# Patient Record
Sex: Male | Born: 1970
Health system: Southern US, Community
[De-identification: ages and names within clinical notes are randomized; demographics above are authoritative.]

## PROBLEM LIST (undated history)

## (undated) VITALS — BP 133/86 | HR 87 | Temp 98.0°F | Resp 18 | Ht 65.0 in | Wt 156.0 lb

## (undated) DIAGNOSIS — E785 Hyperlipidemia, unspecified: Secondary | ICD-10-CM

## (undated) DIAGNOSIS — F1010 Alcohol abuse, uncomplicated: Secondary | ICD-10-CM

## (undated) DIAGNOSIS — I255 Ischemic cardiomyopathy: Secondary | ICD-10-CM

## (undated) DIAGNOSIS — N183 Chronic kidney disease, stage 3 unspecified: Secondary | ICD-10-CM

## (undated) DIAGNOSIS — I2510 Atherosclerotic heart disease of native coronary artery without angina pectoris: Secondary | ICD-10-CM

## (undated) DIAGNOSIS — Z91199 Patient's noncompliance with other medical treatment and regimen due to unspecified reason: Secondary | ICD-10-CM

## (undated) DIAGNOSIS — Z9119 Patient's noncompliance with other medical treatment and regimen: Secondary | ICD-10-CM

## (undated) DIAGNOSIS — R4781 Slurred speech: Secondary | ICD-10-CM

## (undated) DIAGNOSIS — E119 Type 2 diabetes mellitus without complications: Secondary | ICD-10-CM

## (undated) DIAGNOSIS — I10 Essential (primary) hypertension: Secondary | ICD-10-CM

## (undated) HISTORY — DX: Atherosclerotic heart disease of native coronary artery without angina pectoris: I25.10

## (undated) HISTORY — DX: Chronic kidney disease, stage 3 (moderate): N18.3

## (undated) HISTORY — DX: Slurred speech: R47.81

## (undated) HISTORY — DX: Hyperlipidemia, unspecified: E78.5

## (undated) HISTORY — DX: Essential (primary) hypertension: I10

## (undated) HISTORY — DX: Type 2 diabetes mellitus without complications: E11.9

## (undated) HISTORY — DX: Patient's noncompliance with other medical treatment and regimen: Z91.19

## (undated) HISTORY — DX: Chronic kidney disease, stage 3 unspecified: N18.30

## (undated) HISTORY — DX: Patient's noncompliance with other medical treatment and regimen due to unspecified reason: Z91.199

## (undated) HISTORY — PX: OTHER SURGICAL HISTORY: SHX169

---

## 2004-05-08 ENCOUNTER — Ambulatory Visit: Payer: Self-pay | Admitting: Family Medicine

## 2004-08-17 ENCOUNTER — Ambulatory Visit: Payer: Self-pay | Admitting: Family Medicine

## 2004-08-24 ENCOUNTER — Ambulatory Visit: Payer: Self-pay | Admitting: Family Medicine

## 2005-06-04 ENCOUNTER — Ambulatory Visit: Payer: Self-pay | Admitting: Family Medicine

## 2005-12-26 ENCOUNTER — Ambulatory Visit: Payer: Self-pay | Admitting: Family Medicine

## 2009-01-31 ENCOUNTER — Ambulatory Visit: Payer: Self-pay | Admitting: Cardiovascular Disease

## 2009-01-31 ENCOUNTER — Ambulatory Visit: Payer: Self-pay | Admitting: Cardiology

## 2009-01-31 ENCOUNTER — Inpatient Hospital Stay (HOSPITAL_COMMUNITY): Admission: AD | Admit: 2009-01-31 | Discharge: 2009-02-03 | Payer: Self-pay | Admitting: Cardiovascular Disease

## 2009-02-01 ENCOUNTER — Encounter: Payer: Self-pay | Admitting: Cardiovascular Disease

## 2009-02-04 ENCOUNTER — Telehealth: Payer: Self-pay | Admitting: Cardiology

## 2009-02-21 DIAGNOSIS — E1159 Type 2 diabetes mellitus with other circulatory complications: Secondary | ICD-10-CM | POA: Insufficient documentation

## 2009-02-21 DIAGNOSIS — IMO0002 Reserved for concepts with insufficient information to code with codable children: Secondary | ICD-10-CM | POA: Insufficient documentation

## 2009-02-21 DIAGNOSIS — E1165 Type 2 diabetes mellitus with hyperglycemia: Secondary | ICD-10-CM

## 2009-02-21 DIAGNOSIS — I10 Essential (primary) hypertension: Secondary | ICD-10-CM | POA: Insufficient documentation

## 2009-02-22 ENCOUNTER — Ambulatory Visit: Payer: Self-pay | Admitting: Cardiology

## 2009-02-22 DIAGNOSIS — E785 Hyperlipidemia, unspecified: Secondary | ICD-10-CM | POA: Insufficient documentation

## 2009-02-22 DIAGNOSIS — I2510 Atherosclerotic heart disease of native coronary artery without angina pectoris: Secondary | ICD-10-CM | POA: Insufficient documentation

## 2009-02-22 DIAGNOSIS — I25119 Atherosclerotic heart disease of native coronary artery with unspecified angina pectoris: Secondary | ICD-10-CM | POA: Insufficient documentation

## 2009-05-25 ENCOUNTER — Ambulatory Visit: Payer: Self-pay | Admitting: Cardiology

## 2009-05-27 ENCOUNTER — Ambulatory Visit: Payer: Self-pay | Admitting: Cardiology

## 2009-05-27 ENCOUNTER — Encounter: Payer: Self-pay | Admitting: Cardiology

## 2009-05-31 ENCOUNTER — Telehealth: Payer: Self-pay | Admitting: Cardiology

## 2009-09-09 ENCOUNTER — Encounter (INDEPENDENT_AMBULATORY_CARE_PROVIDER_SITE_OTHER): Payer: Self-pay | Admitting: *Deleted

## 2009-09-16 ENCOUNTER — Emergency Department (HOSPITAL_COMMUNITY): Admission: EM | Admit: 2009-09-16 | Discharge: 2009-09-16 | Payer: Self-pay | Admitting: Emergency Medicine

## 2009-10-04 ENCOUNTER — Encounter: Admission: RE | Admit: 2009-10-04 | Discharge: 2009-10-10 | Payer: Self-pay | Admitting: Orthopaedic Surgery

## 2009-10-05 ENCOUNTER — Encounter: Payer: Self-pay | Admitting: Cardiology

## 2009-10-05 ENCOUNTER — Ambulatory Visit: Payer: Self-pay | Admitting: Cardiology

## 2009-10-07 ENCOUNTER — Ambulatory Visit: Payer: Self-pay | Admitting: Cardiology

## 2009-10-07 ENCOUNTER — Encounter: Payer: Self-pay | Admitting: Cardiology

## 2009-10-10 ENCOUNTER — Telehealth: Payer: Self-pay | Admitting: Cardiology

## 2009-10-13 ENCOUNTER — Telehealth: Payer: Self-pay | Admitting: Cardiology

## 2009-10-17 ENCOUNTER — Telehealth: Payer: Self-pay | Admitting: Cardiology

## 2009-10-19 ENCOUNTER — Encounter: Admission: RE | Admit: 2009-10-19 | Discharge: 2009-10-19 | Payer: Self-pay | Admitting: Orthopaedic Surgery

## 2009-10-20 ENCOUNTER — Ambulatory Visit (HOSPITAL_BASED_OUTPATIENT_CLINIC_OR_DEPARTMENT_OTHER): Admission: RE | Admit: 2009-10-20 | Discharge: 2009-10-20 | Payer: Self-pay | Admitting: Orthopaedic Surgery

## 2009-11-01 ENCOUNTER — Encounter: Admission: RE | Admit: 2009-11-01 | Discharge: 2009-12-22 | Payer: Self-pay | Admitting: Orthopaedic Surgery

## 2010-04-25 NOTE — Assessment & Plan Note (Signed)
Summary: [facility]   Visit Type:  Follow-up Primary Provider:  Prudy Feeler  CC:  CAD.  History of Present Illness: The patient presents for followup of his known coronary disease. Coincidentally he also injured his right knee in is going to require surgery for management of this. He has been having chest discomfort for the last 3 or 4 weeks. This is reminiscent of his previous angina. It typically happens at night. He has not been overly active but does some walking. He cannot bring on this discomfort. It occurs at rest. It will last for several minutes and go away when he takes nitroglycerin. He does not describe associated symptoms such as nausea vomiting or diaphoresis. He does not have palpitations, presyncope or syncope. He is not describing new shortness of breath.  Current Medications (verified): 1)  Simvastatin 40 Mg Tabs (Simvastatin) .... One Daily 2)  Aspirin 81 Mg  Tabs (Aspirin) .[name].. 1 P Odaily 3)  Glucovance 2.5-500 Mg Tabs (Glyburide-Metformin) .[name].. 1 By Mouth Two Times A Day 4)  Nitrostat 0.4 Mg Subl (Nitroglycerin) .... As Needed 5)  Metoprolol Tartrate 25 Mg Tabs (Metoprolol Tartrate) .... 1/2 By Mouth Two Times A Day 6)  Losartan Potassium 25 Mg Tabs (Losartan Potassium) .... One Daily  Allergies (verified): No Known Drug Allergies  Past History:  Past Medical History: Reviewed history from 02/22/2009 and no changes required. 1. Coronary artery disease status post successful percutaneous     coronary intervention and stenting of the left anterior descending     with placement of a 2.5 x 24-mm Micro-Driver bare-metal stent. 2. History of hypertension. 3. Diabetes mellitus. 4. Medication nonadherence.    Review of Systems       As stated in the HPI and negative for all other systems.   Vital Signs:  Patient profile:   40 year old male Height:      68 inches Weight:      166 pounds BMI:     25.33 Pulse rate:   63 / minute Resp:     18 per  minute BP sitting:   142 / 98  (right arm)  Vitals Entered By: Marrion Coy, CNA (October 05, 2009 10:59 AM)  Physical Exam  General:  Well developed, well nourished, in no acute distress. Head:  normocephalic and atraumatic Eyes:  PERRLA/EOM intact; conjunctiva and lids normal. Mouth:  Edentulous, gums and palate normal. Oral mucosa normal. Neck:  Neck supple, no JVD. No masses, thyromegaly or abnormal cervical nodes. Chest Wall:  no deformities or breast masses noted Lungs:  Clear bilaterally to auscultation and percussion. Abdomen:  Bowel sounds positive; abdomen soft and non-tender without masses, organomegaly, or hernias noted. No hepatosplenomegaly. Msk:  Back normal, normal gait. Muscle strength and tone normal. Extremities:  No clubbing or cyanosis. Neurologic:  Alert and oriented x 3. Skin:  Intact without lesions or rashes. Cervical Nodes:  no significant adenopathy Inguinal Nodes:  no significant adenopathy Psych:  Normal affect.   Detailed Cardiovascular Exam  Neck    Carotids: Carotids full and equal bilaterally without bruits.      Neck Veins: Normal, no JVD.    Heart    Inspection: no deformities or lifts noted.      Palpation: normal PMI with no thrills palpable.      Auscultation: regular rate and rhythm, S1, S2 without murmurs, rubs, gallops, or clicks.    Vascular    Abdominal Aorta: no palpable masses, pulsations, or audible bruits.  Femoral Pulses: normal femoral pulses bilaterally.      Pedal Pulses: normal pedal pulses bilaterally.      Radial Pulses: normal radial pulses bilaterally.      Peripheral Circulation: no clubbing, cyanosis, or edema noted with normal capillary refill.     EKG  Procedure date:  10/05/2009  Findings:      Sinus rhythm, rate 63, axis within normal limits, intervals within normal limits, old anteroseptal infarct with chronic anterior T-wave inversions  Impression & Recommendations:  Problem # 1:  CORONARY  ATHEROSCLEROSIS NATIVE CORONARY ARTERY (ICD-414.01) I am Iconcerned with his recurrent symptoms though there is some atypical nature to them as well. I cannot exclude high-grade coronary disease residual. He needs preoperative clearance. He will need to have a stress perfusion study to evaluate. Otherwise he will continue with risk reduction.  Orders: EKG w/ Interpretation (93000)  Problem # 2:  DYSLIPIDEMIA (ICD-272.4) I reviewed his lipid profile. His LDL was 35 with an HDL of 57 in March. This is an excellent regimen.  Problem # 3:  HYPERTENSION (ICD-401.9) His blood pressure is not controlled and he will continue the meds as listed. Orders: EKG w/ Interpretation (93000)  Patient Instructions: 1)  Your physician recommends that you schedule a follow-up appointment in: 12 months with Dr Antoine Poche 2)  Your physician recommends that you continue on your current medications as directed. Please refer to the Current Medication list given to you today. 3)  Your physician has requested that you have an exercise stress myoview.  For further information please visit [email or web address]  Please follow instruction sheet, as given. 4)  To be scheduled at [facility]  Appended Document: Orders Update Received request from [facility] for order for Cardiolite scheduled for 10/07/09   Clinical Lists Changes  Orders: Added new Referral order of Nuclear Med (Nuc Med) - Signed

## 2010-04-25 NOTE — Progress Notes (Signed)
Summary: test results  lm to call  Phone Note Call from Patient Call back at Home Phone [phone] Call back at [phone]   Caller: Spouse- angela  Reason for Call: Talk to Nurse, Lab or Test Results Details for Reason: echo Initial call taken by: Lorne Skeens,  May 31, 2009 9:06 AM  Follow-up for Phone Call        pt calling, request results, Migdalia Dk  May 31, 2009 11:04 AM  Home number has been disconnected and other number is not in service.  Will try again.  Sander Nephew, RN  Per pt wife calling back [phone] Lorne Skeens  June 01, 2009 9:40 AM  Called patient and left message on machine to call back for results  Sander Nephew, RN  pt returning call, Migdalia Dk  June 02, 2009 9:14 AM   Additional Follow-up for Phone Call Additional follow up Details #1::        Called patient's wife back and gave results of lab work and echo. Additional Follow-up by: Suzan Garibaldi RN

## 2010-04-25 NOTE — Progress Notes (Signed)
Summary: clearance for surgery-?[facility]  Phone Note From Other Clinic   Caller: denise office [phone] Request: Talk with Nurse Summary of Call: From office notes on 7/13 can pt be clear for surgery.  Initial call taken by: Lorne Skeens,  October 13, 2009 9:02 AM  Follow-up for Phone Call        stress test ok.  Will have to get the ok from Dr Antoine Poche as far as clearance.  Pt aware. Also spoke with Angelique Blonder at Dr. Regenia Skeeter  office. Having ACL repair w allograft and menisectomy.(not sch) fax to [phone]  Atn Brien Mates, RN, BSN  October 13, 2009 11:30 AM  Additional Follow-up for Phone Call Additional follow up Details #1::        The patient had a stress perfusion study demonstrating old anterior MI with some perinifact ischemia.  He has no new symptoms to suggest further obstructive CAD.  According to ACC/AHA guidelines he is at acceptable risk for the planned surgery.  No further testing is indicated. Additional Follow-up by: Rollene Rotunda, MD, [facility],  October 14, 2009 1:18 PM    Additional Follow-up for Phone Call Additional follow up Details #2::    I will forward clearance to Dr. Hoy Register office. Follow-up by: Sherri Rad, RN, BSN,  October 14, 2009 1:29 PM

## 2010-04-25 NOTE — Miscellaneous (Signed)
  Clinical Lists Changes  Observations: Added new observation of ECHOINTERP: Conclusions: 1. The left ventricular chamber size is normal. Mild concentric left ventricular hypertrophy is observed. There is mildly decreased left ventricular systolic function. The estimated ejection fraction is 45-50%. The apical septal, apical anterior, apical lateral and apical inferior wall segments are hypokinetic (score 2).  2. There is mild mitral regurgitation.   3. There is no evidence of aortic regurgitation. The mean gradient of the aortic valve is 3 mmHg.   4. The right ventricular global systolic function is normal.  5. There is a trace tricuspid regurgitation.   6. The right ventricular systolic pressure is calculated at 37 mmHg.   7. There is no pericardial effusion.    (05/27/2009 10:10)      Echocardiogram  Procedure date:  05/27/2009  Findings:      Conclusions: 1. The left ventricular chamber size is normal. Mild concentric left ventricular hypertrophy is observed. There is mildly decreased left ventricular systolic function. The estimated ejection fraction is 45-50%. The apical septal, apical anterior, apical lateral and apical inferior wall segments are hypokinetic (score 2).  2. There is mild mitral regurgitation.   3. There is no evidence of aortic regurgitation. The mean gradient of the aortic valve is 3 mmHg.   4. The right ventricular global systolic function is normal.  5. There is a trace tricuspid regurgitation.   6. The right ventricular systolic pressure is calculated at 37 mmHg.   7. There is no pericardial effusion.     Appended Document:  Anterior apical infarct but no report of aneurysm or apical clolt.  Plan as outlined.  Appended Document:  Patient's wife aware of results of echo.

## 2010-04-25 NOTE — Progress Notes (Signed)
Summary: test results  Phone Note Call from Patient Call back at Home Phone [phone]   Caller: Spouse Reason for Call: Talk to Nurse, Lab or Test Results Initial call taken by: Lorne Skeens,  October 10, 2009 11:14 AM  Follow-up for Phone Call        Pt will be notified today. Follow-up by: Rollene Rotunda, MD, [facility],  October 10, 2009 1:19 PM

## 2010-04-25 NOTE — Miscellaneous (Signed)
Summary: COZAAR/ECHO ORDER  Clinical Lists Changes  Medications: Removed medication of LISINOPRIL 20 MG TABS (LISINOPRIL) 1 by mouth daily Added new medication of LOSARTAN POTASSIUM 25 MG TABS (LOSARTAN POTASSIUM) ONE DAILY - Signed Rx of LOSARTAN POTASSIUM 25 MG TABS (LOSARTAN POTASSIUM) ONE DAILY;  #30 x 11;  Signed;  Entered by: Charolotte Capuchin, RN;  Authorized by: Rollene Rotunda, MD, [facility];  Method used: Electronically to [facility] [address], [place], [place], [place]  [address], Ph: [phone], Fax: [phone] Observations: Added new observation of PI CARDIO: Your physician recommends that you schedule a follow-up appointment in: 4 MONTHS IN THE [place] OFFICE Your physician has recommended you make the following change in your medication: STOP LISINOPRIL AND START COZAAR 25 MG A DAY Your physician has requested that you have an echocardiogram.  Echocardiography is a painless test that uses sound waves to create images of your heart. It provides your doctor with information about the size and shape of your heart and how well your heart's chambers and valves are working.  This procedure takes approximately one hour. There are no restrictions for this procedure.  YOUR TEST IS SCHEDULED FOR FRIDAY 05/27/2009     AT [facility].  ARRIVE AT 8:30AM FOR A 9 AM APPOINTMENT.  REPORT  TO ADMITTING TO CHECK IN.  YOU   ALSO NEED LAB WORK THIS DAY.  DO NOT EAT OR DRINK THIS MORNING. (05/25/2009 11:31)    Prescriptions: LOSARTAN POTASSIUM 25 MG TABS (LOSARTAN POTASSIUM) ONE DAILY  #30 x 11   Entered by:   Charolotte Capuchin, RN   Authorized by:   Rollene Rotunda, MD, [facility]   Signed by:   Charolotte Capuchin, RN on 05/25/2009   Method used:   Electronically to        [facility]  [address] (retail)       [address]       [place], [place]  [address]       Ph: [phone]       Fax: [phone]   RxID:   [id number]    Patient Instructions: 1)   Your physician recommends that you schedule a follow-up appointment in: 4 MONTHS IN THE [place] OFFICE 2)  Your physician has recommended you make the following change in your medication: STOP LISINOPRIL AND START COZAAR 25 MG A DAY 3)  Your physician has requested that you have an echocardiogram.  Echocardiography is a painless test that uses sound waves to create images of your heart. It provides your doctor with information about the size and shape of your heart and how well your heart's chambers and valves are working.  This procedure takes approximately one hour. There are no restrictions for this procedure.  YOUR TEST IS SCHEDULED FOR FRIDAY 05/27/2009     AT [facility].  ARRIVE AT 8:30AM FOR A 9 AM APPOINTMENT.  REPORT  TO ADMITTING TO CHECK IN.  YOU   ALSO NEED LAB WORK THIS DAY.  DO NOT EAT OR DRINK THIS MORNING.

## 2010-04-25 NOTE — Miscellaneous (Signed)
Summary: Orders Update  Clinical Lists Changes  Orders: Added new Referral order of Echocardiogram (Echo) - Signed Added new Service order of EKG w/ Interpretation (93000) - Signed

## 2010-04-25 NOTE — Progress Notes (Signed)
Summary: pt needs sugical clearence  Phone Note From Other Clinic Call back at [phone]   Caller: Olegario Messier from [facility] Request: Talk with Nurse, Talk with Provider Summary of Call: pt is having a ACL reconstruction on 7/28 and needs clearence Initial call taken by: Omer Jack,  October 17, 2009 3:32 PM  Follow-up for Phone Call        note faxed Deliah Goody, RN  October 17, 2009 4:04 PM

## 2010-04-25 NOTE — Assessment & Plan Note (Signed)
Summary: [facility]   Visit Type:  Follow-up Primary Provider:  Ardeen Garland, MD  CC:  CAD.  History of Present Illness: The patient presents for followup after an anterior myocardial infarction. Since I last saw him he has had one episode of chest discomfort that required nitroglycerin. This was about a month ago. Otherwise he's been able to do today including lifting and sweeping it out bringing on any chest pressure, neck or arm discomfort. He's had no new shortness of breath and denies any PND or orthopnea. He's had no palpitations, presyncope or syncope. He does report that his blood sugars have been elevated and somewhat difficult to control.  Of note he has had a dry nonproductive cough persistently.  Current Medications (verified): 1)  Simvastatin 40 Mg Tabs (Simvastatin) .... One Daily 2)  Aspirin 81 Mg  Tabs (Aspirin) .[name].. 1 P Odaily 3)  Metformin Hcl 500 Mg Tabs (Metformin Hcl) .[name].. 1 By Mouth Two Times A Day 4)  Nitrostat 0.4 Mg Subl (Nitroglycerin) .... As Needed 5)  Metoprolol Tartrate 25 Mg Tabs (Metoprolol Tartrate) .... 1/2 By Mouth Two Times A Day 6)  Lisinopril 20 Mg Tabs (Lisinopril) .[name].. 1 By Mouth Daily  Allergies (verified): No Known Drug Allergies  Past History:  Past Medical History: Reviewed history from 02/22/2009 and no changes required. 1. Coronary artery disease status post successful percutaneous     coronary intervention and stenting of the left anterior descending     with placement of a 2.5 x 24-mm Micro-Driver bare-metal stent. 2. History of hypertension. 3. Diabetes mellitus. 4. Medication nonadherence.    Review of Systems       As stated in the HPI and negative for all other systems.   Vital Signs:  Patient profile:   40 year old male Height:      68 inches Weight:      160 pounds BMI:     24.42 Pulse rate:   70 / minute Resp:     16 per minute BP sitting:   122 / 88  (right arm)  Vitals Entered By: Marrion Coy, CNA  (May 25, 2009 10:27 AM)  Physical Exam  General:  Well developed, well nourished, in no acute distress. Head:  normocephalic and atraumatic Eyes:  PERRLA/EOM intact; conjunctiva and lids normal. Mouth:  Edentulous, gums and palate normal. Oral mucosa normal. Neck:  Neck supple, no JVD. No masses, thyromegaly or abnormal cervical nodes. Chest Wall:  no deformities or breast masses noted Lungs:  Clear bilaterally to auscultation and percussion. Abdomen:  Bowel sounds positive; abdomen soft and non-tender without masses, organomegaly, or hernias noted. No hepatosplenomegaly. Msk:  Back normal, normal gait. Muscle strength and tone normal. Extremities:  No clubbing or cyanosis. Neurologic:  Alert and oriented x 3. Skin:  Intact without lesions or rashes. Psych:  Normal affect.   Detailed Cardiovascular Exam  Neck    Carotids: Carotids full and equal bilaterally without bruits.      Neck Veins: Normal, no JVD.    Heart    Inspection: no deformities or lifts noted.      Palpation: normal PMI with no thrills palpable.      Auscultation: regular rate and rhythm, S1, S2 without murmurs, rubs, gallops, or clicks.    Vascular    Abdominal Aorta: no palpable masses, pulsations, or audible bruits.      Femoral Pulses: normal femoral pulses bilaterally.      Pedal Pulses: normal pedal pulses bilaterally.  Radial Pulses: normal radial pulses bilaterally.      Peripheral Circulation: no clubbing, cyanosis, or edema noted with normal capillary refill.     EKG  Procedure date:  05/25/2009  Findings:      sinus rhythm, rate 70, left axis deviation, anteroseptal infarct with persistent ST elevation and T-wave inversions  Impression & Recommendations:  Problem # 1:  CORONARY ATHEROSCLEROSIS NATIVE CORONARY ARTERY (ICD-414.01) He has had no recent symptoms. However, he continues to have a markedly abnormal EKG. I'm concerned about his anterior wall and possible aneurysm formation. I  will check an echocardiogram and continue with aggressive risk reduction.  Of note his cough is likely to be related to ACE inhibitors. I will switch him to Cozaar 25 mg daily.  Problem # 2:  DM (ICD-250.00) I will check a hemoglobin A1c and refer him back to his primary physician.  Problem # 3:  HYPERTENSION (ICD-401.9) His blood pressure is controlled. I will make the switch as described above.  Problem # 4:  DYSLIPIDEMIA (ICD-272.4) I will send him for a fasting lipid profile. With his young age I will try to maintain an LDL

## 2010-04-25 NOTE — Letter (Signed)
Summary: Appointment - Reschedule  [facility], Main Office  [address]   [place], [place] [address]   Phone: [phone]  Fax: [phone]     September 09, 2009 MRN: [id number]   [facility] [address] [place], [place]  [address]   Dear Mr. MONESTIME,   Due to a change in our office schedule, your appointment on June 22,2011 at  11:15              must be changed.  It is very important that we reach you to reschedule this appointment. We look forward to participating in your health care needs. Please contact us at the number listed above at your earliest convenience to reschedule this appointment.     Sincerely, Lorne Skeens [facility] Scheduling Team

## 2010-05-03 ENCOUNTER — Encounter (HOSPITAL_COMMUNITY): Payer: Self-pay

## 2010-05-03 ENCOUNTER — Inpatient Hospital Stay (HOSPITAL_COMMUNITY)
Admission: EM | Admit: 2010-05-03 | Discharge: 2010-05-05 | DRG: 313 | Disposition: A | Discharge status: Home / Self Care | Payer: Medicaid Other | Attending: Internal Medicine | Admitting: Internal Medicine

## 2010-05-03 ENCOUNTER — Emergency Department (HOSPITAL_COMMUNITY): Payer: Medicaid Other

## 2010-05-03 DIAGNOSIS — I10 Essential (primary) hypertension: Secondary | ICD-10-CM | POA: Diagnosis present

## 2010-05-03 DIAGNOSIS — Z91199 Patient's noncompliance with other medical treatment and regimen due to unspecified reason: Secondary | ICD-10-CM

## 2010-05-03 DIAGNOSIS — I2510 Atherosclerotic heart disease of native coronary artery without angina pectoris: Secondary | ICD-10-CM | POA: Diagnosis present

## 2010-05-03 DIAGNOSIS — Z9861 Coronary angioplasty status: Secondary | ICD-10-CM

## 2010-05-03 DIAGNOSIS — Z9119 Patient's noncompliance with other medical treatment and regimen: Secondary | ICD-10-CM

## 2010-05-03 DIAGNOSIS — R0789 Other chest pain: Principal | ICD-10-CM | POA: Diagnosis present

## 2010-05-03 DIAGNOSIS — E7800 Pure hypercholesterolemia, unspecified: Secondary | ICD-10-CM | POA: Diagnosis present

## 2010-05-03 DIAGNOSIS — I252 Old myocardial infarction: Secondary | ICD-10-CM

## 2010-05-03 DIAGNOSIS — IMO0001 Reserved for inherently not codable concepts without codable children: Secondary | ICD-10-CM | POA: Diagnosis present

## 2010-05-04 ENCOUNTER — Encounter: Payer: Self-pay | Admitting: Cardiology

## 2010-05-04 DIAGNOSIS — I059 Rheumatic mitral valve disease, unspecified: Secondary | ICD-10-CM

## 2010-05-04 DIAGNOSIS — R079 Chest pain, unspecified: Secondary | ICD-10-CM

## 2010-05-04 LAB — DIFFERENTIAL
Basophils Relative: 0 % (ref 0–1)
Eosinophils Absolute: 0.1 10*3/uL (ref 0.0–0.7)
Eosinophils Relative: 2 % (ref 0–5)
Monocytes Absolute: 0.7 10*3/uL (ref 0.1–1.0)
Neutro Abs: 5.1 10*3/uL (ref 1.7–7.7)
Neutrophils Relative %: 58 % (ref 43–77)

## 2010-05-04 LAB — BASIC METABOLIC PANEL
BUN: 15 mg/dL (ref 6–23)
Chloride: 100 mEq/L (ref 96–112)
Creatinine, Ser: 1.42 mg/dL (ref 0.4–1.5)
GFR calc Af Amer: >60 mL/min (ref >60)
Glucose, Bld: 223 mg/dL — ABNORMAL HIGH (ref 70–99)
Potassium: 4 mEq/L (ref 3.5–5.1)

## 2010-05-04 LAB — CBC
HCT: 39.9 % (ref 39.0–52.0)
Hemoglobin: 14.4 g/dL (ref 13.0–17.0)
MCH: 27.7 pg (ref 26.0–34.0)
MCV: 76.9 fL — ABNORMAL LOW (ref 78.0–100.0)
RDW: 12.4 % (ref 11.5–15.5)
WBC: 8.9 10*3/uL (ref 4.0–10.5)

## 2010-05-04 LAB — GLUCOSE, CAPILLARY
Glucose-Capillary: 202 mg/dL — ABNORMAL HIGH (ref 70–99)
Glucose-Capillary: 177 mg/dL — ABNORMAL HIGH (ref 70–99)
Glucose-Capillary: 275 mg/dL — ABNORMAL HIGH (ref 70–99)

## 2010-05-04 LAB — HEMOGLOBIN A1C: Hgb A1c MFr Bld: 9.3 % — ABNORMAL HIGH (ref <5.7)

## 2010-05-04 LAB — LIPID PANEL
Cholesterol: 183 mg/dL (ref 0–200)
Triglycerides: 197 mg/dL — ABNORMAL HIGH (ref <150)
VLDL: 39 mg/dL (ref 0–40)

## 2010-05-04 LAB — POCT CARDIAC MARKERS
CKMB, poc: 5.3 ng/mL (ref 1.0–8.0)
Troponin i, poc: <0.05 ng/mL (ref 0.00–0.09)

## 2010-05-04 LAB — CARDIAC PANEL(CRET KIN+CKTOT+MB+TROPI)
CK, MB: 3.9 ng/mL (ref 0.3–4.0)
Total CK: 128 U/L (ref 7–232)

## 2010-05-04 LAB — D-DIMER, QUANTITATIVE: D-Dimer, Quant: 0.22 ug/mL-FEU (ref 0.00–0.48)

## 2010-05-05 ENCOUNTER — Inpatient Hospital Stay (HOSPITAL_COMMUNITY): Payer: Medicaid Other

## 2010-05-05 ENCOUNTER — Encounter (HOSPITAL_COMMUNITY): Payer: Self-pay

## 2010-05-05 LAB — GLUCOSE, CAPILLARY
Glucose-Capillary: 149 mg/dL — ABNORMAL HIGH (ref 70–99)
Glucose-Capillary: 172 mg/dL — ABNORMAL HIGH (ref 70–99)
Glucose-Capillary: 232 mg/dL — ABNORMAL HIGH (ref 70–99)

## 2010-05-05 MED ORDER — TECHNETIUM TC 99M TETROFOSMIN IV KIT
30.0000 | PACK | Freq: Once | INTRAVENOUS | Status: AC | PRN
  Administered 2010-05-05: 30 via INTRAVENOUS

## 2010-05-05 MED ORDER — TECHNETIUM TC 99M TETROFOSMIN IV KIT
10.0000 | PACK | Freq: Once | INTRAVENOUS | Status: AC | PRN
  Administered 2010-05-05: 10.5 via INTRAVENOUS

## 2010-05-07 NOTE — Discharge Summary (Signed)
  [name], [place]                ACCOUNT NO.:  [id number]  MEDICAL RECORD NO.:  [id number]           PATIENT TYPE:  I  LOCATION:  A335                          FACILITY:  [facility]  PHYSICIAN:  Wilson Singer, M.D.DATE OF BIRTH:  [birth year]  DATE OF ADMISSION:  05/03/2010 DATE OF DISCHARGE:  02/10/2012LH                              DISCHARGE SUMMARY   PRIMARY CARE PHYSICIAN:  Delaney Meigs, MD  FINAL DISCHARGE DIAGNOSIS:  Atypical chest pain negative for ischemia on stress testing.  CONDITION ON DISCHARGE:  Stable.  MEDICATIONS ON DISCHARGE: 1. Nitroglycerin sublingual 0.4 mg 1 tablet p.r.n. 2. Glipizide XL 2.5 mg daily. 3. Aspirin 81 mg daily. 4. Metformin 500 mg b.i.d. 5. Plavix 75 mg daily. 6. Metoprolol 25 mg half a tablet b.i.d. 7. Cozaar 50 mg daily.  HISTORY:  This 40 year old man who has a known history of myocardial infarction in November 2010, presented with chest pain.  The pain was central in nature and seemed somewhat atypical.  Please see initial history and physical examination done by Dr. Marguerite Olea.  HOSPITAL PROGRESS:  The patient was admitted and seen by Cardiology.  He also went echocardiogram which showed ejection fraction at the lower end of normal 45-50%.  Serial cardiac enzymes were negative.  He was seen by cardiologist, Dr. Diona Browner and he underwent stress testing.  Today, the stress test was done and it was negative.  Interestingly, his hemoglobin A1c was significantly elevated at 9.3%.  On physical examination today, he looks well.  He has no further chest pain, temperature 97.9, blood pressure 115/52, pulse 95, saturation 93% on room air.  Heart sounds are present and normal without murmurs.  Lung fields are clear.  DISPOSITION:  The patient is stable to be discharged home with no new medications added.  I think he needs followup for his diabetes in view of his very abnormally raised hemoglobin A1c of 9.3%.  The control of his diabetes  will obviously reduce his risk for further cardiac events. He will also need to follow up with Dr. Dietrich Pates, cardiology in the next 1-2 weeks.     Wilson Singer, M.D.     NCG/MEDQ  D:  05/05/2010  T:  05/06/2010  Job:  [id number]  cc:   Gerrit Friends. Dietrich Pates, MD, [facility] [address] [place], [place] [address]  Electronically Signed by Lilly Cove M.D. on 05/07/2010 06:50:52 AM

## 2010-05-07 NOTE — H&P (Signed)
[name], [name] NO.:  [id number]  MEDICAL RECORD NO.:  [id number]           PATIENT TYPE:  E  LOCATION:  [facility]                          FACILITY:  [facility]  PHYSICIAN:  Houston Siren, MD           DATE OF BIRTH:  [birth year]  DATE OF ADMISSION:  05/03/2010 DATE OF DISCHARGE:  LH                             HISTORY & PHYSICAL   PRIMARY CARE PHYSICIAN:  Delaney Meigs, M.D.  CARDIOLOGIST:  Rollene Rotunda, MD, [facility].  ADVANCE DIRECTIVES:  Full code.  REASON FOR ADMISSION:  Chest pain.  HISTORY OF PRESENT ILLNESS:  This is a 40 year old male with history of ST elevation MI about 1 year ago, status post bare metal stent to the mid-LAD, hypertension, diabetes, medical noncompliance, presents to [facility] Emergency Room with 1-1/2 hours of substernal chest pain without shortness of breath.  He had some mild nausea but no diaphoresis and pain is nonpleuritic.  He has no abdominal pain and no lightheadedness.  No black stool or bloody stool.  While he was in the emergency room, he was pain free.  He had stopped all his medications including aspirin and has just been very noncompliant.  His family stated they have been pleading him to take his medications but he ignored them all.  Workup in emergency room included a chest x-ray which only showed bronchitic changes.  EKG shows nonspecific ST-T changes.  It should be noted that he has residual ST elevations on the anterior leads but much less compared to the one during his STEMI in November 2010.  His cardiac enzymes were negative and he has a normal CBC.  He stated he is generally very active without any chest pain.  PAST MEDICAL HISTORY:  As above.  ALLERGIES:  No known drug allergies.  CURRENT MEDICATIONS:  He is unable to tell but from the old record he was on Crestor 40 mg per day, Lopressor, metformin, and aspirin.  REVIEW OF SYSTEMS:  Otherwise unremarkable.  PHYSICAL EXAMINATION:  VITAL SIGNS:  Blood  pressure 145/100, pulse of 80, respiratory rate of 20, temperature 97.8. GENERAL:  He is alert and oriented and is in no apparent distress.  He has speech impediment.  He has facial symmetry. HEENT:  Sclera is nonicteric.  I did not hear any carotid bruit. NECK:  Supple. CARDIAC:  S1-S2 regular with frequent premature beats. LUNGS:  Clear with no rales, wheezes or any evidence of consolidation. ABDOMEN:  Soft, nondistended, nontender.  Bowel sounds present.  No ascites.  No pulsatile mass. EXTREMITIES:  No edema.  Good pulses bilaterally. SKIN:  Warm and dry. NEUROLOGIC:  Nonfocal. PSYCHIATRIC:  Unremarkable as well.  OBJECTIVE FINDINGS:  EKG shows nonspecific ST throughout the precordial leads in sinus rhythm, rate about 80.  Serum sodium 134, potassium 4.0, creatinine 1.4, glucose 223.  Troponin less than 0.05.  Myoglobin 88. White count of 8.9, hemoglobin of 14.4, platelet count 282,000.  Chest x- ray showed only bronchitic changes.  IMPRESSION:  This is a 39 year old male with known coronary artery disease, status post ST elevation myocardial infarction  and bare metal stent to the left anterior descending artery 1 year ago, presented with atypical chest pain.  He has been very noncompliant and had stopped all his medications including the aspirin.  Currently, he is pain-free.  His first cardiac enzyme sets was negative.  We will admit him to telemetry and continue to rule out.  We will restart his aspirin.  I would like to put him on Lopressor, ACE inhibitor, and statin.  We will give him Crestor 40 mg per day along with lisinopril at 20 mg per day and Lopressor 25 mg b.i.d.  We will put 1 inch of nitro paste on his chest as well.  We will do serial EKGs and troponins.  He will likely need a stress test once he ruled out.  I did spend quite some time explaining to him about the importance of being compliant to his medication, especially if he does not wish to have his life  truncated.  He is a full code.     Houston Siren, MD     PL/MEDQ  D:  05/04/2010  T:  05/04/2010  Job:  [id number]  Electronically Signed by Houston Siren  on 05/07/2010 06:24:51 AM

## 2010-05-07 NOTE — Consult Note (Signed)
NAMEGERARD, [name] NO.:  [id number]  MEDICAL RECORD NO.:  [id number]           PATIENT TYPE:  I  LOCATION:  A335                          FACILITY:  [facility]  PHYSICIAN:  Jonelle Sidle, MD DATE OF BIRTH:  Nov 18, 1970  DATE OF CONSULTATION: DATE OF DISCHARGE:                                CONSULTATION   PRIMARY CARDIOLOGIST:  Rollene Rotunda, MD, [facility].  REQUESTING PHYSICIAN:  [facility]  PRIMARY CARE PHYSICIAN:  Delaney Meigs, M.D.  REASON FOR CONSULTATION:  Chest pain.  HISTORY OF PRESENT ILLNESS:  [name] is a 40 year old male with a history of coronary artery disease status post anterior wall myocardial infarction back in November 2010 resulting in bare-metal stent placement to the mid left anterior descending.  Additional problems include type 2 diabetes mellitus, hypertension, and hyperlipidemia.  Record review finds that Mr. [name] has not had regular followup with Dr. Antoine Poche, last seen in 2010.  He reports being in his usual state of health, working in a Scientist, forensic on a daily basis, without any clear-cut exertional chest pain or unusual shortness of breath.  He now presents to the hospital with recent onset of chest pain described as a dull discomfort, also as a tightness, that began at rest, and lasted for approximately 2 hours.  There was no obvious precipitant or relieving factor.  He has not been on any regular medications for "a long time" stating that he did not specifically feel any better while taking them.  Also prior documented history of noncompliance based on chart review.  He did not have any nitroglycerin at home to take.  By the time he arrived at the emergency department, he was symptom free. He is now admitted for further monitoring and observation.  His electrocardiogram is abnormal, consistent with a previous anterior wall infarct with residual ST-segment elevations in leads V1  through V6, less prominent compared to prior tracing from 2010.  Cardiac markers are noted to be reassuring initially, with single set showing a troponin-I less than 0.05 and a total CK of 168 with MB relative index of 3.0. Additional sets are pending.  He has had no recurrent symptoms under observation.  We are asked to assist with his evaluation.  ALLERGIES:  No known drug allergies.  MEDICATIONS:  As of his last office visit with Dr. Antoine Poche in November 2010 included, 1. Crestor 20 mg p.o. daily. 2. Effient 10 mg p.o. daily. 3. Aspirin 81 mg p.o. daily. 4. Metformin 500 mg p.o. daily. 5. Sublingual nitroglycerin 0.4 mg p.r.n. 6. Lopressor 25 mg one-half tablet p.o. b.i.d.  Present medications include, 1. Aspirin 325 mg p.o. daily. 2. Lovenox 40 mg subcu daily. 3. NovoLog sliding scale. 4. Zestril 20 mg p.o. daily. 5. Lopressor 25 mg p.o. b.i.d. 6. Crestor 40 mg p.o. daily. 7. He was also on nitroglycerin paste, 1 inch q.6 hours, however this     has been discontinued.  PAST MEDICAL HISTORY:  As outlined above.  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  SOCIAL HISTORY:  The patient is married, has three sons.  Works in a Scientist, forensic on a daily basis.  No reported tobacco or alcohol use.  FAMILY HISTORY:  Reviewed and noncontributory for obvious premature cardiovascular disease in first-degree relatives based on his recollection.  Some history of diabetes mellitus reported.  REVIEW OF SYSTEMS:  Described above.  On a typical basis, [name] denies any exertional chest pain or breathlessness beyond NYHA class II. He works "every day" and denies any major functional limitation.  He does have some right knee pain at times, no frank claudication.  Denies any palpitations or syncope.  His son indicates that the patient has had some cough, possibly  "bronchitis" although no other active symptoms.  He reports a stable appetite.  No lower extremity edema, orthopnea, or PND. Otherwise reviewed and negative except as outlined.  PHYSICAL EXAMINATION:  VITAL SIGNS:  Temperature is 98.2 degrees, heart rate 57 to 67 in sinus rhythm by telemetry, respirations 20, blood pressure is 115/75, ox saturation is 95% on 2 liters nasal cannula. GENERAL:  This is a disheveled appearing male, otherwise normally nourished appearing in no acute distress without active chest pain. HEENT:  Conjunctivae and lids are normal.  Oropharynx with poor dentition. NECK:  Supple.  No elevated JVP or carotid bruits.  No thyromegaly is noted. LUNGS:  Exhibit diminished breath sounds, but no wheezing.  Nonlabored at rest. CARDIAC:  A regular rate and rhythm.  No pericardial rub or significant systolic murmur.  No S3. ABDOMEN:  Soft, nontender.  Bowel sounds present. EXTREMITIES:  Exhibit no significant pitting edema.  Distal pulses are 1- 2+. SKIN:  Warm and dry. MUSCULOSKELETAL:  No kyphosis is noted. NEUROPSYCHIATRIC:  The patient is alert and oriented x3.  Seems to have some type of speech impediment.  Otherwise, affect is grossly appropriate.  LABORATORY DATA:  WBC is 8.9, hemoglobin 14.4, hematocrit 39.9, platelets 282.  D-dimer 0.22.  Sodium 134, potassium 4.0, chloride 100, bicarb 23, glucose 223, BUN 15, creatinine 1.4, CK 168, CK-MB 5.1, troponin-I 0.01, myoglobin 91.8.  Chest x-ray from May 04, 2010 reports bronchitic changes with bibasilar atelectasis, otherwise no acute findings.  Cardiac catheterization from November 2010 revealed occlusion in the mid left anterior descending resulting in placement of a bare-metal stent. Otherwise there was mild of 20% stenosis within the circumflex and approximately 20% stenosis in the proximal RCA.  The left ventricular ejection fraction was 50%.  Echocardiogram from November 2010 demonstrated a left  ventricular ejection fraction of 50% to 55% with akinesis of the apex.  IMPRESSION: 1. Chest pain syndrome, presently resolved, concerning for possible     unstable angina in light of the patient's cardiac history,     noncompliance with medications, and cardiac risk factors that     remain ongoing.  ECG is abnormal consistent with a previous     anterior wall infarct, however, his cardiac markers at this point     are overall reassuring, and he has had no recurrent symptoms under     observation.  He has had no regular cardiology followup since 2010     based on record review, nor followup ischemic testing. 2. History of hypertension. 3. History of hyperlipidemia. 4. History of type 2 diabetes mellitus. 5. Medication noncompliance, duration not entirely clear, but     presumably long-term based on the patient's description.  RECOMMENDATIONS:  Situation was discussed with the patient and his family, all present today.  Would  continue observation today with full set of cycled cardiac markers and followup ECG for the morning.  A 2-D echocardiogram will also be arranged to reassess left ventricular systolic function.  If he remains clinically stable, with normal troponin-I levels, and his LVEF has not changed significantly since 2010, then we will anticipate a follow-up exercise Myoview on reinstituted medications for tomorrow morning.  Otherwise, if higher risk features are noted in clinical followup and with objective testing arranged, a cardiac catheterization can be discussed.  Our service will follow with you.     Jonelle Sidle, MD     SGM/MEDQ  D:  05/04/2010  T:  05/04/2010  Job:  [id number]  cc:   [facility]  Rollene Rotunda, MD, [facility] [address]  [address] [place] [address]  Delaney Meigs, M.D. Fax: [phone]  Electronically Signed by Nona Dell MD on 05/04/2010 05:38:04 PM

## 2010-05-17 NOTE — Consult Note (Signed)
Summary: Consultation Report  Consultation Report   Imported By: Earl Many 05/09/2010 16:38:10  _____________________________________________________________________  External Attachment:    Type:   Image     Comment:   External Document

## 2010-05-19 ENCOUNTER — Encounter (INDEPENDENT_AMBULATORY_CARE_PROVIDER_SITE_OTHER): Payer: Self-pay | Admitting: *Deleted

## 2010-05-19 ENCOUNTER — Encounter: Payer: Medicaid Other | Admitting: Adult Health

## 2010-05-23 NOTE — Letter (Signed)
Summary: Appointment - Missed  [facility] [place]  [address] [place] [address]   Phone: [phone]  Fax: [phone]     May 19, 2010 MRN: [id number]   [facility] [place] [address] [place], [place]  [address]   Dear Mr. MCCARLEY,  Our records indicate you missed your appointment on     05/19/10 Joni Reining NP                It is very important that we reach you to reschedule this appointment. We look forward to participating in your health care needs. Please contact us at the number listed above at your earliest convenience to reschedule this appointment.     Sincerely,    Glass blower/designer

## 2010-06-10 LAB — BASIC METABOLIC PANEL
CO2: 27 mEq/L (ref 19–32)
Chloride: 107 mEq/L (ref 96–112)
GFR calc non Af Amer: >60 mL/min (ref >60)
Glucose, Bld: 198 mg/dL — ABNORMAL HIGH (ref 70–99)
Potassium: 4 mEq/L (ref 3.5–5.1)
Sodium: 141 mEq/L (ref 135–145)

## 2010-06-10 LAB — GLUCOSE, CAPILLARY
Glucose-Capillary: 112 mg/dL — ABNORMAL HIGH (ref 70–99)
Glucose-Capillary: 122 mg/dL — ABNORMAL HIGH (ref 70–99)

## 2010-06-28 LAB — GLUCOSE, CAPILLARY
Glucose-Capillary: 130 mg/dL — ABNORMAL HIGH (ref 70–99)
Glucose-Capillary: 153 mg/dL — ABNORMAL HIGH (ref 70–99)
Glucose-Capillary: 164 mg/dL — ABNORMAL HIGH (ref 70–99)
Glucose-Capillary: 196 mg/dL — ABNORMAL HIGH (ref 70–99)
Glucose-Capillary: 198 mg/dL — ABNORMAL HIGH (ref 70–99)
Glucose-Capillary: 231 mg/dL — ABNORMAL HIGH (ref 70–99)
Glucose-Capillary: 275 mg/dL — ABNORMAL HIGH (ref 70–99)

## 2010-06-28 LAB — COMPREHENSIVE METABOLIC PANEL
ALT: 36 U/L (ref 0–53)
AST: 70 U/L — ABNORMAL HIGH (ref 0–37)
Albumin: 3.5 g/dL (ref 3.5–5.2)
Alkaline Phosphatase: 53 U/L (ref 39–117)
Potassium: 3.2 mEq/L — ABNORMAL LOW (ref 3.5–5.1)
Sodium: 130 mEq/L — ABNORMAL LOW (ref 135–145)
Total Protein: 5.8 g/dL — ABNORMAL LOW (ref 6.0–8.3)

## 2010-06-28 LAB — CBC
HCT: 37.3 % — ABNORMAL LOW (ref 39.0–52.0)
Hemoglobin: 13.1 g/dL (ref 13.0–17.0)
MCHC: 35.3 g/dL (ref 30.0–36.0)
MCV: 82.4 fL (ref 78.0–100.0)
Platelets: 196 10*3/uL (ref 150–400)
Platelets: 211 10*3/uL (ref 150–400)
RDW: 12.5 % (ref 11.5–15.5)

## 2010-06-28 LAB — BASIC METABOLIC PANEL
BUN: 6 mg/dL (ref 6–23)
BUN: 8 mg/dL (ref 6–23)
CO2: 26 mEq/L (ref 19–32)
Calcium: 8.9 mg/dL (ref 8.4–10.5)
Chloride: 103 mEq/L (ref 96–112)
Creatinine, Ser: 1.41 mg/dL (ref 0.4–1.5)
GFR calc Af Amer: >60 mL/min (ref >60)
Glucose, Bld: 223 mg/dL — ABNORMAL HIGH (ref 70–99)
Potassium: 3.6 mEq/L (ref 3.5–5.1)

## 2010-06-28 LAB — POCT I-STAT, CHEM 8
Chloride: 100 mEq/L (ref 96–112)
Glucose, Bld: 257 mg/dL — ABNORMAL HIGH (ref 70–99)
HCT: 38 % — ABNORMAL LOW (ref 39.0–52.0)
Potassium: 3.4 mEq/L — ABNORMAL LOW (ref 3.5–5.1)

## 2010-06-28 LAB — CULTURE, BLOOD (ROUTINE X 2)

## 2010-06-28 LAB — POCT I-STAT 3, ART BLOOD GAS (G3+)
Acid-base deficit: 5 mmol/L — ABNORMAL HIGH (ref 0.0–2.0)
pCO2 arterial: 45.3 mmHg — ABNORMAL HIGH (ref 35.0–45.0)
pO2, Arterial: 273 mmHg — ABNORMAL HIGH (ref 80.0–100.0)

## 2010-06-28 LAB — LIPID PANEL
HDL: 33 mg/dL — ABNORMAL LOW (ref >39)
Triglycerides: 116 mg/dL (ref <150)
VLDL: 23 mg/dL (ref 0–40)

## 2010-06-28 LAB — CARDIAC PANEL(CRET KIN+CKTOT+MB+TROPI)
CK, MB: 40.6 ng/mL — ABNORMAL HIGH (ref 0.3–4.0)
Relative Index: 5.3 — ABNORMAL HIGH (ref 0.0–2.5)
Relative Index: 7.2 — ABNORMAL HIGH (ref 0.0–2.5)
Total CK: 1735 U/L — ABNORMAL HIGH (ref 7–232)
Troponin I: 2.96 ng/mL (ref 0.00–0.06)
Troponin I: >100 ng/mL (ref 0.00–0.06)

## 2010-06-28 LAB — DIFFERENTIAL
Basophils Relative: 0 % (ref 0–1)
Eosinophils Absolute: 0 10*3/uL (ref 0.0–0.7)
Monocytes Absolute: 0.6 10*3/uL (ref 0.1–1.0)
Monocytes Relative: 5 % (ref 3–12)

## 2010-06-28 LAB — HEMOGLOBIN A1C: Hgb A1c MFr Bld: 9 % — ABNORMAL HIGH (ref 4.6–6.1)

## 2010-06-28 LAB — PROTIME-INR: Prothrombin Time: 21.8 seconds — ABNORMAL HIGH (ref 11.6–15.2)

## 2010-08-11 NOTE — Assessment & Plan Note (Signed)
[facility]                                 ON-CALL NOTE   KHARSON, RASMUSSON                         MRN:          [id number]  DATE:02/05/2009                            DOB:          1970-12-20    PRIMARY CARDIOLOGIST:  Rollene Rotunda, MD, [facility]   PROBLEM:  The patient's sister contacted me regarding getting samples of  prasugrel (which I had to determine from the discharge summary note.)  She was instructed to come to our [place] office today (Saturday) to  pick up samples.  She actually called from outside the office entrance.  I suggested to her that I would not able to provide the samples, at this  point in time, but to contact our office first thing in the morning on  Monday.  We will hopefully try to resolve this issue for her.  Her  brother is to  otherwise continue on all of his current medications,  including full dose aspirin.  Also, I advised the sister to contact me  later today, if she has any further questions or concerns.     Gene Serpe, PA-C  Electronically Signed    GS/MedQ  DD: 02/05/2009  DT: 02/06/2009  Job #: [id number]

## 2010-10-03 ENCOUNTER — Encounter: Payer: Self-pay | Admitting: Cardiology

## 2010-10-04 ENCOUNTER — Ambulatory Visit: Payer: Medicaid Other | Admitting: Cardiology

## 2011-01-05 ENCOUNTER — Encounter (HOSPITAL_COMMUNITY): Payer: Medicaid Other

## 2011-08-20 ENCOUNTER — Emergency Department (HOSPITAL_COMMUNITY)
Admission: EM | Admit: 2011-08-20 | Discharge: 2011-08-21 | Disposition: A | Discharge status: Other Acute Inpatient Hospital | Payer: 59 | Source: Home / Self Care | Attending: Emergency Medicine | Admitting: Emergency Medicine

## 2011-08-20 ENCOUNTER — Encounter (HOSPITAL_COMMUNITY): Payer: Self-pay | Admitting: Emergency Medicine

## 2011-08-20 DIAGNOSIS — IMO0002 Reserved for concepts with insufficient information to code with codable children: Secondary | ICD-10-CM | POA: Insufficient documentation

## 2011-08-20 DIAGNOSIS — I10 Essential (primary) hypertension: Secondary | ICD-10-CM | POA: Insufficient documentation

## 2011-08-20 DIAGNOSIS — R45851 Suicidal ideations: Secondary | ICD-10-CM

## 2011-08-20 DIAGNOSIS — W2209XA Striking against other stationary object, initial encounter: Secondary | ICD-10-CM | POA: Insufficient documentation

## 2011-08-20 DIAGNOSIS — I2510 Atherosclerotic heart disease of native coronary artery without angina pectoris: Secondary | ICD-10-CM | POA: Insufficient documentation

## 2011-08-20 DIAGNOSIS — E119 Type 2 diabetes mellitus without complications: Secondary | ICD-10-CM | POA: Insufficient documentation

## 2011-08-20 LAB — DIFFERENTIAL
Basophils Relative: 0 % (ref 0–1)
Lymphocytes Relative: 22 % (ref 12–46)
Lymphs Abs: 2.2 10*3/uL (ref 0.7–4.0)
Monocytes Relative: 6 % (ref 3–12)
Neutro Abs: 7.2 10*3/uL (ref 1.7–7.7)
Neutrophils Relative %: 71 % (ref 43–77)

## 2011-08-20 LAB — RAPID URINE DRUG SCREEN, HOSP PERFORMED
Amphetamines: NOT DETECTED
Barbiturates: NOT DETECTED
Benzodiazepines: NOT DETECTED
Tetrahydrocannabinol: NOT DETECTED

## 2011-08-20 LAB — CBC
Hemoglobin: 16.1 g/dL (ref 13.0–17.0)
MCHC: 36.1 g/dL — ABNORMAL HIGH (ref 30.0–36.0)
RBC: 5.71 MIL/uL (ref 4.22–5.81)
WBC: 10.2 10*3/uL (ref 4.0–10.5)

## 2011-08-20 LAB — BASIC METABOLIC PANEL
BUN: 11 mg/dL (ref 6–23)
Chloride: 100 mEq/L (ref 96–112)
GFR calc Af Amer: 90 mL/min — ABNORMAL LOW (ref >90)
Potassium: 3.5 mEq/L (ref 3.5–5.1)

## 2011-08-20 NOTE — ED Provider Notes (Signed)
History     CSN: [id number]  Arrival date & time 08/20/11  [name]   First MD Initiated Contact with Patient 08/20/11 1947      Chief Complaint  Patient presents with  . Suicidal    (Consider location/radiation/quality/duration/timing/severity/associated sxs/prior treatment) Patient is a 41 y.o. male presenting with mental health disorder. The history is provided by the patient.  Mental Health Problem The current episode started this week. This is a new problem.  The onset of the illness is precipitated by emotional stress. The degree of incapacity that he is experiencing as a consequence of his illness is moderate. Sequelae of the illness include harmed interpersonal relations. Additional symptoms of the illness include agitation. Associated symptoms comments: Anger issues. He admits to suicidal ideas. He does not have a plan to commit suicide. He contemplates harming himself. He has already injured self (punched a wall). He contemplates injuring another person. He has not already  injured another person.    Past Medical History  Diagnosis Date  . Diabetes mellitus   . Hypertension     Hx of  . Coronary artery disease   . History of nonadherence to medical treatment     Past Surgical History  Procedure Date  . Ptca     Successful percutaneous coronary intervention and stenting of the left anterior descending with placement of a 2.5 x 24 mm Micro-Driver bare-metal stent    Family History  Problem Relation Age of Onset  . Coronary artery disease Neg Hx     History  Substance Use Topics  . Smoking status: Not on file  . Smokeless tobacco: Not on file  . Alcohol Use: Yes      Review of Systems  Psychiatric/Behavioral: Positive for agitation.  All other systems reviewed and are negative.    Allergies  Review of patient's allergies indicates no known allergies.  Home Medications  No current outpatient prescriptions on file.  BP 155/108  Pulse 88  Temp(Src) 98.4  F (36.9 C) (Oral)  Resp 19  SpO2 100%  Physical Exam  Nursing note and vitals reviewed. Constitutional: He is oriented to person, place, and time. He appears well-developed and well-nourished. No distress.  HENT:  Head: Normocephalic and atraumatic.  Neck: Normal range of motion. Neck supple.  Cardiovascular: Normal rate and regular rhythm.   No murmur heard. Pulmonary/Chest: Effort normal and breath sounds normal. No respiratory distress. He has no wheezes.  Abdominal: Soft. Bowel sounds are normal. He exhibits no distension. There is no tenderness.  Musculoskeletal: Normal range of motion. He exhibits no edema.       There are abrasions to the right hand.  Full range of motion.  Neurological: He is alert and oriented to person, place, and time.  Skin: Skin is warm and dry. He is not diaphoretic.    ED Course  Procedures (including critical care time)   Labs Reviewed  URINE RAPID DRUG SCREEN (HOSP PERFORMED)  CBC  DIFFERENTIAL  BASIC METABOLIC PANEL  ETHANOL   No results found.   No diagnosis found.    MDM  The patient will be evaluated by the act team.  Care signed out to Dr. Hyacinth Meeker at shift change.          Geoffery Lyons, MD 08/21/11 [phone]
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
## 2011-08-20 NOTE — ED Notes (Signed)
Patient states that he has an "anger problem" and that he is suicidal; states that if he had a gun, "it would all be over with".  Patient has some small laceration on right hand from punching wall; no active bleeding noted.  Patient states that he does not know why he got mad and punched the wall, states that he just becomes mad with "every little thing".  Denies homocidal ideation.

## 2011-08-20 NOTE — ED Notes (Signed)
Paged security to go to room to wand patient; charge RN notified that patient is suicidal and needs sitter.

## 2011-08-21 ENCOUNTER — Encounter (HOSPITAL_COMMUNITY): Payer: Self-pay

## 2011-08-21 ENCOUNTER — Inpatient Hospital Stay (HOSPITAL_COMMUNITY)
Admission: AD | Admit: 2011-08-21 | Discharge: 2011-08-24 | DRG: 881 | Disposition: A | Discharge status: Home / Self Care | Payer: 59 | Source: Ambulatory Visit | Attending: Psychiatry | Admitting: Psychiatry

## 2011-08-21 DIAGNOSIS — F1021 Alcohol dependence, in remission: Secondary | ICD-10-CM | POA: Diagnosis present

## 2011-08-21 DIAGNOSIS — Z9119 Patient's noncompliance with other medical treatment and regimen: Secondary | ICD-10-CM

## 2011-08-21 DIAGNOSIS — Z7982 Long term (current) use of aspirin: Secondary | ICD-10-CM

## 2011-08-21 DIAGNOSIS — F329 Major depressive disorder, single episode, unspecified: Secondary | ICD-10-CM | POA: Diagnosis present

## 2011-08-21 DIAGNOSIS — R471 Dysarthria and anarthria: Secondary | ICD-10-CM | POA: Diagnosis present

## 2011-08-21 DIAGNOSIS — Z91199 Patient's noncompliance with other medical treatment and regimen due to unspecified reason: Secondary | ICD-10-CM

## 2011-08-21 DIAGNOSIS — Z79899 Other long term (current) drug therapy: Secondary | ICD-10-CM

## 2011-08-21 DIAGNOSIS — I10 Essential (primary) hypertension: Secondary | ICD-10-CM | POA: Diagnosis present

## 2011-08-21 DIAGNOSIS — E119 Type 2 diabetes mellitus without complications: Secondary | ICD-10-CM | POA: Diagnosis present

## 2011-08-21 DIAGNOSIS — I2510 Atherosclerotic heart disease of native coronary artery without angina pectoris: Secondary | ICD-10-CM | POA: Diagnosis present

## 2011-08-21 DIAGNOSIS — F3289 Other specified depressive episodes: Principal | ICD-10-CM | POA: Diagnosis present

## 2011-08-21 DIAGNOSIS — F639 Impulse disorder, unspecified: Secondary | ICD-10-CM | POA: Diagnosis present

## 2011-08-21 LAB — GLUCOSE, CAPILLARY

## 2011-08-21 MED ORDER — TRAZODONE HCL 50 MG PO TABS
150.0000 mg | ORAL_TABLET | Freq: Every evening | ORAL | Status: DC | PRN
  Administered 2011-08-22: 150 mg via ORAL
  Filled 2011-08-21: qty 1

## 2011-08-21 MED ORDER — MAGNESIUM HYDROXIDE 400 MG/5ML PO SUSP: 30.0000 mL | Freq: Every day | ORAL | Status: DC | PRN

## 2011-08-21 MED ORDER — ACETAMINOPHEN 325 MG PO TABS: 650.0000 mg | ORAL_TABLET | Freq: Four times a day (QID) | ORAL | Status: DC | PRN

## 2011-08-21 MED ORDER — ALUM & MAG HYDROXIDE-SIMETH 200-200-20 MG/5ML PO SUSP: 30.0000 mL | ORAL | Status: DC | PRN
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
## 2011-08-21 NOTE — ED Notes (Signed)
Patient transported to behavorial health by security

## 2011-08-21 NOTE — ED Notes (Signed)
Call from family asking if wife [name] can see pt. Pt states it is okay for her to come and see him.

## 2011-08-21 NOTE — ED Notes (Signed)
Security reporting a delay for patient to be transported to behavorial health due to limited staff members.  Security will transport as soon as possible. Patient updated and is aware of delay.

## 2011-08-21 NOTE — ED Notes (Signed)
Marcus from [facility] team here to see pt and awaiting response from [facility]

## 2011-08-21 NOTE — ED Notes (Signed)
Patient to go to behavorial health, security paged and patient signed transfer form.

## 2011-08-21 NOTE — BH Assessment (Signed)
Assessment Note   Update:  Received call stating pt was accepted to [facility] by NP Lynann Bologna to Dr. Catha Brow to bed 307-1.  Completed support paperwork, updated assessment disposition, completed assessment notification and faxed to [facility] to log.  Updated EDP Bednar and ED staff.  ED staff to arrange transport via security, as pt is voluntary.  On Site Evaluation by:   Reviewed with Physician:  Diona Foley 08/21/2011 4:06 PM

## 2011-08-21 NOTE — ED Notes (Signed)
Patient asleep, sitter at bedside, no distress noted. 

## 2011-08-21 NOTE — Tx Team (Signed)
Initial Interdisciplinary Treatment Plan  PATIENT STRENGTHS: (choose at least two) Active sense of humor Capable of independent living Religious Affiliation Supportive family/friends  PATIENT STRESSORS: Health problems Medication change or noncompliance   PROBLEM LIST: Problem List/Patient Goals Date to be addressed Date deferred Reason deferred Estimated date of resolution  anxiety      anger                                                 DISCHARGE CRITERIA:  Ability to meet basic life and health needs Improved stabilization in mood, thinking, and/or behavior Need for constant or close observation no longer present Verbal commitment to aftercare and medication compliance  PRELIMINARY DISCHARGE PLAN: Attend aftercare/continuing care group Outpatient therapy  PATIENT/FAMIILY INVOLVEMENT: This treatment plan has been presented to and reviewed with the patient, [name], and/or family member, .  The patient and family have been given the opportunity to ask questions and make suggestions.  Noah Charon 08/21/2011, 8:27 PM

## 2011-08-21 NOTE — ED Notes (Signed)
Pt assisted by sitter to use phone to make local phone call.

## 2011-08-22 DIAGNOSIS — F1011 Alcohol abuse, in remission: Secondary | ICD-10-CM

## 2011-08-22 DIAGNOSIS — F329 Major depressive disorder, single episode, unspecified: Secondary | ICD-10-CM

## 2011-08-22 LAB — GLUCOSE, CAPILLARY
Glucose-Capillary: 158 mg/dL — ABNORMAL HIGH (ref 70–99)
Glucose-Capillary: 185 mg/dL — ABNORMAL HIGH (ref 70–99)

## 2011-08-22 MED ORDER — LOSARTAN POTASSIUM 50 MG PO TABS
50.0000 mg | ORAL_TABLET | Freq: Every day | ORAL | Status: DC
  Administered 2011-08-22 – 2011-08-24 (×3): 50 mg via ORAL
  Filled 2011-08-22 (×4): qty 1
  Filled 2011-08-22: qty 3
  Filled 2011-08-22: qty 1

## 2011-08-22 MED ORDER — NITROGLYCERIN 0.4 MG SL SUBL: 0.4000 mg | SUBLINGUAL_TABLET | SUBLINGUAL | Status: DC | PRN

## 2011-08-22 MED ORDER — METFORMIN HCL 500 MG PO TABS
500.0000 mg | ORAL_TABLET | Freq: Two times a day (BID) | ORAL | Status: DC
  Administered 2011-08-22 – 2011-08-24 (×4): 500 mg via ORAL
  Filled 2011-08-22 (×5): qty 1
  Filled 2011-08-22: qty 6
  Filled 2011-08-22: qty 1
  Filled 2011-08-22: qty 6
  Filled 2011-08-22: qty 1

## 2011-08-22 MED ORDER — RISPERIDONE 0.25 MG PO TABS
0.2500 mg | ORAL_TABLET | ORAL | Status: DC
  Administered 2011-08-22 – 2011-08-24 (×4): 0.25 mg via ORAL
  Filled 2011-08-22 (×5): qty 1
  Filled 2011-08-22 (×2): qty 28
  Filled 2011-08-22: qty 1

## 2011-08-22 MED ORDER — CITALOPRAM HYDROBROMIDE 20 MG PO TABS
20.0000 mg | ORAL_TABLET | Freq: Every day | ORAL | Status: DC
  Administered 2011-08-22 – 2011-08-24 (×3): 20 mg via ORAL
  Filled 2011-08-22 (×4): qty 1
  Filled 2011-08-22: qty 14
  Filled 2011-08-22 (×2): qty 1

## 2011-08-22 MED ORDER — ASPIRIN 81 MG PO CHEW
81.0000 mg | CHEWABLE_TABLET | Freq: Every day | ORAL | Status: DC
  Administered 2011-08-22 – 2011-08-24 (×3): 81 mg via ORAL
  Filled 2011-08-22 (×2): qty 1
  Filled 2011-08-22: qty 3
  Filled 2011-08-22 (×4): qty 1

## 2011-08-22 MED ORDER — INSULIN ASPART 100 UNIT/ML ~~LOC~~ SOLN
0.0000 [IU] | Freq: Three times a day (TID) | SUBCUTANEOUS | Status: DC
  Administered 2011-08-22: 2 [IU] via SUBCUTANEOUS
  Administered 2011-08-23 (×3): 1 [IU] via SUBCUTANEOUS
  Administered 2011-08-23: 2 [IU] via SUBCUTANEOUS
  Administered 2011-08-24 (×2): 1 [IU] via SUBCUTANEOUS
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
## 2011-08-22 NOTE — Progress Notes (Signed)
Pt laying in bed resting with eyes closed. Respirations even and unlabored. No distress noted.  

## 2011-08-22 NOTE — Progress Notes (Signed)
[facility] Group Notes:  (Counselor/Nursing/MHT/Case Management/Adjunct)  08/22/2011 4:55 PM  Type of Therapy:  Group Therapy at 11  Participation Level:  Did Not Attend   Clide Dales 08/22/2011, 4:55 PM  [facility] Group Notes:  (Counselor/Nursing/MHT/Case Management/Adjunct)  08/22/2011 4:55 PM  Type of Therapy:  Group Therapy at 1:15  Participation Level:  Did Not Attend   Clide Dales 08/22/2011, 4:55 PM

## 2011-08-22 NOTE — Progress Notes (Signed)
Writer spent time with patient today in effort to complete PSA; due to slow speech process another segment of time will be needed to complete. Clide Dales 08/22/2011 4:57 PM

## 2011-08-22 NOTE — Progress Notes (Signed)
Pt attended discharge planning group and attempted to participate.  Pt has a speech impediment and had a difficult time getting his thoughts out.  SW met with pt individually.  Pt states that he doesn't do well in groups talking.  Pt presents with anxious mood and affect.  Pt states that he came to the hospital due to anger issues.  Pt states that he broke a flower pot due to his anger and got into it with his son.  Pt states that he lives in [place] with his wife and 3 sons, ages 59, 83 and 19.  Pt states that his oldest son sent him here.  Pt denied substance use.  Pt states that he doesn't have a psychiatrist or therapist outpatient.  SW will assess for appropriate referrals.  No further needs voiced by pt at this time.  Safety planning and suicide prevention discussed.     Reyes Ivan, LCSWA 08/22/2011  10:32 AM

## 2011-08-22 NOTE — Progress Notes (Signed)
Patient ID: [name], male   DOB: 1970-07-29, 41 y.o.   MRN: [id number] He has been up for meal and went to part of the 1st group. He denies having and problems. Says that he is fine.

## 2011-08-22 NOTE — Progress Notes (Signed)
Pt. Pleasant and cooperative.  Reports  "feeling better".  Encouraged pt. To attend groups.  Pt. Reports that he feels  "unconfortable in groups" because of his speech but he would try to attend.  Denies pain.  Encouragement and support given.  Pt. Receptive.
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
## 2011-08-23 DIAGNOSIS — F1020 Alcohol dependence, uncomplicated: Secondary | ICD-10-CM

## 2011-08-23 LAB — GLUCOSE, CAPILLARY: Glucose-Capillary: 138 mg/dL — ABNORMAL HIGH (ref 70–99)
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
## 2011-08-23 NOTE — H&P (Signed)
Signed for Dr. Ferol Luz.

## 2011-08-23 NOTE — Progress Notes (Addendum)
Pt denies SI/HI/AVH. Pt limited, but cooperative. Pt states that he slept well and his appetite is good. Pt states that his energy level is low and his ability to pay attention is good. Pt rates his hopelessness and depression as 0. Support and encouragement offered. Pt receptive.

## 2011-08-23 NOTE — Tx Team (Signed)
Interdisciplinary Treatment Plan Update (Adult)  Date:  08/23/2011  Time Reviewed:  9:56 AM   Progress in Treatment: Attending groups: Yes Participating in groups:  Yes Taking medication as prescribed: Yes Tolerating medication:  Yes Family/Significant othe contact made:  Counselor assessing for appropriate contact Patient understands diagnosis:  Yes Discussing patient identified problems/goals with staff:  Yes Medical problems stabilized or resolved:  Yes Denies suicidal/homicidal ideation: Yes Issues/concerns per patient self-inventory:  None identified Other: N/A  New problem(s) identified: None Identified  Reason for Continuation of Hospitalization: Anxiety Depression Suicidal Ideation Medication stabilization  Interventions implemented related to continuation of hospitalization: mood stabilization, medication monitoring and adjustment, group therapy and psycho education, safety checks q 15 mins  Additional comments: N/A  Estimated length of stay: 3-5 days  Discharge Plan: Pt will follow up with Arna Medici for medication management and therapy.    New goal(s): N/A  Review of initial/current patient goals per problem list:    1.  Goal(s): Address substance use  Met:  No  Target date: by discharge  As evidenced by: completing detox protocol and refer to appropriate treatment  2.  Goal (s): Reduce depressive and anxiety symptoms  Met:  No  Target date: by discharge  As evidenced by: Reducing depression from a 10 to a 3 as reported by pt.    3.  Goal(s): Eliminate suicidal ideation  Met:  No  Target date: by discharge  As evidenced by: pt denying SI   Attendees: Patient:  [name] 08/23/2011 9:58 AM   Family:     Physician:  Lupe Carney, DO 08/23/2011 9:56 AM   Nursing: Elizabeth Palau, RN 08/23/2011 9:56 AM   Case Manager:  Reyes Ivan, LCSWA 08/23/2011  9:56 AM   Counselor:  Ronda Fairly, LCSWA 08/23/2011  9:56 AM   Other:   Richelle Ito, LCSW 08/23/2011 9:56 AM   Other:  Izola Price, RN 08/23/2011 9:58 AM   Other:     Other:      Scribe for Treatment Team:   Reyes Ivan 08/23/2011 9:56 AM
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
## 2011-08-23 NOTE — Progress Notes (Signed)
Pt attended discharge planning group and actively participated.  Pt presents with calm mood and affect.  Pt denies having depression, anxiety and SI.  Pt is requesting to d/c home.  Pt is open to referrals - SW will refer pt to [facility] for medication management and therapy.  No further needs voiced by pt at this time.    Reyes Ivan, LCSWA 08/23/2011  9:08 AM

## 2011-08-23 NOTE — Progress Notes (Signed)
[facility] Group Notes:  (Counselor/Nursing/MHT/Case Management/Adjunct)  08/23/2011 1:09 PM  Type of Therapy:  Group Therapy at 11AM  Participation Level:  Did Not Attend   [name] 08/23/2011, 1:09 PM  [facility] Group Notes:  (Counselor/Nursing/MHT/Case Management/Adjunct)  08/23/2011 1:10 PM  Type of Therapy:  Group Therapy  Participation Level:  Minimal  Participation Quality:  Attentive  Affect:  Flat  Cognitive:  Oriented  Insight:  None shared  Engagement in Group:  Limited  Engagement in Therapy:  None  Modes of Intervention:  Orientation and Support  Summary of Progress/Problems: [name] attended portion of group therapy session, called out to meet with Dr. and did not return    [name] 08/23/2011, 1:10 PM

## 2011-08-24 MED ORDER — RISPERIDONE 0.25 MG PO TABS: ORAL_TABLET | ORAL | Status: DC

## 2011-08-24 MED ORDER — ASPIRIN 81 MG PO TABS
81.0000 mg | ORAL_TABLET | Freq: Every day | ORAL | Status: DC
Start: 2011-08-24 — End: 2016-07-04

## 2011-08-24 MED ORDER — METFORMIN HCL 500 MG PO TABS: 500.0000 mg | ORAL_TABLET | Freq: Two times a day (BID) | ORAL | Status: DC

## 2011-08-24 MED ORDER — NITROGLYCERIN 0.4 MG SL SUBL: 0.4000 mg | SUBLINGUAL_TABLET | SUBLINGUAL | Status: DC | PRN

## 2011-08-24 MED ORDER — LOSARTAN POTASSIUM 50 MG PO TABS: 50.0000 mg | ORAL_TABLET | Freq: Every day | ORAL | Status: DC

## 2011-08-24 MED ORDER — CITALOPRAM HYDROBROMIDE 20 MG PO TABS: 20.0000 mg | ORAL_TABLET | Freq: Every day | ORAL | Status: DC

## 2011-08-24 NOTE — Progress Notes (Signed)
Pt. States he slept well last night and has no thoughts of SI or HI and contracts for safety. Pt. Does have good eue contact.Apeears to have limited interaction wit the other pts. But is presently in group. States he is ready to go home.

## 2011-08-24 NOTE — Tx Team (Signed)
Interdisciplinary Treatment Plan Update (Adult)  Date:  08/24/2011  Time Reviewed:  10:14 AM   Progress in Treatment: Attending groups: Yes Participating in groups:  Yes Taking medication as prescribed: Yes Tolerating medication:  Yes Family/Significant othe contact made:  Counselor attempting to contact wife today Patient understands diagnosis:  Yes Discussing patient identified problems/goals with staff:  Yes Medical problems stabilized or resolved:  Yes Denies suicidal/homicidal ideation: Yes Issues/concerns per patient self-inventory:  None identified Other: N/A  New problem(s) identified: None Identified  Reason for Continuation of Hospitalization: Stable to d/c  Interventions implemented related to continuation of hospitalization: Stable to d/c  Additional comments: N/A  Estimated length of stay: D/C today  Discharge Plan: Pt will follow up at [facility] for medication management and therapy  New goal(s): N/A  Review of initial/current patient goals per problem list:    1.  Goal(s): Address substance use  Met:  Yes  Target date: by discharge  As evidenced by: completed detox protocol and referred to appropriate treatment  2.  Goal (s): Reduce depressive and anxiety symptoms  Met:  Yes  Target date: by discharge  As evidenced by: Reducing depression from a 10 to a 3 as reported by pt.  Pt ranks depression and anxiety at a 1 today.   3.  Goal(s): Eliminate SI  Met:  Yes  Target date: by discharge  As evidenced by: Pt denies SI.    Attendees: Patient:  [name] 08/24/2011 10:18 AM   Family:     Physician:   08/24/2011 10:14 AM   Nursing: Waynetta Sandy, RN 08/24/2011 10:14 AM   Case Manager:  Reyes Ivan, LCSWA 08/24/2011  10:14 AM   Counselor:  Ronda Fairly, LCSWA 08/24/2011  10:14 AM   Other:  Richelle Ito, LCSW 08/24/2011 10:14 AM   Other:  Lynann Bologna, NP 08/24/2011 10:17 AM   Other:     Other:      Scribe for Treatment Team:     Reyes Ivan 08/24/2011 10:14 AM

## 2011-08-24 NOTE — Progress Notes (Signed)
[facility] Adult Inpatient Family/Significant Other Suicide Prevention Education  Suicide Prevention Education:  Education Completed; Wife Hilliard Borges at updated phone number [phone] has been identified by the patient as the family member/significant other with whom the patient will be residing, and identified as the person(s) who will aid the patient in the event of a mental health crisis (suicidal ideations/suicide attempt).  With written consent from the patient, the family member/significant other has been provided the following suicide prevention education, prior to the and/or following the discharge of the patient.  The suicide prevention education provided includes the following:  Suicide risk factors  Suicide prevention and interventions  National Suicide Hotline telephone number  [facility] assessment telephone number  [facility] Emergency Assistance 911  [facility] and/or Residential Mobile Crisis Unit telephone number  Request made of family/significant other to:  Remove weapons (e.g., guns, rifles, knives), all items previously/currently identified as safety concern.    Remove drugs/medications (over-the-counter, prescriptions, illicit drugs), all items previously/currently identified as a safety concern.  Ms Dunn reports there are no firearms nor narcotics in the home; expressed her belief patient is ready to discharge and expressed gratitude for the help and infiormation.   The family member/significant other verbalizes understanding of the suicide prevention education information provided.  The family member/significant other agrees to remove the items of safety concern listed above.  Clide Dales 08/24/2011, 10:55 AM

## 2011-08-24 NOTE — Progress Notes (Signed)
Patient ID: [name], male   DOB: 08-Nov-1970, 41 y.o.   MRN: [id number] Pt withdrawn during shift with little interaction with staff/peers. Pt endorses depression but denies SI/HI at this time. Pt compliant with medications; no s/s of distress noted.

## 2011-08-24 NOTE — Progress Notes (Signed)
[facility] Group Notes:  (Counselor/Nursing/MHT/Case Management/Adjunct)  08/24/2011 12:04 PM  Type of Therapy:  Group Therapy at 11  Participation Level:  Did Not Attend  Clide Dales 08/24/2011, 12:04 PM  [facility] Group Notes:  (Counselor/Nursing/MHT/Case Management/Adjunct)  08/24/2011 3:36 PM  Type of Therapy:  Group Therapy at 1:15  Participation Level:  Did Not Attend    Clide Dales 08/24/2011, 3:36 PM

## 2011-08-24 NOTE — Progress Notes (Signed)
Pt. For discharge today.

## 2011-08-24 NOTE — Progress Notes (Signed)
Pt. For discharge and will go home to his wife and kids,. Denies any SI or HI and contracts for safety, goood eye contact
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
## 2011-08-24 NOTE — Progress Notes (Signed)
[facility] Case Management Discharge Plan:  Will you be returning to the same living situation after discharge: Yes,  return to own home At discharge, do you have transportation home?:Yes,  wife to transport home Do you have the ability to pay for your medications:Yes,  provided samples and access to meds   Release of information consent forms completed and in the chart;  Patient's signature needed at discharge.  Patient to Follow up at:  Follow-up Information    Follow up with Arna Medici on 08/27/2011. (Appointment scheduled at 7:45 am, referral # [id number])    Contact information:   [address] [place], [place] [address] [phone]         Patient denies SI/HI:   Yes,  pt denies SI/HI today    Safety Planning and Suicide Prevention discussed:  Yes,  discussed with pt  Barrier to discharge identified:No.  Summary and Recommendations: Pt did not attend d/c planning group on this date.  SW met with pt individually at this time.  Pt presents with calm mood and affect.  Pt reports feeling stable to d/c today.  Pt denies depression, anxiety and SI.   No recommendations from SW.  No further needs voiced by pt.  Pt stable to discharge.     Carmina Miller 08/24/2011, 10:19 AM

## 2011-08-24 NOTE — Progress Notes (Addendum)
[facility] Adult Inpatient Family/Significant Other Suicide Prevention Education  Suicide Prevention Education:  Contact Attempts: Wife, Carrson Lightcap at [phone] has been identified by the patient as the family member/significant other with whom the patient will be residing, and identified as the person(s) who will aid the patient in the event of a mental health crisis.  With written consent from the patient, two attempts were made to provide suicide prevention education, prior to and/or following the patient's discharge.  We were unsuccessful in providing suicide prevention education.  A suicide education pamphlet was given to the patient to share with family/significant other.  Writer provided suicide prevention education directly to patient; conversation included risk factors, warning signs and resources to contact for help. Mobile crisis services explained and contact card placed in chart for pt to receive at discharge.   Date and time of first attempt: 08/23/2011  At 1:00PM and 3:15 PM Date and time of second attempt: 08/24/2011 9:30 AM   [name] 08/24/2011, 9:35 AM

## 2011-08-27 NOTE — Progress Notes (Signed)
Patient Discharge Instructions:   No consent for [facility].  [name], 08/27/2011, 4:57 PM

## 2012-01-02 ENCOUNTER — Ambulatory Visit (INDEPENDENT_AMBULATORY_CARE_PROVIDER_SITE_OTHER): Payer: Medicaid Other | Admitting: Cardiology

## 2012-01-02 DIAGNOSIS — R0989 Other specified symptoms and signs involving the circulatory and respiratory systems: Secondary | ICD-10-CM

## 2012-10-23 ENCOUNTER — Inpatient Hospital Stay (HOSPITAL_COMMUNITY): Admission: RE | Admit: 2012-10-23 | Payer: Self-pay | Source: Ambulatory Visit

## 2014-01-08 ENCOUNTER — Encounter: Payer: Self-pay | Admitting: Cardiology

## 2014-01-08 NOTE — Progress Notes (Signed)
No show  This encounter was created in error - please disregard.

## 2014-05-19 ENCOUNTER — Encounter: Payer: Self-pay | Admitting: Cardiology

## 2014-05-19 NOTE — Progress Notes (Signed)
No show  This encounter was created in error - please disregard.

## 2016-07-04 ENCOUNTER — Encounter (HOSPITAL_COMMUNITY): Payer: Self-pay | Admitting: Emergency Medicine

## 2016-07-04 ENCOUNTER — Emergency Department (HOSPITAL_COMMUNITY): Payer: Medicaid Other

## 2016-07-04 ENCOUNTER — Inpatient Hospital Stay (HOSPITAL_COMMUNITY)
Admission: EM | Admit: 2016-07-04 | Discharge: 2016-07-12 | DRG: 246 | Disposition: A | Discharge status: Home / Self Care | Payer: Medicaid Other | Attending: Internal Medicine | Admitting: Internal Medicine

## 2016-07-04 DIAGNOSIS — T82855A Stenosis of coronary artery stent, initial encounter: Principal | ICD-10-CM | POA: Diagnosis present

## 2016-07-04 DIAGNOSIS — Z955 Presence of coronary angioplasty implant and graft: Secondary | ICD-10-CM

## 2016-07-04 DIAGNOSIS — Z7984 Long term (current) use of oral hypoglycemic drugs: Secondary | ICD-10-CM

## 2016-07-04 DIAGNOSIS — Z7982 Long term (current) use of aspirin: Secondary | ICD-10-CM

## 2016-07-04 DIAGNOSIS — I200 Unstable angina: Secondary | ICD-10-CM

## 2016-07-04 DIAGNOSIS — I25110 Atherosclerotic heart disease of native coronary artery with unstable angina pectoris: Secondary | ICD-10-CM | POA: Diagnosis present

## 2016-07-04 DIAGNOSIS — I252 Old myocardial infarction: Secondary | ICD-10-CM

## 2016-07-04 DIAGNOSIS — E1159 Type 2 diabetes mellitus with other circulatory complications: Secondary | ICD-10-CM | POA: Diagnosis present

## 2016-07-04 DIAGNOSIS — E785 Hyperlipidemia, unspecified: Secondary | ICD-10-CM | POA: Diagnosis present

## 2016-07-04 DIAGNOSIS — Y831 Surgical operation with implant of artificial internal device as the cause of abnormal reaction of the patient, or of later complication, without mention of misadventure at the time of the procedure: Secondary | ICD-10-CM | POA: Diagnosis present

## 2016-07-04 DIAGNOSIS — N183 Chronic kidney disease, stage 3 unspecified: Secondary | ICD-10-CM

## 2016-07-04 DIAGNOSIS — J690 Pneumonitis due to inhalation of food and vomit: Secondary | ICD-10-CM | POA: Diagnosis not present

## 2016-07-04 DIAGNOSIS — R509 Fever, unspecified: Secondary | ICD-10-CM

## 2016-07-04 DIAGNOSIS — IMO0002 Reserved for concepts with insufficient information to code with codable children: Secondary | ICD-10-CM | POA: Diagnosis present

## 2016-07-04 DIAGNOSIS — E1165 Type 2 diabetes mellitus with hyperglycemia: Secondary | ICD-10-CM | POA: Diagnosis present

## 2016-07-04 DIAGNOSIS — R079 Chest pain, unspecified: Secondary | ICD-10-CM | POA: Diagnosis present

## 2016-07-04 DIAGNOSIS — E1122 Type 2 diabetes mellitus with diabetic chronic kidney disease: Secondary | ICD-10-CM | POA: Diagnosis present

## 2016-07-04 DIAGNOSIS — R4781 Slurred speech: Secondary | ICD-10-CM | POA: Diagnosis present

## 2016-07-04 DIAGNOSIS — N179 Acute kidney failure, unspecified: Secondary | ICD-10-CM

## 2016-07-04 DIAGNOSIS — I129 Hypertensive chronic kidney disease with stage 1 through stage 4 chronic kidney disease, or unspecified chronic kidney disease: Secondary | ICD-10-CM | POA: Diagnosis present

## 2016-07-04 DIAGNOSIS — I25119 Atherosclerotic heart disease of native coronary artery with unspecified angina pectoris: Secondary | ICD-10-CM | POA: Diagnosis present

## 2016-07-04 DIAGNOSIS — J189 Pneumonia, unspecified organism: Secondary | ICD-10-CM

## 2016-07-04 DIAGNOSIS — I10 Essential (primary) hypertension: Secondary | ICD-10-CM | POA: Diagnosis present

## 2016-07-04 DIAGNOSIS — I255 Ischemic cardiomyopathy: Secondary | ICD-10-CM | POA: Diagnosis present

## 2016-07-04 DIAGNOSIS — I2510 Atherosclerotic heart disease of native coronary artery without angina pectoris: Secondary | ICD-10-CM | POA: Diagnosis present

## 2016-07-04 DIAGNOSIS — Z9114 Patient's other noncompliance with medication regimen: Secondary | ICD-10-CM

## 2016-07-04 HISTORY — DX: Ischemic cardiomyopathy: I25.5

## 2016-07-04 HISTORY — DX: Alcohol abuse, uncomplicated: F10.10

## 2016-07-04 LAB — I-STAT TROPONIN, ED: TROPONIN I, POC: 0 ng/mL (ref 0.00–0.08)

## 2016-07-04 LAB — BASIC METABOLIC PANEL
ANION GAP: 7 (ref 5–15)
BUN: 8 mg/dL (ref 6–20)
CO2: 25 mmol/L (ref 22–32)
Calcium: 8.7 mg/dL — ABNORMAL LOW (ref 8.9–10.3)
Chloride: 104 mmol/L (ref 101–111)
Creatinine, Ser: 1.32 mg/dL — ABNORMAL HIGH (ref 0.61–1.24)
GFR calc Af Amer: >60 mL/min (ref >60)
Glucose, Bld: 174 mg/dL — ABNORMAL HIGH (ref 65–99)
POTASSIUM: 4.1 mmol/L (ref 3.5–5.1)
SODIUM: 136 mmol/L (ref 135–145)

## 2016-07-04 LAB — CBC
HCT: 42.3 % (ref 39.0–52.0)
HEMOGLOBIN: 14.3 g/dL (ref 13.0–17.0)
MCH: 27.1 pg (ref 26.0–34.0)
MCHC: 33.8 g/dL (ref 30.0–36.0)
MCV: 80.1 fL (ref 78.0–100.0)
PLATELETS: 242 10*3/uL (ref 150–400)
RBC: 5.28 MIL/uL (ref 4.22–5.81)
RDW: 13.1 % (ref 11.5–15.5)
WBC: 8.5 10*3/uL (ref 4.0–10.5)

## 2016-07-04 MED ORDER — SODIUM CHLORIDE 0.9 % IV BOLUS (SEPSIS)
1000.0000 mL | Freq: Once | INTRAVENOUS | Status: AC
  Administered 2016-07-04: 1000 mL via INTRAVENOUS

## 2016-07-04 NOTE — ED Triage Notes (Signed)
Per [facility]. Pt coming from home. Pt c/o CP. Radiates across chest bilaterally. Pt has has a previous hx of MI with stent placement around 2010. Pt took 2 NTG and ASA at home with no pain relief. Pt was painless when EMS arrived.  [id number]

## 2016-07-04 NOTE — ED Provider Notes (Signed)
[facility]-EMERGENCY DEPT Provider Note   CSN: [id number] Arrival date & time: 07/04/16  2211    History   Chief Complaint Chief Complaint  Patient presents with  . Chest Pain    HPI [name] is a 46 y.o. male.  [name] is a 46 year old male with a history of coronary artery disease s/p anterior wall myocardial infarction back in November 2010 resulting in bare-metal stent placement to the mid left anterior descending. PMH also significant for diabetes mellitus, hypertension, and hyperlipidemia. He presents to the emergency department today for complaints of chest pain. He cannot indicate when his chest pain began, but states that it was constant after onset. He states that his pain was a pressure-like sensation radiating across his chest bilaterally. He took SL NTG x 2 and ASA at home without relief, though he is now chest pain free. No associated SOB, diaphoresis, nausea, vomiting, abdominal pain. He reports compliance with his daily medications. Patient states that he has not seen a cardiologist due to inability to "find time to schedule an appointment".  LVEF 45-50% in February 2012. Patient previously followed by Dr. Antoine Washington.   The history is provided by the patient. No language interpreter was used.  Chest Pain      Past Medical History:  Diagnosis Date  . Anterior myocardial infarction [facility])    November 2010  . CAD (coronary artery disease), native coronary artery    Thrombectomy with BMS to mid LAD November 2010  . Essential hypertension   . History of nonadherence to medical treatment   . Type 2 diabetes mellitus [facility])     Patient Active Problem List   Diagnosis Date Noted  . Major depression 08/22/2011  . DYSLIPIDEMIA 02/22/2009  . CORONARY ATHEROSCLEROSIS NATIVE CORONARY ARTERY 02/22/2009  . Type 2 diabetes mellitus, uncontrolled (HCC) 02/21/2009  . Essential hypertension 02/21/2009    Past Surgical History:  Procedure Laterality Date  . Knee  arthroscopic surgery Right        Home Medications    Prior to Admission medications   Medication Sig Start Date End Date Taking? Authorizing Provider  atorvastatin (LIPITOR) 40 MG tablet Take 40 mg by mouth daily.   Yes Historical Provider, MD  citalopram (CELEXA) 20 MG tablet Take 1 tablet (20 mg total) by mouth daily. For depression and irritability. 08/24/11  Yes Kevin Spare, FNP  glipiZIDE (GLUCOTROL) 10 MG tablet Take 10 mg by mouth daily before breakfast.   Yes Historical Provider, MD  hydrOXYzine (ATARAX/VISTARIL) 25 MG tablet Take 25 mg by mouth 3 (three) times daily.   Yes Historical Provider, MD  metFORMIN (GLUCOPHAGE) 500 MG tablet Take 1 tablet (500 mg total) by mouth 2 (two) times daily with a meal. For diabetes 08/24/11  Yes Kevin Spare, FNP  nitroGLYCERIN (NITROSTAT) 0.4 MG SL tablet Place 1 tablet (0.4 mg total) under the tongue every 5 (five) minutes as needed for chest pain. For chest pain. History of MI. 08/24/11  Yes Kevin Spare, FNP  pioglitazone (ACTOS) 30 MG tablet Take 30 mg by mouth daily.   Yes Historical Provider, MD  sitaGLIPtin (JANUVIA) 50 MG tablet Take 50 mg by mouth daily.   Yes Historical Provider, MD    Family History Family History  Problem Relation Age of Onset  . Diabetes Mellitus II      Social History Social History  Substance Use Topics  . Smoking status: Never Smoker  . Smokeless tobacco: Not on file  . Alcohol use 0.6  oz/week    1 Cans of beer per week     Allergies   Patient has no known allergies.   Review of Systems Review of Systems  Cardiovascular: Positive for chest pain.  Ten systems reviewed and are negative for acute change, except as noted in the HPI.    Physical Exam Updated Vital Signs BP 140/79 (BP Location: Right Arm)   Pulse 81   Resp (!) 22   SpO2 99%   Physical Exam  Constitutional: He is oriented to person, place, and time. He appears well-developed and well-nourished. No distress.    Nontoxic and in NAD  HENT:  Head: Normocephalic and atraumatic.  Eyes: Conjunctivae and EOM are normal. No scleral icterus.  Neck: Normal range of motion.  Cardiovascular: Normal rate, regular rhythm and intact distal pulses.   Pulmonary/Chest: Effort normal. No respiratory distress. He has no wheezes. He has no rales.  Lungs CTAB  Abdominal: Soft. He exhibits no distension.  Musculoskeletal: Normal range of motion.  Neurological: He is alert and oriented to person, place, and time. He exhibits normal muscle tone. Coordination normal.  GCS 15. Patient moving all extremities.  Skin: Skin is warm and dry. No rash noted. He is not diaphoretic. No erythema. No pallor.  Psychiatric: He has a normal mood and affect. His behavior is normal.  Stuttered speech  Nursing note and vitals reviewed.    ED Treatments / Results  Labs (all labs ordered are listed, but only abnormal results are displayed) Labs Reviewed  BASIC METABOLIC PANEL - Abnormal; Notable for the following:       Result Value   Glucose, Bld 174 (*)    Creatinine, Ser 1.32 (*)    Calcium 8.7 (*)    All other components within normal limits  CBC  RAPID URINE DRUG SCREEN, HOSP PERFORMED  I-STAT TROPOININ, ED    EKG  EKG Interpretation  Date/Time:  Wednesday July 04 2016 22:33:42 EDT Ventricular Rate:  82 PR Interval:    QRS Duration: 87 QT Interval:  400 QTC Calculation: 468 R Axis:   -35 Text Interpretation:  Sinus rhythm Inferior infarct, old Anterolateral infarct, age indeterminate Normal sinus rhythm Confirmed by Kandis Mannan ([id number]) on 07/04/2016 10:36:53 PM       Radiology Dg Chest 2 View  Result Date: 07/04/2016 CLINICAL DATA:  Chest pain with dyspnea radiating across a chest EXAM: CHEST  2 VIEW COMPARISON:  11/07/2014 FINDINGS: The heart size and mediastinal contours are within normal limits. Both lungs are clear. The visualized skeletal structures are unremarkable. IMPRESSION: No active  cardiopulmonary disease. Electronically Signed   By: Tollie Eth M.D.   On: 07/04/2016 23:05    Procedures Procedures (including critical care time)  Medications Ordered in ED Medications  sodium chloride 0.9 % bolus 1,000 mL (1,000 mLs Intravenous New Bag/Given 07/04/16 2355)     Initial Impression / Assessment and Plan / ED Course  I have reviewed the triage vital signs and the nursing notes.  Pertinent labs & imaging results that were available during my care of the patient were reviewed by me and considered in my medical decision making (see chart for details).     46 year old male presents to the emergency department for chest pain. He is chest pain-free currently. He has history of coronary artery disease, hypertension, dyslipidemia, diabetes. History significant for prior MI requiring stenting. Patient is poorly compliant with outpatient cardiology follow-up. He states that he last saw a cardiologist 3-4 years ago. He does  report compliance with his daily medications.  Initial workup is reassuring today; however, given history and risk factors with patient being unreliable for follow-up, will admit for chest pain rule out. Case discussed with Dr. Adela Glimpse who will admit.   Final Clinical Impressions(s) / ED Diagnoses   Final diagnoses:  Chest pain, unspecified type    New Prescriptions New Prescriptions   No medications on file     Antony Madura, PA-C 07/05/16 0013    Courteney Lyn Mackuen, MD 07/05/16 1651

## 2016-07-05 ENCOUNTER — Encounter (HOSPITAL_COMMUNITY): Payer: Self-pay | Admitting: Internal Medicine

## 2016-07-05 ENCOUNTER — Encounter (HOSPITAL_COMMUNITY)
Admission: EM | Disposition: A | Discharge status: Home / Self Care | Payer: Self-pay | Source: Home / Self Care | Attending: Internal Medicine

## 2016-07-05 ENCOUNTER — Other Ambulatory Visit: Payer: Self-pay | Admitting: Physician Assistant

## 2016-07-05 DIAGNOSIS — J690 Pneumonitis due to inhalation of food and vomit: Secondary | ICD-10-CM | POA: Diagnosis not present

## 2016-07-05 DIAGNOSIS — I10 Essential (primary) hypertension: Secondary | ICD-10-CM

## 2016-07-05 DIAGNOSIS — I2510 Atherosclerotic heart disease of native coronary artery without angina pectoris: Secondary | ICD-10-CM

## 2016-07-05 DIAGNOSIS — E1122 Type 2 diabetes mellitus with diabetic chronic kidney disease: Secondary | ICD-10-CM | POA: Diagnosis present

## 2016-07-05 DIAGNOSIS — I252 Old myocardial infarction: Secondary | ICD-10-CM | POA: Diagnosis not present

## 2016-07-05 DIAGNOSIS — Z9114 Patient's other noncompliance with medication regimen: Secondary | ICD-10-CM | POA: Diagnosis not present

## 2016-07-05 DIAGNOSIS — I129 Hypertensive chronic kidney disease with stage 1 through stage 4 chronic kidney disease, or unspecified chronic kidney disease: Secondary | ICD-10-CM | POA: Diagnosis present

## 2016-07-05 DIAGNOSIS — E1165 Type 2 diabetes mellitus with hyperglycemia: Secondary | ICD-10-CM | POA: Diagnosis present

## 2016-07-05 DIAGNOSIS — I25110 Atherosclerotic heart disease of native coronary artery with unstable angina pectoris: Secondary | ICD-10-CM

## 2016-07-05 DIAGNOSIS — E1100 Type 2 diabetes mellitus with hyperosmolarity without nonketotic hyperglycemic-hyperosmolar coma (NKHHC): Secondary | ICD-10-CM

## 2016-07-05 DIAGNOSIS — Z7984 Long term (current) use of oral hypoglycemic drugs: Secondary | ICD-10-CM | POA: Diagnosis not present

## 2016-07-05 DIAGNOSIS — I255 Ischemic cardiomyopathy: Secondary | ICD-10-CM | POA: Diagnosis present

## 2016-07-05 DIAGNOSIS — Z955 Presence of coronary angioplasty implant and graft: Secondary | ICD-10-CM | POA: Diagnosis not present

## 2016-07-05 DIAGNOSIS — N170 Acute kidney failure with tubular necrosis: Secondary | ICD-10-CM | POA: Diagnosis not present

## 2016-07-05 DIAGNOSIS — N183 Chronic kidney disease, stage 3 (moderate): Secondary | ICD-10-CM | POA: Diagnosis present

## 2016-07-05 DIAGNOSIS — R509 Fever, unspecified: Secondary | ICD-10-CM | POA: Diagnosis not present

## 2016-07-05 DIAGNOSIS — Y831 Surgical operation with implant of artificial internal device as the cause of abnormal reaction of the patient, or of later complication, without mention of misadventure at the time of the procedure: Secondary | ICD-10-CM | POA: Diagnosis present

## 2016-07-05 DIAGNOSIS — R079 Chest pain, unspecified: Secondary | ICD-10-CM | POA: Diagnosis present

## 2016-07-05 DIAGNOSIS — Z7982 Long term (current) use of aspirin: Secondary | ICD-10-CM | POA: Diagnosis not present

## 2016-07-05 DIAGNOSIS — R4781 Slurred speech: Secondary | ICD-10-CM | POA: Diagnosis present

## 2016-07-05 DIAGNOSIS — E1121 Type 2 diabetes mellitus with diabetic nephropathy: Secondary | ICD-10-CM | POA: Diagnosis not present

## 2016-07-05 DIAGNOSIS — I200 Unstable angina: Secondary | ICD-10-CM | POA: Diagnosis not present

## 2016-07-05 DIAGNOSIS — I2582 Chronic total occlusion of coronary artery: Secondary | ICD-10-CM | POA: Diagnosis not present

## 2016-07-05 DIAGNOSIS — E785 Hyperlipidemia, unspecified: Secondary | ICD-10-CM | POA: Diagnosis present

## 2016-07-05 DIAGNOSIS — T82855A Stenosis of coronary artery stent, initial encounter: Secondary | ICD-10-CM | POA: Diagnosis present

## 2016-07-05 DIAGNOSIS — N178 Other acute kidney failure: Secondary | ICD-10-CM | POA: Diagnosis not present

## 2016-07-05 DIAGNOSIS — N179 Acute kidney failure, unspecified: Secondary | ICD-10-CM | POA: Diagnosis present

## 2016-07-05 HISTORY — PX: LEFT HEART CATH AND CORONARY ANGIOGRAPHY: CATH118249

## 2016-07-05 LAB — HEMOGLOBIN A1C
Hgb A1c MFr Bld: 8.9 % — ABNORMAL HIGH (ref 4.8–5.6)
MEAN PLASMA GLUCOSE: 209 mg/dL

## 2016-07-05 LAB — TROPONIN I
Troponin I: <0.03 ng/mL (ref <0.03)
Troponin I: <0.03 ng/mL (ref <0.03)
Troponin I: <0.03 ng/mL (ref <0.03)

## 2016-07-05 LAB — RAPID URINE DRUG SCREEN, HOSP PERFORMED
Amphetamines: NOT DETECTED
BENZODIAZEPINES: NOT DETECTED
Barbiturates: NOT DETECTED
Cocaine: NOT DETECTED
Opiates: NOT DETECTED
Tetrahydrocannabinol: NOT DETECTED

## 2016-07-05 LAB — GLUCOSE, CAPILLARY
GLUCOSE-CAPILLARY: 132 mg/dL — AB (ref 65–99)
GLUCOSE-CAPILLARY: 268 mg/dL — AB (ref 65–99)
Glucose-Capillary: 111 mg/dL — ABNORMAL HIGH (ref 65–99)
Glucose-Capillary: 252 mg/dL — ABNORMAL HIGH (ref 65–99)

## 2016-07-05 LAB — PROTIME-INR
INR: 1.14
PROTHROMBIN TIME: 14.7 s (ref 11.4–15.2)

## 2016-07-05 LAB — HIV ANTIBODY (ROUTINE TESTING W REFLEX): HIV Screen 4th Generation wRfx: NONREACTIVE

## 2016-07-05 SURGERY — LEFT HEART CATH AND CORONARY ANGIOGRAPHY
Anesthesia: LOCAL

## 2016-07-05 MED ORDER — ATORVASTATIN CALCIUM 80 MG PO TABS
80.0000 mg | ORAL_TABLET | Freq: Every day | ORAL | Status: DC
  Administered 2016-07-05 – 2016-07-12 (×8): 80 mg via ORAL
  Filled 2016-07-05 (×8): qty 1

## 2016-07-05 MED ORDER — IOPAMIDOL (ISOVUE-370) INJECTION 76%
INTRAVENOUS | Status: DC | PRN
  Administered 2016-07-05: 130 mL via INTRA_ARTERIAL

## 2016-07-05 MED ORDER — SODIUM CHLORIDE 0.9% FLUSH: 3.0000 mL | INTRAVENOUS | Status: DC | PRN

## 2016-07-05 MED ORDER — VERAPAMIL HCL 2.5 MG/ML IV SOLN
INTRAVENOUS | Status: DC | PRN
  Administered 2016-07-05: 10 mL via INTRA_ARTERIAL

## 2016-07-05 MED ORDER — SODIUM CHLORIDE 0.9 % WEIGHT BASED INFUSION: 1.0000 mL/kg/h | INTRAVENOUS | Status: DC

## 2016-07-05 MED ORDER — HEPARIN SODIUM (PORCINE) 1000 UNIT/ML IJ SOLN
INTRAMUSCULAR | Status: AC
  Filled 2016-07-05: qty 1

## 2016-07-05 MED ORDER — NITROGLYCERIN 1 MG/10 ML FOR IR/CATH LAB
INTRA_ARTERIAL | Status: DC | PRN
  Administered 2016-07-05: 200 ug via INTRACORONARY

## 2016-07-05 MED ORDER — SODIUM CHLORIDE 0.9 % IV SOLN: 250.0000 mL | INTRAVENOUS | Status: DC | PRN

## 2016-07-05 MED ORDER — HEPARIN (PORCINE) IN NACL 2-0.9 UNIT/ML-% IJ SOLN
INTRAMUSCULAR | Status: DC | PRN
  Administered 2016-07-05: 1000 mL

## 2016-07-05 MED ORDER — SODIUM CHLORIDE 0.9% FLUSH
3.0000 mL | Freq: Two times a day (BID) | INTRAVENOUS | Status: DC
  Administered 2016-07-09 – 2016-07-11 (×3): 3 mL via INTRAVENOUS

## 2016-07-05 MED ORDER — IOPAMIDOL (ISOVUE-370) INJECTION 76%
INTRAVENOUS | Status: AC
  Filled 2016-07-05: qty 100

## 2016-07-05 MED ORDER — ONDANSETRON HCL 4 MG/2ML IJ SOLN
4.0000 mg | Freq: Four times a day (QID) | INTRAMUSCULAR | Status: DC | PRN
  Administered 2016-07-06: 4 mg via INTRAVENOUS

## 2016-07-05 MED ORDER — ASPIRIN EC 81 MG PO TBEC
81.0000 mg | DELAYED_RELEASE_TABLET | Freq: Every day | ORAL | Status: DC
  Administered 2016-07-07 – 2016-07-08 (×2): 81 mg via ORAL
  Filled 2016-07-05 (×3): qty 1

## 2016-07-05 MED ORDER — LIDOCAINE HCL (PF) 1 % IJ SOLN
INTRAMUSCULAR | Status: AC
  Filled 2016-07-05: qty 30

## 2016-07-05 MED ORDER — ASPIRIN 81 MG PO CHEW
81.0000 mg | CHEWABLE_TABLET | ORAL | Status: AC
  Administered 2016-07-06: 81 mg via ORAL
  Filled 2016-07-05: qty 1

## 2016-07-05 MED ORDER — ASPIRIN EC 81 MG PO TBEC: 81.0000 mg | DELAYED_RELEASE_TABLET | Freq: Every day | ORAL | Status: DC

## 2016-07-05 MED ORDER — NITROGLYCERIN IN D5W 200-5 MCG/ML-% IV SOLN
INTRAVENOUS | Status: AC
Start: 2016-07-05 — End: 2016-07-05
  Filled 2016-07-05: qty 250

## 2016-07-05 MED ORDER — ACETAMINOPHEN 325 MG PO TABS: 650.0000 mg | ORAL_TABLET | ORAL | Status: DC | PRN

## 2016-07-05 MED ORDER — ENOXAPARIN SODIUM 40 MG/0.4ML ~~LOC~~ SOLN: 40.0000 mg | SUBCUTANEOUS | Status: DC

## 2016-07-05 MED ORDER — INSULIN ASPART 100 UNIT/ML ~~LOC~~ SOLN
0.0000 [IU] | SUBCUTANEOUS | Status: DC
  Administered 2016-07-05: 5 [IU] via SUBCUTANEOUS
  Administered 2016-07-06 (×2): 3 [IU] via SUBCUTANEOUS
  Administered 2016-07-06: 2 [IU] via SUBCUTANEOUS
  Administered 2016-07-06: 5 [IU] via SUBCUTANEOUS
  Administered 2016-07-06: 2 [IU] via SUBCUTANEOUS
  Administered 2016-07-07: 3 [IU] via SUBCUTANEOUS
  Administered 2016-07-07: 2 [IU] via SUBCUTANEOUS
  Administered 2016-07-07: 1 [IU] via SUBCUTANEOUS
  Administered 2016-07-07: 2 [IU] via SUBCUTANEOUS
  Administered 2016-07-08: 5 [IU] via SUBCUTANEOUS
  Administered 2016-07-08: 1 [IU] via SUBCUTANEOUS
  Administered 2016-07-08: 3 [IU] via SUBCUTANEOUS
  Administered 2016-07-08: 1 [IU] via SUBCUTANEOUS
  Administered 2016-07-08: 2 [IU] via SUBCUTANEOUS
  Administered 2016-07-08: 1 [IU] via SUBCUTANEOUS
  Administered 2016-07-09: 3 [IU] via SUBCUTANEOUS
  Administered 2016-07-09: 1 [IU] via SUBCUTANEOUS

## 2016-07-05 MED ORDER — SODIUM CHLORIDE 0.9 % WEIGHT BASED INFUSION: 3.0000 mL/kg/h | INTRAVENOUS | Status: DC

## 2016-07-05 MED ORDER — ASPIRIN 81 MG PO CHEW: 81.0000 mg | CHEWABLE_TABLET | ORAL | Status: DC

## 2016-07-05 MED ORDER — NITROGLYCERIN IN D5W 200-5 MCG/ML-% IV SOLN
0.0000 ug/min | INTRAVENOUS | Status: DC
  Administered 2016-07-06 – 2016-07-07 (×3): 80 ug/min via INTRAVENOUS
  Administered 2016-07-07: 50 ug/min via INTRAVENOUS
  Administered 2016-07-08: 10 ug/min via INTRAVENOUS
  Administered 2016-07-09: 14:00:00 16.667 ug/min via INTRAVENOUS
  Filled 2016-07-05 (×6): qty 250

## 2016-07-05 MED ORDER — ASPIRIN 81 MG PO CHEW
81.0000 mg | CHEWABLE_TABLET | ORAL | Status: AC
  Administered 2016-07-05: 81 mg via ORAL

## 2016-07-05 MED ORDER — SODIUM CHLORIDE 0.9 % IV SOLN
INTRAVENOUS | Status: DC
  Administered 2016-07-05: 12:00:00 via INTRAVENOUS

## 2016-07-05 MED ORDER — MORPHINE SULFATE (PF) 2 MG/ML IV SOLN
2.0000 mg | Freq: Once | INTRAVENOUS | Status: AC
  Administered 2016-07-05: 2 mg via INTRAVENOUS

## 2016-07-05 MED ORDER — ONDANSETRON HCL 4 MG/2ML IJ SOLN
4.0000 mg | Freq: Four times a day (QID) | INTRAMUSCULAR | Status: DC | PRN
  Filled 2016-07-05: qty 2

## 2016-07-05 MED ORDER — HEPARIN SODIUM (PORCINE) 1000 UNIT/ML IJ SOLN
INTRAMUSCULAR | Status: DC | PRN
  Administered 2016-07-05: 4000 [IU] via INTRAVENOUS
  Administered 2016-07-05: 6000 [IU] via INTRAVENOUS

## 2016-07-05 MED ORDER — NITROGLYCERIN 0.4 MG SL SUBL
0.4000 mg | SUBLINGUAL_TABLET | SUBLINGUAL | Status: DC | PRN
  Administered 2016-07-05 (×2): 0.4 mg via SUBLINGUAL

## 2016-07-05 MED ORDER — ATORVASTATIN CALCIUM 40 MG PO TABS: 40.0000 mg | ORAL_TABLET | Freq: Every day | ORAL | Status: DC

## 2016-07-05 MED ORDER — NITROGLYCERIN 1 MG/10 ML FOR IR/CATH LAB
INTRA_ARTERIAL | Status: AC
  Filled 2016-07-05: qty 10

## 2016-07-05 MED ORDER — HEPARIN (PORCINE) IN NACL 2-0.9 UNIT/ML-% IJ SOLN
INTRAMUSCULAR | Status: AC
  Filled 2016-07-05: qty 1000

## 2016-07-05 MED ORDER — FENTANYL CITRATE (PF) 100 MCG/2ML IJ SOLN
INTRAMUSCULAR | Status: DC | PRN
  Administered 2016-07-05 (×2): 50 ug via INTRAVENOUS

## 2016-07-05 MED ORDER — MORPHINE SULFATE (PF) 2 MG/ML IV SOLN
2.0000 mg | INTRAVENOUS | Status: DC | PRN
  Administered 2016-07-05: 2 mg via INTRAVENOUS
  Filled 2016-07-05 (×2): qty 1

## 2016-07-05 MED ORDER — SODIUM CHLORIDE 0.9 % WEIGHT BASED INFUSION
1.0000 mL/kg/h | INTRAVENOUS | Status: DC
  Administered 2016-07-05: 1 mL/kg/h via INTRAVENOUS

## 2016-07-05 MED ORDER — NITROGLYCERIN 0.4 MG SL SUBL
SUBLINGUAL_TABLET | SUBLINGUAL | Status: AC
  Filled 2016-07-05: qty 1

## 2016-07-05 MED ORDER — VERAPAMIL HCL 2.5 MG/ML IV SOLN
INTRAVENOUS | Status: AC
  Filled 2016-07-05: qty 2

## 2016-07-05 MED ORDER — MIDAZOLAM HCL 2 MG/2ML IJ SOLN
INTRAMUSCULAR | Status: AC
  Filled 2016-07-05: qty 2

## 2016-07-05 MED ORDER — GI COCKTAIL ~~LOC~~
30.0000 mL | Freq: Once | ORAL | Status: AC
  Administered 2016-07-05: 30 mL via ORAL
  Filled 2016-07-05: qty 30

## 2016-07-05 MED ORDER — SODIUM CHLORIDE 0.9% FLUSH: 3.0000 mL | Freq: Two times a day (BID) | INTRAVENOUS | Status: DC

## 2016-07-05 MED ORDER — ASPIRIN 81 MG PO CHEW
81.0000 mg | CHEWABLE_TABLET | Freq: Every day | ORAL | Status: DC
  Filled 2016-07-05: qty 1

## 2016-07-05 MED ORDER — FENTANYL CITRATE (PF) 100 MCG/2ML IJ SOLN
INTRAMUSCULAR | Status: AC
  Filled 2016-07-05: qty 2

## 2016-07-05 MED ORDER — LIDOCAINE HCL (PF) 1 % IJ SOLN
INTRAMUSCULAR | Status: DC | PRN
  Administered 2016-07-05: 2 mL

## 2016-07-05 MED ORDER — CITALOPRAM HYDROBROMIDE 20 MG PO TABS
20.0000 mg | ORAL_TABLET | Freq: Every day | ORAL | Status: DC
  Administered 2016-07-05 – 2016-07-12 (×8): 20 mg via ORAL
  Filled 2016-07-05 (×8): qty 1

## 2016-07-05 MED ORDER — HEPARIN (PORCINE) IN NACL 100-0.45 UNIT/ML-% IJ SOLN
1900.0000 [IU]/h | INTRAMUSCULAR | Status: DC
  Administered 2016-07-05: 1000 [IU]/h via INTRAVENOUS
  Administered 2016-07-06: 1550 [IU]/h via INTRAVENOUS
  Administered 2016-07-08: 1800 [IU]/h via INTRAVENOUS
  Administered 2016-07-08: 1750 [IU]/h via INTRAVENOUS
  Administered 2016-07-08: 1800 [IU]/h via INTRAVENOUS
  Filled 2016-07-05 (×5): qty 250

## 2016-07-05 MED ORDER — MIDAZOLAM HCL 2 MG/2ML IJ SOLN
INTRAMUSCULAR | Status: DC | PRN
  Administered 2016-07-05 (×2): 1 mg via INTRAVENOUS

## 2016-07-05 MED ORDER — NITROGLYCERIN IN D5W 200-5 MCG/ML-% IV SOLN
INTRAVENOUS | Status: DC | PRN
  Administered 2016-07-05: 10 ug/min via INTRAVENOUS

## 2016-07-05 SURGICAL SUPPLY — 16 items
CATH EXPO 5F FL3.5 (CATHETERS) ×1 IMPLANT
CATH INFINITI JR4 5F (CATHETERS) ×1 IMPLANT
CATH LAUNCHER 5F EBU3.5 (CATHETERS) ×1 IMPLANT
CATH VISTA GUIDE 6FR XBLAD3.5 (CATHETERS) ×1 IMPLANT
COVER PRB 48X5XTLSCP FOLD TPE (BAG) IMPLANT
COVER PROBE 5X48 (BAG) ×2
DEVICE RAD COMP TR BAND LRG (VASCULAR PRODUCTS) ×1 IMPLANT
GLIDESHEATH SLEND A-KIT 6F 22G (SHEATH) ×1 IMPLANT
GUIDEWIRE INQWIRE 1.5J.035X260 (WIRE) IMPLANT
INQWIRE 1.5J .035X260CM (WIRE) ×2
KIT ENCORE 26 ADVANTAGE (KITS) ×1 IMPLANT
KIT HEART LEFT (KITS) ×2 IMPLANT
PACK CARDIAC CATHETERIZATION (CUSTOM PROCEDURE TRAY) ×2 IMPLANT
TRANSDUCER W/STOPCOCK (MISCELLANEOUS) ×2 IMPLANT
TUBING CIL FLEX 10 FLL-RA (TUBING) ×2 IMPLANT
WIRE ASAHI PROWATER 180CM (WIRE) ×1 IMPLANT

## 2016-07-05 NOTE — Progress Notes (Signed)
Pt c/o Chest Soreness (5/10).  BP=148/83. Pt was given 2 SL NTG and Chest "Soreness" down to 0/10.  Crystal in Cath lab notified.  Pt for Cath today, no scheduled time. Will continue to monitor.

## 2016-07-05 NOTE — Interval H&P Note (Signed)
Cath Lab Visit (complete for each Cath Lab visit)  Clinical Evaluation Leading to the Procedure:   ACS: Yes.    Non-ACS:    Anginal Classification: CCS III  Anti-ischemic medical therapy: Maximal Therapy (2 or more classes of medications)  Non-Invasive Test Results: No non-invasive testing performed  Prior CABG: No previous CABG      History and Physical Interval Note:  07/05/2016 3:18 PM  [name]  has presented today for surgery, with the diagnosis of chest pain  The various methods of treatment have been discussed with the patient and family. After consideration of risks, benefits and other options for treatment, the patient has consented to  Procedure(s): Left Heart Cath and Coronary Angiography (N/A) as a surgical intervention .  The patient's history has been reviewed, patient examined, no change in status, stable for surgery.  I have reviewed the patient's chart and labs.  Questions were answered to the patient's satisfaction.     Lyn Records III

## 2016-07-05 NOTE — H&P (View-Only) (Signed)
Cardiology Consultation:   Patient ID: [name]; [id number]; June 24, 1970   Admit date: 07/04/2016 Date of Consult: 07/05/2016  Reason for Consultation: Chest pain Referring Provider:  Dr. Adela Glimpse  Primary Care Provider: Samuel Jester, DO Primary Cardiologist: Hochrein remotely, has not seen cardiology since 2012 Primary Electrophysiologist:  N/A  Patient Profile:   [name] is a 46 y.o. male with CAD (anterior MI s/p thrombectomy/BMS to mLAD 01/2009, ICM, DM, HTN, HLD, probable CKD II (Cr baseline 1.2-1.4), medication noncompliance, remote alcohol abuse whom we are asked to see for chest pain.  History of Present Illness:   Yesterday evening while relaxing on the couch, he developed substernal chest heaviness with radiation to his arms, described as pressure. This was similar to prior MI but less severe. He took 2 NTG and aspirin with no relief initially but by the time EMS arrived, his chest pain resolved. He denies any SOB, swelling, nausea or syncope. He has not seen cardiology since a hospital admission in 2012 in which he had a nuc showing moderate-large scar with minimal peri-infarct ischemia, felt to be low risk. Prior cath showed only mild nonobstructive disease otherwise but EF is known to be 40% range from prior nuc. Labs show neg trop x 3, normal CBC, CBG 174, Cr 1.32, UDS neg. CXR NAD. BP 128-146 systolic. Currently pain free.   Past Medical History:  Diagnosis Date  . Anterior myocardial infarction [facility])    November 2010  . CAD (coronary artery disease), native coronary artery    Thrombectomy with BMS to mid LAD November 2010  . Essential hypertension   . History of nonadherence to medical treatment   . Type 2 diabetes mellitus (HCC)     Past Surgical History:  Procedure Laterality Date  . Knee arthroscopic surgery Right      Inpatient Medications: Scheduled Meds: . atorvastatin  40 mg Oral Daily  . citalopram  20 mg Oral Daily  . enoxaparin (LOVENOX)  injection  40 mg Subcutaneous Q24H  . insulin aspart  0-9 Units Subcutaneous Q4H   Continuous Infusions:  PRN Meds: acetaminophen, morphine injection, ondansetron (ZOFRAN) IV  Allergies:   No Known Allergies  Social History:   Social History   Social History  . Marital status: Married    Spouse name: N/A  . Number of children: N/A  . Years of education: N/A   Social History Main Topics  . Smoking status: Never Smoker  . Smokeless tobacco: Never Used  . Alcohol use No  . Drug use: No  . Sexual activity: Yes   Other Topics Concern  . None   Social History Narrative   Married with one child   Lives in [place], [place]    Family History:   Family History  Problem Relation Age of Onset  . Diabetes Mellitus II       ROS:  Please see the history of present illness.  All other ROS reviewed and negative.     Physical Exam/Data:   Vitals:   07/05/16 0030 07/05/16 0100 07/05/16 0216 07/05/16 0500  BP: (!) 141/92 (!) 146/94 (!) 136/97   Pulse: 71 79 82 65  Resp: (!) 22 (!) 23 (!) 26 20  Temp:   99 F (37.2 C)   SpO2: 99% 100% 94% 98%  Weight:   167 lb 3.2 oz (75.8 kg)   Height:    (1.753 m)     Intake/Output Summary (Last 24 hours) at 07/05/16 0807 Last data filed at 07/05/16 0131  Gross per 24 hour  Intake             1000 ml  Output                0 ml  Net             1000 ml   Filed Weights   07/05/16 0216  Weight: 167 lb 3.2 oz (75.8 kg)    General:  Well nourished, well developed M, in no acute distress HEENT: normal Lymph: no adenopathy Neck: no JVD Endocrine:  No thryomegaly Vascular: No carotid bruits, pedal pulses in tact Cardiac:  normal S1, S2; RRR; no murmurs Lungs:  clear to auscultation bilaterally, no wheezing, rhonchi or rales  Abd: soft, nontender, no hepatomegaly  Ext: no edema Musculoskeletal:  No deformities, BUE and BLE strength normal and equal Skin: warm and dry  Neuro:  CNs 2-12 intact, no focal abnormalities noted Psych:   Normal affect    EKG:  The EKG was personally reviewed and demonstrates EKG NSR with prior inferior and anterolateral infarct with biphasic appearing TWI V2-V6 - similar to 2011 (but this AM's EKG appears more pronounced than yesterday AM).   Relevant CV Studies: See prior cath (scanned in)  Laboratory Data:  Chemistry  Recent Labs Lab 07/04/16 2220  NA 136  K 4.1  CL 104  CO2 25  GLUCOSE 174*  BUN 8  CREATININE 1.32*  CALCIUM 8.7*  GFRNONAA >60  GFRAA >60  ANIONGAP 7    Hematology  Recent Labs Lab 07/04/16 2220  WBC 8.5  RBC 5.28  HGB 14.3  HCT 42.3  MCV 80.1  MCH 27.1  MCHC 33.8  RDW 13.1  PLT 242   Cardiac Enzymes  Recent Labs Lab 07/05/16 0313 07/05/16 0608  TROPONINI <0.03 <0.03     Recent Labs Lab 07/04/16 2249  TROPIPOC 0.00    Radiology/Studies:  Dg Chest 2 View  Result Date: 07/04/2016 CLINICAL DATA:  Chest pain with dyspnea radiating across a chest EXAM: CHEST  2 VIEW COMPARISON:  11/07/2014 FINDINGS: The heart size and mediastinal contours are within normal limits. Both lungs are clear. The visualized skeletal structures are unremarkable. IMPRESSION: No active cardiopulmonary disease. Electronically Signed   By: Tollie Eth M.D.   On: 07/04/2016 23:05    Assessment and Plan:   The patient was seen/examined by Dr. Eden Emms.  1. Chest pain - concerning for angina in the setting of his prior CAD. Per Dr. Eden Emms, recommend cath for definitive eval - Risks and benefits of cardiac catheterization have been discussed with the patient. These include bleeding, infection, kidney damage, stroke, heart attack, death. The patient understands these risks and is willing to proceed. Per discussion with cath lab they should be able to get to him his afternoon. Will allow clear liquid breakfast, NPO afterwards, and start 100cc/hr around 11am. It does appear he got some IVF in the ED as well. If his cath is delayed will need to stop IV fluids given his LV  dysfunction. Also await echocardiogram. Will add daily aspirin  daily (received ASA PTA yesterday). Hold off BB given his HR 60s on telemetry. With neg enzymes and no further chest pain will hold off heparin per pharmacy.  2. Known CAD - see above.   3. Hyperlipidemia - titrate statin to high dose given DM and CAD. Add LFTs and lipids to upcoming labs.  4. Essential HTN - consider initiation of ACEI/ARB if creatinine is stable after cath given  DM and probable CKD.  5. ICM - no clinical signs of CHF. Have requested cath lab to avoid LV gram. Await echo.   6. Probable CKD stage II - follow.  Code status: the patient was listed as DNR. I reviewed this with him this morning and he reverts this decision to full code by his own volition. I'm not sure he understood yesterday what it meant to be a DNR, but today he affirms he would want everything done to resuscitate him in the event of an emergency.   Signed, Laurann Montana, PA-C  07/05/2016 8:07 AM   Patient examined chart reviewed. Discussed care with multiple family members. Known ischemic DCM with poor f/u. Admitted with chest pain r/o ECG with chronic changes of anterolateral wall infarct with loss of R waves and biphasic T waves. Exam with no murmur clear lungs increased PMI no edema good right radial pulse. Plan for heart cath today to define anatomy given previous MI with BMS LAD and low EF.  Try to get echo to evaluate EF Agree at his age he should not be a DNR  Charlton Haws

## 2016-07-05 NOTE — Progress Notes (Signed)
Patient seen and examined  46 y.o. male with CAD (anterior MI s/p thrombectomy/BMS to mLAD 01/2009, ICM, DM, HTN, HLD, probable CKD II (Cr baseline 1.2-1.4), medication noncompliance, remote alcohol abuse whom we are asked to see for chest pain  Assessment and plan  Chest pain -but cardiac enzymes negative - given risk factors continue telemetry, appreciate cardiology recommendations. Cardiac cath recommended after IV hydration completed. 2-D echo to rule out wall motion abnormalities. Aspirin 81 mg daily. Hold off on beta blocker as heart rate is in the 60s.. Further treatment based on the currently pending results. UDS negative   . CORONARY ATHEROSCLEROSIS NATIVE CORONARY ARTERY - Continue aspirin and statin  . Essential hypertension -  blood pressure stable monitor  . Type 2 diabetes mellitus, uncontrolled (HCC) - hold PO meds order sliding scale. Check hemoglobin A1c  CK D stage II-baseline creatinine around 1.4, creatinine at baseline, follow closely after cardiac catheterization

## 2016-07-05 NOTE — H&P (Addendum)
Emmette Katt ZOX:[id number] DOB: 24-Dec-1970 DOA: 07/04/2016     PCP: Samuel Jester, DO   Outpatient Specialists: [facility] 4 years ago Patient coming from:    home Lives   With family    Chief Complaint: Chest pain  HPI: [name] is a 46 y.o. male with medical history significant of CAD, HTN, HLD     Presented with  Central chest pain. started while relaxing on the couch. Felt like something was sitting on his chest felt heavy. Similar but less severe and did not radiate to his arms.    Took 2 Nitro and aspirin but no pain relief, radiating across the chest. Pain has resolved prior to EMS arrival. He is unsure what made it better.   Pain felt pressure like. Denies SOB, no leg swelling no nausea no diarrhea   Regarding pertinent Chronic problems: CAD sp stents in 2010 have not followed up with cardiology for the past 4 years.  Has hx of DM reports good blood glucose control.   IN ER:  No data recorded.    RR 12 99% Hr 81 BP 140 79 Trop 0.00 NA 136 K 4.1 Cr 1.32 slightly up from baseline 1.16 WBC 8.5 Hg 14.3   CXR non acute Following Medications were ordered in ER: Medications  sodium chloride 0.9 % bolus 1,000 mL (1,000 mLs Intravenous New Bag/Given 07/04/16 2355)     Hospitalist was called for admission for chest pain evaluation  Review of Systems:    Pertinent positives include: chest pain,   Constitutional:  No weight loss, night sweats, Fevers, chills, fatigue, weight loss  HEENT:  No headaches, Difficulty swallowing,Tooth/dental problems,Sore throat,  No sneezing, itching, ear ache, nasal congestion, post nasal drip,  Cardio-vascular:  No Orthopnea, PND, anasarca, dizziness, palpitations.no Bilateral lower extremity swelling  GI:  No heartburn, indigestion, abdominal pain, nausea, vomiting, diarrhea, change in bowel habits, loss of appetite, melena, blood in stool, hematemesis Resp:  no shortness of breath at rest. No dyspnea on exertion, No excess  mucus, no productive cough, No non-productive cough, No coughing up of blood.No change in color of mucus.No wheezing. Skin:  no rash or lesions. No jaundice GU:  no dysuria, change in color of urine, no urgency or frequency. No straining to urinate.  No flank pain.  Musculoskeletal:  No joint pain or no joint swelling. No decreased range of motion. No back pain.  Psych:  No change in mood or affect. No depression or anxiety. No memory loss.  Neuro: no localizing neurological complaints, no tingling, no weakness, no double vision, no gait abnormality, no slurred speech, no confusion  As per HPI otherwise 10 point review of systems negative.   Past Medical History: Past Medical History:  Diagnosis Date  . Anterior myocardial infarction [facility])    November 2010  . CAD (coronary artery disease), native coronary artery    Thrombectomy with BMS to mid LAD November 2010  . Essential hypertension   . History of nonadherence to medical treatment   . Type 2 diabetes mellitus (HCC)    Past Surgical History:  Procedure Laterality Date  . Knee arthroscopic surgery Right      Social History:  Ambulatory  independently     reports that he has never smoked. He does not have any smokeless tobacco history on file. He reports that he drinks about 0.6 oz of alcohol per week . He reports that he does not use drugs.  Allergies:  No Known Allergies  Family History:   Family History  Problem Relation Age of Onset  . Diabetes Mellitus II      Medications: Prior to Admission medications   Medication Sig Start Date End Date Taking? Authorizing Provider  atorvastatin (LIPITOR) 40 MG tablet Take 40 mg by mouth daily.   Yes Historical Provider, MD  citalopram (CELEXA) 20 MG tablet Take 1 tablet (20 mg total) by mouth daily. For depression and irritability. 08/24/11  Yes Viviann Spare, FNP  glipiZIDE (GLUCOTROL) 10 MG tablet Take 10 mg by mouth daily before breakfast.   Yes Historical  Provider, MD  hydrOXYzine (ATARAX/VISTARIL) 25 MG tablet Take 25 mg by mouth 3 (three) times daily.   Yes Historical Provider, MD  metFORMIN (GLUCOPHAGE) 500 MG tablet Take 1 tablet (500 mg total) by mouth 2 (two) times daily with a meal. For diabetes 08/24/11  Yes Viviann Spare, FNP  nitroGLYCERIN (NITROSTAT) 0.4 MG SL tablet Place 1 tablet (0.4 mg total) under the tongue every 5 (five) minutes as needed for chest pain. For chest pain. History of MI. 08/24/11  Yes Viviann Spare, FNP  pioglitazone (ACTOS) 30 MG tablet Take 30 mg by mouth daily.   Yes Historical Provider, MD  sitaGLIPtin (JANUVIA) 50 MG tablet Take 50 mg by mouth daily.   Yes Historical Provider, MD    Physical Exam: Patient Vitals for the past 24 hrs:  BP Pulse Resp SpO2  07/04/16 2243 140/79 81 (!) 22 99 %  07/04/16 2211 - - - 98 %    1. General:  in No Acute distress 2. Psychological: Alert and  Oriented 3. Head/ENT:   Moist   Mucous Membranes                          Head Non traumatic, neck supple                            Poor Dentition 4. SKIN: decreased Skin turgor,  Skin clean Dry and intact no rash 5. Heart: Regular rate and rhythm no  Murmur, Rub or gallop 6. Lungs:   no wheezes or crackles   7. Abdomen: Soft,  non-tender, Non distended 8. Lower extremities: no clubbing, cyanosis, or edema 9. Neurologically Grossly intact, moving all 4 extremities equally   10. MSK: Normal range of motion   body mass index is unknown because there is no height or weight on file.  Labs on Admission:   Labs on Admission: I have personally reviewed following labs and imaging studies  CBC:  Recent Labs Lab 07/04/16 2220  WBC 8.5  HGB 14.3  HCT 42.3  MCV 80.1  PLT 242   Basic Metabolic Panel:  Recent Labs Lab 07/04/16 2220  NA 136  K 4.1  CL 104  CO2 25  GLUCOSE 174*  BUN 8  CREATININE 1.32*  CALCIUM 8.7*   GFR: CrCl cannot be calculated (Unknown ideal weight.). Liver Function Tests: No results  for input(s): AST, ALT, ALKPHOS, BILITOT, PROT, ALBUMIN in the last 168 hours. No results for input(s): LIPASE, AMYLASE in the last 168 hours. No results for input(s): AMMONIA in the last 168 hours. Coagulation Profile: No results for input(s): INR, PROTIME in the last 168 hours. Cardiac Enzymes: No results for input(s): CKTOTAL, CKMB, CKMBINDEX, TROPONINI in the last 168 hours. BNP (last 3 results) No results for input(s): PROBNP in the last 8760 hours. HbA1C: No results  for input(s): HGBA1C in the last 72 hours. CBG: No results for input(s): GLUCAP in the last 168 hours. Lipid Profile: No results for input(s): CHOL, HDL, LDLCALC, TRIG, CHOLHDL, LDLDIRECT in the last 72 hours. Thyroid Function Tests: No results for input(s): TSH, T4TOTAL, FREET4, T3FREE, THYROIDAB in the last 72 hours. Anemia Panel: No results for input(s): VITAMINB12, FOLATE, FERRITIN, TIBC, IRON, RETICCTPCT in the last 72 hours. Urine analysis: No results found for: COLORURINE, APPEARANCEUR, LABSPEC, PHURINE, GLUCOSEU, HGBUR, BILIRUBINUR, KETONESUR, PROTEINUR, UROBILINOGEN, NITRITE, LEUKOCYTESUR Sepsis Labs: (procalcitonin:4,lacticidven:4) )No results found for this or any previous visit (from the past 240 hour(s)).       UA not ordered  Lab Results  Component Value Date   HGBA1C (H) 05/04/2010    9.3 (NOTE)                                                                       According to the ADA Clinical Practice Recommendations for 2011, when HbA1c is used as a screening test:   >=6.5%   Diagnostic of Diabetes Mellitus           (if abnormal result  is confirmed)  5.7-6.4%   Increased risk of developing Diabetes Mellitus  References:Diagnosis and Classification of Diabetes Mellitus,Diabetes Care,2011,34(Suppl 1):S62-S69 and Standards of Medical Care in         Diabetes - 2011,Diabetes Care,2011,34  (Suppl 1):S11-S61.    CrCl cannot be calculated (Unknown ideal weight.).  BNP (last 3  results) No results for input(s): PROBNP in the last 8760 hours.   ECG REPORT  Independently reviewed Rate: 82  Rhythm:  ST&T Change: ST segments changes improved from prior QTC 468  There were no vitals filed for this visit.   Cultures:    Component Value Date/Time   SDES BLOOD RIGHT ARM 02/01/2009 2235   SPECREQUEST BOTTLES DRAWN AEROBIC AND ANAEROBIC 10CC EACH 02/01/2009 2235   CULT NO GROWTH 5 DAYS 02/01/2009 2235   REPTSTATUS 02/08/2009 FINAL 02/01/2009 2235     Radiological Exams on Admission: Dg Chest 2 View  Result Date: 07/04/2016 CLINICAL DATA:  Chest pain with dyspnea radiating across a chest EXAM: CHEST  2 VIEW COMPARISON:  11/07/2014 FINDINGS: The heart size and mediastinal contours are within normal limits. Both lungs are clear. The visualized skeletal structures are unremarkable. IMPRESSION: No active cardiopulmonary disease. Electronically Signed   By: Tollie Eth M.D.   On: 07/04/2016 23:05    Chart has been reviewed    Assessment/Plan  46 y.o. male with medical history significant of CAD, HTN, HLD admitted for evaluation of chest pain      Present on Admission:  . Chest pain - - given risk factors will admit, monitor on telemetry, cycle cardiac enzymes, obtain serial ECG. Further risk stratify with lipid panel, hgA1C, obtain TSH. Make sure patient is on Aspirin. Further treatment based on the currently pending results.  . CORONARY ATHEROSCLEROSIS NATIVE CORONARY ARTERY - Continue aspirin and statin . Essential hypertension - make sure patient is on betablocker if able to tolerate hold off for tonight incase will need a stress test . Type 2 diabetes mellitus, uncontrolled (HCC) - hold PO meds order sliding scale   Other plan as per orders.  DVT prophylaxis:  Lovenox     Code Status:    DNR/DNI  s per patient  family is aware  Family Communication:   Family  at  Bedside  plan of care was discussed with    Son, Daughter  Disposition Plan:    To  home once workup is complete and patient is stable                                                  Consults called: email cardiology  Admission status: obs   Level of care     tele           I have spent a total of on this admission  Zephaniah Enyeart 07/05/2016, 1:12 AM    [facility]  Pager [phone]   after 2 AM please page floor coverage PA If 7AM-7PM, please contact the day team taking care of the patient  [email or web address]  Password [email or web address]

## 2016-07-05 NOTE — Progress Notes (Signed)
Spoke with patient's son earlier this afternoon stating there is a visitor in patient's room whom patient is wanting to leave and the visitor is refusing.  I went to patient's room where two females were visiting and asked patient if he was ok and if he wanted any of the visitors to leave.  Patient stated he was fine and wanted both visitors to stay and there was not any issues.  Explained to son, that patient was ok with sister staying at his bedside.  Later upon leaving patient's wife and son continued to state, patient wanted sister to leave and she was unwilling.  Again staff went down to room and spoke with patient alone, while sister was outside of room, and again patient stated sister was ok to stay at bedside and to stay the night. Moments later received a telephone call from wife very upset that sister was not made to leave.  Explained to wife, patient is stating he is fine with sister staying at bedside.  Wife continued to be upset and concerned for her husband.  Nursing staff asked sister to please leave.  Sister stating she does not have a way home.  Requested that sister continue to call for family to come pick her up and to wait in the waiting area for her ride home.  Wife and son updated.  Patient agreeable to no visitors at this time. Colman Cater

## 2016-07-05 NOTE — Progress Notes (Signed)
Patient developed chest pain 9/10 around 2048. Nitroglycerin gtt was titrated up to 80 mcg/min. Unable to titrate further due to soft blood pressures. EKG done at the bedside. Morphine was given at 2100 and pain came down to 8/10. Patient got up to go to the bathroom and on the way back became dizzy and RN assisted back in bed. He c/o feeling very sweaty. Cardiology on call Dr. Alberteen Spindle notified and she came to the bedside about 2130. Troponin cycle was ordered and heparin gtt restarted. She consulted with Dr. Herbie Baltimore. Right now the plan is to hold off on going emergently to the cath lab tonight. Will continue to try to get patient chest pain free and notify MD as needed.

## 2016-07-05 NOTE — Progress Notes (Addendum)
ANTICOAGULATION CONSULT NOTE - Initial Consult  Pharmacy Consult for heparin Indication: chest pain/ACS  No Known Allergies  Patient Measurements: Height:  (175.3 cm) Weight: 167 lb 3.2 oz (75.8 kg) IBW/kg (Calculated) : 70.7 Heparin Dosing Weight: 75kg  Vital Signs: Temp: 98.3 F (36.8 C) (04/12 0816) Temp Source: Oral (04/12 0816) BP: 121/81 (04/12 1613) Pulse Rate: 0 (04/12 1618)  Labs:  Recent Labs  07/04/16 2220 07/05/16 0313 07/05/16 0608 07/05/16 0926  HGB 14.3  --   --   --   HCT 42.3  --   --   --   PLT 242  --   --   --   LABPROT  --   --   --  14.7  INR  --   --   --  1.14  CREATININE 1.32*  --   --   --   TROPONINI  --  <0.03 <0.03 <0.03    Estimated Creatinine Clearance: 71.4 mL/min (A) (by C-G formula based on SCr of 1.32 mg/dL (H)).   Assessment: 4 YOM s/p cath- unable to get PCI to LAD d/t difficulties with guide catheter. To go back to cath lab tomorrow for R femoral PCI vs revascularization via CTO team. To start heparin 8 hours post sheath removal. Sheath documented as out at 1615 this afternoon. hgb and plts normal before PCI. No excessive bleeding noted.  Goal of Therapy:  Heparin level 0.3-0.7 units/ml Monitor platelets by anticoagulation protocol: Yes   Plan:  Start heparin at 0030 at rate of 900 units/hr First heparin level and CBC at 0630 Daily heparin level and CBC starting 4/14  Issabelle Mcraney D. Cheyane Ayon, PharmD, BCPS Clinical Pharmacist Pager: [phone] 07/05/2016 4:44 PM

## 2016-07-05 NOTE — ED Notes (Signed)
Report attempted x 1

## 2016-07-05 NOTE — Consult Note (Signed)
Cardiology Consultation:   Patient ID: [name]; [id number]; 07/20/1970   Admit date: 07/04/2016 Date of Consult: 07/05/2016  Reason for Consultation: Chest pain Referring Provider:  Dr. Doutova  Primary Care Provider: CYNTHIA BUTLER, DO Primary Cardiologist: Hochrein remotely, has not seen cardiology since 2012 Primary Electrophysiologist:  N/A  Patient Profile:   [name] is a 46 y.o. male with CAD (anterior MI s/p thrombectomy/BMS to mLAD 01/2009, ICM, DM, HTN, HLD, probable CKD II (Cr baseline 1.2-1.4), medication noncompliance, remote alcohol abuse whom we are asked to see for chest pain.  History of Present Illness:   Yesterday evening while relaxing on the couch, he developed substernal chest heaviness with radiation to his arms, described as pressure. This was similar to prior MI but less severe. He took 2 NTG and aspirin with no relief initially but by the time EMS arrived, his chest pain resolved. He denies any SOB, swelling, nausea or syncope. He has not seen cardiology since a hospital admission in 2012 in which he had a nuc showing moderate-large scar with minimal peri-infarct ischemia, felt to be low risk. Prior cath showed only mild nonobstructive disease otherwise but EF is known to be 40% range from prior nuc. Labs show neg trop x 3, normal CBC, CBG 174, Cr 1.32, UDS neg. CXR NAD. BP 128-146 systolic. Currently pain free.   Past Medical History:  Diagnosis Date  . Anterior myocardial infarction (HCC)    November 2010  . CAD (coronary artery disease), native coronary artery    Thrombectomy with BMS to mid LAD November 2010  . Essential hypertension   . History of nonadherence to medical treatment   . Type 2 diabetes mellitus (HCC)     Past Surgical History:  Procedure Laterality Date  . Knee arthroscopic surgery Right      Inpatient Medications: Scheduled Meds: . atorvastatin  40 mg Oral Daily  . citalopram  20 mg Oral Daily  . enoxaparin (LOVENOX)  injection  40 mg Subcutaneous Q24H  . insulin aspart  0-9 Units Subcutaneous Q4H   Continuous Infusions:  PRN Meds: acetaminophen, morphine injection, ondansetron (ZOFRAN) IV  Allergies:   No Known Allergies  Social History:   Social History   Social History  . Marital status: Married    Spouse name: N/A  . Number of children: N/A  . Years of education: N/A   Social History Main Topics  . Smoking status: Never Smoker  . Smokeless tobacco: Never Used  . Alcohol use No  . Drug use: No  . Sexual activity: Yes   Other Topics Concern  . None   Social History Narrative   Married with one child   Lives in [place], [place]    Family History:   Family History  Problem Relation Age of Onset  . Diabetes Mellitus II       ROS:  Please see the history of present illness.  All other ROS reviewed and negative.     Physical Exam/Data:   Vitals:   07/05/16 0030 07/05/16 0100 07/05/16 0216 07/05/16 0500  BP: (!) 141/92 (!) 146/94 (!) 136/97   Pulse: 71 79 82 65  Resp: (!) 22 (!) 23 (!) 26 20  Temp:   99 F (37.2 C)   SpO2: 99% 100% 94% 98%  Weight:   167 lb 3.2 oz (75.8 kg)   Height:   5' 9" (1.753 m)     Intake/Output Summary (Last 24 hours) at 07/05/16 0807 Last data filed at 07/05/16 0131    Gross per 24 hour  Intake             1000 ml  Output                0 ml  Net             1000 ml   Filed Weights   07/05/16 0216  Weight: 167 lb 3.2 oz (75.8 kg)    General:  Well nourished, well developed M, in no acute distress HEENT: normal Lymph: no adenopathy Neck: no JVD Endocrine:  No thryomegaly Vascular: No carotid bruits, pedal pulses in tact Cardiac:  normal S1, S2; RRR; no murmurs Lungs:  clear to auscultation bilaterally, no wheezing, rhonchi or rales  Abd: soft, nontender, no hepatomegaly  Ext: no edema Musculoskeletal:  No deformities, BUE and BLE strength normal and equal Skin: warm and dry  Neuro:  CNs 2-12 intact, no focal abnormalities noted Psych:   Normal affect    EKG:  The EKG was personally reviewed and demonstrates EKG NSR with prior inferior and anterolateral infarct with biphasic appearing TWI V2-V6 - similar to 2011 (but this AM's EKG appears more pronounced than yesterday AM).   Relevant CV Studies: See prior cath (scanned in)  Laboratory Data:  Chemistry  Recent Labs Lab 07/04/16 2220  NA 136  K 4.1  CL 104  CO2 25  GLUCOSE 174*  BUN 8  CREATININE 1.32*  CALCIUM 8.7*  GFRNONAA >60  GFRAA >60  ANIONGAP 7    Hematology  Recent Labs Lab 07/04/16 2220  WBC 8.5  RBC 5.28  HGB 14.3  HCT 42.3  MCV 80.1  MCH 27.1  MCHC 33.8  RDW 13.1  PLT 242   Cardiac Enzymes  Recent Labs Lab 07/05/16 0313 07/05/16 0608  TROPONINI <0.03 <0.03     Recent Labs Lab 07/04/16 2249  TROPIPOC 0.00    Radiology/Studies:  Dg Chest 2 View  Result Date: 07/04/2016 CLINICAL DATA:  Chest pain with dyspnea radiating across a chest EXAM: CHEST  2 VIEW COMPARISON:  11/07/2014 FINDINGS: The heart size and mediastinal contours are within normal limits. Both lungs are clear. The visualized skeletal structures are unremarkable. IMPRESSION: No active cardiopulmonary disease. Electronically Signed   By: David  Kwon M.D.   On: 07/04/2016 23:05    Assessment and Plan:   The patient was seen/examined by Dr. Rawan Riendeau.  1. Chest pain - concerning for angina in the setting of his prior CAD. Per Dr. Jermika Olden, recommend cath for definitive eval - Risks and benefits of cardiac catheterization have been discussed with the patient. These include bleeding, infection, kidney damage, stroke, heart attack, death. The patient understands these risks and is willing to proceed. Per discussion with cath lab they should be able to get to him his afternoon. Will allow clear liquid breakfast, NPO afterwards, and start 100cc/hr around 11am. It does appear he got some IVF in the ED as well. If his cath is delayed will need to stop IV fluids given his LV  dysfunction. Also await echocardiogram. Will add daily aspirin 81mg daily (received ASA PTA yesterday). Hold off BB given his HR 60s on telemetry. With neg enzymes and no further chest pain will hold off heparin per pharmacy.  2. Known CAD - see above.   3. Hyperlipidemia - titrate statin to high dose given DM and CAD. Add LFTs and lipids to upcoming labs.  4. Essential HTN - consider initiation of ACEI/ARB if creatinine is stable after cath given   DM and probable CKD.  5. ICM - no clinical signs of CHF. Have requested cath lab to avoid LV gram. Await echo.   6. Probable CKD stage II - follow.  Code status: the patient was listed as DNR. I reviewed this with him this morning and he reverts this decision to full code by his own volition. I'm not sure he understood yesterday what it meant to be a DNR, but today he affirms he would want everything done to resuscitate him in the event of an emergency.   Signed, Dayna N Dunn, PA-C  07/05/2016 8:07 AM   Patient examined chart reviewed. Discussed care with multiple family members. Known ischemic DCM with poor f/u. Admitted with chest pain r/o ECG with chronic changes of anterolateral wall infarct with loss of R waves and biphasic T waves. Exam with no murmur clear lungs increased PMI no edema good right radial pulse. Plan for heart cath today to define anatomy given previous MI with BMS LAD and low EF.  Try to get echo to evaluate EF Agree at his age he should not be a DNR  Ena Demary   

## 2016-07-06 ENCOUNTER — Encounter (HOSPITAL_COMMUNITY): Payer: Self-pay | Admitting: Interventional Cardiology

## 2016-07-06 ENCOUNTER — Inpatient Hospital Stay (HOSPITAL_COMMUNITY): Payer: Medicaid Other

## 2016-07-06 DIAGNOSIS — E1165 Type 2 diabetes mellitus with hyperglycemia: Secondary | ICD-10-CM

## 2016-07-06 DIAGNOSIS — E1121 Type 2 diabetes mellitus with diabetic nephropathy: Secondary | ICD-10-CM

## 2016-07-06 LAB — COMPREHENSIVE METABOLIC PANEL
ALK PHOS: 46 U/L (ref 38–126)
ALT: 20 U/L (ref 17–63)
ANION GAP: 10 (ref 5–15)
AST: 30 U/L (ref 15–41)
Albumin: 3.3 g/dL — ABNORMAL LOW (ref 3.5–5.0)
BILIRUBIN TOTAL: 0.8 mg/dL (ref 0.3–1.2)
BUN: 17 mg/dL (ref 6–20)
CALCIUM: 8.4 mg/dL — AB (ref 8.9–10.3)
CO2: 24 mmol/L (ref 22–32)
Chloride: 103 mmol/L (ref 101–111)
Creatinine, Ser: 2.16 mg/dL — ABNORMAL HIGH (ref 0.61–1.24)
GFR calc non Af Amer: 35 mL/min — ABNORMAL LOW (ref >60)
GFR, EST AFRICAN AMERICAN: 41 mL/min — AB (ref >60)
Glucose, Bld: 222 mg/dL — ABNORMAL HIGH (ref 65–99)
Potassium: 4.6 mmol/L (ref 3.5–5.1)
Sodium: 137 mmol/L (ref 135–145)
TOTAL PROTEIN: 5.7 g/dL — AB (ref 6.5–8.1)

## 2016-07-06 LAB — CBC
HCT: 36.3 % — ABNORMAL LOW (ref 39.0–52.0)
HEMOGLOBIN: 12.2 g/dL — AB (ref 13.0–17.0)
MCH: 27.2 pg (ref 26.0–34.0)
MCHC: 33.6 g/dL (ref 30.0–36.0)
MCV: 81 fL (ref 78.0–100.0)
PLATELETS: 221 10*3/uL (ref 150–400)
RBC: 4.48 MIL/uL (ref 4.22–5.81)
RDW: 13.7 % (ref 11.5–15.5)
WBC: 13.8 10*3/uL — ABNORMAL HIGH (ref 4.0–10.5)

## 2016-07-06 LAB — LIPID PANEL
Cholesterol: 115 mg/dL (ref 0–200)
HDL: 54 mg/dL (ref >40)
LDL CALC: 42 mg/dL (ref 0–99)
Total CHOL/HDL Ratio: 2.1 RATIO
Triglycerides: 94 mg/dL (ref <150)
VLDL: 19 mg/dL (ref 0–40)

## 2016-07-06 LAB — BASIC METABOLIC PANEL
Anion gap: 11 (ref 5–15)
Anion gap: 16 — ABNORMAL HIGH (ref 5–15)
BUN: 19 mg/dL (ref 6–20)
BUN: 20 mg/dL (ref 6–20)
CALCIUM: 8.3 mg/dL — AB (ref 8.9–10.3)
CALCIUM: 8.7 mg/dL — AB (ref 8.9–10.3)
CO2: 21 mmol/L — AB (ref 22–32)
CO2: 23 mmol/L (ref 22–32)
CREATININE: 2.09 mg/dL — AB (ref 0.61–1.24)
CREATININE: 2.03 mg/dL — AB (ref 0.61–1.24)
Chloride: 105 mmol/L (ref 101–111)
Chloride: 100 mmol/L — ABNORMAL LOW (ref 101–111)
GFR calc non Af Amer: 37 mL/min — ABNORMAL LOW (ref >60)
GFR calc non Af Amer: 38 mL/min — ABNORMAL LOW (ref >60)
GFR, EST AFRICAN AMERICAN: 43 mL/min — AB (ref >60)
GFR, EST AFRICAN AMERICAN: 44 mL/min — AB (ref >60)
GLUCOSE: 209 mg/dL — AB (ref 65–99)
GLUCOSE: 173 mg/dL — AB (ref 65–99)
Potassium: 4.5 mmol/L (ref 3.5–5.1)
Potassium: 4.2 mmol/L (ref 3.5–5.1)
Sodium: 137 mmol/L (ref 135–145)
Sodium: 139 mmol/L (ref 135–145)

## 2016-07-06 LAB — GLUCOSE, CAPILLARY
GLUCOSE-CAPILLARY: 208 mg/dL — AB (ref 65–99)
GLUCOSE-CAPILLARY: 253 mg/dL — AB (ref 65–99)
Glucose-Capillary: 164 mg/dL — ABNORMAL HIGH (ref 65–99)
Glucose-Capillary: 180 mg/dL — ABNORMAL HIGH (ref 65–99)
Glucose-Capillary: 199 mg/dL — ABNORMAL HIGH (ref 65–99)
Glucose-Capillary: 222 mg/dL — ABNORMAL HIGH (ref 65–99)

## 2016-07-06 LAB — HEPARIN LEVEL (UNFRACTIONATED)
Heparin Unfractionated: <0.1 IU/mL — ABNORMAL LOW (ref 0.30–0.70)
Heparin Unfractionated: <0.1 IU/mL — ABNORMAL LOW (ref 0.30–0.70)

## 2016-07-06 LAB — LACTIC ACID, PLASMA: Lactic Acid, Venous: 2.2 mmol/L (ref 0.5–1.9)

## 2016-07-06 MED ORDER — CLOPIDOGREL BISULFATE 75 MG PO TABS
600.0000 mg | ORAL_TABLET | Freq: Once | ORAL | Status: AC
  Administered 2016-07-06: 600 mg via ORAL
  Filled 2016-07-06: qty 8

## 2016-07-06 MED ORDER — HEPARIN (PORCINE) IN NACL 100-0.45 UNIT/ML-% IJ SOLN: 900.0000 [IU]/h | INTRAMUSCULAR | Status: DC

## 2016-07-06 MED ORDER — ASPIRIN 81 MG PO CHEW
81.0000 mg | CHEWABLE_TABLET | ORAL | Status: AC
Start: 2016-07-07 — End: 2016-07-08

## 2016-07-06 MED ORDER — SODIUM BICARBONATE 8.4 % IV SOLN
INTRAVENOUS | Status: AC
  Administered 2016-07-06: 10:00:00 via INTRAVENOUS
  Filled 2016-07-06: qty 1000

## 2016-07-06 MED ORDER — SODIUM CHLORIDE 0.9 % IV SOLN: 250.0000 mL | INTRAVENOUS | Status: DC | PRN

## 2016-07-06 MED ORDER — SODIUM CHLORIDE 0.9 % IV SOLN: INTRAVENOUS | Status: DC

## 2016-07-06 MED ORDER — SODIUM BICARBONATE BOLUS VIA INFUSION
INTRAVENOUS | Status: AC
  Administered 2016-07-06: 225 meq via INTRAVENOUS
  Filled 2016-07-06: qty 1

## 2016-07-06 MED ORDER — SODIUM CHLORIDE 0.9% FLUSH: 3.0000 mL | INTRAVENOUS | Status: DC | PRN

## 2016-07-06 MED ORDER — DEXTROSE 5 % IV SOLN
1.0000 g | INTRAVENOUS | Status: DC
  Administered 2016-07-06: 1 g via INTRAVENOUS
  Filled 2016-07-06 (×2): qty 10

## 2016-07-06 MED ORDER — ACETAMINOPHEN 325 MG PO TABS
650.0000 mg | ORAL_TABLET | Freq: Four times a day (QID) | ORAL | Status: DC | PRN
  Administered 2016-07-06: 650 mg via ORAL
  Filled 2016-07-06: qty 2

## 2016-07-06 MED ORDER — SODIUM CHLORIDE 0.9 % IV SOLN
INTRAVENOUS | Status: AC
  Administered 2016-07-06: 21:00:00 via INTRAVENOUS

## 2016-07-06 MED ORDER — SODIUM CHLORIDE 0.9% FLUSH
3.0000 mL | Freq: Two times a day (BID) | INTRAVENOUS | Status: DC
  Administered 2016-07-06 – 2016-07-08 (×3): 3 mL via INTRAVENOUS

## 2016-07-06 NOTE — Progress Notes (Signed)
ANTICOAGULATION CONSULT NOTE Pharmacy Consult for heparin Indication: chest pain/ACS  No Known Allergies  Patient Measurements: Height:  (175.3 cm) Weight: 172 lb 6.4 oz (78.2 kg) IBW/kg (Calculated) : 70.7 Heparin Dosing Weight: 75kg  Vital Signs: Temp: 98.7 F (37.1 C) (04/13 0412) Temp Source: Oral (04/13 0412) BP: 104/56 (04/13 0350) Pulse Rate: 77 (04/13 0412)  Labs:  Recent Labs  07/04/16 2220  07/05/16 0608 07/05/16 0926 07/05/16 2213 07/06/16 0534  HGB 14.3  --   --   --   --  12.2*  HCT 42.3  --   --   --   --  36.3*  PLT 242  --   --   --   --  221  LABPROT  --   --   --  14.7  --   --   INR  --   --   --  1.14  --   --   HEPARINUNFRC  --   --   --   --   --  <0.10*  CREATININE 1.32*  --   --   --   --  2.16*  TROPONINI  --   < > <0.03 <0.03 <0.03  --   < > = values in this interval not displayed.  Estimated Creatinine Clearance: 43.6 mL/min (A) (by C-G formula based on SCr of 2.16 mg/dL (H)).   Assessment: 46 y.o. male with CAD awaiting PCI for heparin  Goal of Therapy:  Heparin level 0.3-0.7 units/ml Monitor platelets by anticoagulation protocol: Yes   Plan:  Increase Heparin 1250 units/hr F/U after cath  Geannie Risen, PharmD, BCPS 07/06/2016 6:33 AM

## 2016-07-06 NOTE — Progress Notes (Signed)
Ceftriaxone for CAP per pharmacy ordered. Ceftriaxone does not need renal adjustment.  WBC elevated at 13.8, tmax/24h 102.7.   Plan: -ceftriaxone 1g IV q24h -pharmacy to sign off as no adjustment needed.  Caoilainn Sacks D. Bardia Wangerin, PharmD, BCPS Clinical Pharmacist Pager: [phone] 07/06/2016 9:02 PM

## 2016-07-06 NOTE — Progress Notes (Signed)
  Notified by cath lab that his procedure has been cancelled and postponed until Monday due to his AKI. Creatinine was 1.32 on admission, peaking to 2.16 this AM following cath yesterday. Started on IVF and rechecked this PM, still remains elevated at 2.03.  Updated the patient and his family at the bedside about the procedure being rescheduled and the reasoning behind this. Will continue IV Heparin and IV NTG. Procedure is scheduled on 07/09/2016 at 0900 with Dr. Allyson Sabal.  Signed, Ellsworth Lennox, PA-C 07/06/2016, 3:02 PM

## 2016-07-06 NOTE — Progress Notes (Signed)
Clarified with Randall An PA to continue Sodium Bicarb for now. Call for difficulty breathing or signs of pulmonary edema. Cont to monitor

## 2016-07-06 NOTE — Progress Notes (Signed)
Progress Note  Patient Name: [name] Date of Encounter: 07/06/2016  Primary Cardiologist: Hochrein  Subjective   No chest pain or dyspnea   Inpatient Medications    Scheduled Meds: . [START ON 07/07/2016] aspirin EC  81 mg Oral Daily  . atorvastatin  80 mg Oral Daily  . citalopram  20 mg Oral Daily  . insulin aspart  0-9 Units Subcutaneous Q4H  . sodium chloride flush  3 mL Intravenous Q12H  . sodium chloride flush  3 mL Intravenous Q12H   Continuous Infusions: . sodium chloride 1 mL/kg/hr (07/05/16 2104)  . sodium chloride    . heparin 1,250 Units/hr (07/06/16 0656)  . nitroGLYCERIN 80 mcg/min (07/05/16 2159)   PRN Meds: sodium chloride, sodium chloride, acetaminophen, morphine injection, nitroGLYCERIN, ondansetron (ZOFRAN) IV, ondansetron (ZOFRAN) IV, sodium chloride flush, sodium chloride flush   Vital Signs    Vitals:   07/06/16 0251 07/06/16 0350 07/06/16 0412 07/06/16 0650  BP: (!) 87/51 (!) 104/56  105/71  Pulse: 70  77 85  Resp:    (!) 27  Temp:   98.7 F (37.1 C)   TempSrc:   Oral   SpO2: 90%  92% 93%  Weight:   172 lb 6.4 oz (78.2 kg)   Height:        Intake/Output Summary (Last 24 hours) at 07/06/16 0742 Last data filed at 07/06/16 0300  Gross per 24 hour  Intake          1268.56 ml  Output                0 ml  Net          1268.56 ml   Filed Weights   07/05/16 0216 07/06/16 0412  Weight: 167 lb 3.2 oz (75.8 kg) 172 lb 6.4 oz (78.2 kg)    Telemetry    NSR 07/06/2016  - Personally Reviewed  ECG    SR anterolateral T changes anterior MI  - Personally Reviewed  Physical Exam  Right radial A no hematoma  GEN: No acute distress.   Neck: No JVD Cardiac: RRR, no murmurs, rubs, or gallops.  Respiratory: Clear to auscultation bilaterally. GI: Soft, nontender, non-distended  MS: No edema; No deformity. Neuro:  Nonfocal  Psych: Normal affect   Labs    Chemistry Recent Labs Lab 07/04/16 2220 07/06/16 0534  NA 136 137  K 4.1 4.6   CL 104 103  CO2 25 24  GLUCOSE 174* 222*  BUN 8 17  CREATININE 1.32* 2.16*  CALCIUM 8.7* 8.4*  PROT  --  5.7*  ALBUMIN  --  3.3*  AST  --  30  ALT  --  20  ALKPHOS  --  46  BILITOT  --  0.8  GFRNONAA >60 35*  GFRAA >60 41*  ANIONGAP 7 10     Hematology Recent Labs Lab 07/04/16 2220 07/06/16 0534  WBC 8.5 13.8*  RBC 5.28 4.48  HGB 14.3 12.2*  HCT 42.3 36.3*  MCV 80.1 81.0  MCH 27.1 27.2  MCHC 33.8 33.6  RDW 13.1 13.7  PLT 242 221    Cardiac Enzymes Recent Labs Lab 07/05/16 0313 07/05/16 0608 07/05/16 0926 07/05/16 2213  TROPONINI <0.03 <0.03 <0.03 <0.03    Recent Labs Lab 07/04/16 2249  TROPIPOC 0.00     BNPNo results for input(s): BNP, PROBNP in the last 168 hours.   DDimer No results for input(s): DDIMER in the last 168 hours.   Radiology  Dg Chest 2 View  Result Date: 07/04/2016 CLINICAL DATA:  Chest pain with dyspnea radiating across a chest EXAM: CHEST  2 VIEW COMPARISON:  11/07/2014 FINDINGS: The heart size and mediastinal contours are within normal limits. Both lungs are clear. The visualized skeletal structures are unremarkable. IMPRESSION: No active cardiopulmonary disease. Electronically Signed   By: Tollie Eth M.D.   On: 07/04/2016 23:05    Cardiac Studies   Cath reviewed 99% in stent stenosis mid LAD   Patient Profile     46 y.o. male admitted with chest pain Distant BMS LAD EF 40% r/o chronic anterolateral T wave changes for PCI LAD today Dr Katrinka Blazing Cr elevated will hydrate   Assessment & Plan    CAD:  For stent mid LAD today Cr up will do hydration and bicarb no angina enzymes Negative I/O's positive a liter metformin and oral agents held.   Chol:  On statin   DM:  Discussed low carb diet.  Target hemoglobin A1c is 6.5 or less.  Continue current medications.   Signed, Charlton Haws, MD  07/06/2016, 7:42 AM

## 2016-07-06 NOTE — Progress Notes (Signed)
RN spoke with Cardiology regarding multiple orders for IVF. Order received to D/C all fluids and will address pre-cath fluids in the morning.  IV Nitro and Heparin to be continued.

## 2016-07-06 NOTE — Progress Notes (Signed)
ANTICOAGULATION CONSULT NOTE - Follow-Up  Pharmacy Consult for Heparin Indication: chest pain/ACS  No Known Allergies  Patient Measurements: Height:  (175.3 cm) Weight: 172 lb 6.4 oz (78.2 kg) IBW/kg (Calculated) : 70.7 Heparin Dosing Weight: 75kg  Vital Signs: Temp: 99.8 F (37.7 C) (04/13 1447) Temp Source: Oral (04/13 1447) BP: 113/74 (04/13 1353)  Labs:  Recent Labs  07/04/16 2220  07/05/16 9629 07/05/16 0926 07/05/16 2213 07/06/16 0534 07/06/16 0806 07/06/16 1259 07/06/16 1633  HGB 14.3  --   --   --   --  12.2*  --   --   --   HCT 42.3  --   --   --   --  36.3*  --   --   --   PLT 242  --   --   --   --  221  --   --   --   LABPROT  --   --   --  14.7  --   --   --   --   --   INR  --   --   --  1.14  --   --   --   --   --   HEPARINUNFRC  --   --   --   --   --  <0.10*  --   --  <0.10*  CREATININE 1.32*  --   --   --   --  2.16* 2.09* 2.03*  --   TROPONINI  --   < > <0.03 <0.03 <0.03  --   --   --   --   < > = values in this interval not displayed.  Estimated Creatinine Clearance: 46.4 mL/min (A) (by C-G formula based on SCr of 2.03 mg/dL (H)).   Assessment: 86 YOM with hx CAD who presented on 4/12 with CP. Pharmacy was consulted to dose heparin for anticoagulation. Plan was for cath 4/13 however this was postponed due to the patient's AKI.  Heparin level this evening remains SUBtherapeutic despite a rate increase earlier today (HL<0.1, goal of 0.3-0.7). Hgb 12.2, plts wnl - no overt s/sx of bleeding noted. RN reports heparin drip has been   Goal of Therapy:  Heparin level 0.3-0.7 units/ml Monitor platelets by anticoagulation protocol: Yes   Plan:  1. Increase Heparin to 1550 units/hr (15.5 ml/hr) 2. Will continue to monitor for any signs/symptoms of bleeding and will follow up with heparin level in 6 hours   Thank you for allowing pharmacy to be a part of this patient's care.  Georgina Pillion, PharmD, BCPS Clinical Pharmacist Pager:  [phone] 07/06/2016 6:56 PM

## 2016-07-06 NOTE — Progress Notes (Signed)
[facility] PROGRESS NOTE  [name] ZOX:[id number] DOB: 02-19-1971 DOA: 07/04/2016   PCP: Samuel Jester, DO     Assessment/Plan: Principal Problem:   CORONARY ATHEROSCLEROSIS NATIVE CORONARY ARTERY Active Problems:   Type 2 diabetes mellitus, uncontrolled (HCC)   Essential hypertension   Chest pain   46 y.o.malewith CAD (anterior MI s/p thrombectomy/BMS to mLAD 01/2009, ICM, DM, HTN, HLD, probable CKD II (Cr baseline 1.2-1.4), medication noncompliance, remote alcohol abuse admitted for chest pain  Assessment and plan Chest pain -but cardiac enzymes negative - given risk factors continue telemetry, appreciate cardiology recommendations. Cardiac cath showed Diffuse in-stent restenosis, 99%, of the stent placed in the mid LAD Distal anterior wall and apical akinesis. LVEF 40%.   . 2-D echo to rule out wall motion abnormalities pending. Continue Aspirin 81 mg /heparin drip. Patient scheduled to have LAD stent today. Hold off on beta blocker as heart rate is in the 60s.. Further treatment based on cardiology recommendations    . CORONARY ATHEROSCLEROSIS NATIVE CORONARY ARTERY - Continue aspirin and statin  . Essential hypertension -  blood pressure stable monitor  . Type 2 diabetes mellitus, uncontrolled (HCC) - hold PO meds order sliding scale. Hemoglobin A1c 8.9  Acute on chronic CK D stage II-baseline creatinine around 1.4, creatinine greater than 2 post cath, continue IV fluids     DVT prophylaxsis heparin drip  Code Status:  Full code    Family Communication: Discussed in detail with the patient, all imaging results, lab results explained to the patient   Disposition Plan:  Anticipate discharge tomorrow      Consultants:  Cardiology  Procedures:  Cardiac cath  Antibiotics: Anti-infectives    None         HPI/Subjective: Patient currently chest pain-free  Objective: Vitals:   07/06/16 0251 07/06/16 0350 07/06/16 0412 07/06/16  0650  BP: (!) 87/51 (!) 104/56  105/71  Pulse: 70  77 85  Resp:    (!) 27  Temp:   98.7 F (37.1 C)   TempSrc:   Oral   SpO2: 90%  92% 93%  Weight:   78.2 kg (172 lb 6.4 oz)   Height:        Intake/Output Summary (Last 24 hours) at 07/06/16 1228 Last data filed at 07/06/16 0300  Gross per 24 hour  Intake          1268.56 ml  Output                0 ml  Net          1268.56 ml    Exam:  Examination:  General exam: Appears calm and comfortable  Respiratory system: Clear to auscultation. Respiratory effort normal. Cardiovascular system: S1 & S2 heard, RRR. No JVD, murmurs, rubs, gallops or clicks. No pedal edema. Gastrointestinal system: Abdomen is nondistended, soft and nontender. No organomegaly or masses felt. Normal bowel sounds heard. Central nervous system: Alert and oriented. No focal neurological deficits. Extremities: Symmetric 5 x 5 power. Skin: No rashes, lesions or ulcers Psychiatry: Judgement and insight appear normal. Mood & affect appropriate.     Data Reviewed: I have personally reviewed following labs and imaging studies  Micro Results No results found for this or any previous visit (from the past 240 hour(s)).  Radiology Reports Dg Chest 2 View  Result Date: 07/04/2016 CLINICAL DATA:  Chest pain with dyspnea radiating across a chest EXAM: CHEST  2 VIEW COMPARISON:  11/07/2014 FINDINGS: The  heart size and mediastinal contours are within normal limits. Both lungs are clear. The visualized skeletal structures are unremarkable. IMPRESSION: No active cardiopulmonary disease. Electronically Signed   By: Tollie Eth M.D.   On: 07/04/2016 23:05     CBC  Recent Labs Lab 07/04/16 2220 07/06/16 0534  WBC 8.5 13.8*  HGB 14.3 12.2*  HCT 42.3 36.3*  PLT 242 221  MCV 80.1 81.0  MCH 27.1 27.2  MCHC 33.8 33.6  RDW 13.1 13.7    Chemistries   Recent Labs Lab 07/04/16 2220 07/06/16 0534 07/06/16 0806  NA 136 137 137  K 4.1 4.6 4.5  CL 104 103 105   CO2 25 24 21*  GLUCOSE 174* 222* 209*  BUN CREATININE 1.32* 2.16* 2.09*  CALCIUM 8.7* 8.4* 8.3*  AST  --  30  --   ALT  --  20  --   ALKPHOS  --  46  --   BILITOT  --  0.8  --    ------------------------------------------------------------------------------------------------------------------ estimated creatinine clearance is 45.1 mL/min (A) (by C-G formula based on SCr of 2.09 mg/dL (H)). ------------------------------------------------------------------------------------------------------------------  Recent Labs  07/05/16 0313  HGBA1C 8.9*   ------------------------------------------------------------------------------------------------------------------  Recent Labs  07/06/16 0534  CHOL 115  HDL 54  LDLCALC 42  TRIG 94  CHOLHDL 2.1   ------------------------------------------------------------------------------------------------------------------ No results for input(s): TSH, T4TOTAL, T3FREE, THYROIDAB in the last 72 hours.  Invalid input(s): FREET3 ------------------------------------------------------------------------------------------------------------------ No results for input(s): VITAMINB12, FOLATE, FERRITIN, TIBC, IRON, RETICCTPCT in the last 72 hours.  Coagulation profile  Recent Labs Lab 07/05/16 0926  INR 1.14    No results for input(s): DDIMER in the last 72 hours.  Cardiac Enzymes  Recent Labs Lab 07/05/16 0608 07/05/16 0926 07/05/16 2213  TROPONINI <0.03 <0.03 <0.03   ------------------------------------------------------------------------------------------------------------------ Invalid input(s): POCBNP   CBG:  Recent Labs Lab 07/05/16 2025 07/06/16 0017 07/06/16 0412 07/06/16 0721 07/06/16 1118  GLUCAP 268* 180* 208* 164* 199*       Studies: Dg Chest 2 View  Result Date: 07/04/2016 CLINICAL DATA:  Chest pain with dyspnea radiating across a chest EXAM: CHEST  2 VIEW COMPARISON:  11/07/2014 FINDINGS: The heart  size and mediastinal contours are within normal limits. Both lungs are clear. The visualized skeletal structures are unremarkable. IMPRESSION: No active cardiopulmonary disease. Electronically Signed   By: Tollie Eth M.D.   On: 07/04/2016 23:05      Lab Results  Component Value Date   HGBA1C 8.9 (H) 07/05/2016   HGBA1C (H) 05/04/2010    9.3 (NOTE)                                                                       According to the ADA Clinical Practice Recommendations for 2011, when HbA1c is used as a screening test:   >=6.5%   Diagnostic of Diabetes Mellitus           (if abnormal result  is confirmed)  5.7-6.4%   Increased risk of developing Diabetes Mellitus  References:Diagnosis and Classification of Diabetes Mellitus,Diabetes Care,2011,34(Suppl 1):S62-S69 and Standards of Medical Care in         Diabetes - 2011,Diabetes Care,2011,34  (Suppl 1):S11-S61.   HGBA1C (H) 02/01/2009  9.0 (NOTE) The ADA recommends the following therapeutic goal for glycemic control related to Hgb A1c measurement: Goal of therapy: <6.5 Hgb A1c  Reference: American Diabetes Association: Clinical Practice Recommendations 2010, Diabetes Care, 2010, 33: (Suppl  1).   Lab Results  Component Value Date   LDLCALC 42 07/06/2016   CREATININE 2.09 (H) 07/06/2016       Scheduled Meds: . [START ON 07/07/2016] aspirin  81 mg Oral Pre-Cath  . [START ON 07/07/2016] aspirin EC  81 mg Oral Daily  . atorvastatin  80 mg Oral Daily  . citalopram  20 mg Oral Daily  . insulin aspart  0-9 Units Subcutaneous Q4H  . sodium chloride flush  3 mL Intravenous Q12H  . sodium chloride flush  3 mL Intravenous Q12H  . sodium chloride flush  3 mL Intravenous Q12H   Continuous Infusions: . sodium chloride 100 mL/hr at 07/06/16 0940  . sodium chloride 1 mL/kg/hr (07/05/16 2104)  . sodium chloride    . heparin 1,250 Units/hr (07/06/16 0656)  . nitroGLYCERIN 80 mcg/min (07/05/16 2159)     LOS: 1 day    Time spent: >30 MINS     Richarda Overlie  [facility] Pager [phone]. If 7PM-7AM, please contact night-coverage at [email or web address], password [facility] 07/06/2016, 12:28 PM  LOS: 1 day

## 2016-07-06 NOTE — Progress Notes (Signed)
Upon assessment, patient noted to be tachypneic, tachycardiac, febrile and noted that patient's output for the day was 150cc. Provider paged, orders received to restart fluids, CXR, Blood Cultures, ABX and Tylenol.   Due to septic criteria, Lactic Acid drawn. Resulted 2.2, RN spoke with provider. No additional orders received. Will continue fluids at 75 cc/hr. Repeat Lactic Acid to be redrawn per orders.

## 2016-07-06 NOTE — Progress Notes (Addendum)
Inpatient Diabetes Program Recommendations  AACE/ADA: New Consensus Statement on Inpatient Glycemic Control (2015)  Target Ranges:  Prepandial:   less than 140 mg/dL      Peak postprandial:   less than 180 mg/dL (1-2 hours)      Critically ill patients:  140 - 180 mg/dL   Results for JHETT, FRETWELL (MRN [id number]) as of 07/06/2016 11:21  Ref. Range 07/05/2016 07:42 07/05/2016 11:45 07/05/2016 19:05 07/05/2016 20:25 07/06/2016 00:17 07/06/2016 04:12 07/06/2016 07:21 07/06/2016 11:18  Glucose-Capillary Latest Ref Range: 65 - 99 mg/dL 409 (H) 811 (H) 914 (H) 268 (H) 180 (H) 208 (H) 164 (H) 199 (H)   Review of Glycemic Control  Diabetes history: DM2 Outpatient Diabetes medications: Metformin 1000 mg BID, Glipizide 10 QAM, Actos 30 mg daily, Januvia 50 mg daily Current orders for Inpatient glycemic control: Novolog 0-9 units TID with meals, Novolog 0-5 units QHS  Inpatient Diabetes Program Recommendations: Insulin - Basal: If glucose is consistently greater than 180 mg/dl, may want to consider ordering low dose basal insulin (such as Lantus 8 units Q24H; based on 78 kg x 0.1 units). HgbA1C: A1C 8.9% on 07/05/16 indicating an average glucose of 209 mg/dl over the past 2-3 months. Recommend patient follow up with PCP and may want to referral to Endocrinologist for assistance with DM control.  Addendum 07/06/16@13 :55-Spoke with patient, spouse, and his mother (with patient permission) about diabetes and home regimen for diabetes control. Patient reports that he is followed by PCP for diabetes management and currently he takes Metformin 1000 mg BID, Actos 30 mg daily, and Januvia 50 mg daily  as an outpatient for diabetes control. Inquired about Glipizide and patient states that he stopped taking Glipizide about 2 weeks ago because "my sugar was going too low and my Mama told me to stop taking it." Patient's mother present and states that she told him to stop taking Glipizide until he talked to his doctor.   Patient has not followed up with PCP since stopping Glipizide.  Patient states that he checks his glucose 2-3 times per day and that it is usually high (up to 500 mg/dl) in the mornings and in the 200-300's mg/dl during the day. Patient's wife states that she has asked patient's PCP about starting insulin to improve DM control but PCP has been hesitant to do so.   Inquired about prior A1C and patient reports that he does not recall his last A1C value. Discussed A1C results (8.9% on 07/05/16) and explained that his current A1C indicates an average glucose of 209 mg/dl over the past 2-3 months. Discussed glucose and A1C goals. Discussed importance of checking CBGs and maintaining good CBG control to prevent long-term and short-term complications. Explained how hyperglycemia leads to damage within blood vessels which lead to the common complications seen with uncontrolled diabetes. Stressed to the patient the importance of improving glycemic control to prevent further complications from uncontrolled diabetes. Discussed impact of nutrition, exercise, stress, sickness, and medications on diabetes control. Encouraged patient to check his glucose 3-4 times per day (before meals and at bedtime) and to keep a log book of glucose readings and DM medications taken which he will need to take to doctor appointments. Explained how the doctor he follows up with can use the log book to continue to make adjustments with DM medications if needed. Recommended patient follow up with PCP soon and to inquire about possibility of insulin and referral to Endocrinologist. Patient verbalized understanding of information discussed and he states that he  has no further questions at this time related to diabetes.  Thanks, Orlando Penner, RN, MSN, CDE Diabetes Coordinator Inpatient Diabetes Program [phone] (Team Pager from 8am to 5pm)

## 2016-07-07 ENCOUNTER — Other Ambulatory Visit (HOSPITAL_COMMUNITY): Payer: Self-pay

## 2016-07-07 ENCOUNTER — Inpatient Hospital Stay (HOSPITAL_COMMUNITY): Payer: Medicaid Other

## 2016-07-07 DIAGNOSIS — I2510 Atherosclerotic heart disease of native coronary artery without angina pectoris: Secondary | ICD-10-CM

## 2016-07-07 DIAGNOSIS — N183 Chronic kidney disease, stage 3 unspecified: Secondary | ICD-10-CM

## 2016-07-07 DIAGNOSIS — N179 Acute kidney failure, unspecified: Secondary | ICD-10-CM

## 2016-07-07 LAB — COMPREHENSIVE METABOLIC PANEL
ALBUMIN: 2.8 g/dL — AB (ref 3.5–5.0)
ALT: 15 U/L — ABNORMAL LOW (ref 17–63)
ANION GAP: 9 (ref 5–15)
AST: 18 U/L (ref 15–41)
Alkaline Phosphatase: 40 U/L (ref 38–126)
BUN: 16 mg/dL (ref 6–20)
CALCIUM: 8.1 mg/dL — AB (ref 8.9–10.3)
CHLORIDE: 100 mmol/L — AB (ref 101–111)
CO2: 26 mmol/L (ref 22–32)
Creatinine, Ser: 1.82 mg/dL — ABNORMAL HIGH (ref 0.61–1.24)
GFR calc non Af Amer: 44 mL/min — ABNORMAL LOW (ref >60)
GFR, EST AFRICAN AMERICAN: 50 mL/min — AB (ref >60)
Glucose, Bld: 123 mg/dL — ABNORMAL HIGH (ref 65–99)
POTASSIUM: 3.6 mmol/L (ref 3.5–5.1)
SODIUM: 135 mmol/L (ref 135–145)
Total Bilirubin: 0.8 mg/dL (ref 0.3–1.2)
Total Protein: 5.1 g/dL — ABNORMAL LOW (ref 6.5–8.1)

## 2016-07-07 LAB — GLUCOSE, CAPILLARY
GLUCOSE-CAPILLARY: 128 mg/dL — AB (ref 65–99)
GLUCOSE-CAPILLARY: 112 mg/dL — AB (ref 65–99)
GLUCOSE-CAPILLARY: 187 mg/dL — AB (ref 65–99)
Glucose-Capillary: 114 mg/dL — ABNORMAL HIGH (ref 65–99)
Glucose-Capillary: 165 mg/dL — ABNORMAL HIGH (ref 65–99)
Glucose-Capillary: 216 mg/dL — ABNORMAL HIGH (ref 65–99)

## 2016-07-07 LAB — ECHOCARDIOGRAM COMPLETE
CHL CUP STROKE VOLUME: 50 mL
EERAT: 10.91
EWDT: 229 ms
FS: 35 % (ref 28–44)
HEIGHTINCHES: 69 in
IV/PV OW: 1.01
LA diam index: 2.16 cm/m2
LA vol index: 24.3 mL/m2
LASIZE: 42 mm
LAVOL: 47.2 mL
LAVOLA4C: 46.2 mL
LEFT ATRIUM END SYS DIAM: 42 mm
LV PW d: 8.54 mm — AB (ref 0.6–1.1)
LV SIMPSON'S DISK: 44
LV TDI E'MEDIAL: 7.21
LV dias vol index: 58 mL/m2
LV dias vol: 113 mL (ref 62–150)
LVEEAVG: 10.91
LVEEMED: 10.91
LVELAT: 9.62 cm/s
LVOT SV: 68 mL
LVOT VTI: 19.7 cm
LVOT area: 3.46 cm2
LVOT diameter: 21 mm
LVOT peak vel: 103 cm/s
LVSYSVOL: 64 mL — AB (ref 21–61)
LVSYSVOLIN: 33 mL/m2
Lateral S' vel: 14.1 cm/s
MV Dec: 229
MV pk A vel: 66.2 m/s
MVPG: 4 mmHg
MVPKEVEL: 105 m/s
RV TAPSE: 22.2 mm
TDI e' lateral: 9.62
Weight: 2760 oz

## 2016-07-07 LAB — CBC
HCT: 32.4 % — ABNORMAL LOW (ref 39.0–52.0)
Hemoglobin: 10.9 g/dL — ABNORMAL LOW (ref 13.0–17.0)
MCH: 27 pg (ref 26.0–34.0)
MCHC: 33.6 g/dL (ref 30.0–36.0)
MCV: 80.2 fL (ref 78.0–100.0)
PLATELETS: 175 10*3/uL (ref 150–400)
RBC: 4.04 MIL/uL — ABNORMAL LOW (ref 4.22–5.81)
RDW: 13.3 % (ref 11.5–15.5)
WBC: 12 10*3/uL — ABNORMAL HIGH (ref 4.0–10.5)

## 2016-07-07 LAB — HEPARIN LEVEL (UNFRACTIONATED)
Heparin Unfractionated: 0.31 IU/mL (ref 0.30–0.70)
Heparin Unfractionated: 0.26 IU/mL — ABNORMAL LOW (ref 0.30–0.70)
Heparin Unfractionated: 0.38 IU/mL (ref 0.30–0.70)

## 2016-07-07 LAB — LACTIC ACID, PLASMA
Lactic Acid, Venous: 1.6 mmol/L (ref 0.5–1.9)
Lactic Acid, Venous: 2.5 mmol/L (ref 0.5–1.9)

## 2016-07-07 MED ORDER — SODIUM CHLORIDE 0.9 % IV BOLUS (SEPSIS)
500.0000 mL | Freq: Once | INTRAVENOUS | Status: AC
  Administered 2016-07-07: 500 mL via INTRAVENOUS

## 2016-07-07 MED ORDER — PIPERACILLIN-TAZOBACTAM 3.375 G IVPB
3.3750 g | Freq: Three times a day (TID) | INTRAVENOUS | Status: DC
  Filled 2016-07-07: qty 50

## 2016-07-07 MED ORDER — HEPARIN BOLUS VIA INFUSION
1000.0000 [IU] | Freq: Once | INTRAVENOUS | Status: AC
  Administered 2016-07-07: 1000 [IU] via INTRAVENOUS
  Filled 2016-07-07: qty 1000

## 2016-07-07 MED ORDER — PIPERACILLIN-TAZOBACTAM 3.375 G IVPB
3.3750 g | Freq: Three times a day (TID) | INTRAVENOUS | Status: DC
  Administered 2016-07-07 – 2016-07-11 (×13): 3.375 g via INTRAVENOUS
  Filled 2016-07-07 (×16): qty 50

## 2016-07-07 NOTE — Plan of Care (Signed)
Problem: Fluid Volume: Goal: Ability to maintain a balanced intake and output will improve Outcome: Progressing IVF bolus given today. Monitor strict Intake and Output.

## 2016-07-07 NOTE — Progress Notes (Signed)
ANTICOAGULATION CONSULT NOTE - Follow Up Consult  Pharmacy Consult for Heparin  Indication: chest pain/ACS  No Known Allergies  Patient Measurements: Height:  (175.3 cm) Weight: 172 lb 6.4 oz (78.2 kg) IBW/kg (Calculated) : 70.7  Vital Signs: Temp: 100.8 F (38.2 C) (04/13 2300) Temp Source: Oral (04/13 2300) BP: 113/74 (04/13 2300) Pulse Rate: 100 (04/13 2030)  Labs:  Recent Labs  07/04/16 2220  07/05/16 4696 07/05/16 0926 07/05/16 2213 07/06/16 0534 07/06/16 0806 07/06/16 1259 07/06/16 1633 07/07/16 0245  HGB 14.3  --   --   --   --  12.2*  --   --   --  10.9*  HCT 42.3  --   --   --   --  36.3*  --   --   --  32.4*  PLT 242  --   --   --   --  221  --   --   --  175  LABPROT  --   --   --  14.7  --   --   --   --   --   --   INR  --   --   --  1.14  --   --   --   --   --   --   HEPARINUNFRC  --   --   --   --   --  <0.10*  --   --  <0.10* 0.31  CREATININE 1.32*  --   --   --   --  2.16* 2.09* 2.03*  --   --   TROPONINI  --   < > <0.03 <0.03 <0.03  --   --   --   --   --   < > = values in this interval not displayed.  Estimated Creatinine Clearance: 46.4 mL/min (A) (by C-G formula based on SCr of 2.03 mg/dL (H)).   Assessment: 46 y/o M on heparin awaiting cath, heparin level just above therapeutic range this AM  Goal of Therapy:  Heparin level 0.3-0.7 units/ml Monitor platelets by anticoagulation protocol: Yes   Plan:  -Inc heparin slightly to 1600 units/hr -1200 HL -F/U cath plans   Abran Duke 07/07/2016,3:41 AM

## 2016-07-07 NOTE — Progress Notes (Signed)
Progress Note  Patient Name: [name] Date of Encounter: 07/07/2016  Primary Cardiologist: Hochrein  Subjective   Asymptomatic. Creat improved to 1.9.  Inpatient Medications    Scheduled Meds: . aspirin  81 mg Oral Pre-Cath  . aspirin EC  81 mg Oral Daily  . atorvastatin  80 mg Oral Daily  . citalopram  20 mg Oral Daily  . insulin aspart  0-9 Units Subcutaneous Q4H  . piperacillin-tazobactam (ZOSYN)  IV  3.375 g Intravenous Q8H  . sodium chloride flush  3 mL Intravenous Q12H  . sodium chloride flush  3 mL Intravenous Q12H  . sodium chloride flush  3 mL Intravenous Q12H   Continuous Infusions: . heparin 1,600 Units/hr (07/07/16 0414)  . nitroGLYCERIN 80 mcg/min (07/07/16 0419)   PRN Meds: sodium chloride, sodium chloride, sodium chloride, acetaminophen, morphine injection, nitroGLYCERIN, ondansetron (ZOFRAN) IV, ondansetron (ZOFRAN) IV, sodium chloride flush, sodium chloride flush, sodium chloride flush   Vital Signs    Vitals:   07/06/16 2300 07/07/16 0030 07/07/16 0415 07/07/16 0800  BP: 113/74  112/75 100/60  Pulse:      Resp:      Temp: (!) 100.8 F (38.2 C) 98.8 F (37.1 C) 98.5 F (36.9 C) 99.1 F (37.3 C)  TempSrc: Oral Oral Oral Oral  SpO2: 90%  94% 95%  Weight:   78.2 kg (172 lb 8 oz)   Height:        Intake/Output Summary (Last 24 hours) at 07/07/16 1122 Last data filed at 07/07/16 1100  Gross per 24 hour  Intake          2962.53 ml  Output             1500 ml  Net          1462.53 ml   Filed Weights   07/05/16 0216 07/06/16 0412 07/07/16 0415  Weight: 75.8 kg (167 lb 3.2 oz) 78.2 kg (172 lb 6.4 oz) 78.2 kg (172 lb 8 oz)    Telemetry    NSR - Personally Reviewed  ECG    NSR, Q V2-V5 and residual ST elevation in same leads - Personally Reviewed  Physical Exam  Comfortable GEN: No acute distress.   Neck: No JVD Cardiac: RRR, no murmurs, rubs, or gallops.  Respiratory: Clear to auscultation bilaterally. GI: Soft, nontender,  non-distended  MS: No edema; No deformity. Neuro:  Nonfocal  Psych: Normal affect   Labs    Chemistry Recent Labs Lab 07/06/16 0534 07/06/16 0806 07/06/16 1259 07/07/16 0245  NA 137 137 139 135  K 4.6 4.5 4.2 3.6  CL 103 105 100* 100*  CO2 24 21* 23 26  GLUCOSE 222* 209* 173* 123*  BUN CREATININE 2.16* 2.09* 2.03* 1.82*  CALCIUM 8.4* 8.3* 8.7* 8.1*  PROT 5.7*  --   --  5.1*  ALBUMIN 3.3*  --   --  2.8*  AST 30  --   --  18  ALT 20  --   --  15*  ALKPHOS 46  --   --  40  BILITOT 0.8  --   --  0.8  GFRNONAA 35* 37* 38* 44*  GFRAA 41* 43* 44* 50*  ANIONGAP 10 11 16* 9     Hematology Recent Labs Lab 07/04/16 2220 07/06/16 0534 07/07/16 0245  WBC 8.5 13.8* 12.0*  RBC 5.28 4.48 4.04*  HGB 14.3 12.2* 10.9*  HCT 42.3 36.3* 32.4*  MCV 80.1 81.0 80.2  MCH  27.1 27.2 27.0  MCHC 33.8 33.6 33.6  RDW 13.1 13.7 13.3  PLT 242 221 175    Cardiac Enzymes Recent Labs Lab 07/05/16 0313 07/05/16 0608 07/05/16 0926 07/05/16 2213  TROPONINI <0.03 <0.03 <0.03 <0.03    Recent Labs Lab 07/04/16 2249  TROPIPOC 0.00     Radiology    Dg Chest Port 1 View  Result Date: 07/06/2016 CLINICAL DATA:  Fever EXAM: PORTABLE CHEST 1 VIEW COMPARISON:  07/04/2016 chest radiograph. FINDINGS: Low lung volumes. Stable cardiomediastinal silhouette with top-normal heart size. No pneumothorax. No pleural effusion. Patchy bibasilar lung opacities appear new. IMPRESSION: Low lung volumes with new patchy bibasilar lung opacities, which could represent atelectasis, aspiration and/ or pneumonia. Follow-up PA and lateral chest radiographs are warranted. Electronically Signed   By: Delbert Phenix M.D.   On: 07/06/2016 21:58    Cardiac Studies  CATH 07/05/16  Mid RCA lesion, 60 %stenosed.  Mid LAD to Dist LAD lesion, 99 %stenosed.  The left ventricular ejection fraction is 35-45% by visual estimate.    Diffuse in-stent restenosis, 99%, of the stent placed in the mid LAD beyond the  second diagonal and first septal perforator. The has features that suggest total occlusion.   50-60% mid RCA stenosis.  Small, widely patent circumflex system.  Distal anterior wall and apical akinesis. LVEF 40%  RECOMMENDATIONS:   Unable to safely attempt PCI on the LAD due to difficulty with guide catheter engagement and stability.  Consider PCI from the femoral approach in a.m. versus revascularization via the CTO team. Will review images. Tentatively scheduled for right femoral PCI tomorrow. I have not yet given dual antiplatelet therapy.   Patient Profile     46 y.o. male with high grade LAD in-stent restenosis and moderate reduction in LVEF for PCI via femoral approach on Monday, after IV hydration this weekend for acute renal insufficiency  Assessment & Plan    1. Unstable angina: severe in stent restenosis LAD for PCI after improvement in renal function. Needs femoral approach. On statin. DAPT not yet started due to concern for possible need for surgery/failure of PCI. 2. Acute on chronic renal insuff (CKD2): creat 1.3 on admission, peaked 2.16 after cath, now 1.82. 3. DM and HLP: A1c 8.9%, diet/meds discussed. LDL 42 on statin. 4. LV dysfunction: no overt CHF. Watch for dyspnea with fluids over the weekend. Start ARB in long run, timing depends on renal function recovery  Signed, Thurmon Fair, MD  07/07/2016, 11:22 AM

## 2016-07-07 NOTE — Progress Notes (Signed)
  Echocardiogram 2D Echocardiogram has been performed.  Delcie Roch 07/07/2016, 4:16 PM

## 2016-07-07 NOTE — Progress Notes (Signed)
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 Patient underwent cardiac cath on 4/12. Found to have  in-stent restenosis, creatinine increase post cath. Subsequently patient spiked a fever on 4/14. Multiple ongoing issues, not stable for discharge  Assessment and plan Chest pain -but cardiac enzymes negative - given risk factors continue telemetry, appreciate cardiology recommendations. Cardiac cath showed Diffuse in-stent restenosis, 99%, of the stent placed in the mid LAD Distal anterior wall and apical akinesis. LVEF 40%.   . 2-D echo to rule out wall motion abnormalities pending. Continue Aspirin 81 mg /heparin drip. Patient scheduled to have LAD stent , which was postponed until Monday secondary to acute kidney injury. Hold off on beta blocker as heart rate is in the 60s.. Further treatment based on cardiology recommendations  Fever likely secondary to pneumonia-post cath Started on  Zosyn, follow blood cultures Check UA, chest x-ray suspicious for possible aspiration Vancomycin not initiated secondary to acute kidney injury   . CORONARY ATHEROSCLEROSIS NATIVE CORONARY ARTERY - Continue aspirin and statin  . Essential hypertension -  blood pressure stable monitor  . Type 2 diabetes mellitus, uncontrolled (HCC) - hold PO meds order sliding scale. Hemoglobin A1c 8.9  Acute on chronic CK D stage II-baseline creatinine around 1.4, creatinine greater than 2 post cath, now 1.82, continue to follow renal function,  continue IV fluids     DVT prophylaxsis heparin drip  Code Status:  Full code    Family Communication: Discussed in detail with the patient, all imaging results, lab results explained to the patient   Disposition Plan:  Anticipate discharge next week, multiple reasons to delay repeat cardiac cath      Consultants:  Cardiology  Procedures:  Cardiac cath  Antibiotics: Anti-infectives    Start     Dose/Rate Route Frequency Ordered Stop   07/06/16 2200  cefTRIAXone (ROCEPHIN) 1 g in dextrose 5 % 50 mL IVPB     1 g 100 mL/hr over 30 Minutes Intravenous Every 24 hours 07/06/16 2103           HPI/Subjective: Patient has been febrile last night, has been coughing  Objective: Vitals:   07/06/16 2300 07/07/16 0030 07/07/16 0415 07/07/16 0800  BP: 113/74  112/75 100/60  Pulse:      Resp:      Temp: (!) 100.8 F (38.2 C) 98.8 F (37.1 C) 98.5 F (36.9 C) 99.1 F (37.3 C)  TempSrc: Oral Oral Oral Oral  SpO2: 90%  94% 95%  Weight:   78.2 kg (172 lb 8 oz)   Height:        Intake/Output Summary (Last 24 hours) at 07/07/16 0909 Last data filed at 07/07/16 0600  Gross per 24 hour  Intake          2962.53 ml  Output              950 ml  Net          2012.53 ml    Exam:  Examination:  General exam: Appears calm and comfortable  Respiratory system: Clear to auscultation. Respiratory effort normal. Cardiovascular system: S1 & S2 heard, RRR. No JVD, murmurs, rubs, gallops or clicks. No pedal edema. Gastrointestinal system: Abdomen is nondistended, soft and nontender. No organomegaly or masses felt. Normal bowel sounds heard. Central nervous system: Alert and oriented. No focal neurological deficits. Extremities: Symmetric 5 x 5 power. Skin: No rashes, lesions or ulcers Psychiatry: Judgement and insight appear normal. Mood & affect appropriate.     Data Reviewed: I have personally reviewed following labs and imaging studies  Micro Results No results found for  this or any previous visit (from the past 240 hour(s)).  Radiology Reports Dg Chest 2 View  Result Date: 07/04/2016 CLINICAL DATA:  Chest pain with dyspnea radiating across a chest EXAM: CHEST  2 VIEW COMPARISON:  11/07/2014 FINDINGS: The heart size and mediastinal contours are within normal limits. Both lungs are clear. The visualized skeletal structures are unremarkable. IMPRESSION: No active cardiopulmonary disease. Electronically Signed   By: Tollie Eth M.D.   On: 07/04/2016 23:05   Dg Chest Port 1 View  Result Date: 07/06/2016 CLINICAL DATA:  Fever EXAM: PORTABLE CHEST 1 VIEW COMPARISON:  07/04/2016 chest radiograph. FINDINGS: Low lung volumes. Stable cardiomediastinal silhouette with top-normal heart size. No pneumothorax. No pleural effusion. Patchy bibasilar lung opacities appear new. IMPRESSION: Low lung volumes with new patchy bibasilar lung opacities, which could represent atelectasis, aspiration and/ or pneumonia. Follow-up PA and lateral chest radiographs are warranted. Electronically Signed   By: Delbert Phenix M.D.   On: 07/06/2016 21:58     CBC  Recent Labs Lab 07/04/16 2220 07/06/16 0534 07/07/16 0245  WBC 8.5 13.8* 12.0*  HGB 14.3 12.2* 10.9*  HCT 42.3 36.3* 32.4*  PLT 242 221 175  MCV 80.1 81.0 80.2  MCH 27.1 27.2 27.0  MCHC 33.8 33.6 33.6  RDW 13.1 13.7 13.3    Chemistries   Recent Labs Lab 07/04/16 2220 07/06/16 0534 07/06/16 0806 07/06/16 1259 07/07/16 0245  NA 136 137 137 139 135  K 4.1 4.6 4.5 4.2 3.6  CL 104 103 105 100* 100*  CO2 25 24 21* 23 26  GLUCOSE 174* 222* 209* 173* 123*  BUN CREATININE 1.32* 2.16* 2.09* 2.03* 1.82*  CALCIUM 8.7* 8.4* 8.3* 8.7* 8.1*  AST  --  30  --   --  18  ALT  --  20  --   --  15*  ALKPHOS  --  46  --   --  40  BILITOT  --  0.8  --   --  0.8   ------------------------------------------------------------------------------------------------------------------ estimated creatinine clearance is 51.8  mL/min (A) (by C-G formula based on SCr of 1.82 mg/dL (H)). ------------------------------------------------------------------------------------------------------------------  Recent Labs  07/05/16 0313  HGBA1C 8.9*   ------------------------------------------------------------------------------------------------------------------  Recent Labs  07/06/16 0534  CHOL 115  HDL 54  LDLCALC 42  TRIG 94  CHOLHDL 2.1   ------------------------------------------------------------------------------------------------------------------ No results for input(s): TSH, T4TOTAL, T3FREE, THYROIDAB in the last 72 hours.  Invalid input(s): FREET3 ------------------------------------------------------------------------------------------------------------------ No results for input(s): VITAMINB12, FOLATE, FERRITIN, TIBC, IRON, RETICCTPCT in the last 72 hours.  Coagulation profile  Recent Labs Lab 07/05/16 0926  INR 1.14    No results for input(s): DDIMER in the last 72 hours.  Cardiac Enzymes  Recent Labs Lab 07/05/16 0608 07/05/16 0926 07/05/16 2213  TROPONINI <0.03 <0.03 <0.03   ------------------------------------------------------------------------------------------------------------------ Invalid input(s): POCBNP   CBG:  Recent Labs Lab 07/06/16 1118 07/06/16 1632 07/06/16 2024 07/07/16  0009 07/07/16 0412  GLUCAP 199* 253* 222* 165* 128*       Studies: Dg Chest Port 1 View  Result Date: 07/06/2016 CLINICAL DATA:  Fever EXAM: PORTABLE CHEST 1 VIEW COMPARISON:  07/04/2016 chest radiograph. FINDINGS: Low lung volumes. Stable cardiomediastinal silhouette with top-normal heart size. No pneumothorax. No pleural effusion. Patchy bibasilar lung opacities appear new. IMPRESSION: Low lung volumes with new patchy bibasilar lung opacities, which could represent atelectasis, aspiration and/ or pneumonia. Follow-up PA and lateral chest radiographs are warranted. Electronically  Signed   By: Delbert Phenix M.D.   On: 07/06/2016 21:58      Lab Results  Component Value Date   HGBA1C 8.9 (H) 07/05/2016   HGBA1C (H) 05/04/2010    9.3 (NOTE)                                                                       According to the ADA Clinical Practice Recommendations for 2011, when HbA1c is used as a screening test:   >=6.5%   Diagnostic of Diabetes Mellitus           (if abnormal result  is confirmed)  5.7-6.4%   Increased risk of developing Diabetes Mellitus  References:Diagnosis and Classification of Diabetes Mellitus,Diabetes Care,2011,34(Suppl 1):S62-S69 and Standards of Medical Care in         Diabetes - 2011,Diabetes Care,2011,34  (Suppl 1):S11-S61.   HGBA1C (H) 02/01/2009    9.0 (NOTE) The ADA recommends the following therapeutic goal for glycemic control related to Hgb A1c measurement: Goal of therapy: <6.5 Hgb A1c  Reference: American Diabetes Association: Clinical Practice Recommendations 2010, Diabetes Care, 2010, 33: (Suppl  1).   Lab Results  Component Value Date   LDLCALC 42 07/06/2016   CREATININE 1.82 (H) 07/07/2016       Scheduled Meds: . aspirin  81 mg Oral Pre-Cath  . aspirin EC  81 mg Oral Daily  . atorvastatin  80 mg Oral Daily  . cefTRIAXone (ROCEPHIN)  IV  1 g Intravenous Q24H  . citalopram  20 mg Oral Daily  . insulin aspart  0-9 Units Subcutaneous Q4H  . sodium chloride flush  3 mL Intravenous Q12H  . sodium chloride flush  3 mL Intravenous Q12H  . sodium chloride flush  3 mL Intravenous Q12H   Continuous Infusions: . heparin 1,600 Units/hr (07/07/16 0414)  . nitroGLYCERIN 80 mcg/min (07/07/16 0419)     LOS: 2 days    Time spent: >30 MINS    Richarda Overlie  [facility] Pager [phone]. If 7PM-7AM, please contact night-coverage at [email or web address], password [facility] 07/07/2016, 9:09 AM  LOS: 2 days

## 2016-07-07 NOTE — Progress Notes (Addendum)
Corrected note - Critical Result - Lactic Acid = 2.5. Dr. Susie Cassette notified.

## 2016-07-07 NOTE — Progress Notes (Signed)
ANTICOAGULATION CONSULT NOTE - Follow Up Consult  Pharmacy Consult for heparin Indication: chest pain/ACS  No Known Allergies  Patient Measurements: Height:  (175.3 cm) Weight: 172 lb 8 oz (78.2 kg) IBW/kg (Calculated) : 70.7 Heparin Dosing Weight: 78.2 kg  Vital Signs: Temp: 99 F (37.2 C) (04/14 1623) Temp Source: Oral (04/14 1623) BP: 117/72 (04/14 1833) Pulse Rate: 74 (04/14 1622)  Labs:  Recent Labs  07/04/16 2220  07/05/16 0608 07/05/16 0926 07/05/16 2213 07/06/16 0534 07/06/16 0806 07/06/16 1259  07/07/16 0245 07/07/16 1125 07/07/16 1926  HGB 14.3  --   --   --   --  12.2*  --   --   --  10.9*  --   --   HCT 42.3  --   --   --   --  36.3*  --   --   --  32.4*  --   --   PLT 242  --   --   --   --  221  --   --   --  175  --   --   LABPROT  --   --   --  14.7  --   --   --   --   --   --   --   --   INR  --   --   --  1.14  --   --   --   --   --   --   --   --   HEPARINUNFRC  --   --   --   --   --  <0.10*  --   --   < > 0.31 0.26* 0.38  CREATININE 1.32*  --   --   --   --  2.16* 2.09* 2.03*  --  1.82*  --   --   TROPONINI  --   < > <0.03 <0.03 <0.03  --   --   --   --   --   --   --   < > = values in this interval not displayed.  Estimated Creatinine Clearance: 51.8 mL/min (A) (by C-G formula based on SCr of 1.82 mg/dL (H)).  Assessment: 46 yo M admitted 07/04/2016 awaiting PCI on Monday 4/16, pending AKI resolution.  HL 0.38 (therapeutic), Hgb 10.9, Plt 175, no signs/symptoms of bleeding noted  Goal of Therapy:  Heparin level 0.3-0.7 units/ml Monitor platelets by anticoagulation protocol: Yes   Plan:  - Continue heparin 1750 units/hr - Monitor daily heparin level, CBC and signs/symptoms of bleeding - Follow up plans for cath  Casilda Carls, PharmD, BCPS PGY-2 Infectious Diseases Pharmacy Resident Pager: [phone] 07/07/2016,8:36 PM

## 2016-07-07 NOTE — Plan of Care (Signed)
Problem: Pain Managment: Goal: General experience of comfort will improve Outcome: Progressing Patient has remained free from pain this shift.   Problem: Physical Regulation: Goal: Will remain free from infection Outcome: Progressing Sepsis screening initiated this shift. Refer to progress notes. Patient currently afebrile. Received 1st dose of ABX.   Problem: Fluid Volume: Goal: Ability to maintain a balanced intake and output will improve Outcome: Progressing Recent creatinine improved. Patient having minimal output, 0.9 @ 75 started last night. Strict I's and O's.

## 2016-07-07 NOTE — Progress Notes (Signed)
ANTICOAGULATION CONSULT NOTE - Follow Up Consult  Pharmacy Consult for Heparin  Indication: chest pain/ACS  Assessment: 46 y/o M on heparin awaiting PCI on Monday, pending AKI resolution. Heparin level is 0.26 (subtherapeutic) despite rate increases. Hemoglobin and platelets are stable. No overt bleeding noted, no issues with line per RN.   Goal of Therapy:  Heparin level 0.3-0.7 units/ml Monitor platelets by anticoagulation protocol: Yes   Plan:  -Heparin bolus via infusion 1000 units -Increase heparin drip rate to 1750 units/hr -2000 HL -F/U cath plans    No Known Allergies  Patient Measurements: Height:  (175.3 cm) Weight: 172 lb 8 oz (78.2 kg) IBW/kg (Calculated) : 70.7  Vital Signs: Temp: 99.1 F (37.3 C) (04/14 0800) Temp Source: Oral (04/14 0800) BP: 100/60 (04/14 0800)  Labs:  Recent Labs  07/04/16 2220  07/05/16 0608 07/05/16 0926 07/05/16 2213  07/06/16 0534 07/06/16 0806 07/06/16 1259 07/06/16 1633 07/07/16 0245 07/07/16 1125  HGB 14.3  --   --   --   --   --  12.2*  --   --   --  10.9*  --   HCT 42.3  --   --   --   --   --  36.3*  --   --   --  32.4*  --   PLT 242  --   --   --   --   --  221  --   --   --  175  --   LABPROT  --   --   --  14.7  --   --   --   --   --   --   --   --   INR  --   --   --  1.14  --   --   --   --   --   --   --   --   HEPARINUNFRC  --   --   --   --   --   < > <0.10*  --   --  <0.10* 0.31 0.26*  CREATININE 1.32*  --   --   --   --   --  2.16* 2.09* 2.03*  --  1.82*  --   TROPONINI  --   < > <0.03 <0.03 <0.03  --   --   --   --   --   --   --   < > = values in this interval not displayed.  Estimated Creatinine Clearance: 51.8 mL/min (A) (by C-G formula based on SCr of 1.82 mg/dL (H)).  Allie Bossier, PharmD PGY1 Pharmacy Resident [phone] (Pager) 07/07/2016 12:34 PM

## 2016-07-07 NOTE — Progress Notes (Signed)
Incentive Spirometer given to pt. Instructed to use every hour while awake. Verbalized understanding.

## 2016-07-07 NOTE — Progress Notes (Signed)
Pharmacy Antibiotic Note  [name] is a 46 y.o. male admitted on 07/04/2016 with chest pain, ACS r/o.  Pharmacy has been consulted for Vancomycin and Zosyn dosing for possible pneumonia. Today is antibiotic day #2 - Tmax 102.7, wbc 12. Renal function has worsened likely due to cath procedure on 4/12 - Scr has improved to 1.82 (baseline 1.32 on admit, CrCl ~50 ml/min). Given AKI clinical picture and need to return to cath on Monday, discussed with Dr. Susie Cassette and will continue with just Zosyn at this time.    Plan: Zosyn 3.375 grams IV every 8 hours  Monitor renal function and clinical improvement Follow-up blood cultures and length of therapy   Height:  (175.3 cm) Weight: 172 lb 8 oz (78.2 kg) IBW/kg (Calculated) : 70.7  Temp (24hrs), Avg:100 F (37.8 C), Min:98.5 F (36.9 C), Max:102.7 F (39.3 C)   Recent Labs Lab 07/04/16 2220 07/06/16 0534 07/06/16 0806 07/06/16 1259 07/06/16 2306 07/07/16 0245  WBC 8.5 13.8*  --   --   --  12.0*  CREATININE 1.32* 2.16* 2.09* 2.03*  --  1.82*  LATICACIDVEN  --   --   --   --  2.2* 1.6    Estimated Creatinine Clearance: 51.8 mL/min (A) (by C-G formula based on SCr of 1.82 mg/dL (H)).    No Known Allergies  Antimicrobials this admission: CTX 4/13 >> 4/14 Vancomycin 4/14 >> Zosyn 4/14 >>  Dose adjustments this admission: None  Microbiology results: 4/13 BCx:   Thank you for allowing pharmacy to be a part of this patient's care.  Allie Bossier, PharmD PGY1 Pharmacy Resident [phone] (Pager) 07/07/2016 9:51 AM

## 2016-07-08 DIAGNOSIS — I200 Unstable angina: Secondary | ICD-10-CM

## 2016-07-08 DIAGNOSIS — N178 Other acute kidney failure: Secondary | ICD-10-CM

## 2016-07-08 LAB — GLUCOSE, CAPILLARY
GLUCOSE-CAPILLARY: 144 mg/dL — AB (ref 65–99)
GLUCOSE-CAPILLARY: 134 mg/dL — AB (ref 65–99)
GLUCOSE-CAPILLARY: 124 mg/dL — AB (ref 65–99)
Glucose-Capillary: 183 mg/dL — ABNORMAL HIGH (ref 65–99)
Glucose-Capillary: 206 mg/dL — ABNORMAL HIGH (ref 65–99)
Glucose-Capillary: 261 mg/dL — ABNORMAL HIGH (ref 65–99)

## 2016-07-08 LAB — COMPREHENSIVE METABOLIC PANEL
ALK PHOS: 41 U/L (ref 38–126)
ALT: 16 U/L — AB (ref 17–63)
ANION GAP: 8 (ref 5–15)
AST: 24 U/L (ref 15–41)
Albumin: 2.8 g/dL — ABNORMAL LOW (ref 3.5–5.0)
BILIRUBIN TOTAL: 0.8 mg/dL (ref 0.3–1.2)
BUN: 15 mg/dL (ref 6–20)
CALCIUM: 8.3 mg/dL — AB (ref 8.9–10.3)
CO2: 23 mmol/L (ref 22–32)
CREATININE: 1.77 mg/dL — AB (ref 0.61–1.24)
Chloride: 105 mmol/L (ref 101–111)
GFR, EST AFRICAN AMERICAN: 52 mL/min — AB (ref >60)
GFR, EST NON AFRICAN AMERICAN: 45 mL/min — AB (ref >60)
Glucose, Bld: 132 mg/dL — ABNORMAL HIGH (ref 65–99)
Potassium: 4 mmol/L (ref 3.5–5.1)
Sodium: 136 mmol/L (ref 135–145)
TOTAL PROTEIN: 5.6 g/dL — AB (ref 6.5–8.1)

## 2016-07-08 LAB — CBC
HCT: 32.5 % — ABNORMAL LOW (ref 39.0–52.0)
Hemoglobin: 11.3 g/dL — ABNORMAL LOW (ref 13.0–17.0)
MCH: 27.9 pg (ref 26.0–34.0)
MCHC: 34.8 g/dL (ref 30.0–36.0)
MCV: 80.2 fL (ref 78.0–100.0)
Platelets: 166 10*3/uL (ref 150–400)
RBC: 4.05 MIL/uL — AB (ref 4.22–5.81)
RDW: 13.5 % (ref 11.5–15.5)
WBC: 9.1 10*3/uL (ref 4.0–10.5)

## 2016-07-08 LAB — HEPARIN LEVEL (UNFRACTIONATED): Heparin Unfractionated: 0.31 [IU]/mL (ref 0.30–0.70)

## 2016-07-08 MED ORDER — SODIUM CHLORIDE 0.9 % WEIGHT BASED INFUSION: 1.0000 mL/kg/h | INTRAVENOUS | Status: DC

## 2016-07-08 MED ORDER — ASPIRIN 81 MG PO CHEW
81.0000 mg | CHEWABLE_TABLET | ORAL | Status: AC
  Administered 2016-07-09: 81 mg via ORAL
  Filled 2016-07-08: qty 1

## 2016-07-08 MED ORDER — SODIUM CHLORIDE 0.9% FLUSH: 3.0000 mL | Freq: Two times a day (BID) | INTRAVENOUS | Status: DC

## 2016-07-08 MED ORDER — SODIUM CHLORIDE 0.9 % WEIGHT BASED INFUSION
3.0000 mL/kg/h | INTRAVENOUS | Status: DC
Start: 2016-07-09 — End: 2016-07-09

## 2016-07-08 MED ORDER — SODIUM CHLORIDE 0.9% FLUSH: 3.0000 mL | INTRAVENOUS | Status: DC | PRN

## 2016-07-08 MED ORDER — SODIUM CHLORIDE 0.9 % IV SOLN
250.0000 mL | INTRAVENOUS | Status: DC | PRN
  Administered 2016-07-08: 250 mL via INTRAVENOUS

## 2016-07-08 NOTE — Progress Notes (Signed)
ANTICOAGULATION CONSULT NOTE - Follow Up Consult  Pharmacy Consult for Heparin  Indication: chest pain/ACS  Assessment: 46 y/o M on heparin awaiting PCI on Monday, pending AKI resolution. Heparin level is 0.31 (therapeutic) although at lower end of goal. Hemoglobin and platelets are stable. No overt bleeding noted, no issues with line per RN.   Goal of Therapy:  Heparin level 0.3-0.7 units/ml Monitor platelets by anticoagulation protocol: Yes   Plan:  -Increase heparin drip rate to 1800 units/hr -Monitor daily heparin level and CBC -F/U cath plans    No Known Allergies  Patient Measurements: Height:  (175.3 cm) Weight: 171 lb 1.6 oz (77.6 kg) IBW/kg (Calculated) : 70.7  Vital Signs: Temp: 98.4 F (36.9 C) (04/15 0417) Temp Source: Oral (04/15 0417) BP: 114/71 (04/15 0417) Pulse Rate: 72 (04/15 0417)  Labs:  Recent Labs  07/05/16 0926 07/05/16 2213  07/06/16 0534  07/06/16 1259  07/07/16 0245 07/07/16 1125 07/07/16 1926 07/08/16 0557  HGB  --   --   < > 12.2*  --   --   --  10.9*  --   --  11.3*  HCT  --   --   --  36.3*  --   --   --  32.4*  --   --  32.5*  PLT  --   --   --  221  --   --   --  175  --   --  166  LABPROT 14.7  --   --   --   --   --   --   --   --   --   --   INR 1.14  --   --   --   --   --   --   --   --   --   --   HEPARINUNFRC  --   --   --  <0.10*  --   --   < > 0.31 0.26* 0.38 0.31  CREATININE  --   --   --  2.16*  < > 2.03*  --  1.82*  --   --  1.77*  TROPONINI <0.03 <0.03  --   --   --   --   --   --   --   --   --   < > = values in this interval not displayed.  Estimated Creatinine Clearance: 53.3 mL/min (A) (by C-G formula based on SCr of 1.77 mg/dL (H)).  Allie Bossier, PharmD PGY1 Pharmacy Resident [phone] (Pager) 07/08/2016 9:00 AM

## 2016-07-08 NOTE — Plan of Care (Signed)
Problem: Education: Goal: Understanding of CV disease, CV risk reduction, and recovery process will improve Outcome: Progressing Educational Handout

## 2016-07-08 NOTE — Progress Notes (Signed)
Progress Note  Patient Name: [name] Date of Encounter: 07/08/2016  Primary Cardiologist: Dr. Antoine Poche  Subjective   No chest pain.  No SOB.   Inpatient Medications    Scheduled Meds: . aspirin EC  81 mg Oral Daily  . atorvastatin  80 mg Oral Daily  . citalopram  20 mg Oral Daily  . insulin aspart  0-9 Units Subcutaneous Q4H  . piperacillin-tazobactam (ZOSYN)  IV  3.375 g Intravenous Q8H  . sodium chloride flush  3 mL Intravenous Q12H  . sodium chloride flush  3 mL Intravenous Q12H  . sodium chloride flush  3 mL Intravenous Q12H   Continuous Infusions: . heparin 1,800 Units/hr (07/08/16 0901)  . nitroGLYCERIN 10 mcg/min (07/08/16 0904)   PRN Meds: sodium chloride, sodium chloride, sodium chloride, acetaminophen, morphine injection, nitroGLYCERIN, ondansetron (ZOFRAN) IV, ondansetron (ZOFRAN) IV, sodium chloride flush, sodium chloride flush, sodium chloride flush   Vital Signs    Vitals:   07/07/16 1623 07/07/16 1833 07/07/16 2049 07/08/16 0417  BP:  117/72 115/75 114/71  Pulse:   65 72  Resp:   18 18  Temp: 99 F (37.2 C)  99.5 F (37.5 C) 98.4 F (36.9 C)  TempSrc: Oral  Oral Oral  SpO2: 91%  92% 100%  Weight:    171 lb 1.6 oz (77.6 kg)  Height:        Intake/Output Summary (Last 24 hours) at 07/08/16 1155 Last data filed at 07/08/16 0906  Gross per 24 hour  Intake          1603.81 ml  Output             1501 ml  Net           102.81 ml   Filed Weights   07/06/16 0412 07/07/16 0415 07/08/16 0417  Weight: 172 lb 6.4 oz (78.2 kg) 172 lb 8 oz (78.2 kg) 171 lb 1.6 oz (77.6 kg)    Telemetry    SB - Personally Reviewed  ECG    NA - Personally Reviewed  Physical Exam   GEN: No acute distress.   Neck: No  JVD Cardiac: RRR, no murmurs, rubs, or gallops.  Respiratory: Clear  to auscultation bilaterally. GI: Soft, nontender, non-distended  MS: No  edema; No deformity.  Right wrist access site OK.   Neuro:  Nonfocal  Psych: Normal affect    Labs    Chemistry Recent Labs Lab 07/06/16 0534  07/06/16 1259 07/07/16 0245 07/08/16 0557  NA 137  < > 139 135 136  K 4.6  < > 4.2 3.6 4.0  CL 103  < > 100* 100* 105  CO2 24  < > GLUCOSE 222*  < > 173* 123* 132*  BUN 17  < > CREATININE 2.16*  < > 2.03* 1.82* 1.77*  CALCIUM 8.4*  < > 8.7* 8.1* 8.3*  PROT 5.7*  --   --  5.1* 5.6*  ALBUMIN 3.3*  --   --  2.8* 2.8*  AST 30  --   --  18 24  ALT 20  --   --  15* 16*  ALKPHOS 46  --   --  40 41  BILITOT 0.8  --   --  0.8 0.8  GFRNONAA 35*  < > 38* 44* 45*  GFRAA 41*  < > 44* 50* 52*  ANIONGAP 10  < > 16* 9 8  < > = values in this  interval not displayed.   Hematology Recent Labs Lab 07/06/16 0534 07/07/16 0245 07/08/16 0557  WBC 13.8* 12.0* 9.1  RBC 4.48 4.04* 4.05*  HGB 12.2* 10.9* 11.3*  HCT 36.3* 32.4* 32.5*  MCV 81.0 80.2 80.2  MCH 27.2 27.0 27.9  MCHC 33.6 33.6 34.8  RDW 13.7 13.3 13.5  PLT 221 175 166    Cardiac Enzymes Recent Labs Lab 07/05/16 0313 07/05/16 0608 07/05/16 0926 07/05/16 2213  TROPONINI <0.03 <0.03 <0.03 <0.03    Recent Labs Lab 07/04/16 2249  TROPIPOC 0.00     BNPNo results for input(s): BNP, PROBNP in the last 168 hours.   DDimer No results for input(s): DDIMER in the last 168 hours.   Radiology    Dg Chest Port 1 View  Result Date: 07/06/2016 CLINICAL DATA:  Fever EXAM: PORTABLE CHEST 1 VIEW COMPARISON:  07/04/2016 chest radiograph. FINDINGS: Low lung volumes. Stable cardiomediastinal silhouette with top-normal heart size. No pneumothorax. No pleural effusion. Patchy bibasilar lung opacities appear new. IMPRESSION: Low lung volumes with new patchy bibasilar lung opacities, which could represent atelectasis, aspiration and/ or pneumonia. Follow-up PA and lateral chest radiographs are warranted. Electronically Signed   By: Delbert Phenix M.D.   On: 07/06/2016 21:58    Cardiac Studies   ECHO: - Left ventricle: The cavity size was normal. Wall thickness was    normal. Systolic function was mildly to moderately reduced. The   estimated ejection fraction was in the range of 40% to 45%.   Akinesis of the apicalanteroseptal and apical myocardium.   Features are consistent with a pseudonormal left ventricular   filling pattern, with concomitant abnormal relaxation and   increased filling pressure (grade 2 diastolic dysfunction). - Aortic valve: Moderately calcified annulus. - Mitral valve: There was mild regurgitation.   CATH:   Mid RCA lesion, 60 %stenosed.  Mid LAD to Dist LAD lesion, 99 %stenosed.  The left ventricular ejection fraction is 35-45% by visual estimate.    Diffuse in-stent restenosis, 99%, of the stent placed in the mid LAD beyond the second diagonal and first septal perforator. The has features that suggest total occlusion.   50-60% mid RCA stenosis.  Small, widely patent circumflex system.  Distal anterior wall and apical akinesis. LVEF 40%  RECOMMENDATIONS:   Unable to safely attempt PCI on the LAD due to difficulty with guide catheter engagement and stability.  Consider PCI from the femoral approach in a.m. versus revascularization via the CTO team.   Patient Profile     46 y.o. male with high grade LAD in-stent restenosis and moderate reduction in LVEF for PCI via femoral approach on Monday, after IV hydration this weekend for acute renal insufficiency  Assessment & Plan    CAD:  PCI on schedule for Monday.    Femoral approach.  Continue IV NTG tonight.  Orders written.    ACUTE ON CHRONIC CKD STAGE II:    Creat is now 1.77.    Hydration tonight will be limited because of his renal insufficiency.    ISCHEMIC CARDIOMYOPATHY:  EF as above.  Gentle hydration.  Holding off on ARB/ACE inhibitor pending they dye load.    Signed, Rollene Rotunda, MD  07/08/2016, 11:55 AM

## 2016-07-08 NOTE — Progress Notes (Signed)
[facility] PROGRESS NOTE  Remigio Mcmillon ZOX:[id number] DOB: [birth year] DOA: 07/04/2016   PCP: Samuel Jester, DO     Assessment/Plan: Principal Problem:   CORONARY ATHEROSCLEROSIS NATIVE CORONARY ARTERY Active Problems:   Type 2 diabetes mellitus, uncontrolled (HCC)   Essential hypertension   Chest pain   Acute renal failure superimposed on stage 3 chronic kidney disease (HCC)   45 y.o.malewith CAD (anterior MI s/p thrombectomy/BMS to mLAD 01/2009, ICM, DM, HTN, HLD, probable CKD II (Cr baseline 1.2-1.4), medication noncompliance, remote alcohol abuse admitted for chest pain  Assessment and plan #1Ischemic Cardiomyopathy:  cardiac enzymes negative   Cardiology following 2-D Echo -07/08/14- EF 40% to 45%.   Akinesis of the apicalanteroseptal and apical myocardium.   Features are consistent with a pseudonormal left ventricular   filling pattern, with concomitant abnormal relaxation and   increased filling pressure (grade 2 diastolic dysfunction Cardiac cath 07/05/16 - Diffuse in-stent restenosis, 99%, of the stent placed in the mid LAD Distal anterior wall and apical akinesis. LVEF 40%.  Continue Aspirin 81 mg /heparin drip.  For PCI tomorrow per cardiology   #2 CAD: Continue aspirin and statin    #3 Type 2 diabetes mellitus: SSI Oral med on hold  #4Acute on chronic CK D stage II: baseline creatinine around 1.4 creatinine greater than 2 post cath  continue IV fluids     DVT prophylaxsis heparin drip  Code Status:  Full code    Family Communication: Discussed in detail with the patient, all imaging results, lab results explained to the patient   Disposition Plan:  Anticipate discharge tomorrow      Consultants:  Cardiology  Procedures:  Cardiac cath  Antibiotics: Anti-infectives    Start     Dose/Rate Route Frequency Ordered Stop   07/07/16 1400  piperacillin-tazobactam (ZOSYN) IVPB 3.375 g  Status:  Discontinued     3.375 g 12.5  mL/hr over 240 Minutes Intravenous Every 8 hours 07/07/16 0950 07/07/16 0951   07/07/16 1000  piperacillin-tazobactam (ZOSYN) IVPB 3.375 g     3.375 g 12.5 mL/hr over 240 Minutes Intravenous Every 8 hours 07/07/16 0951     07/06/16 2200  cefTRIAXone (ROCEPHIN) 1 g in dextrose 5 % 50 mL IVPB  Status:  Discontinued     1 g 100 mL/hr over 30 Minutes Intravenous Every 24 hours 07/06/16 2103 07/07/16 0910         HPI/Subjective: No chest pains, no sob  Objective: Vitals:   07/07/16 1623 07/07/16 1833 07/07/16 2049 07/08/16 0417  BP:  117/72 115/75 114/71  Pulse:   65 72  Resp:   18 18  Temp: 99 F (37.2 C)  99.5 F (37.5 C) 98.4 F (36.9 C)  TempSrc: Oral  Oral Oral  SpO2: 91%  92% 100%  Weight:    77.6 kg (171 lb 1.6 oz)  Height:        Intake/Output Summary (Last 24 hours) at 07/08/16 1143 Last data filed at 07/08/16 0906  Gross per 24 hour  Intake          1603.81 ml  Output             1501 ml  Net           102.81 ml    Exam:  Examination:  General exam: NAD  Respiratory system: Clear to auscultation. Respiratory effort normal. Cardiovascular system: S1 & S2 heard, RRR. No JVD, murmurs, rubs, gallops or clicks. No pedal edema.  Gastrointestinal system: Abdomen is nondistended, soft and nontender. No organomegaly or masses felt. Normal bowel sounds heard. Central nervous system: Alert and oriented. No focal neurological deficits. Extremities: Symmetric 5 x 5 power. Skin: No rashes, lesions or ulcers Psychiatry: Judgement and insight appear normal. Mood & affect appropriate.     Data Reviewed: I have personally reviewed following labs and imaging studies  Micro Results Recent Results (from the past 240 hour(s))  Culture, blood (routine x 2)     Status: None (Preliminary result)   Collection Time: 07/06/16  9:52 PM  Result Value Ref Range Status   Specimen Description BLOOD BLOOD LEFT HAND  Final   Special Requests   Final    BOTTLES DRAWN AEROBIC AND  ANAEROBIC Blood Culture results may not be optimal due to an excessive volume of blood received in culture bottles   Culture NO GROWTH < 12 HOURS  Final   Report Status PENDING  Incomplete  Culture, blood (routine x 2)     Status: None (Preliminary result)   Collection Time: 07/06/16 10:00 PM  Result Value Ref Range Status   Specimen Description BLOOD BLOOD RIGHT HAND  Final   Special Requests   Final    BOTTLES DRAWN AEROBIC AND ANAEROBIC Blood Culture results may not be optimal due to an excessive volume of blood received in culture bottles   Culture NO GROWTH < 12 HOURS  Final   Report Status PENDING  Incomplete    Radiology Reports Dg Chest 2 View  Result Date: 07/04/2016 CLINICAL DATA:  Chest pain with dyspnea radiating across a chest EXAM: CHEST  2 VIEW COMPARISON:  11/07/2014 FINDINGS: The heart size and mediastinal contours are within normal limits. Both lungs are clear. The visualized skeletal structures are unremarkable. IMPRESSION: No active cardiopulmonary disease. Electronically Signed   By: Tollie Eth M.D.   On: 07/04/2016 23:05   Dg Chest Port 1 View  Result Date: 07/06/2016 CLINICAL DATA:  Fever EXAM: PORTABLE CHEST 1 VIEW COMPARISON:  07/04/2016 chest radiograph. FINDINGS: Low lung volumes. Stable cardiomediastinal silhouette with top-normal heart size. No pneumothorax. No pleural effusion. Patchy bibasilar lung opacities appear new. IMPRESSION: Low lung volumes with new patchy bibasilar lung opacities, which could represent atelectasis, aspiration and/ or pneumonia. Follow-up PA and lateral chest radiographs are warranted. Electronically Signed   By: Delbert Phenix M.D.   On: 07/06/2016 21:58     CBC  Recent Labs Lab 07/04/16 2220 07/06/16 0534 07/07/16 0245 07/08/16 0557  WBC 8.5 13.8* 12.0* 9.1  HGB 14.3 12.2* 10.9* 11.3*  HCT 42.3 36.3* 32.4* 32.5*  PLT 242 221 175 166  MCV 80.1 81.0 80.2 80.2  MCH 27.1 27.2 27.0 27.9  MCHC 33.8 33.6 33.6 34.8  RDW 13.1 13.7  13.3 13.5    Chemistries   Recent Labs Lab 07/06/16 0534 07/06/16 0806 07/06/16 1259 07/07/16 0245 07/08/16 0557  NA 137 137 139 135 136  K 4.6 4.5 4.2 3.6 4.0  CL 103 105 100* 100* 105  CO2 24 21* GLUCOSE 222* 209* 173* 123* 132*  BUN CREATININE 2.16* 2.09* 2.03* 1.82* 1.77*  CALCIUM 8.4* 8.3* 8.7* 8.1* 8.3*  AST 30  --   --  18 24  ALT 20  --   --  15* 16*  ALKPHOS 46  --   --  40 41  BILITOT 0.8  --   --  0.8 0.8   ------------------------------------------------------------------------------------------------------------------ estimated creatinine clearance  is 53.3 mL/min (A) (by C-G formula based on SCr of 1.77 mg/dL (H)). ------------------------------------------------------------------------------------------------------------------ No results for input(s): HGBA1C in the last 72 hours. ------------------------------------------------------------------------------------------------------------------  Recent Labs  07/06/16 0534  CHOL 115  HDL 54  LDLCALC 42  TRIG 94  CHOLHDL 2.1   ------------------------------------------------------------------------------------------------------------------ No results for input(s): TSH, T4TOTAL, T3FREE, THYROIDAB in the last 72 hours.  Invalid input(s): FREET3 ------------------------------------------------------------------------------------------------------------------ No results for input(s): VITAMINB12, FOLATE, FERRITIN, TIBC, IRON, RETICCTPCT in the last 72 hours.  Coagulation profile  Recent Labs Lab 07/05/16 0926  INR 1.14    No results for input(s): DDIMER in the last 72 hours.  Cardiac Enzymes  Recent Labs Lab 07/05/16 0608 07/05/16 0926 07/05/16 2213  TROPONINI <0.03 <0.03 <0.03   ------------------------------------------------------------------------------------------------------------------ Invalid input(s): POCBNP   CBG:  Recent Labs Lab 07/07/16 2041  07/08/16 0104 07/08/16 0351 07/08/16 0720 07/08/16 1130  GLUCAP 216* 144* 134* 124* 183*       Studies: Dg Chest Port 1 View  Result Date: 07/06/2016 CLINICAL DATA:  Fever EXAM: PORTABLE CHEST 1 VIEW COMPARISON:  07/04/2016 chest radiograph. FINDINGS: Low lung volumes. Stable cardiomediastinal silhouette with top-normal heart size. No pneumothorax. No pleural effusion. Patchy bibasilar lung opacities appear new. IMPRESSION: Low lung volumes with new patchy bibasilar lung opacities, which could represent atelectasis, aspiration and/ or pneumonia. Follow-up PA and lateral chest radiographs are warranted. Electronically Signed   By: Delbert Phenix M.D.   On: 07/06/2016 21:58      Lab Results  Component Value Date   HGBA1C 8.9 (H) 07/05/2016   HGBA1C (H) 05/04/2010    9.3 (NOTE)                                                                       According to the ADA Clinical Practice Recommendations for 2011, when HbA1c is used as a screening test:   >=6.5%   Diagnostic of Diabetes Mellitus           (if abnormal result  is confirmed)  5.7-6.4%   Increased risk of developing Diabetes Mellitus  References:Diagnosis and Classification of Diabetes Mellitus,Diabetes Care,2011,34(Suppl 1):S62-S69 and Standards of Medical Care in         Diabetes - 2011,Diabetes Care,2011,34  (Suppl 1):S11-S61.   HGBA1C (H) 02/01/2009    9.0 (NOTE) The ADA recommends the following therapeutic goal for glycemic control related to Hgb A1c measurement: Goal of therapy: <6.5 Hgb A1c  Reference: American Diabetes Association: Clinical Practice Recommendations 2010, Diabetes Care, 2010, 33: (Suppl  1).   Lab Results  Component Value Date   LDLCALC 42 07/06/2016   CREATININE 1.77 (H) 07/08/2016       Scheduled Meds: . aspirin EC  81 mg Oral Daily  . atorvastatin  80 mg Oral Daily  . citalopram  20 mg Oral Daily  . insulin aspart  0-9 Units Subcutaneous Q4H  . piperacillin-tazobactam (ZOSYN)  IV  3.375 g  Intravenous Q8H  . sodium chloride flush  3 mL Intravenous Q12H  . sodium chloride flush  3 mL Intravenous Q12H  . sodium chloride flush  3 mL Intravenous Q12H   Continuous Infusions: . heparin 1,800 Units/hr (07/08/16 0901)  . nitroGLYCERIN 10 mcg/min (07/08/16 0904)     LOS: 3  days    Time spent: 25 MINS    OSEI-BONSU,Bodin Gorka  [facility] Pager [phone]. If 7PM-7AM, please contact night-coverage at [email or web address], password [facility] 07/08/2016, 11:43 AM  LOS: 3 days

## 2016-07-09 ENCOUNTER — Inpatient Hospital Stay (HOSPITAL_COMMUNITY)
Admission: EM | Disposition: A | Discharge status: Home / Self Care | Payer: Self-pay | Source: Home / Self Care | Attending: Internal Medicine

## 2016-07-09 ENCOUNTER — Encounter (HOSPITAL_COMMUNITY): Payer: Self-pay | Admitting: Cardiovascular Disease

## 2016-07-09 DIAGNOSIS — N170 Acute kidney failure with tubular necrosis: Secondary | ICD-10-CM

## 2016-07-09 DIAGNOSIS — N183 Chronic kidney disease, stage 3 (moderate): Secondary | ICD-10-CM

## 2016-07-09 HISTORY — PX: CORONARY STENT INTERVENTION: CATH118234

## 2016-07-09 LAB — BASIC METABOLIC PANEL
Anion gap: 9 (ref 5–15)
BUN: 11 mg/dL (ref 6–20)
CO2: 24 mmol/L (ref 22–32)
Calcium: 8.2 mg/dL — ABNORMAL LOW (ref 8.9–10.3)
Chloride: 104 mmol/L (ref 101–111)
Creatinine, Ser: 1.71 mg/dL — ABNORMAL HIGH (ref 0.61–1.24)
GFR calc Af Amer: 54 mL/min — ABNORMAL LOW (ref >60)
GFR, EST NON AFRICAN AMERICAN: 47 mL/min — AB (ref >60)
Glucose, Bld: 136 mg/dL — ABNORMAL HIGH (ref 65–99)
POTASSIUM: 4 mmol/L (ref 3.5–5.1)
SODIUM: 137 mmol/L (ref 135–145)

## 2016-07-09 LAB — GLUCOSE, CAPILLARY
GLUCOSE-CAPILLARY: 118 mg/dL — AB (ref 65–99)
GLUCOSE-CAPILLARY: 128 mg/dL — AB (ref 65–99)
GLUCOSE-CAPILLARY: 166 mg/dL — AB (ref 65–99)
GLUCOSE-CAPILLARY: 218 mg/dL — AB (ref 65–99)
GLUCOSE-CAPILLARY: 237 mg/dL — AB (ref 65–99)
Glucose-Capillary: 130 mg/dL — ABNORMAL HIGH (ref 65–99)

## 2016-07-09 LAB — POCT ACTIVATED CLOTTING TIME: Activated Clotting Time: 477 seconds

## 2016-07-09 LAB — HEPARIN LEVEL (UNFRACTIONATED): Heparin Unfractionated: 0.28 IU/mL — ABNORMAL LOW (ref 0.30–0.70)

## 2016-07-09 SURGERY — CORONARY STENT INTERVENTION
Anesthesia: LOCAL

## 2016-07-09 MED ORDER — CLOPIDOGREL BISULFATE 75 MG PO TABS
75.0000 mg | ORAL_TABLET | Freq: Every day | ORAL | Status: DC
  Administered 2016-07-10 – 2016-07-12 (×3): 75 mg via ORAL
  Filled 2016-07-09 (×3): qty 1

## 2016-07-09 MED ORDER — MIDAZOLAM HCL 2 MG/2ML IJ SOLN
INTRAMUSCULAR | Status: AC
  Filled 2016-07-09: qty 2

## 2016-07-09 MED ORDER — MIDAZOLAM HCL 2 MG/2ML IJ SOLN
INTRAMUSCULAR | Status: DC | PRN
  Administered 2016-07-09 (×2): 0.5 mg via INTRAVENOUS

## 2016-07-09 MED ORDER — SODIUM CHLORIDE 0.9% FLUSH
3.0000 mL | Freq: Two times a day (BID) | INTRAVENOUS | Status: DC
  Administered 2016-07-11: 02:00:00 3 mL via INTRAVENOUS

## 2016-07-09 MED ORDER — IOPAMIDOL (ISOVUE-370) INJECTION 76%
INTRAVENOUS | Status: AC
  Filled 2016-07-09: qty 125

## 2016-07-09 MED ORDER — NITROGLYCERIN 1 MG/10 ML FOR IR/CATH LAB
INTRA_ARTERIAL | Status: AC
  Filled 2016-07-09: qty 10

## 2016-07-09 MED ORDER — LIDOCAINE HCL (PF) 1 % IJ SOLN
INTRAMUSCULAR | Status: AC
  Filled 2016-07-09: qty 30

## 2016-07-09 MED ORDER — FENTANYL CITRATE (PF) 100 MCG/2ML IJ SOLN
INTRAMUSCULAR | Status: AC
  Filled 2016-07-09: qty 2

## 2016-07-09 MED ORDER — MORPHINE SULFATE (PF) 2 MG/ML IV SOLN
2.0000 mg | INTRAVENOUS | Status: DC | PRN
  Administered 2016-07-09 – 2016-07-11 (×2): 2 mg via INTRAVENOUS
  Filled 2016-07-09 (×2): qty 1

## 2016-07-09 MED ORDER — BIVALIRUDIN 250 MG IV SOLR
INTRAVENOUS | Status: AC
  Filled 2016-07-09: qty 250

## 2016-07-09 MED ORDER — ASPIRIN 81 MG PO CHEW
81.0000 mg | CHEWABLE_TABLET | Freq: Every day | ORAL | Status: DC
  Administered 2016-07-10: 09:00:00 81 mg via ORAL
  Filled 2016-07-09: qty 1

## 2016-07-09 MED ORDER — LABETALOL HCL 5 MG/ML IV SOLN: 10.0000 mg | INTRAVENOUS | Status: AC | PRN

## 2016-07-09 MED ORDER — ACETAMINOPHEN 325 MG PO TABS: 650.0000 mg | ORAL_TABLET | ORAL | Status: DC | PRN

## 2016-07-09 MED ORDER — HYDRALAZINE HCL 20 MG/ML IJ SOLN
10.0000 mg | INTRAMUSCULAR | Status: DC | PRN
  Administered 2016-07-11: 17:00:00 10 mg via INTRAVENOUS
  Filled 2016-07-09: qty 1

## 2016-07-09 MED ORDER — ONDANSETRON HCL 4 MG/2ML IJ SOLN: 4.0000 mg | Freq: Four times a day (QID) | INTRAMUSCULAR | Status: DC | PRN

## 2016-07-09 MED ORDER — BIVALIRUDIN BOLUS VIA INFUSION - CUPID
INTRAVENOUS | Status: DC | PRN
  Administered 2016-07-09: 22.68 mg via INTRAVENOUS

## 2016-07-09 MED ORDER — SODIUM CHLORIDE 0.9 % IV SOLN
INTRAVENOUS | Status: DC | PRN
  Administered 2016-07-09: 1.75 mg/kg/h via INTRAVENOUS

## 2016-07-09 MED ORDER — HEPARIN (PORCINE) IN NACL 2-0.9 UNIT/ML-% IJ SOLN
INTRAMUSCULAR | Status: AC
  Filled 2016-07-09: qty 1000

## 2016-07-09 MED ORDER — LIDOCAINE HCL (PF) 1 % IJ SOLN
INTRAMUSCULAR | Status: DC | PRN
Start: 2016-07-09 — End: 2016-07-09
  Administered 2016-07-09: 20 mL via INTRADERMAL

## 2016-07-09 MED ORDER — IOPAMIDOL (ISOVUE-370) INJECTION 76%
INTRAVENOUS | Status: DC | PRN
  Administered 2016-07-09: 150 mL via INTRA_ARTERIAL

## 2016-07-09 MED ORDER — INSULIN ASPART 100 UNIT/ML ~~LOC~~ SOLN
0.0000 [IU] | Freq: Three times a day (TID) | SUBCUTANEOUS | Status: DC
  Administered 2016-07-10: 12:00:00 3 [IU] via SUBCUTANEOUS
  Administered 2016-07-10: 2 [IU] via SUBCUTANEOUS
  Administered 2016-07-10: 18:00:00 3 [IU] via SUBCUTANEOUS
  Administered 2016-07-11: 07:00:00 2 [IU] via SUBCUTANEOUS

## 2016-07-09 MED ORDER — IOPAMIDOL (ISOVUE-370) INJECTION 76%
INTRAVENOUS | Status: AC
  Filled 2016-07-09: qty 100

## 2016-07-09 MED ORDER — INSULIN ASPART 100 UNIT/ML ~~LOC~~ SOLN
0.0000 [IU] | Freq: Every day | SUBCUTANEOUS | Status: DC
  Administered 2016-07-09: 2 [IU] via SUBCUTANEOUS
  Administered 2016-07-11: 22:00:00 3 [IU] via SUBCUTANEOUS

## 2016-07-09 MED ORDER — SODIUM CHLORIDE 0.9 % IV SOLN: 250.0000 mL | INTRAVENOUS | Status: DC | PRN

## 2016-07-09 MED ORDER — HYDRALAZINE HCL 20 MG/ML IJ SOLN: 5.0000 mg | INTRAMUSCULAR | Status: AC | PRN

## 2016-07-09 MED ORDER — HEPARIN (PORCINE) IN NACL 2-0.9 UNIT/ML-% IJ SOLN
INTRAMUSCULAR | Status: DC | PRN
  Administered 2016-07-09: 1000 mL via INTRA_ARTERIAL

## 2016-07-09 MED ORDER — SODIUM CHLORIDE 0.9% FLUSH: 3.0000 mL | INTRAVENOUS | Status: DC | PRN

## 2016-07-09 MED ORDER — CLOPIDOGREL BISULFATE 75 MG PO TABS
300.0000 mg | ORAL_TABLET | Freq: Once | ORAL | Status: AC
  Administered 2016-07-09: 300 mg via ORAL
  Filled 2016-07-09: qty 4

## 2016-07-09 MED ORDER — FENTANYL CITRATE (PF) 100 MCG/2ML IJ SOLN
INTRAMUSCULAR | Status: DC | PRN
  Administered 2016-07-09 (×2): 25 ug via INTRAVENOUS

## 2016-07-09 MED ORDER — SODIUM CHLORIDE 0.9 % IV SOLN
INTRAVENOUS | Status: AC
  Administered 2016-07-09: 13:00:00 via INTRAVENOUS

## 2016-07-09 SURGICAL SUPPLY — 12 items
BALLN EMERGE MR PUSH 1.5X12 (BALLOONS) ×2
BALLOON EMERGE MR PUSH 1.5X12 (BALLOONS) IMPLANT
CATH VISTA GUIDE 6FR XB3 (CATHETERS) ×1 IMPLANT
KIT ENCORE 26 ADVANTAGE (KITS) ×3 IMPLANT
KIT HEART LEFT (KITS) ×2 IMPLANT
PACK CARDIAC CATHETERIZATION (CUSTOM PROCEDURE TRAY) ×2 IMPLANT
SHEATH PINNACLE 6F 10CM (SHEATH) ×1 IMPLANT
TRANSDUCER W/STOPCOCK (MISCELLANEOUS) ×2 IMPLANT
TUBING CIL FLEX 10 FLL-RA (TUBING) ×2 IMPLANT
WIRE ASAHI FIELDER XT 190CM (WIRE) ×1 IMPLANT
WIRE EMERALD 3MM-J .035X150CM (WIRE) ×1 IMPLANT
WIRE PT2 MS 185 (WIRE) ×1 IMPLANT

## 2016-07-09 NOTE — Interval H&P Note (Signed)
Cath Lab Visit (complete for each Cath Lab visit)  Clinical Evaluation Leading to the Procedure:   ACS: Yes.    Non-ACS:    Anginal Classification: CCS III  Anti-ischemic medical therapy: Minimal Therapy (1 class of medications)  Non-Invasive Test Results: No non-invasive testing performed  Prior CABG: No previous CABG      History and Physical Interval Note:  07/09/2016 11:12 AM  [name]  has presented today for surgery, with the diagnosis of CAD  The various methods of treatment have been discussed with the patient and family. After consideration of risks, benefits and other options for treatment, the patient has consented to  Procedure(s): Coronary Stent Intervention (N/A) as a surgical intervention .  The patient's history has been reviewed, patient examined, no change in status, stable for surgery.  I have reviewed the patient's chart and labs.  Questions were answered to the patient's satisfaction.     Nanetta Batty

## 2016-07-09 NOTE — Progress Notes (Signed)
ANTIBIOTIC CONSULT NOTE - INITIAL  Pharmacy Consult for Zosyn Indication: pneumonia  No Known Allergies  Patient Measurements: Height:  (175.3 cm) Weight: 166 lb 11.2 oz (75.6 kg) IBW/kg (Calculated) : 70.7 Adjusted Body Weight:    Vital Signs: Temp: 97.8 F (36.6 C) (04/16 0459) Temp Source: Oral (04/16 0459) BP: 108/66 (04/16 0459) Intake/Output from previous day: 04/15 0701 - 04/16 0700 In: 1470.7 [P.O.:600; I.V.:720.7; IV Piggyback:150] Out: 1500 [Urine:1500] Intake/Output from this shift: Total I/O In: 293.4 [I.V.:293.4] Out: -   Labs:  Recent Labs  07/07/16 0245 07/08/16 0557 07/09/16 0432  WBC 12.0* 9.1  --   HGB 10.9* 11.3*  --   PLT 175 166  --   CREATININE 1.82* 1.77* 1.71*   Estimated Creatinine Clearance: 55.1 mL/min (A) (by C-G formula based on SCr of 1.71 mg/dL (H)). No results for input(s): VANCOTROUGH, VANCOPEAK, VANCORANDOM, GENTTROUGH, GENTPEAK, GENTRANDOM, TOBRATROUGH, TOBRAPEAK, TOBRARND, AMIKACINPEAK, AMIKACINTROU, AMIKACIN in the last 72 hours.   Microbiology:   Medical History: Past Medical History:  Diagnosis Date  . Alcohol abuse   . Anterior myocardial infarction [facility])    November 2010  . CAD (coronary artery disease), native coronary artery    Thrombectomy with BMS to mid LAD November 2010  . CKD (chronic kidney disease), stage II    a. baseline Cr appears 1.2-1.4 per labs.  . Essential hypertension   . History of nonadherence to medical treatment   . Ischemic cardiomyopathy    a. EF 40% by nuc in 2012.  . Type 2 diabetes mellitus (HCC)     Assessment: ID: Abx D#4 for HCAP vs aspiration - Tmax 99, wbc down 9.1. BC pending. Scr 1.71 (CrCl 55). BC pending.  CTX 4/13 >> 4/14 Zosyn 4/14 >>  4/13 Blood Cx: ngtd  Goal of Therapy:  Eradication of infection  Plan:  Zosyn 3.375g IV q8hr. Dose ok for renal function. Pharmacy will sign off. Please reconsult for further dosing assitance.  [name] S. Merilynn Finland, PharmD,  BCPS Clinical Staff Pharmacist Pager [phone]  Misty Stanley Stillinger 07/09/2016,1:54 PM

## 2016-07-09 NOTE — Progress Notes (Signed)
ANTICOAGULATION CONSULT NOTE - Follow Up Consult  Pharmacy Consult for Heparin Indication: chest pain/ACS  No Known Allergies  Patient Measurements: Height:  (175.3 cm) Weight: 166 lb 11.2 oz (75.6 kg) IBW/kg (Calculated) : 70.7 Heparin Dosing Weight:    Vital Signs: Temp: 97.8 F (36.6 C) (04/16 0459) Temp Source: Oral (04/16 0459) BP: 108/66 (04/16 0459) Pulse Rate: 61 (04/15 2006)  Labs:  Recent Labs  07/07/16 0245  07/07/16 1926 07/08/16 0557 07/09/16 0331 07/09/16 0432  HGB 10.9*  --   --  11.3*  --   --   HCT 32.4*  --   --  32.5*  --   --   PLT 175  --   --  166  --   --   HEPARINUNFRC 0.31  < > 0.38 0.31 0.28*  --   CREATININE 1.82*  --   --  1.77*  --  1.71*  < > = values in this interval not displayed.  Estimated Creatinine Clearance: 55.1 mL/min (A) (by C-G formula based on SCr of 1.71 mg/dL (H)).   Assessment:  Anticoag: HepRx for chest pain, had cath 4/12 - was to go for PCI 4/13, but with AKI postponed until 4/16.  - HL 0.31>0.28 this AM. No CBC done.  Goal of Therapy:  Heparin level 0.3-0.7 units/ml Monitor platelets by anticoagulation protocol: Yes   Plan:  Heparin drip increase to 1900 units/hr Daily heparin level and CBC Cath on Monday for stent placement   Willow Reczek S. Merilynn Finland, PharmD, BCPS Clinical Staff Pharmacist Pager [phone]  Misty Stanley Stillinger 07/09/2016,7:51 AM

## 2016-07-09 NOTE — Progress Notes (Signed)
Site area: right groin  Site Prior to Removal:  Level 0  Pressure Applied For 20 MINUTES    Minutes Beginning at 1425  Manual:   Yes.    Patient Status During Pull:  stable  Post Pull Groin Site:  Level 0  Post Pull Instructions Given:  Yes.    Post Pull Pulses Present:  Yes.    Dressing Applied:  Yes.    Comments:

## 2016-07-09 NOTE — H&P (View-Only) (Signed)
Progress Note  Patient Name: [name] Date of Encounter: 07/09/2016  Primary Cardiologist: Dr Antoine Poche  Subjective   Pt denies CP or dyspnea  Inpatient Medications    Scheduled Meds: . aspirin EC  81 mg Oral Daily  . atorvastatin  80 mg Oral Daily  . citalopram  20 mg Oral Daily  . insulin aspart  0-9 Units Subcutaneous Q4H  . piperacillin-tazobactam (ZOSYN)  IV  3.375 g Intravenous Q8H  . sodium chloride flush  3 mL Intravenous Q12H  . sodium chloride flush  3 mL Intravenous Q12H  . sodium chloride flush  3 mL Intravenous Q12H  . sodium chloride flush  3 mL Intravenous Q12H   Continuous Infusions: . sodium chloride    . heparin 1,800 Units/hr (07/08/16 1932)  . nitroGLYCERIN 16.667 mcg/min (07/09/16 0655)   PRN Meds: sodium chloride, sodium chloride, sodium chloride, sodium chloride, acetaminophen, morphine injection, nitroGLYCERIN, ondansetron (ZOFRAN) IV, ondansetron (ZOFRAN) IV, sodium chloride flush, sodium chloride flush, sodium chloride flush, sodium chloride flush   Vital Signs    Vitals:   07/08/16 1328 07/08/16 2006 07/09/16 0459 07/09/16 0500  BP: 123/79 126/75 108/66   Pulse: 77 61    Resp: 18  16   Temp: 99 F (37.2 C) 98.7 F (37.1 C) 97.8 F (36.6 C)   TempSrc: Oral Oral Oral   SpO2: 96% 92% 93%   Weight:    166 lb 11.2 oz (75.6 kg)  Height:        Intake/Output Summary (Last 24 hours) at 07/09/16 0759 Last data filed at 07/09/16 0500  Gross per 24 hour  Intake          1237.94 ml  Output              900 ml  Net           337.94 ml   Filed Weights   07/07/16 0415 07/08/16 0417 07/09/16 0500  Weight: 172 lb 8 oz (78.2 kg) 171 lb 1.6 oz (77.6 kg) 166 lb 11.2 oz (75.6 kg)    Telemetry    Sinus to sinus bradycardia- Personally Reviewed  Physical Exam   GEN: No acute distress.   Neck: No JVD Cardiac: RRR, no murmurs, rubs, or gallops.  Respiratory: Clear to auscultation bilaterally. GI: Soft, nontender, non-distended  MS: No  edema; No deformity. Neuro:  Nonfocal  Psych: Normal affect   Labs    Chemistry Recent Labs Lab 07/06/16 0534  07/07/16 0245 07/08/16 0557 07/09/16 0432  NA 137  < > 135 136 137  K 4.6  < > 3.6 4.0 4.0  CL 103  < > 100* 105 104  CO2 24  < > GLUCOSE 222*  < > 123* 132* 136*  BUN 17  < > CREATININE 2.16*  < > 1.82* 1.77* 1.71*  CALCIUM 8.4*  < > 8.1* 8.3* 8.2*  PROT 5.7*  --  5.1* 5.6*  --   ALBUMIN 3.3*  --  2.8* 2.8*  --   AST 30  --  18 24  --   ALT 20  --  15* 16*  --   ALKPHOS 46  --  40 41  --   BILITOT 0.8  --  0.8 0.8  --   GFRNONAA 35*  < > 44* 45* 47*  GFRAA 41*  < > 50* 52* 54*  ANIONGAP 10  < > < > = values  in this interval not displayed.   Hematology Recent Labs Lab 07/06/16 0534 07/07/16 0245 07/08/16 0557  WBC 13.8* 12.0* 9.1  RBC 4.48 4.04* 4.05*  HGB 12.2* 10.9* 11.3*  HCT 36.3* 32.4* 32.5*  MCV 81.0 80.2 80.2  MCH 27.2 27.0 27.9  MCHC 33.6 33.6 34.8  RDW 13.7 13.3 13.5  PLT 221 175 166    Cardiac Enzymes Recent Labs Lab 07/05/16 0313 07/05/16 0608 07/05/16 0926 07/05/16 2213  TROPONINI <0.03 <0.03 <0.03 <0.03    Recent Labs Lab 07/04/16 2249  TROPIPOC 0.00     Patient Profile     46 y.o. male with diabetes mellitus and history of coronary disease admitted with chest pain. Cardiac catheterization reveals diffuse in-stent restenosis from 99% in the LAD, 60% right coronary artery and ejection fraction 40%. Also with renal insufficiency.  Assessment & Plan    1 Coronary artery disease-planned PCI of LAD today. We will need to follow renal function closely following procedure given baseline renal insufficiency and risk of contrast nephropathy. He has been hydrated prior to the procedure. Continue aspirin, statin, heparin and nitroglycerin. Add coreg 3.125 mg BID.  2 chronic stage III kidney disease-follow renal function closely after procedure.  3 hyperlipidemia-continue statin.  4 ischemic  cardiomyopathy-would consider ARB after procedure when renal function stable.  Signed, Olga Millers, MD  07/09/2016, 7:59 AM

## 2016-07-09 NOTE — Progress Notes (Signed)
[facility] PROGRESS NOTE  [name] ZOX:[id number] DOB: February 29, 1971 DOA: 07/04/2016   PCP: Samuel Jester, DO     Assessment/Plan: Principal Problem:   CORONARY ATHEROSCLEROSIS NATIVE CORONARY ARTERY Active Problems:   Type 2 diabetes mellitus, uncontrolled (HCC)   Essential hypertension   Chest pain   Acute renal failure superimposed on stage 3 chronic kidney disease (HCC)   46 y.o.malewith CAD (anterior MI s/p thrombectomy/BMS to mLAD 01/2009, ICM, DM, HTN, HLD, probable CKD II (Cr baseline 1.2-1.4), medication noncompliance, remote alcohol abuse admitted for chest pain. Patient underwent cardiac cath on 4/12. Found to have  in-stent restenosis, creatinine increase post cath. Subsequently patient spiked a fever on 4/14. Multiple ongoing issues, not stable for discharge  Assessment and plan Chest pain - cardiac enzymes negative - given risk factors  cardiology was consulted, appreciate cardiology recommendations. Cardiac cath showed Diffuse in-stent restenosis, 99%, of the stent placed in the mid LAD Distal anterior wall and apical akinesis. LVEF 40%.   . 2-D echo -EF 40-45% Continue Aspirin 81 mg /heparin/nitroglycerin drip. Patient scheduled to have LAD stent 4/16 ,   postponed until Monday secondary to acute kidney injury. Hold off on beta blocker as heart rate is in the 60s..  Further treatment based on cardiology recommendations  Fever likely secondary to pneumonia-post cath Started on  Zosyn, follow blood cultures Check UA, chest x-ray suspicious for possible aspiration Vancomycin not initiated secondary to acute kidney injury Fever curve improving    CORONARY ATHEROSCLEROSIS NATIVE CORONARY ARTERY - Continue aspirin and statin   Essential hypertension -  blood pressure stable monitor   Type 2 diabetes mellitus, uncontrolled (HCC) - hold PO meds order sliding scale. Hemoglobin A1c 8.9  Acute on chronic CK D stage II-baseline creatinine around 1.4,  creatinine greater than 2 post cath, now 1.82>1.7, continue to follow renal function after PCI     DVT prophylaxsis heparin drip  Code Status:  Full code    Family Communication: Discussed in detail with the patient, all imaging results, lab results explained to the patient   Disposition Plan:  Anticipate discharge next week, multiple reasons to delay repeat cardiac cath      Consultants:  Cardiology  Procedures:  Cardiac cath  Antibiotics: Anti-infectives    Start     Dose/Rate Route Frequency Ordered Stop   07/07/16 1400  piperacillin-tazobactam (ZOSYN) IVPB 3.375 g  Status:  Discontinued     3.375 g 12.5 mL/hr over 240 Minutes Intravenous Every 8 hours 07/07/16 0950 07/07/16 0951   07/07/16 1000  piperacillin-tazobactam (ZOSYN) IVPB 3.375 g     3.375 g 12.5 mL/hr over 240 Minutes Intravenous Every 8 hours 07/07/16 0951     07/06/16 2200  cefTRIAXone (ROCEPHIN) 1 g in dextrose 5 % 50 mL IVPB  Status:  Discontinued     1 g 100 mL/hr over 30 Minutes Intravenous Every 24 hours 07/06/16 2103 07/07/16 0910         HPI/Subjective: Denies any cough, chest pain, shortness of breath  Objective: Vitals:   07/08/16 1328 07/08/16 2006 07/09/16 0459 07/09/16 0500  BP: 123/79 126/75 108/66   Pulse: 77 61    Resp: 18  16   Temp: 99 F (37.2 C) 98.7 F (37.1 C) 97.8 F (36.6 C)   TempSrc: Oral Oral Oral   SpO2: 96% 92% 93%   Weight:    75.6 kg (166 lb 11.2 oz)  Height:        Intake/Output  Summary (Last 24 hours) at 07/09/16 0947 Last data filed at 07/09/16 0827  Gross per 24 hour  Intake          1644.13 ml  Output              900 ml  Net           744.13 ml    Exam:  Examination:  General exam: Appears calm and comfortable  Respiratory system: Clear to auscultation. Respiratory effort normal. Cardiovascular system: S1 & S2 heard, RRR. No JVD, murmurs, rubs, gallops or clicks. No pedal edema. Gastrointestinal system: Abdomen is nondistended, soft and  nontender. No organomegaly or masses felt. Normal bowel sounds heard. Central nervous system: Alert and oriented. No focal neurological deficits. Extremities: Symmetric 5 x 5 power. Skin: No rashes, lesions or ulcers Psychiatry: Judgement and insight appear normal. Mood & affect appropriate.     Data Reviewed: I have personally reviewed following labs and imaging studies  Micro Results Recent Results (from the past 240 hour(s))  Culture, blood (routine x 2)     Status: None (Preliminary result)   Collection Time: 07/06/16  9:52 PM  Result Value Ref Range Status   Specimen Description BLOOD BLOOD LEFT HAND  Final   Special Requests   Final    BOTTLES DRAWN AEROBIC AND ANAEROBIC Blood Culture results may not be optimal due to an excessive volume of blood received in culture bottles   Culture NO GROWTH 2 DAYS  Final   Report Status PENDING  Incomplete  Culture, blood (routine x 2)     Status: None (Preliminary result)   Collection Time: 07/06/16 10:00 PM  Result Value Ref Range Status   Specimen Description BLOOD BLOOD RIGHT HAND  Final   Special Requests   Final    BOTTLES DRAWN AEROBIC AND ANAEROBIC Blood Culture results may not be optimal due to an excessive volume of blood received in culture bottles   Culture NO GROWTH 2 DAYS  Final   Report Status PENDING  Incomplete    Radiology Reports Dg Chest 2 View  Result Date: 07/04/2016 CLINICAL DATA:  Chest pain with dyspnea radiating across a chest EXAM: CHEST  2 VIEW COMPARISON:  11/07/2014 FINDINGS: The heart size and mediastinal contours are within normal limits. Both lungs are clear. The visualized skeletal structures are unremarkable. IMPRESSION: No active cardiopulmonary disease. Electronically Signed   By: Tollie Eth M.D.   On: 07/04/2016 23:05   Dg Chest Port 1 View  Result Date: 07/06/2016 CLINICAL DATA:  Fever EXAM: PORTABLE CHEST 1 VIEW COMPARISON:  07/04/2016 chest radiograph. FINDINGS: Low lung volumes. Stable  cardiomediastinal silhouette with top-normal heart size. No pneumothorax. No pleural effusion. Patchy bibasilar lung opacities appear new. IMPRESSION: Low lung volumes with new patchy bibasilar lung opacities, which could represent atelectasis, aspiration and/ or pneumonia. Follow-up PA and lateral chest radiographs are warranted. Electronically Signed   By: Delbert Phenix M.D.   On: 07/06/2016 21:58     CBC  Recent Labs Lab 07/04/16 2220 07/06/16 0534 07/07/16 0245 07/08/16 0557  WBC 8.5 13.8* 12.0* 9.1  HGB 14.3 12.2* 10.9* 11.3*  HCT 42.3 36.3* 32.4* 32.5*  PLT 242 221 175 166  MCV 80.1 81.0 80.2 80.2  MCH 27.1 27.2 27.0 27.9  MCHC 33.8 33.6 33.6 34.8  RDW 13.1 13.7 13.3 13.5    Chemistries   Recent Labs Lab 07/06/16 0534 07/06/16 0806 07/06/16 1259 07/07/16 0245 07/08/16 0557 07/09/16 0432  NA 137 137 139  135 136 137  K 4.6 4.5 4.2 3.6 4.0 4.0  CL 103 105 100* 100* 105 104  CO2 24 21* GLUCOSE 222* 209* 173* 123* 132* 136*  BUN CREATININE 2.16* 2.09* 2.03* 1.82* 1.77* 1.71*  CALCIUM 8.4* 8.3* 8.7* 8.1* 8.3* 8.2*  AST 30  --   --  18 24  --   ALT 20  --   --  15* 16*  --   ALKPHOS 46  --   --  40 41  --   BILITOT 0.8  --   --  0.8 0.8  --    ------------------------------------------------------------------------------------------------------------------ estimated creatinine clearance is 55.1 mL/min (A) (by C-G formula based on SCr of 1.71 mg/dL (H)). ------------------------------------------------------------------------------------------------------------------ No results for input(s): HGBA1C in the last 72 hours. ------------------------------------------------------------------------------------------------------------------ No results for input(s): CHOL, HDL, LDLCALC, TRIG, CHOLHDL, LDLDIRECT in the last 72  hours. ------------------------------------------------------------------------------------------------------------------ No results for input(s): TSH, T4TOTAL, T3FREE, THYROIDAB in the last 72 hours.  Invalid input(s): FREET3 ------------------------------------------------------------------------------------------------------------------ No results for input(s): VITAMINB12, FOLATE, FERRITIN, TIBC, IRON, RETICCTPCT in the last 72 hours.  Coagulation profile  Recent Labs Lab 07/05/16 0926  INR 1.14    No results for input(s): DDIMER in the last 72 hours.  Cardiac Enzymes  Recent Labs Lab 07/05/16 0608 07/05/16 0926 07/05/16 2213  TROPONINI <0.03 <0.03 <0.03   ------------------------------------------------------------------------------------------------------------------ Invalid input(s): POCBNP   CBG:  Recent Labs Lab 07/08/16 1551 07/08/16 2005 07/09/16 0021 07/09/16 0457 07/09/16 0743  GLUCAP 206* 261* 166* 128* 130*       Studies: No results found.    Lab Results  Component Value Date   HGBA1C 8.9 (H) 07/05/2016   HGBA1C (H) 05/04/2010    9.3 (NOTE)                                                                       According to the ADA Clinical Practice Recommendations for 2011, when HbA1c is used as a screening test:   >=6.5%   Diagnostic of Diabetes Mellitus           (if abnormal result  is confirmed)  5.7-6.4%   Increased risk of developing Diabetes Mellitus  References:Diagnosis and Classification of Diabetes Mellitus,Diabetes Care,2011,34(Suppl 1):S62-S69 and Standards of Medical Care in         Diabetes - 2011,Diabetes Care,2011,34  (Suppl 1):S11-S61.   HGBA1C (H) 02/01/2009    9.0 (NOTE) The ADA recommends the following therapeutic goal for glycemic control related to Hgb A1c measurement: Goal of therapy: <6.5 Hgb A1c  Reference: American Diabetes Association: Clinical Practice Recommendations 2010, Diabetes Care, 2010, 33: (Suppl  1).    Lab Results  Component Value Date   LDLCALC 42 07/06/2016   CREATININE 1.71 (H) 07/09/2016       Scheduled Meds: . aspirin EC  81 mg Oral Daily  . atorvastatin  80 mg Oral Daily  . citalopram  20 mg Oral Daily  . insulin aspart  0-9 Units Subcutaneous Q4H  . piperacillin-tazobactam (ZOSYN)  IV  3.375 g Intravenous Q8H  . sodium chloride flush  3 mL Intravenous Q12H  . sodium chloride flush  3 mL Intravenous Q12H  . sodium chloride  flush  3 mL Intravenous Q12H  . sodium chloride flush  3 mL Intravenous Q12H   Continuous Infusions: . sodium chloride 1 mL/kg/hr (07/09/16 0700)  . heparin 1,900 Units/hr (07/09/16 0827)  . nitroGLYCERIN 16.667 mcg/min (07/09/16 0655)     LOS: 4 days    Time spent: >30 MINS    Richarda Overlie  [facility] Pager [phone]. If 7PM-7AM, please contact night-coverage at [email or web address], password [email or web address] 07/09/2016, 9:47 AM  LOS: 4 days

## 2016-07-09 NOTE — Progress Notes (Signed)
 Progress Note  Patient Name: [name] Date of Encounter: 07/09/2016  Primary Cardiologist: Dr Hochrein  Subjective   Pt denies CP or dyspnea  Inpatient Medications    Scheduled Meds: . aspirin EC  81 mg Oral Daily  . atorvastatin  80 mg Oral Daily  . citalopram  20 mg Oral Daily  . insulin aspart  0-9 Units Subcutaneous Q4H  . piperacillin-tazobactam (ZOSYN)  IV  3.375 g Intravenous Q8H  . sodium chloride flush  3 mL Intravenous Q12H  . sodium chloride flush  3 mL Intravenous Q12H  . sodium chloride flush  3 mL Intravenous Q12H  . sodium chloride flush  3 mL Intravenous Q12H   Continuous Infusions: . sodium chloride    . heparin 1,800 Units/hr (07/08/16 1932)  . nitroGLYCERIN 16.667 mcg/min (07/09/16 0655)   PRN Meds: sodium chloride, sodium chloride, sodium chloride, sodium chloride, acetaminophen, morphine injection, nitroGLYCERIN, ondansetron (ZOFRAN) IV, ondansetron (ZOFRAN) IV, sodium chloride flush, sodium chloride flush, sodium chloride flush, sodium chloride flush   Vital Signs    Vitals:   07/08/16 1328 07/08/16 2006 07/09/16 0459 07/09/16 0500  BP: 123/79 126/75 108/66   Pulse: 77 61    Resp: 18  16   Temp: 99 F (37.2 C) 98.7 F (37.1 C) 97.8 F (36.6 C)   TempSrc: Oral Oral Oral   SpO2: 96% 92% 93%   Weight:    166 lb 11.2 oz (75.6 kg)  Height:        Intake/Output Summary (Last 24 hours) at 07/09/16 0759 Last data filed at 07/09/16 0500  Gross per 24 hour  Intake          1237.94 ml  Output              900 ml  Net           337.94 ml   Filed Weights   07/07/16 0415 07/08/16 0417 07/09/16 0500  Weight: 172 lb 8 oz (78.2 kg) 171 lb 1.6 oz (77.6 kg) 166 lb 11.2 oz (75.6 kg)    Telemetry    Sinus to sinus bradycardia- Personally Reviewed  Physical Exam   GEN: No acute distress.   Neck: No JVD Cardiac: RRR, no murmurs, rubs, or gallops.  Respiratory: Clear to auscultation bilaterally. GI: Soft, nontender, non-distended  MS: No  edema; No deformity. Neuro:  Nonfocal  Psych: Normal affect   Labs    Chemistry Recent Labs Lab 07/06/16 0534  07/07/16 0245 07/08/16 0557 07/09/16 0432  NA 137  < > 135 136 137  K 4.6  < > 3.6 4.0 4.0  CL 103  < > 100* 105 104  CO2 24  < > 26 23 24  GLUCOSE 222*  < > 123* 132* 136*  BUN 17  < > 16 15 11  CREATININE 2.16*  < > 1.82* 1.77* 1.71*  CALCIUM 8.4*  < > 8.1* 8.3* 8.2*  PROT 5.7*  --  5.1* 5.6*  --   ALBUMIN 3.3*  --  2.8* 2.8*  --   AST 30  --  18 24  --   ALT 20  --  15* 16*  --   ALKPHOS 46  --  40 41  --   BILITOT 0.8  --  0.8 0.8  --   GFRNONAA 35*  < > 44* 45* 47*  GFRAA 41*  < > 50* 52* 54*  ANIONGAP 10  < > 9 8 9  < > = values   in this interval not displayed.   Hematology Recent Labs Lab 07/06/16 0534 07/07/16 0245 07/08/16 0557  WBC 13.8* 12.0* 9.1  RBC 4.48 4.04* 4.05*  HGB 12.2* 10.9* 11.3*  HCT 36.3* 32.4* 32.5*  MCV 81.0 80.2 80.2  MCH 27.2 27.0 27.9  MCHC 33.6 33.6 34.8  RDW 13.7 13.3 13.5  PLT 221 175 166    Cardiac Enzymes Recent Labs Lab 07/05/16 0313 07/05/16 0608 07/05/16 0926 07/05/16 2213  TROPONINI <0.03 <0.03 <0.03 <0.03    Recent Labs Lab 07/04/16 2249  TROPIPOC 0.00     Patient Profile     46 y.o. male with diabetes mellitus and history of coronary disease admitted with chest pain. Cardiac catheterization reveals diffuse in-stent restenosis from 99% in the LAD, 60% right coronary artery and ejection fraction 40%. Also with renal insufficiency.  Assessment & Plan    1 Coronary artery disease-planned PCI of LAD today. We will need to follow renal function closely following procedure given baseline renal insufficiency and risk of contrast nephropathy. He has been hydrated prior to the procedure. Continue aspirin, statin, heparin and nitroglycerin. Add coreg 3.125 mg BID.  2 chronic stage III kidney disease-follow renal function closely after procedure.  3 hyperlipidemia-continue statin.  4 ischemic  cardiomyopathy-would consider ARB after procedure when renal function stable.  Signed, Olga Millers, MD  07/09/2016, 7:59 AM

## 2016-07-10 ENCOUNTER — Inpatient Hospital Stay (HOSPITAL_COMMUNITY): Payer: Medicaid Other

## 2016-07-10 LAB — GLUCOSE, CAPILLARY
GLUCOSE-CAPILLARY: 191 mg/dL — AB (ref 65–99)
Glucose-Capillary: 129 mg/dL — ABNORMAL HIGH (ref 65–99)
Glucose-Capillary: 200 mg/dL — ABNORMAL HIGH (ref 65–99)
Glucose-Capillary: 182 mg/dL — ABNORMAL HIGH (ref 65–99)

## 2016-07-10 LAB — CBC
HEMATOCRIT: 35.8 % — AB (ref 39.0–52.0)
HEMOGLOBIN: 11.9 g/dL — AB (ref 13.0–17.0)
MCH: 26.9 pg (ref 26.0–34.0)
MCHC: 33.2 g/dL (ref 30.0–36.0)
MCV: 81 fL (ref 78.0–100.0)
PLATELETS: 234 10*3/uL (ref 150–400)
RBC: 4.42 MIL/uL (ref 4.22–5.81)
RDW: 13.6 % (ref 11.5–15.5)
WBC: 6.8 10*3/uL (ref 4.0–10.5)

## 2016-07-10 LAB — COMPREHENSIVE METABOLIC PANEL
ALT: 19 U/L (ref 17–63)
ANION GAP: 8 (ref 5–15)
AST: 22 U/L (ref 15–41)
Albumin: 3 g/dL — ABNORMAL LOW (ref 3.5–5.0)
Alkaline Phosphatase: 39 U/L (ref 38–126)
BUN: 8 mg/dL (ref 6–20)
CHLORIDE: 105 mmol/L (ref 101–111)
CO2: 23 mmol/L (ref 22–32)
CREATININE: 1.69 mg/dL — AB (ref 0.61–1.24)
Calcium: 8.6 mg/dL — ABNORMAL LOW (ref 8.9–10.3)
GFR, EST AFRICAN AMERICAN: 55 mL/min — AB (ref >60)
GFR, EST NON AFRICAN AMERICAN: 48 mL/min — AB (ref >60)
Glucose, Bld: 124 mg/dL — ABNORMAL HIGH (ref 65–99)
POTASSIUM: 4.2 mmol/L (ref 3.5–5.1)
Sodium: 136 mmol/L (ref 135–145)
Total Bilirubin: 0.5 mg/dL (ref 0.3–1.2)
Total Protein: 5.9 g/dL — ABNORMAL LOW (ref 6.5–8.1)

## 2016-07-10 MED ORDER — CARVEDILOL 3.125 MG PO TABS
3.1250 mg | ORAL_TABLET | Freq: Two times a day (BID) | ORAL | Status: DC
  Administered 2016-07-10 – 2016-07-12 (×3): 3.125 mg via ORAL
  Filled 2016-07-10 (×3): qty 1

## 2016-07-10 MED ORDER — SODIUM CHLORIDE 0.9 % WEIGHT BASED INFUSION
1.0000 mL/kg/h | INTRAVENOUS | Status: DC
  Administered 2016-07-10 (×2): 1 mL/kg/h via INTRAVENOUS

## 2016-07-10 MED FILL — Nitroglycerin IV Soln 100 MCG/ML in D5W: INTRA_ARTERIAL | Qty: 10 | Status: AC

## 2016-07-10 NOTE — Progress Notes (Signed)
CARDIAC REHAB PHASE I   PRE:  Rate/Rhythm: 59 SB  BP:  Supine: 121/67  Sitting:   Standing:    SaO2: 92%RA  MODE:  Ambulation: 450 ft   POST:  Rate/Rhythm: 79 SR  BP:  Supine:   Sitting: 136/85  Standing:    SaO2: 97%RA 0810-0905 Pt walked 450 ft on RA with steady gait. No CP. Tolerated well. To recliner after walk. Pt not sure plan at this time. Reviewed NTG use, heart healthy eating choices watching salt and carbs. Pt has difficulty at times getting the words out that he wants to say. Gave diabetic and heart healthy diets for pt to read. Did not give ex ed as unsure of plan at this time. Cardiologist can address if pt for discharge. Briefly explained CRP 2 to pt. He did not go previously with stent. If another attempt at opening blockage not in plan, cardiologist would need to use diagnosis of stable angina for CRP 2 referral. Pt stated he likes to walk for ex. Will follow up tomorrow if not d/ced.   Luetta Nutting, RN BSN  07/10/2016 9:03 AM

## 2016-07-10 NOTE — Progress Notes (Addendum)
Progress Note  Patient Name: [name] Date of Encounter: 07/10/2016  Primary Cardiologist: Dr Antoine Poche  Subjective   Pt denies CP; no dyspnea; no pain at cath site  Inpatient Medications    Scheduled Meds: . aspirin  81 mg Oral Daily  . atorvastatin  80 mg Oral Daily  . citalopram  20 mg Oral Daily  . clopidogrel  75 mg Oral Q breakfast  . insulin aspart  0-15 Units Subcutaneous TID WC  . insulin aspart  0-5 Units Subcutaneous QHS  . sodium chloride flush  3 mL Intravenous Q12H  . sodium chloride flush  3 mL Intravenous Q12H   Continuous Infusions: . sodium chloride    . sodium chloride    . nitroGLYCERIN 16.667 mcg/min (07/10/16 0600)  . piperacillin-tazobactam (ZOSYN)  IV     PRN Meds: sodium chloride, sodium chloride, acetaminophen, hydrALAZINE, morphine injection, nitroGLYCERIN, ondansetron (ZOFRAN) IV, sodium chloride flush, sodium chloride flush   Vital Signs    Vitals:   07/09/16 1952 07/09/16 2000 07/10/16 0402 07/10/16 0822  BP: 128/86 119/80 129/66 121/67  Pulse: 68 66 (!) 56 (!) 54  Resp: 20 (!) Temp:  97.3 F (36.3 C) 98.1 F (36.7 C) 98.1 F (36.7 C)  TempSrc:  Oral Oral Oral  SpO2: 94% 92% 95% 92%  Weight:   169 lb 12.1 oz (77 kg)   Height:        Intake/Output Summary (Last 24 hours) at 07/10/16 0932 Last data filed at 07/10/16 4098  Gross per 24 hour  Intake          2222.75 ml  Output             2600 ml  Net          -377.25 ml   Filed Weights   07/08/16 0417 07/09/16 0500 07/10/16 0402  Weight: 171 lb 1.6 oz (77.6 kg) 166 lb 11.2 oz (75.6 kg) 169 lb 12.1 oz (77 kg)    Telemetry    Sinus bradycardia- Personally Reviewed  Physical Exam   GEN: No acute distress.   Neck: No JVD Cardiac: RRR Respiratory: Clear to auscultation bilaterally. GI: Soft, nontender, non-distended, + BS MS: No edema; Cath site with no hematoma and no bruit Neuro:  Nonfocal  Psych: Normal affect   Labs    Chemistry  Recent Labs Lab  07/07/16 0245 07/08/16 0557 07/09/16 0432 07/10/16 0355  NA 135 136 137 136  K 3.6 4.0 4.0 4.2  CL 100* 105 104 105  CO2 GLUCOSE 123* 132* 136* 124*  BUN CREATININE 1.82* 1.77* 1.71* 1.69*  CALCIUM 8.1* 8.3* 8.2* 8.6*  PROT 5.1* 5.6*  --  5.9*  ALBUMIN 2.8* 2.8*  --  3.0*  AST 18 24  --  22  ALT 15* 16*  --  19  ALKPHOS 40 41  --  39  BILITOT 0.8 0.8  --  0.5  GFRNONAA 44* 45* 47* 48*  GFRAA 50* 52* 54* 55*  ANIONGAP Hematology  Recent Labs Lab 07/07/16 0245 07/08/16 0557 07/10/16 0355  WBC 12.0* 9.1 6.8  RBC 4.04* 4.05* 4.42  HGB 10.9* 11.3* 11.9*  HCT 32.4* 32.5* 35.8*  MCV 80.2 80.2 81.0  MCH 27.0 27.9 26.9  MCHC 33.6 34.8 33.2  RDW 13.3 13.5 13.6  PLT 175 166 234    Cardiac Enzymes  Recent Labs Lab  07/05/16 0313 07/05/16 0608 07/05/16 0926 07/05/16 2213  TROPONINI <0.03 <0.03 <0.03 <0.03     Recent Labs Lab 07/04/16 2249  TROPIPOC 0.00     Patient Profile     46 y.o. male with diabetes mellitus and history of coronary disease admitted with chest pain. Cardiac catheterization reveals diffuse in-stent restenosis from 99% in the LAD, 60% right coronary artery and ejection fraction 40%. Also with renal insufficiency.  Assessment & Plan    1 Coronary artery disease-second attempt at PCI of LAD yesterday unsuccessful. I have discussed the patient with Dr. Allyson Sabal. Will review with Dr. [place] as well for possible CTO intervention in AM if renal function stable (recheck BMET in AM0. ? CABG if unsuccessful. We will need to follow renal function closely following given baseline renal insufficiency and risk of contrast nephropathy. Continue aspirin, statin, heparin and nitroglycerin. Add coreg 3.125 mg BID.  2 chronic stage III kidney disease-follow renal function closely.  3 hyperlipidemia-continue statin.  4 ischemic cardiomyopathy-would consider ARB after procedure when renal function stable.  Signed, Olga Millers, MD  07/10/2016, 9:32 AM

## 2016-07-10 NOTE — Progress Notes (Addendum)
[facility] PROGRESS NOTE  Ryler Laskowski ZOX:[id number] DOB: 02-22-1971 DOA: 07/04/2016   PCP: Samuel Jester, DO     Assessment/Plan: Principal Problem:   CORONARY ATHEROSCLEROSIS NATIVE CORONARY ARTERY Active Problems:   Type 2 diabetes mellitus, uncontrolled (HCC)   Essential hypertension   Chest pain   Acute renal failure superimposed on stage 3 chronic kidney disease (HCC)   46 y.o.malewith CAD (anterior MI s/p thrombectomy/BMS to mLAD 01/2009, ICM, DM, HTN, HLD, probable CKD II (Cr baseline 1.2-1.4), medication noncompliance, remote alcohol abuse admitted for chest pain. Patient underwent cardiac cath on 4/12. Found to have  in-stent restenosis, creatinine increase post cath. Subsequently patient spiked a fever on 4/14. Multiple ongoing issues, not stable for discharge  Assessment and plan Chest pain-Coronary artery disease / - cardiac enzymes negative - given risk factors  cardiology was consulted, appreciate cardiology recommendations. Cardiac cath showed Diffuse in-stent restenosis, 99%, of the stent placed in the mid LAD Distal anterior wall and apical akinesis. LVEF 40%.   . 2-D echo -EF 40-45% Continue Aspirin 81 mg /heparin/nitroglycerin drip. Patient scheduled to have LAD stent 4/16 ,   postponed until Monday secondary to acute kidney injury. Hold off on beta blocker as heart rate is in the 60s.. -second attempt at PCI of LAD 4/16  unsuccessful. As per Dr Jens Som    possible CTO intervention in AM if renal function stable   Fever likely secondary to pneumonia-post cath Started on  Zosyn 4/14 , follow blood cultures-ngsf Check UA, chest x-ray suspicious for possible aspiration Vancomycin not initiated secondary to acute kidney injury Fever curve improving    CORONARY ATHEROSCLEROSIS NATIVE CORONARY ARTERY - Continue aspirin and statin   Essential hypertension -  blood pressure stable monitor   Type 2 diabetes mellitus, uncontrolled (HCC) - hold PO meds  order sliding scale. Hemoglobin A1c 8.9  Acute on chronic CK D stage II-baseline creatinine around 1.4, creatinine greater than 2 post cath, now 1.82>1.7>1.69, continue to follow renal function after PCI   Slurred speech  CT head to r/o prior CVA  Patient states that he has slurred since he was a child   DVT prophylaxsis heparin drip  Code Status:  Full code    Family Communication: Discussed in detail with the patient, all imaging results, lab results explained to the patient   Disposition Plan:  Anticipate discharge 1-2, multiple reasons to delay repeat cardiac cath      Consultants:  Cardiology  Procedures:  Cardiac cath  Antibiotics: Anti-infectives    Start     Dose/Rate Route Frequency Ordered Stop   07/07/16 1400  piperacillin-tazobactam (ZOSYN) IVPB 3.375 g  Status:  Discontinued     3.375 g 12.5 mL/hr over 240 Minutes Intravenous Every 8 hours 07/07/16 0950 07/07/16 0951   07/07/16 1000  piperacillin-tazobactam (ZOSYN) IVPB 3.375 g     3.375 g 12.5 mL/hr over 240 Minutes Intravenous Every 8 hours 07/07/16 0951     07/06/16 2200  cefTRIAXone (ROCEPHIN) 1 g in dextrose 5 % 50 mL IVPB  Status:  Discontinued     1 g 100 mL/hr over 30 Minutes Intravenous Every 24 hours 07/06/16 2103 07/07/16 0910         HPI/Subjective: Denies any cough, chest pain, shortness of breath  Objective: Vitals:   07/09/16 2000 07/10/16 0402 07/10/16 0822 07/10/16 1100  BP: 119/80 129/66 121/67 122/75  Pulse: 66 (!) 56 (!) 54 66  Resp: (!) (!) 23  Temp: 97.3 F (36.3 C) 98.1 F (36.7 C) 98.1 F (36.7 C) 97.7 F (36.5 C)  TempSrc: Oral Oral Oral Oral  SpO2: 92% 95% 92% 97%  Weight:  77 kg (169 lb 12.1 oz)    Height:        Intake/Output Summary (Last 24 hours) at 07/10/16 1249 Last data filed at 07/10/16 1200  Gross per 24 hour  Intake          2286.55 ml  Output             3250 ml  Net          -963.45 ml    Exam:  Examination:  General exam:  Appears calm and comfortable  Respiratory system: Clear to auscultation. Respiratory effort normal. Cardiovascular system: S1 & S2 heard, RRR. No JVD, murmurs, rubs, gallops or clicks. No pedal edema. Gastrointestinal system: Abdomen is nondistended, soft and nontender. No organomegaly or masses felt. Normal bowel sounds heard. Central nervous system: Alert and oriented. No focal neurological deficits. Extremities: Symmetric 5 x 5 power. Skin: No rashes, lesions or ulcers Psychiatry: Judgement and insight appear normal. Mood & affect appropriate.     Data Reviewed: I have personally reviewed following labs and imaging studies  Micro Results Recent Results (from the past 240 hour(s))  Culture, blood (routine x 2)     Status: None (Preliminary result)   Collection Time: 07/06/16  9:52 PM  Result Value Ref Range Status   Specimen Description BLOOD BLOOD LEFT HAND  Final   Special Requests   Final    BOTTLES DRAWN AEROBIC AND ANAEROBIC Blood Culture results may not be optimal due to an excessive volume of blood received in culture bottles   Culture NO GROWTH 4 DAYS  Final   Report Status PENDING  Incomplete  Culture, blood (routine x 2)     Status: None (Preliminary result)   Collection Time: 07/06/16 10:00 PM  Result Value Ref Range Status   Specimen Description BLOOD BLOOD RIGHT HAND  Final   Special Requests   Final    BOTTLES DRAWN AEROBIC AND ANAEROBIC Blood Culture results may not be optimal due to an excessive volume of blood received in culture bottles   Culture NO GROWTH 4 DAYS  Final   Report Status PENDING  Incomplete    Radiology Reports Dg Chest 2 View  Result Date: 07/04/2016 CLINICAL DATA:  Chest pain with dyspnea radiating across a chest EXAM: CHEST  2 VIEW COMPARISON:  11/07/2014 FINDINGS: The heart size and mediastinal contours are within normal limits. Both lungs are clear. The visualized skeletal structures are unremarkable. IMPRESSION: No active cardiopulmonary  disease. Electronically Signed   By: Tollie Eth M.D.   On: 07/04/2016 23:05   Dg Chest Port 1 View  Result Date: 07/06/2016 CLINICAL DATA:  Fever EXAM: PORTABLE CHEST 1 VIEW COMPARISON:  07/04/2016 chest radiograph. FINDINGS: Low lung volumes. Stable cardiomediastinal silhouette with top-normal heart size. No pneumothorax. No pleural effusion. Patchy bibasilar lung opacities appear new. IMPRESSION: Low lung volumes with new patchy bibasilar lung opacities, which could represent atelectasis, aspiration and/ or pneumonia. Follow-up PA and lateral chest radiographs are warranted. Electronically Signed   By: Delbert Phenix M.D.   On: 07/06/2016 21:58     CBC  Recent Labs Lab 07/04/16 2220 07/06/16 0534 07/07/16 0245 07/08/16 0557 07/10/16 0355  WBC 8.5 13.8* 12.0* 9.1 6.8  HGB 14.3 12.2* 10.9* 11.3* 11.9*  HCT 42.3 36.3* 32.4* 32.5* 35.8*  PLT 242  221 175 166 234  MCV 80.1 81.0 80.2 80.2 81.0  MCH 27.1 27.2 27.0 27.9 26.9  MCHC 33.8 33.6 33.6 34.8 33.2  RDW 13.1 13.7 13.3 13.5 13.6    Chemistries   Recent Labs Lab 07/06/16 0534  07/06/16 1259 07/07/16 0245 07/08/16 0557 07/09/16 0432 07/10/16 0355  NA 137  < > 139 135 136 137 136  K 4.6  < > 4.2 3.6 4.0 4.0 4.2  CL 103  < > 100* 100* 105 104 105  CO2 24  < > GLUCOSE 222*  < > 173* 123* 132* 136* 124*  BUN 17  < > CREATININE 2.16*  < > 2.03* 1.82* 1.77* 1.71* 1.69*  CALCIUM 8.4*  < > 8.7* 8.1* 8.3* 8.2* 8.6*  AST 30  --   --  18 24  --  22  ALT 20  --   --  15* 16*  --  19  ALKPHOS 46  --   --  40 41  --  39  BILITOT 0.8  --   --  0.8 0.8  --  0.5  < > = values in this interval not displayed. ------------------------------------------------------------------------------------------------------------------ estimated creatinine clearance is 55.8 mL/min (A) (by C-G formula based on SCr of 1.69 mg/dL  (H)). ------------------------------------------------------------------------------------------------------------------ No results for input(s): HGBA1C in the last 72 hours. ------------------------------------------------------------------------------------------------------------------ No results for input(s): CHOL, HDL, LDLCALC, TRIG, CHOLHDL, LDLDIRECT in the last 72 hours. ------------------------------------------------------------------------------------------------------------------ No results for input(s): TSH, T4TOTAL, T3FREE, THYROIDAB in the last 72 hours.  Invalid input(s): FREET3 ------------------------------------------------------------------------------------------------------------------ No results for input(s): VITAMINB12, FOLATE, FERRITIN, TIBC, IRON, RETICCTPCT in the last 72 hours.  Coagulation profile  Recent Labs Lab 07/05/16 0926  INR 1.14    No results for input(s): DDIMER in the last 72 hours.  Cardiac Enzymes  Recent Labs Lab 07/05/16 0608 07/05/16 0926 07/05/16 2213  TROPONINI <0.03 <0.03 <0.03   ------------------------------------------------------------------------------------------------------------------ Invalid input(s): POCBNP   CBG:  Recent Labs Lab 07/09/16 1332 07/09/16 1659 07/09/16 2107 07/10/16 0624 07/10/16 1119  GLUCAP 118* 237* 218* 129* 200*       Studies: No results found.    Lab Results  Component Value Date   HGBA1C 8.9 (H) 07/05/2016   HGBA1C (H) 05/04/2010    9.3 (NOTE)                                                                       According to the ADA Clinical Practice Recommendations for 2011, when HbA1c is used as a screening test:   >=6.5%   Diagnostic of Diabetes Mellitus           (if abnormal result  is confirmed)  5.7-6.4%   Increased risk of developing Diabetes Mellitus  References:Diagnosis and Classification of Diabetes Mellitus,Diabetes Care,2011,34(Suppl 1):S62-S69 and Standards of  Medical Care in         Diabetes - 2011,Diabetes Care,2011,34  (Suppl 1):S11-S61.   HGBA1C (H) 02/01/2009    9.0 (NOTE) The ADA recommends the following therapeutic goal for glycemic control related to Hgb A1c measurement: Goal of therapy: <6.5 Hgb A1c  Reference: American Diabetes Association: Clinical Practice Recommendations 2010, Diabetes Care, 2010, 33: (Suppl  1).  Lab Results  Component Value Date   LDLCALC 42 07/06/2016   CREATININE 1.69 (H) 07/10/2016       Scheduled Meds: . aspirin  81 mg Oral Daily  . atorvastatin  80 mg Oral Daily  . carvedilol  3.125 mg Oral BID WC  . citalopram  20 mg Oral Daily  . clopidogrel  75 mg Oral Q breakfast  . insulin aspart  0-15 Units Subcutaneous TID WC  . insulin aspart  0-5 Units Subcutaneous QHS  . sodium chloride flush  3 mL Intravenous Q12H  . sodium chloride flush  3 mL Intravenous Q12H   Continuous Infusions: . sodium chloride    . sodium chloride    . nitroGLYCERIN 10 mcg/min (07/10/16 1218)  . piperacillin-tazobactam (ZOSYN)  IV       LOS: 5 days    Time spent: >30 MINS    Richarda Overlie  [facility] Pager [phone]. If 7PM-7AM, please contact night-coverage at [email or web address], password [facility] 07/10/2016, 12:49 PM  LOS: 5 days

## 2016-07-10 NOTE — Progress Notes (Signed)
Inpatient Diabetes Program Recommendations  AACE/ADA: New Consensus Statement on Inpatient Glycemic Control (2015)  Target Ranges:  Prepandial:   less than 140 mg/dL      Peak postprandial:   less than 180 mg/dL (1-2 hours)      Critically ill patients:  140 - 180 mg/dL   Lab Results  Component Value Date   GLUCAP 200 (H) 07/10/2016   HGBA1C 8.9 (H) 07/05/2016    Review of Glycemic ControlResults for AYCEN, PORRECA (MRN [id number]) as of 07/10/2016 13:08  Ref. Range 07/09/2016 07:43 07/09/2016 13:32 07/09/2016 16:59 07/09/2016 21:07 07/10/2016 06:24 07/10/2016 11:19  Glucose-Capillary Latest Ref Range: 65 - 99 mg/dL 409 (H) 811 (H) 914 (H) 218 (H) 129 (H) 200 (H)   Current orders for Inpatient glycemic control:  May consider adding Novolog meal coverage 3 units tid with meals (hold if patient eats less than 50%).  Thanks, Beryl Meager, RN, BC-ADM Inpatient Diabetes Coordinator Pager [phone] (8a-5p)   I

## 2016-07-10 NOTE — Care Management Note (Addendum)
Case Management Note  Patient Details  Name: [name] MRN: [id number] Date of Birth: [birth year]  Subjective/Objective:    s/p stent intervention (unsuccessful), will re cath tomorrow , will be on plavix, NCM will follow for dc needs.                Action/Plan:   Expected Discharge Date:                  Expected Discharge Plan:  Home/Self Care  In-House Referral:     Discharge planning Services  CM Consult  Post Acute Care Choice:    Choice offered to:     DME Arranged:    DME Agency:     HH Arranged:    HH Agency:     Status of Service:  Completed, signed off  If discussed at [employer] of Stay Meetings, dates discussed:    Additional Comments:  Leone Haven, RN 07/10/2016, 9:22 AM

## 2016-07-11 ENCOUNTER — Encounter (HOSPITAL_COMMUNITY): Payer: Self-pay | Admitting: Cardiology

## 2016-07-11 ENCOUNTER — Encounter (HOSPITAL_COMMUNITY)
Admission: EM | Disposition: A | Discharge status: Home / Self Care | Payer: Self-pay | Source: Home / Self Care | Attending: Internal Medicine

## 2016-07-11 DIAGNOSIS — N183 Chronic kidney disease, stage 3 unspecified: Secondary | ICD-10-CM

## 2016-07-11 DIAGNOSIS — I2582 Chronic total occlusion of coronary artery: Secondary | ICD-10-CM

## 2016-07-11 DIAGNOSIS — R509 Fever, unspecified: Secondary | ICD-10-CM

## 2016-07-11 DIAGNOSIS — I200 Unstable angina: Secondary | ICD-10-CM

## 2016-07-11 DIAGNOSIS — I25110 Atherosclerotic heart disease of native coronary artery with unstable angina pectoris: Secondary | ICD-10-CM

## 2016-07-11 HISTORY — PX: CORONARY CTO INTERVENTION: CATH118236

## 2016-07-11 LAB — CULTURE, BLOOD (ROUTINE X 2)
CULTURE: NO GROWTH
Culture: NO GROWTH

## 2016-07-11 LAB — CBC
HCT: 35 % — ABNORMAL LOW (ref 39.0–52.0)
HEMOGLOBIN: 11.7 g/dL — AB (ref 13.0–17.0)
MCH: 26.8 pg (ref 26.0–34.0)
MCHC: 33.4 g/dL (ref 30.0–36.0)
MCV: 80.1 fL (ref 78.0–100.0)
Platelets: 233 10*3/uL (ref 150–400)
RBC: 4.37 MIL/uL (ref 4.22–5.81)
RDW: 13.6 % (ref 11.5–15.5)
WBC: 8.1 10*3/uL (ref 4.0–10.5)

## 2016-07-11 LAB — POCT ACTIVATED CLOTTING TIME
ACTIVATED CLOTTING TIME: 499 s
Activated Clotting Time: 208 seconds
Activated Clotting Time: 621 seconds
Activated Clotting Time: 169 seconds

## 2016-07-11 LAB — COMPREHENSIVE METABOLIC PANEL
ALK PHOS: 38 U/L (ref 38–126)
ALT: 21 U/L (ref 17–63)
ANION GAP: 7 (ref 5–15)
AST: 25 U/L (ref 15–41)
Albumin: 3.1 g/dL — ABNORMAL LOW (ref 3.5–5.0)
BUN: 7 mg/dL (ref 6–20)
CALCIUM: 8.7 mg/dL — AB (ref 8.9–10.3)
CO2: 22 mmol/L (ref 22–32)
Chloride: 107 mmol/L (ref 101–111)
Creatinine, Ser: 1.52 mg/dL — ABNORMAL HIGH (ref 0.61–1.24)
GFR, EST NON AFRICAN AMERICAN: 54 mL/min — AB (ref >60)
Glucose, Bld: 146 mg/dL — ABNORMAL HIGH (ref 65–99)
Potassium: 4 mmol/L (ref 3.5–5.1)
SODIUM: 136 mmol/L (ref 135–145)
Total Bilirubin: 0.5 mg/dL (ref 0.3–1.2)
Total Protein: 6 g/dL — ABNORMAL LOW (ref 6.5–8.1)

## 2016-07-11 LAB — GLUCOSE, CAPILLARY
GLUCOSE-CAPILLARY: 129 mg/dL — AB (ref 65–99)
GLUCOSE-CAPILLARY: 288 mg/dL — AB (ref 65–99)
Glucose-Capillary: 95 mg/dL (ref 65–99)
Glucose-Capillary: 88 mg/dL (ref 65–99)

## 2016-07-11 SURGERY — CORONARY CTO INTERVENTION
Anesthesia: LOCAL

## 2016-07-11 MED ORDER — NITROGLYCERIN 1 MG/10 ML FOR IR/CATH LAB
INTRA_ARTERIAL | Status: AC
  Filled 2016-07-11: qty 10

## 2016-07-11 MED ORDER — SODIUM CHLORIDE 0.9% FLUSH: 3.0000 mL | Freq: Two times a day (BID) | INTRAVENOUS | Status: DC

## 2016-07-11 MED ORDER — SODIUM CHLORIDE 0.9 % WEIGHT BASED INFUSION
1.0000 mL/kg/h | INTRAVENOUS | Status: AC
  Administered 2016-07-11: 16:00:00 1 mL/kg/h via INTRAVENOUS

## 2016-07-11 MED ORDER — MIDAZOLAM HCL 2 MG/2ML IJ SOLN
INTRAMUSCULAR | Status: AC
  Filled 2016-07-11: qty 2

## 2016-07-11 MED ORDER — IOPAMIDOL (ISOVUE-370) INJECTION 76%
INTRAVENOUS | Status: AC
  Filled 2016-07-11: qty 50

## 2016-07-11 MED ORDER — SODIUM CHLORIDE 0.9% FLUSH: 3.0000 mL | INTRAVENOUS | Status: DC | PRN

## 2016-07-11 MED ORDER — LIDOCAINE HCL (PF) 1 % IJ SOLN
INTRAMUSCULAR | Status: DC | PRN
Start: 2016-07-11 — End: 2016-07-11
  Administered 2016-07-11: 15 mL

## 2016-07-11 MED ORDER — HEPARIN SODIUM (PORCINE) 1000 UNIT/ML IJ SOLN
INTRAMUSCULAR | Status: AC
  Filled 2016-07-11: qty 1

## 2016-07-11 MED ORDER — IOPAMIDOL (ISOVUE-370) INJECTION 76%
INTRAVENOUS | Status: DC | PRN
  Administered 2016-07-11: 115 mL via INTRA_ARTERIAL

## 2016-07-11 MED ORDER — SODIUM CHLORIDE 0.9 % IV SOLN: 250.0000 mL | INTRAVENOUS | Status: DC | PRN

## 2016-07-11 MED ORDER — HEPARIN SODIUM (PORCINE) 5000 UNIT/ML IJ SOLN
5000.0000 [IU] | Freq: Three times a day (TID) | INTRAMUSCULAR | Status: DC
  Administered 2016-07-12: 5000 [IU] via SUBCUTANEOUS
  Filled 2016-07-11: qty 1

## 2016-07-11 MED ORDER — IOPAMIDOL (ISOVUE-370) INJECTION 76%
INTRAVENOUS | Status: AC
  Filled 2016-07-11: qty 125

## 2016-07-11 MED ORDER — HEPARIN SODIUM (PORCINE) 1000 UNIT/ML IJ SOLN
INTRAMUSCULAR | Status: DC | PRN
  Administered 2016-07-11: 7000 [IU] via INTRAVENOUS

## 2016-07-11 MED ORDER — AMOXICILLIN-POT CLAVULANATE 875-125 MG PO TABS
1.0000 | ORAL_TABLET | Freq: Two times a day (BID) | ORAL | Status: DC
  Administered 2016-07-11 – 2016-07-12 (×2): 1 via ORAL
  Filled 2016-07-11 (×2): qty 1

## 2016-07-11 MED ORDER — ASPIRIN 81 MG PO CHEW
81.0000 mg | CHEWABLE_TABLET | ORAL | Status: AC
  Administered 2016-07-11: 81 mg via ORAL
  Filled 2016-07-11: qty 1

## 2016-07-11 MED ORDER — HEPARIN (PORCINE) IN NACL 2-0.9 UNIT/ML-% IJ SOLN
INTRAMUSCULAR | Status: AC
  Filled 2016-07-11: qty 1000

## 2016-07-11 MED ORDER — LIDOCAINE HCL (PF) 1 % IJ SOLN
INTRAMUSCULAR | Status: AC
  Filled 2016-07-11: qty 30

## 2016-07-11 MED ORDER — FENTANYL CITRATE (PF) 100 MCG/2ML IJ SOLN
INTRAMUSCULAR | Status: DC | PRN
Start: 2016-07-11 — End: 2016-07-11
  Administered 2016-07-11: 25 ug via INTRAVENOUS

## 2016-07-11 MED ORDER — MIDAZOLAM HCL 2 MG/2ML IJ SOLN
INTRAMUSCULAR | Status: DC | PRN
  Administered 2016-07-11: 2 mg via INTRAVENOUS

## 2016-07-11 MED ORDER — FENTANYL CITRATE (PF) 100 MCG/2ML IJ SOLN
INTRAMUSCULAR | Status: AC
  Filled 2016-07-11: qty 2

## 2016-07-11 MED ORDER — HEPARIN (PORCINE) IN NACL 2-0.9 UNIT/ML-% IJ SOLN
INTRAMUSCULAR | Status: DC | PRN
Start: 2016-07-11 — End: 2016-07-11
  Administered 2016-07-11: 1000 mL

## 2016-07-11 MED ORDER — SODIUM CHLORIDE 0.9% FLUSH
3.0000 mL | Freq: Two times a day (BID) | INTRAVENOUS | Status: DC
  Administered 2016-07-11 – 2016-07-12 (×2): 3 mL via INTRAVENOUS

## 2016-07-11 MED ORDER — HEPARIN SODIUM (PORCINE) 5000 UNIT/ML IJ SOLN: 5000.0000 [IU] | Freq: Three times a day (TID) | INTRAMUSCULAR | Status: DC

## 2016-07-11 MED ORDER — ASPIRIN 81 MG PO CHEW
81.0000 mg | CHEWABLE_TABLET | Freq: Every day | ORAL | Status: DC
  Administered 2016-07-12: 81 mg via ORAL
  Filled 2016-07-11: qty 1

## 2016-07-11 MED ORDER — NITROGLYCERIN 1 MG/10 ML FOR IR/CATH LAB
INTRA_ARTERIAL | Status: DC | PRN
  Administered 2016-07-11: 200 ug via INTRACORONARY

## 2016-07-11 SURGICAL SUPPLY — 19 items
BALLN MOZEC 2.50X20 (BALLOONS) ×2
BALLN ~~LOC~~ MOZEC 3.0X15 (BALLOONS) ×2
BALLOON MOZEC 2.50X20 (BALLOONS) IMPLANT
BALLOON ~~LOC~~ MOZEC 3.0X15 (BALLOONS) IMPLANT
CATH CROSSBOSS CTO (CATHETERS) ×1 IMPLANT
CATH MACH1 8FR CLS3.5 (CATHETERS) ×2 IMPLANT
CATH TRAPPER 6-8F (CATHETERS) ×1 IMPLANT
KIT ENCORE 26 ADVANTAGE (KITS) ×3 IMPLANT
KIT HEART LEFT (KITS) ×2 IMPLANT
PACK CARDIAC CATHETERIZATION (CUSTOM PROCEDURE TRAY) ×2 IMPLANT
SHEATH BRITE TIP 8FR 35CM (SHEATH) ×1 IMPLANT
SHEATH PINNACLE 8F 10CM (SHEATH) ×1 IMPLANT
STENT SYNERGY DES 2.5X38 (Permanent Stent) ×1 IMPLANT
TRANSDUCER W/STOPCOCK (MISCELLANEOUS) ×2 IMPLANT
TUBING CIL FLEX 10 FLL-RA (TUBING) ×2 IMPLANT
VALVE GUARDIAN II ~~LOC~~ HEMO (MISCELLANEOUS) ×1 IMPLANT
WIRE ASAHI PROWATER 180CM (WIRE) ×1 IMPLANT
WIRE EMERALD 3MM-J .035X150CM (WIRE) ×1 IMPLANT
WIRE FIGHTER CROSSING 190CM (WIRE) ×1 IMPLANT

## 2016-07-11 NOTE — H&P (View-Only) (Signed)
Progress Note  Patient Name: [name] Date of Encounter: 07/11/2016  Primary Cardiologist: Dr. Daiva Nakayama   Subjective   No chest pain, sitting up in chair.   Inpatient Medications    Scheduled Meds: . [START ON 07/12/2016] aspirin  81 mg Oral Daily  . atorvastatin  80 mg Oral Daily  . carvedilol  3.125 mg Oral BID WC  . citalopram  20 mg Oral Daily  . clopidogrel  75 mg Oral Q breakfast  . insulin aspart  0-15 Units Subcutaneous TID WC  . insulin aspart  0-5 Units Subcutaneous QHS  . sodium chloride flush  3 mL Intravenous Q12H  . sodium chloride flush  3 mL Intravenous Q12H   Continuous Infusions: . sodium chloride    . sodium chloride    . sodium chloride 1 mL/kg/hr (07/10/16 2300)  . nitroGLYCERIN 10 mcg/min (07/10/16 1800)  . piperacillin-tazobactam (ZOSYN)  IV     PRN Meds: sodium chloride, sodium chloride, acetaminophen, hydrALAZINE, morphine injection, nitroGLYCERIN, ondansetron (ZOFRAN) IV, sodium chloride flush, sodium chloride flush   Vital Signs    Vitals:   07/10/16 1100 07/10/16 1530 07/10/16 2200 07/11/16 0430  BP: 122/75 121/89 (!) 143/84 127/75  Pulse: 66 (!) 55 (!) 59 (!) 58  Resp: (!) 23 (!) 21 20 (!) 21  Temp: 97.7 F (36.5 C) 97.8 F (36.6 C) 98.2 F (36.8 C) 97.8 F (36.6 C)  TempSrc: Oral Oral Oral Oral  SpO2: 97% 96% 94% 95%  Weight:    171 lb 15.3 oz (78 kg)  Height:        Intake/Output Summary (Last 24 hours) at 07/11/16 0906 Last data filed at 07/11/16 0430  Gross per 24 hour  Intake             1248 ml  Output             2650 ml  Net            -1402 ml   Filed Weights   07/09/16 0500 07/10/16 0402 07/11/16 0430  Weight: 166 lb 11.2 oz (75.6 kg) 169 lb 12.1 oz (77 kg) 171 lb 15.3 oz (78 kg)    Telemetry    SR - Personally Reviewed  ECG    07/10/16  SB at 53 T wave inversions inf lat. leads similar to previous.- Personally Reviewed  Physical Exam   GEN: No acute distress.   Neck: No JVD, no bruits Cardiac:  RRR, no murmurs Respiratory: Clear to auscultation bilaterally. GI: Soft, nontender, non-distended  MS: No edema Neuro:  Nonfocal  Psych: Normal affect   Labs    Chemistry  Recent Labs Lab 07/08/16 0557 07/09/16 0432 07/10/16 0355 07/11/16 0216  NA 136 137 136 136  K 4.0 4.0 4.2 4.0  CL 105 104 105 107  CO2 GLUCOSE 132* 136* 124* 146*  BUN CREATININE 1.77* 1.71* 1.69* 1.52*  CALCIUM 8.3* 8.2* 8.6* 8.7*  PROT 5.6*  --  5.9* 6.0*  ALBUMIN 2.8*  --  3.0* 3.1*  AST 24  --  22 25  ALT 16*  --  19 21  ALKPHOS 41  --  39 38  BILITOT 0.8  --  0.5 0.5  GFRNONAA 45* 47* 48* 54*  GFRAA 52* 54* 55* >60  ANIONGAP Hematology  Recent Labs Lab 07/08/16 0557 07/10/16 0355 07/11/16 0216  WBC 9.1 6.8 8.1  RBC 4.05* 4.42 4.37  HGB 11.3* 11.9* 11.7*  HCT 32.5* 35.8* 35.0*  MCV 80.2 81.0 80.1  MCH 27.9 26.9 26.8  MCHC 34.8 33.2 33.4  RDW 13.5 13.6 13.6  PLT 166 234 233    Cardiac Enzymes  Recent Labs Lab 07/05/16 0313 07/05/16 0608 07/05/16 0926 07/05/16 2213  TROPONINI <0.03 <0.03 <0.03 <0.03     Recent Labs Lab 07/04/16 2249  TROPIPOC 0.00      Radiology    Ct Head Wo Contrast  Result Date: 07/10/2016 CLINICAL DATA:  46 year old diabetic hypertensive male with history of alcohol abuse and chronic kidney disease presenting with sudden onset of slurred speech. Initial encounter. EXAM: CT HEAD WITHOUT CONTRAST TECHNIQUE: Contiguous axial images were obtained from the base of the skull through the vertex without intravenous contrast. COMPARISON:  None. FINDINGS: Brain: No intracranial hemorrhage or CT evidence of large acute infarct. No intracranial mass lesion noted on this unenhanced exam. Vascular: No hyperdense vessel. Skull: No acute abnormality. Sinuses/Orbits: No acute orbital abnormality. Visualized paranasal sinuses are clear. Other: Clear mastoid air cells and middle ear cavities. IMPRESSION: No intracranial hemorrhage  or CT evidence of large acute infarct. Electronically Signed   By: Lacy Duverney M.D.   On: 07/10/2016 13:53    Cardiac Studies   Cardiac caths  07/05/16 Procedures   Left Heart Cath and Coronary Angiography  Conclusion     Mid RCA lesion, 60 %stenosed.  Mid LAD to Dist LAD lesion, 99 %stenosed.  The left ventricular ejection fraction is 35-45% by visual estimate.    Diffuse in-stent restenosis, 99%, of the stent placed in the mid LAD beyond the second diagonal and first septal perforator. The has features that suggest total occlusion.   50-60% mid RCA stenosis.  Small, widely patent circumflex system.  Distal anterior wall and apical akinesis. LVEF 40%  RECOMMENDATIONS:   Unable to safely attempt PCI on the LAD due to difficulty with guide catheter engagement and stability.  Consider PCI from the femoral approach in a.m. versus revascularization via the CTO team. Will review images. Tentatively scheduled for right femoral PCI tomorrow. I have not yet given dual antiplatelet therapy.     07/09/16   IMPRESSION: Unsuccessful attempt at proximal LAD PCI and re-intervention of a stent ISR CTO for unstable angina. We will discontinue the heparin, removed the sheath and hold pressure and hydrate the patient. He remains on total of 5 therapy. I will review his angiogram since my colleagues perform CTO interventions. The patient could have his diagonal branch ostium stented.  Patient Profile     46 y.o. male with diabetes mellitus and history of coronary disease admitted with chest pain. Cardiac catheterization reveals diffuse in-stent restenosis from 99% in the LAD, 60% right coronary artery and ejection fraction 40%. Also with renal insufficiency.   Assessment & Plan    CAD with unsuccessful attempt at PCI LAD.    For CTO intervention today.  -- if unsuccessful possible CABG.  On ASA, statin, heparin and NTG, plavix.   Also 50-60% RCA stentosis  CK -3, with Cr today  1.52 down from 1.69  Hyperlipidemia  Continue statin  ICM EF  40-45% by Echo on the 14th  Bradycardia on BB coreg 3.125 BID   Diabetes type 2  Glucose 129 -200 on SSI  hgb A 1C 8.9  - on glucotrol, actos, glucophage and januvia as outpt.   Fever likely due to PNA HCAP on Zosyn  Day 5,  CXR suspicious  for possible aspiration.  afebrile.  Recheck 2 view CXR in AM.   -? Ask IM to see again for recommendations.    Signed, Nada Boozer, NP  07/11/2016, 9:06 AM    As above, patient seen and examined. Patient denies chest pain or dyspnea. His creatinine today is 1.52 which is decreased from yesterday. Plan for CTO intervention by Dr. [place] and Dr Eldridge Dace today. Will need close follow-up of renal function after procedure given risk of contrast nephropathy. Continue present medications. Would consider an ARB after procedure once renal function stable. Question pneumonia being managed by primary care.  Olga Millers, MD

## 2016-07-11 NOTE — Progress Notes (Signed)
SLP Cancellation Note  Patient Details Name: Zymere Patlan MRN: [id number] DOB: 01-26-1971   Cancelled treatment: Per RN, pt is NPO for procedure today; unable to perform bedside swallow evaluation. ST will continue efforts next possible date.          Macarthur Critchley , Student-SLP 07/11/2016, 10:29 AM

## 2016-07-11 NOTE — Progress Notes (Signed)
Progress Note  Patient Name: [name] Date of Encounter: 07/11/2016  Primary Cardiologist: Dr. Daiva Nakayama   Subjective   No chest pain, sitting up in chair.   Inpatient Medications    Scheduled Meds: . [START ON 07/12/2016] aspirin  81 mg Oral Daily  . atorvastatin  80 mg Oral Daily  . carvedilol  3.125 mg Oral BID WC  . citalopram  20 mg Oral Daily  . clopidogrel  75 mg Oral Q breakfast  . insulin aspart  0-15 Units Subcutaneous TID WC  . insulin aspart  0-5 Units Subcutaneous QHS  . sodium chloride flush  3 mL Intravenous Q12H  . sodium chloride flush  3 mL Intravenous Q12H   Continuous Infusions: . sodium chloride    . sodium chloride    . sodium chloride 1 mL/kg/hr (07/10/16 2300)  . nitroGLYCERIN 10 mcg/min (07/10/16 1800)  . piperacillin-tazobactam (ZOSYN)  IV     PRN Meds: sodium chloride, sodium chloride, acetaminophen, hydrALAZINE, morphine injection, nitroGLYCERIN, ondansetron (ZOFRAN) IV, sodium chloride flush, sodium chloride flush   Vital Signs    Vitals:   07/10/16 1100 07/10/16 1530 07/10/16 2200 07/11/16 0430  BP: 122/75 121/89 (!) 143/84 127/75  Pulse: 66 (!) 55 (!) 59 (!) 58  Resp: (!) 23 (!) 21 20 (!) 21  Temp: 97.7 F (36.5 C) 97.8 F (36.6 C) 98.2 F (36.8 C) 97.8 F (36.6 C)  TempSrc: Oral Oral Oral Oral  SpO2: 97% 96% 94% 95%  Weight:    171 lb 15.3 oz (78 kg)  Height:        Intake/Output Summary (Last 24 hours) at 07/11/16 0906 Last data filed at 07/11/16 0430  Gross per 24 hour  Intake             1248 ml  Output             2650 ml  Net            -1402 ml   Filed Weights   07/09/16 0500 07/10/16 0402 07/11/16 0430  Weight: 166 lb 11.2 oz (75.6 kg) 169 lb 12.1 oz (77 kg) 171 lb 15.3 oz (78 kg)    Telemetry    SR - Personally Reviewed  ECG    07/10/16  SB at 53 T wave inversions inf lat. leads similar to previous.- Personally Reviewed  Physical Exam   GEN: No acute distress.   Neck: No JVD, no bruits Cardiac:  RRR, no murmurs Respiratory: Clear to auscultation bilaterally. GI: Soft, nontender, non-distended  MS: No edema Neuro:  Nonfocal  Psych: Normal affect   Labs    Chemistry  Recent Labs Lab 07/08/16 0557 07/09/16 0432 07/10/16 0355 07/11/16 0216  NA 136 137 136 136  K 4.0 4.0 4.2 4.0  CL 105 104 105 107  CO2 GLUCOSE 132* 136* 124* 146*  BUN CREATININE 1.77* 1.71* 1.69* 1.52*  CALCIUM 8.3* 8.2* 8.6* 8.7*  PROT 5.6*  --  5.9* 6.0*  ALBUMIN 2.8*  --  3.0* 3.1*  AST 24  --  22 25  ALT 16*  --  19 21  ALKPHOS 41  --  39 38  BILITOT 0.8  --  0.5 0.5  GFRNONAA 45* 47* 48* 54*  GFRAA 52* 54* 55* >60  ANIONGAP Hematology  Recent Labs Lab 07/08/16 0557 07/10/16 0355 07/11/16 0216  WBC 9.1 6.8 8.1  RBC 4.05* 4.42 4.37  HGB 11.3* 11.9* 11.7*  HCT 32.5* 35.8* 35.0*  MCV 80.2 81.0 80.1  MCH 27.9 26.9 26.8  MCHC 34.8 33.2 33.4  RDW 13.5 13.6 13.6  PLT 166 234 233    Cardiac Enzymes  Recent Labs Lab 07/05/16 0313 07/05/16 0608 07/05/16 0926 07/05/16 2213  TROPONINI <0.03 <0.03 <0.03 <0.03     Recent Labs Lab 07/04/16 2249  TROPIPOC 0.00      Radiology    Ct Head Wo Contrast  Result Date: 07/10/2016 CLINICAL DATA:  46-year-old diabetic hypertensivensive male with history of alcohol abuse and chronic kidney disease presenting with sudden onset of slurred speech. Initial encounter. EXAM: CT HEAD WITHOUT CONTRAST TECHNIQUE: Contiguous axial images were obtained from the base of the skull through the vertex without intravenous contrast. COMPARISON:  None. FINDINGS: Brain: No intracranial hemorrhage or CT evidence of large acute infarct. No intracranial mass lesion noted on this unenhanced exam. Vascular: No hyperdense vessel. Skull: No acute abnormality. Sinuses/Orbits: No acute orbital abnormality. Visualized paranasal sinuses are clear. Other: Clear mastoid air cells and middle ear cavities. IMPRESSION: No intracranial hemorrhage  or CT evidence of large acute infarct. Electronically Signed   By: Steven  Olson M.D.   On: 07/10/2016 13:53    Cardiac Studies   Cardiac caths  07/05/16 Procedures   Left Heart Cath and Coronary Angiography  Conclusion     Mid RCA lesion, 60 %stenosed.  Mid LAD to Dist LAD lesion, 99 %stenosed.  The left ventricular ejection fraction is 35-45% by visual estimate.    Diffuse in-stent restenosis, 99%, of the stent placed in the mid LAD beyond the second diagonal and first septal perforator. The has features that suggest total occlusion.   50-60% mid RCA stenosis.  Small, widely patent circumflex system.  Distal anterior wall and apical akinesis. LVEF 40%  RECOMMENDATIONS:   Unable to safely attempt PCI on the LAD due to difficulty with guide catheter engagement and stability.  Consider PCI from the femoral approach in a.m. versus revascularization via the CTO team. Will review images. Tentatively scheduled for right femoral PCI tomorrow. I have not yet given dual antiplatelet therapy.     07/09/16   IMPRESSION: Unsuccessful attempt at proximal LAD PCI and re-intervention of a stent ISR CTO for unstable angina. We will discontinue the heparin, removed the sheath and hold pressure and hydrate the patient. He remains on total of 5 therapy. I will review his angiogram since my colleagues perform CTO interventions. The patient could have his diagonal branch ostium stented.  Patient Profile     46 y.o. male with diabetes mellitus and history of coronary disease admitted with chest pain. Cardiac catheterization reveals diffuse in-stent restenosis from 99% in the LAD, 60% right coronary artery and ejection fraction 40%. Also with renal insufficiency.   Assessment & Plan    CAD with unsuccessful attempt at PCI LAD.    For CTO intervention today.  -- if unsuccessful possible CABG.  On ASA, statin, heparin and NTG, plavix.   Also 50-60% RCA stentosis  CK -3, with Cr today  1.52 down from 1.69  Hyperlipidemia  Continue statin  ICM EF  40-45% by Echo on the 14th  Bradycardia on BB coreg 3.125 BID   Diabetes type 2  Glucose 129 -200 on SSI  hgb A 1C 8.9  - on glucotrol, actos, glucophage and januvia as outpt.   Fever likely due to PNA HCAP on Zosyn  Day 5,  CXR suspicious   for possible aspiration.  afebrile.  Recheck 2 view CXR in AM.   -? Ask IM to see again for recommendations.    Signed, Laura Ingold, NP  07/11/2016, 9:06 AM    As above, patient seen and examined. Patient denies chest pain or dyspnea. His creatinine today is 1.52 which is decreased from yesterday. Plan for CTO intervention by Dr. Jordan and Dr Varanasi today. Will need close follow-up of renal function after procedure given risk of contrast nephropathy. Continue present medications. Would consider an ARB after procedure once renal function stable. Question pneumonia being managed by primary care.  Brian Crenshaw, MD  

## 2016-07-11 NOTE — Progress Notes (Signed)
CARDIAC REHAB PHASE I   PRE:  Rate/Rhythm: 57 SB    BP: sitting 134/81    SaO2:   MODE:  Ambulation: 450 ft   POST:  Rate/Rhythm: 71 SR    BP: sitting 133/94     SaO2:   Pt has been walking this am with family but wanted to walk again. Independent, quick pace, denied angina. DBP elevated. Will f/u tomorrow for ed. [id number]   Harriet Masson CES, ACSM 07/11/2016 10:39 AM

## 2016-07-11 NOTE — Interval H&P Note (Signed)
History and Physical Interval Note:  07/11/2016 1:12 PM  [name]  has presented today for surgery, with the diagnosis of cad  The various methods of treatment have been discussed with the patient and family. After consideration of risks, benefits and other options for treatment, the patient has consented to  Procedure(s): Coronary CTO Intervention (N/A) as a surgical intervention .  The patient's history has been reviewed, patient examined, no change in status, stable for surgery.  I have reviewed the patient's chart and labs.  Questions were answered to the patient's satisfaction.   Cath Lab Visit (complete for each Cath Lab visit)  Clinical Evaluation Leading to the Procedure:   ACS: Yes.    Non-ACS:    Anginal Classification: CCS III  Anti-ischemic medical therapy: Maximal Therapy (2 or more classes of medications)  Non-Invasive Test Results: No non-invasive testing performed  Prior CABG: No previous CABG        Theron Arista [facility] 07/11/2016 1:13 PM

## 2016-07-11 NOTE — Progress Notes (Signed)
PROGRESS NOTE                                                                                                                                                                                                             Patient Demographics:    [name], is a 46 y.o. male, DOB - October 01, 1970, ZOX:[id number]  Admit date - 07/04/2016   Admitting Physician Therisa Doyne, MD  Outpatient Primary MD for the patient is CYNTHIA BUTLER, DO  LOS - 6  Chief Complaint  Patient presents with  . Chest Pain       Brief Narrative  46 y.o.malewith CAD (anterior MI s/p thrombectomy/BMS to mLAD 01/2009, ICM, DM, HTN, HLD, probable CKD II (Cr baseline 1.2-1.4), medication noncompliance, remote alcohol abuse admitted for chest pain. Patient underwent cardiac cath on 4/12. Found to have  in-stent restenosis, creatinine increase post cath. Subsequently patient spiked a fever on 4/14 likely due to aspiration pneumonia.   Subjective:    [name] today has, No headache, No chest pain, No abdominal pain - No Nausea, No new weakness tingling or numbness, No Cough - SOB.    Assessment  & Plan :     1.Chest pain related to CAD. Seen by cardiology, underwent catheterization 2 - showing diffuse in-stent restenosis in LAD, 50-60% stenosis in RCA, EF 40% with anterior wall and apical akinesis. Per Cardiology he has been maintained on medical treatment and currently chest pain-free, continue aspirin, Plavix, Coreg along with statin for secondary prevention he is due for his third left heart catheterization for CTO.  2.Asp PNA - happened in the hospital, stable on Zosyn, afebrile with normal white cell count now, symptom-free, causation to ballottement and on 07/11/2016.  3. ARF and CKD 2 . Baseline creatinine is 1.4, renal function currently stable after some decline initially. Monitor loosely.  4. Chronic slurred speech. CT stable. This is a  chronic problem.  5. Hypertension. Able on Coreg.  6. DM type II. Currently on sliding scale.  Lab Results  Component Value Date   HGBA1C 8.9 (H) 07/05/2016   CBG (last 3)   Recent Labs  07/10/16 2204 07/11/16 0643 07/11/16 1209  GLUCAP 191* 129* 95      Diet : Diet NPO time specified    Family Communication  : Family bedside  Code Status :  Full  Disposition Plan  :  Home 1-2 days  Consults  :  Cards  Procedures  :      DVT Prophylaxis  :    Heparin    Lab Results  Component Value Date   PLT 233 07/11/2016    Inpatient Medications  Scheduled Meds: . [START ON 07/12/2016] aspirin  81 mg Oral Daily  . atorvastatin  80 mg Oral Daily  . carvedilol  3.125 mg Oral BID WC  . citalopram  20 mg Oral Daily  . clopidogrel  75 mg Oral Q breakfast  . insulin aspart  0-15 Units Subcutaneous TID WC  . insulin aspart  0-5 Units Subcutaneous QHS  . sodium chloride flush  3 mL Intravenous Q12H   Continuous Infusions: . sodium chloride    . sodium chloride 1 mL/kg/hr (07/10/16 2300)  . nitroGLYCERIN 10 mcg/min (07/10/16 1800)  . piperacillin-tazobactam (ZOSYN)  IV     PRN Meds:.sodium chloride, acetaminophen, hydrALAZINE, morphine injection, nitroGLYCERIN, ondansetron (ZOFRAN) IV, sodium chloride flush  Antibiotics  :    Anti-infectives    Start     Dose/Rate Route Frequency Ordered Stop   07/07/16 1400  piperacillin-tazobactam (ZOSYN) IVPB 3.375 g  Status:  Discontinued     3.375 g 12.5 mL/hr over 240 Minutes Intravenous Every 8 hours 07/07/16 0950 07/07/16 0951   07/07/16 1000  piperacillin-tazobactam (ZOSYN) IVPB 3.375 g     3.375 g 12.5 mL/hr over 240 Minutes Intravenous Every 8 hours 07/07/16 0951     07/06/16 2200  cefTRIAXone (ROCEPHIN) 1 g in dextrose 5 % 50 mL IVPB  Status:  Discontinued     1 g 100 mL/hr over 30 Minutes Intravenous Every 24 hours 07/06/16 2103 07/07/16 0910         Objective:   Vitals:   07/10/16 2200 07/11/16 0430 07/11/16  0701 07/11/16 1026  BP: (!) 143/84 127/75 123/84 134/87  Pulse: (!) 59 (!) 58 69 (!) 55  Resp: 20 (!) 21 (!) 25 16  Temp: 98.2 F (36.8 C) 97.8 F (36.6 C) 97.7 F (36.5 C) 97.4 F (36.3 C)  TempSrc: Oral Oral Oral Oral  SpO2: 94% 95% 95% 99%  Weight:  78 kg (171 lb 15.3 oz)    Height:        Wt Readings from Last 3 Encounters:  07/11/16 78 kg (171 lb 15.3 oz)  10/05/09 75.3 kg (166 lb)  05/25/09 72.6 kg (160 lb)     Intake/Output Summary (Last 24 hours) at 07/11/16 1303 Last data filed at 07/11/16 1200  Gross per 24 hour  Intake             2471 ml  Output             2525 ml  Net              -54 ml     Physical Exam  Awake Alert, Oriented X 3, No new F.N deficits, Normal affect [place].AT,PERRAL Supple Neck,No JVD, No cervical lymphadenopathy appriciated.  Symmetrical Chest wall movement, Good air movement bilaterally, CTAB RRR,No Gallops,Rubs or new Murmurs, No Parasternal Heave +ve B.Sounds, Abd Soft, No tenderness, No organomegaly appriciated, No rebound - guarding or rigidity. No Cyanosis, Clubbing or edema, No new Rash or bruise      Data Review:    CBC  Recent Labs Lab 07/06/16 0534 07/07/16 0245 07/08/16 0557 07/10/16 0355 07/11/16 0216  WBC 13.8* 12.0* 9.1 6.8 8.1  HGB 12.2* 10.9* 11.3* 11.9*  11.7*  HCT 36.3* 32.4* 32.5* 35.8* 35.0*  PLT 221 175 166 234 233  MCV 81.0 80.2 80.2 81.0 80.1  MCH 27.2 27.0 27.9 26.9 26.8  MCHC 33.6 33.6 34.8 33.2 33.4  RDW 13.7 13.3 13.5 13.6 13.6    Chemistries   Recent Labs Lab 07/06/16 0534  07/07/16 0245 07/08/16 0557 07/09/16 0432 07/10/16 0355 07/11/16 0216  NA 137  < > 135 136 137 136 136  K 4.6  < > 3.6 4.0 4.0 4.2 4.0  CL 103  < > 100* 105 104 105 107  CO2 24  < > GLUCOSE 222*  < > 123* 132* 136* 124* 146*  BUN 17  < > CREATININE 2.16*  < > 1.82* 1.77* 1.71* 1.69* 1.52*  CALCIUM 8.4*  < > 8.1* 8.3* 8.2* 8.6* 8.7*  AST 30  --  18 24  --  22 25  ALT 20  --  15*  16*  --  19 21  ALKPHOS 46  --  40 41  --  39 38  BILITOT 0.8  --  0.8 0.8  --  0.5 0.5  < > = values in this interval not displayed. ------------------------------------------------------------------------------------------------------------------ No results for input(s): CHOL, HDL, LDLCALC, TRIG, CHOLHDL, LDLDIRECT in the last 72 hours.  Lab Results  Component Value Date   HGBA1C 8.9 (H) 07/05/2016   ------------------------------------------------------------------------------------------------------------------ No results for input(s): TSH, T4TOTAL, T3FREE, THYROIDAB in the last 72 hours.  Invalid input(s): FREET3 ------------------------------------------------------------------------------------------------------------------ No results for input(s): VITAMINB12, FOLATE, FERRITIN, TIBC, IRON, RETICCTPCT in the last 72 hours.  Coagulation profile  Recent Labs Lab 07/05/16 0926  INR 1.14    No results for input(s): DDIMER in the last 72 hours.  Cardiac Enzymes  Recent Labs Lab 07/05/16 0608 07/05/16 0926 07/05/16 2213  TROPONINI <0.03 <0.03 <0.03   ------------------------------------------------------------------------------------------------------------------ No results found for: BNP  Micro Results Recent Results (from the past 240 hour(s))  Culture, blood (routine x 2)     Status: None (Preliminary result)   Collection Time: 07/06/16  9:52 PM  Result Value Ref Range Status   Specimen Description BLOOD BLOOD LEFT HAND  Final   Special Requests   Final    BOTTLES DRAWN AEROBIC AND ANAEROBIC Blood Culture results may not be optimal due to an excessive volume of blood received in culture bottles   Culture NO GROWTH 4 DAYS  Final   Report Status PENDING  Incomplete  Culture, blood (routine x 2)     Status: None (Preliminary result)   Collection Time: 07/06/16 10:00 PM  Result Value Ref Range Status   Specimen Description BLOOD BLOOD RIGHT HAND  Final   Special  Requests   Final    BOTTLES DRAWN AEROBIC AND ANAEROBIC Blood Culture results may not be optimal due to an excessive volume of blood received in culture bottles   Culture NO GROWTH 4 DAYS  Final   Report Status PENDING  Incomplete    Radiology Reports Dg Chest 2 View  Result Date: 07/04/2016 CLINICAL DATA:  Chest pain with dyspnea radiating across a chest EXAM: CHEST  2 VIEW COMPARISON:  11/07/2014 FINDINGS: The heart size and mediastinal contours are within normal limits. Both lungs are clear. The visualized skeletal structures are unremarkable. IMPRESSION: No active cardiopulmonary disease. Electronically Signed   By: Tollie Eth M.D.   On: 07/04/2016 23:05   Ct Head Wo Contrast  Result Date: 07/10/2016 CLINICAL DATA:  46 year old diabetic hypertensive male with history of alcohol abuse and chronic kidney disease presenting with sudden onset of slurred speech. Initial encounter. EXAM: CT HEAD WITHOUT CONTRAST TECHNIQUE: Contiguous axial images were obtained from the base of the skull through the vertex without intravenous contrast. COMPARISON:  None. FINDINGS: Brain: No intracranial hemorrhage or CT evidence of large acute infarct. No intracranial mass lesion noted on this unenhanced exam. Vascular: No hyperdense vessel. Skull: No acute abnormality. Sinuses/Orbits: No acute orbital abnormality. Visualized paranasal sinuses are clear. Other: Clear mastoid air cells and middle ear cavities. IMPRESSION: No intracranial hemorrhage or CT evidence of large acute infarct. Electronically Signed   By: Lacy Duverney M.D.   On: 07/10/2016 13:53   Dg Chest Port 1 View  Result Date: 07/06/2016 CLINICAL DATA:  Fever EXAM: PORTABLE CHEST 1 VIEW COMPARISON:  07/04/2016 chest radiograph. FINDINGS: Low lung volumes. Stable cardiomediastinal silhouette with top-normal heart size. No pneumothorax. No pleural effusion. Patchy bibasilar lung opacities appear new. IMPRESSION: Low lung volumes with new patchy bibasilar  lung opacities, which could represent atelectasis, aspiration and/ or pneumonia. Follow-up PA and lateral chest radiographs are warranted. Electronically Signed   By: Delbert Phenix M.D.   On: 07/06/2016 21:58    Time Spent in minutes  30   Susa Raring M.D on 07/11/2016 at 1:03 PM  Between 7am to 7pm - Pager - [phone] ( page via [facility], text pages only, please mention full 10 digit call back number). After 7pm go to [email or web address] - password [email or web address]

## 2016-07-11 NOTE — Progress Notes (Signed)
Site area: 75fr Rt fem art sheath Site Prior to Removal:  Level 0 Pressure Applied For: Manual:  yes  Patient Status During Pull:  A/O Post Pull Site:  Level 0 Post Pull Instructions Given:  Yes, and pt understands post sheath removal instructions Post Pull Pulses Present: 2+ rt pt Dressing Applied:  Tegaderm and a 4x4  Bedrest begins @ 17:00:00 Comments: Neri-RN in to observe rt  Groin. Level 0 is agreed upon. Rt groin dressing is CDI. No problems or complications with sheath pull.

## 2016-07-12 ENCOUNTER — Inpatient Hospital Stay (HOSPITAL_COMMUNITY): Payer: Medicaid Other

## 2016-07-12 ENCOUNTER — Other Ambulatory Visit: Payer: Self-pay | Admitting: Cardiology

## 2016-07-12 ENCOUNTER — Telehealth: Payer: Self-pay | Admitting: Cardiology

## 2016-07-12 DIAGNOSIS — T50905A Adverse effect of unspecified drugs, medicaments and biological substances, initial encounter: Secondary | ICD-10-CM

## 2016-07-12 DIAGNOSIS — N289 Disorder of kidney and ureter, unspecified: Secondary | ICD-10-CM

## 2016-07-12 LAB — BASIC METABOLIC PANEL
ANION GAP: 7 (ref 5–15)
BUN: 8 mg/dL (ref 6–20)
CO2: 22 mmol/L (ref 22–32)
Calcium: 8.9 mg/dL (ref 8.9–10.3)
Chloride: 106 mmol/L (ref 101–111)
Creatinine, Ser: 1.52 mg/dL — ABNORMAL HIGH (ref 0.61–1.24)
GFR calc Af Amer: >60 mL/min (ref >60)
GFR, EST NON AFRICAN AMERICAN: 54 mL/min — AB (ref >60)
GLUCOSE: 127 mg/dL — AB (ref 65–99)
Potassium: 4.1 mmol/L (ref 3.5–5.1)
SODIUM: 135 mmol/L (ref 135–145)

## 2016-07-12 LAB — CBC
HCT: 37.8 % — ABNORMAL LOW (ref 39.0–52.0)
Hemoglobin: 12.8 g/dL — ABNORMAL LOW (ref 13.0–17.0)
MCH: 27.1 pg (ref 26.0–34.0)
MCHC: 33.9 g/dL (ref 30.0–36.0)
MCV: 79.9 fL (ref 78.0–100.0)
PLATELETS: 250 10*3/uL (ref 150–400)
RBC: 4.73 MIL/uL (ref 4.22–5.81)
RDW: 13.3 % (ref 11.5–15.5)
WBC: 8.5 10*3/uL (ref 4.0–10.5)

## 2016-07-12 LAB — GLUCOSE, CAPILLARY: Glucose-Capillary: 103 mg/dL — ABNORMAL HIGH (ref 65–99)

## 2016-07-12 MED ORDER — CARVEDILOL 3.125 MG PO TABS: 3.1250 mg | ORAL_TABLET | Freq: Two times a day (BID) | ORAL | 0 refills | Status: DC

## 2016-07-12 MED ORDER — ATORVASTATIN CALCIUM 80 MG PO TABS: 80.0000 mg | ORAL_TABLET | Freq: Every day | ORAL | 0 refills | Status: DC

## 2016-07-12 MED ORDER — ASPIRIN 81 MG PO CHEW: 81.0000 mg | CHEWABLE_TABLET | Freq: Every day | ORAL | 3 refills | Status: DC

## 2016-07-12 MED ORDER — CLOPIDOGREL BISULFATE 75 MG PO TABS: 75.0000 mg | ORAL_TABLET | Freq: Every day | ORAL | 0 refills | Status: DC

## 2016-07-12 MED ORDER — METFORMIN HCL 500 MG PO TABS: 500.0000 mg | ORAL_TABLET | Freq: Two times a day (BID) | ORAL | 0 refills | Status: DC

## 2016-07-12 NOTE — Telephone Encounter (Signed)
Pt was supposed to see his primary doctor in 4 days and he is not able to get an appointment until May. He wants to know what is he supposed to do? He just had a stent put in and was discharged today.He was instructed to have lab work and xrays at his primary care doctor.

## 2016-07-12 NOTE — Discharge Summary (Signed)
Jann Ra ZOX:[id number] DOB: 08-Sep-1970 DOA: 07/04/2016  PCP: Samuel Jester, DO  Admit date: 07/04/2016  Discharge date: 07/12/2016  Admitted From: Home   Disposition:  Home   Recommendations for Outpatient Follow-up:   Follow up with PCP in 1-2 weeks  PCP Please obtain BMP/CBC, 2 view CXR in 1week,  (see Discharge instructions)   PCP Please follow up on the following pending results: Monitor BMP, CBGs, make sure patient is on aspirin and Plavix for at least 1 year and follows with cardiology closely   Home Health: None   Equipment/Devices: None  Consultations: Cards Discharge Condition: Stable   CODE STATUS: Full   Diet Recommendation: Diet heart healthy/carb modified    Chief Complaint  Patient presents with  . Chest Pain     Brief history of present illness from the day of admission and additional interim summary    46 y.o.malewith CAD (anterior MI s/p thrombectomy/BMS to mLAD 01/2009, ICM, DM, HTN, HLD, probable CKD II (Cr baseline 1.2-1.4), medication noncompliance, remote alcohol abuse admitted for chest pain. Patient underwent cardiac cath on 4/12. Found to have in-stent restenosis, creatinine increase post cath. Subsequently patient spiked a fever on 4/14 likely due to aspiration pneumonia.                                                                 Hospital Course   1.Chest pain related to CAD. Seen by cardiology, underwent catheterization  3 - showing diffuse in-stent restenosis in LAD, 50-60% stenosis in RCA, EF 40% with anterior wall and apical akinesis. , final Cath yesterday with DES in mid LAD , he has been placed on dual antiplatelet therapy with aspirin and Plavix to be continued for at least 1 year, continue Coreg and high intensity statin. Now completely symptom-free will follow  with PCP and cardiology closely. Cleared by cardiology today for home discharge.   2. Asp PNA - happened in the hospital, stable on Zosyn, afebrile with normal white cell count now finished antibiotic course.  3. ARF and CKD 2 . Baseline creatinine is 1.4, renal function currently stable  and close to his baseline after some decline initially, request PCP to continue monitoring his BMP closely.  4. Chronic slurred speech. CT stable. This is a chronic problem.  5. Hypertension. Stable on Coreg.  6. DM type II. Continue home regimen follow with PCP for glycemic control.     Discharge diagnosis     Principal Problem:   CORONARY ATHEROSCLEROSIS NATIVE CORONARY ARTERY Active Problems:   Type 2 diabetes mellitus, uncontrolled (HCC)   Essential hypertension   Chest pain   Acute renal failure superimposed on stage 3 chronic kidney disease (HCC)   Chronic kidney disease (CKD), active medical management without dialysis, stage 3 (moderate)   Unstable angina (HCC)  Discharge instructions    Discharge Instructions    Amb Referral to Cardiac Rehabilitation    Complete by:  As directed    Diagnosis:   Coronary Stents PTCA     Discharge instructions    Complete by:  As directed    Follow with Primary MD CYNTHIA BUTLER, DO in 4 days   Get CBC, CMP, 2 view Chest X ray checked  by Primary MD or SNF MD in 4 days ( we routinely change or add medications that can affect your baseline labs and fluid status, therefore we recommend that you get the mentioned basic workup next visit with your PCP, your PCP may decide not to get them or add new tests based on their clinical decision)  Activity: As tolerated with Full fall precautions use walker/cane & assistance as needed  Disposition Home    Diet:   Diet heart healthy/carb modified .  Accuchecks 4 times/day, Once in AM empty stomach and then before each meal. Log in all results and show them to your Prim.MD in 3 days. If any glucose  reading is under 80 or above 300 call your Prim MD immidiately. Follow Low glucose instructions for glucose under 80 as instructed.   For Heart failure patients - Check your Weight same time everyday, if you gain over 2 pounds, or you develop in leg swelling, experience more shortness of breath or chest pain, call your Primary MD immediately. Follow Cardiac Low Salt Diet and 1.5 lit/day fluid restriction.  On your next visit with your primary care physician please Get Medicines reviewed and adjusted.  Please request your Prim.MD to go over all Hospital Tests and Procedure/Radiological results at the follow up, please get all Hospital records sent to your Prim MD by signing hospital release before you go home.  If you experience worsening of your admission symptoms, develop shortness of breath, life threatening emergency, suicidal or homicidal thoughts you must seek medical attention immediately by calling 911 or calling your MD immediately  if symptoms less severe.  You Must read complete instructions/literature along with all the possible adverse reactions/side effects for all the Medicines you take and that have been prescribed to you. Take any new Medicines after you have completely understood and accpet all the possible adverse reactions/side effects.   Do not drive, operate heavy machinery, perform activities at heights, swimming or participation in water activities or provide baby sitting services if your were admitted for syncope or siezures until you have seen by Primary MD or a Neurologist and advised to do so again.  Do not drive when taking Pain medications.    Do not take more than prescribed Pain, Sleep and Anxiety Medications  Special Instructions: If you have smoked or chewed Tobacco  in the last 2 yrs please stop smoking, stop any regular Alcohol  and or any Recreational drug use.  Wear Seat belts while driving.   Please note  You were cared for by a hospitalist during  your hospital stay. If you have any questions about your discharge medications or the care you received while you were in the hospital after you are discharged, you can call the unit and asked to speak with the hospitalist on call if the hospitalist that took care of you is not available. Once you are discharged, your primary care physician will handle any further medical issues. Please note that NO REFILLS for any discharge medications will be authorized once you are discharged, as it is imperative that you return  to your primary care physician (or establish a relationship with a primary care physician if you do not have one) for your aftercare needs so that they can reassess your need for medications and monitor your lab values.   Increase activity slowly    Complete by:  As directed       Discharge Medications   Allergies as of 07/12/2016   No Known Allergies     Medication List    TAKE these medications   aspirin 81 MG chewable tablet Chew 1 tablet (81 mg total) by mouth daily. Start taking on:  07/13/2016   atorvastatin 80 MG tablet Commonly known as:  LIPITOR Take 1 tablet (80 mg total) by mouth daily. Start taking on:  07/13/2016 What changed:  medication strength  how much to take   carvedilol 3.125 MG tablet Commonly known as:  COREG Take 1 tablet (3.125 mg total) by mouth 2 (two) times daily with a meal.   citalopram 20 MG tablet Commonly known as:  CELEXA Take 1 tablet (20 mg total) by mouth daily. For depression and irritability.   clopidogrel 75 MG tablet Commonly known as:  PLAVIX Take 1 tablet (75 mg total) by mouth daily with breakfast. Start taking on:  07/13/2016   glipiZIDE 10 MG tablet Commonly known as:  GLUCOTROL Take 10 mg by mouth daily before breakfast.   hydrOXYzine 25 MG tablet Commonly known as:  ATARAX/VISTARIL Take 25 mg by mouth 3 (three) times daily.   metFORMIN 500 MG tablet Commonly known as:  GLUCOPHAGE Take 1 tablet (500 mg total) by  mouth 2 (two) times daily with a meal. For diabetes Start taking on:  07/14/2016   nitroGLYCERIN 0.4 MG SL tablet Commonly known as:  NITROSTAT Place 1 tablet (0.4 mg total) under the tongue every 5 (five) minutes as needed for chest pain. For chest pain. History of MI.   pioglitazone 30 MG tablet Commonly known as:  ACTOS Take 30 mg by mouth daily.   sitaGLIPtin 50 MG tablet Commonly known as:  JANUVIA Take 50 mg by mouth daily.       Follow-up Information    Rollene Rotunda, MD Follow up on 07/25/2016.   Specialty:  Cardiology Why:  10:00AM with Ward Givens, NP for Dr. Antoine Poche. Contact information: [address] [place] [address] [phone]        CYNTHIA BUTLER, DO. Schedule an appointment as soon as possible for a visit in 4 day(s).   Contact information: [address] [place] [address] [phone]           Major procedures and Radiology Reports - PLEASE review detailed and final reports thoroughly  -      Cath x 3 -    Mid LAD to Dist LAD lesion, 99 %stenosed with in stent CTO  A STENT SYNERGY DES [id number] drug eluting stent was successfully placed.  Post intervention, there is a 0% residual stenosis.   1. Successful CTO PCI of the mid LAD with DES  Plan: DAPT for at least one year. Consider DAPT indefinitely given 2 layers of stent in mid LAD. Aggressive risk factor modification. Anticipate DC in am if no complications.    Dg Chest 2 View  Result Date: 07/12/2016 CLINICAL DATA:  Evaluate pneumonia EXAM: CHEST  2 VIEW COMPARISON:  07/06/2016 FINDINGS: Cardiomediastinal silhouette is stable. Improvement in aeration without segmental infiltrate or pulmonary edema. Trace residual right pleural effusion. IMPRESSION: Improvement in aeration without segmental  infiltrate or pulmonary edema. Trace residual right pleural effusion. Electronically Signed   By: Natasha Mead M.D.   On: 07/12/2016 08:17   Dg Chest 2 View  Result Date:  07/04/2016 CLINICAL DATA:  Chest pain with dyspnea radiating across a chest EXAM: CHEST  2 VIEW COMPARISON:  11/07/2014 FINDINGS: The heart size and mediastinal contours are within normal limits. Both lungs are clear. The visualized skeletal structures are unremarkable. IMPRESSION: No active cardiopulmonary disease. Electronically Signed   By: Tollie Eth M.D.   On: 07/04/2016 23:05   Ct Head Wo Contrast  Result Date: 07/10/2016 CLINICAL DATA:  46 year old diabetic hypertensive male with history of alcohol abuse and chronic kidney disease presenting with sudden onset of slurred speech. Initial encounter. EXAM: CT HEAD WITHOUT CONTRAST TECHNIQUE: Contiguous axial images were obtained from the base of the skull through the vertex without intravenous contrast. COMPARISON:  None. FINDINGS: Brain: No intracranial hemorrhage or CT evidence of large acute infarct. No intracranial mass lesion noted on this unenhanced exam. Vascular: No hyperdense vessel. Skull: No acute abnormality. Sinuses/Orbits: No acute orbital abnormality. Visualized paranasal sinuses are clear. Other: Clear mastoid air cells and middle ear cavities. IMPRESSION: No intracranial hemorrhage or CT evidence of large acute infarct. Electronically Signed   By: Lacy Duverney M.D.   On: 07/10/2016 13:53   Dg Chest Port 1 View  Result Date: 07/06/2016 CLINICAL DATA:  Fever EXAM: PORTABLE CHEST 1 VIEW COMPARISON:  07/04/2016 chest radiograph. FINDINGS: Low lung volumes. Stable cardiomediastinal silhouette with top-normal heart size. No pneumothorax. No pleural effusion. Patchy bibasilar lung opacities appear new. IMPRESSION: Low lung volumes with new patchy bibasilar lung opacities, which could represent atelectasis, aspiration and/ or pneumonia. Follow-up PA and lateral chest radiographs are warranted. Electronically Signed   By: Delbert Phenix M.D.   On: 07/06/2016 21:58    Micro Results     Recent Results (from the past 240 hour(s))  Culture,  blood (routine x 2)     Status: None   Collection Time: 07/06/16  9:52 PM  Result Value Ref Range Status   Specimen Description BLOOD BLOOD LEFT HAND  Final   Special Requests   Final    BOTTLES DRAWN AEROBIC AND ANAEROBIC Blood Culture results may not be optimal due to an excessive volume of blood received in culture bottles   Culture NO GROWTH 5 DAYS  Final   Report Status 07/11/2016 FINAL  Final  Culture, blood (routine x 2)     Status: None   Collection Time: 07/06/16 10:00 PM  Result Value Ref Range Status   Specimen Description BLOOD BLOOD RIGHT HAND  Final   Special Requests   Final    BOTTLES DRAWN AEROBIC AND ANAEROBIC Blood Culture results may not be optimal due to an excessive volume of blood received in culture bottles   Culture NO GROWTH 5 DAYS  Final   Report Status 07/11/2016 FINAL  Final    Today   Subjective    [name] today has no headache,no chest abdominal pain,no new weakness tingling or numbness, feels much better wants to go home today.     Objective   Blood pressure 129/84, pulse (!) 57, temperature 97.6 F (36.4 C), temperature source Oral, resp. rate (!) 26, height  (1.753 m), weight 74.7 kg (164 lb 10.9 oz), SpO2 96 %.   Intake/Output Summary (Last 24 hours) at 07/12/16 0959 Last data filed at 07/12/16 0848  Gross per 24 hour  Intake  1899.5 ml  Output             2550 ml  Net           -650.5 ml    Exam Awake Alert, Oriented x 3, No new F.N deficits, Normal affect [employer].AT,PERRAL Supple Neck,No JVD, No cervical lymphadenopathy appriciated.  Symmetrical Chest wall movement, Good air movement bilaterally, CTAB RRR,No Gallops,Rubs or new Murmurs, No Parasternal Heave +ve B.Sounds, Abd Soft, Non tender, No organomegaly appriciated, No rebound -guarding or rigidity. No Cyanosis, Clubbing or edema, No new Rash or bruise   Data Review   CBC w Diff: Lab Results  Component Value Date   WBC 8.5 07/12/2016   HGB 12.8 (L) 07/12/2016    HCT 37.8 (L) 07/12/2016   PLT 250 07/12/2016   LYMPHOPCT 22 08/20/2011   MONOPCT 6 08/20/2011   EOSPCT 1 08/20/2011   BASOPCT 0 08/20/2011    CMP: Lab Results  Component Value Date   NA 135 07/12/2016   K 4.1 07/12/2016   CL 106 07/12/2016   CO2 22 07/12/2016   BUN 8 07/12/2016   CREATININE 1.52 (H) 07/12/2016   PROT 6.0 (L) 07/11/2016   ALBUMIN 3.1 (L) 07/11/2016   BILITOT 0.5 07/11/2016   ALKPHOS 38 07/11/2016   AST 25 07/11/2016   ALT 21 07/11/2016  . Lab Results  Component Value Date   HGBA1C 8.9 (H) 07/05/2016       Total Time in preparing paper work, data evaluation and todays exam - 35 minutes  Susa Raring M.D on 07/12/2016 at 9:59 AM  [facility]   Office  [phone]

## 2016-07-12 NOTE — Progress Notes (Signed)
CARDIAC REHAB PHASE I   PRE:  Rate/Rhythm: 66 SR    BP: sitting 129/84    SaO2:   MODE:  Ambulation: 450 ft   POST:  Rate/Rhythm: 76 SR    BP: sitting 132/86     SaO2:   Tolerated well at quick pace. sts he feels much better. Ed completed. Will refer to [facility] CPRII. Understands importance of Plavix/ASA.  [phone]   Harriet Masson CES, ACSM 07/12/2016 9:44 AM

## 2016-07-12 NOTE — Care Management Note (Signed)
Case Management Note  Patient Details  Name: [name] MRN: [id number] Date of Birth: 10-03-1970  Subjective/Objective:  s/p stent intervention (unsuccessful), will re cath tomorrow , will be on plavix, NCM will follow for dc needs.  4/19 4098 Letha Cape RN, BSN-  Successful stent intervention , will be on plavix and asa.                                  Action/Plan:   Expected Discharge Date:                  Expected Discharge Plan:  Home/Self Care  In-House Referral:     Discharge planning Services  CM Consult  Post Acute Care Choice:    Choice offered to:     DME Arranged:    DME Agency:     HH Arranged:    HH Agency:     Status of Service:  Completed, signed off  If discussed at [employer] of Stay Meetings, dates discussed:    Additional Comments:  Leone Haven, RN 07/12/2016, 7:07 AM

## 2016-07-12 NOTE — Telephone Encounter (Signed)
Goes directly to VM notification w no means to leave VM. I'm unsure how to advise. Pt has f/u w Thayer Ohm on 5/2 and PCP f/u after that. As long as he is asymptomatic and taking meds as advised is this follow up window appropriate?

## 2016-07-12 NOTE — Progress Notes (Signed)
  Speech Language Pathology Patient Details Name: [name] MRN: [id number] DOB: 1970/11/28 Today's Date: 07/12/2016 Time:  -        Received order for swallow assessment. CXR 4/13 with bibasilar lung opacities, which could represent atelectasis, aspiration and/ or pneumonia. Swallow evaluation not ordered until 4 days later. Attempted to see yesterday however pt NPO for procedure. Repeat CXR today showed improvement in aeration without segmental infiltrate or pulmonary edema. RN unaware of swallow difficulties. Have been unsuccessful in reaching Dr. Thedore Mins thus far with several attempts to discuss if swallow assessment is still warranted for pt. Noted pt has discharge summary in chart. Will continue efforts to contact MD.   Royce Macadamia 07/12/2016, 12:15 PM  Breck Coons Lonell Face.Ed [employer] [phone]

## 2016-07-12 NOTE — Discharge Instructions (Signed)
Follow with Primary MD CYNTHIA BUTLER, DO in 4 days   Get CBC, CMP, 2 view Chest X ray checked  by Primary MD or SNF MD in 4 days ( we routinely change or add medications that can affect your baseline labs and fluid status, therefore we recommend that you get the mentioned basic workup next visit with your PCP, your PCP may decide not to get them or add new tests based on their clinical decision)  Activity: As tolerated with Full fall precautions use walker/cane & assistance as needed  Disposition Home    Diet:   Diet heart healthy/carb modified .  Accuchecks 4 times/day, Once in AM empty stomach and then before each meal. Log in all results and show them to your Prim.MD in 3 days. If any glucose reading is under 80 or above 300 call your Prim MD immidiately. Follow Low glucose instructions for glucose under 80 as instructed.  For Heart failure patients - Check your Weight same time everyday, if you gain over 2 pounds, or you develop in leg swelling, experience more shortness of breath or chest pain, call your Primary MD immediately. Follow Cardiac Low Salt Diet and 1.5 lit/day fluid restriction.  On your next visit with your primary care physician please Get Medicines reviewed and adjusted.  Please request your Prim.MD to go over all Hospital Tests and Procedure/Radiological results at the follow up, please get all Hospital records sent to your Prim MD by signing hospital release before you go home.  If you experience worsening of your admission symptoms, develop shortness of breath, life threatening emergency, suicidal or homicidal thoughts you must seek medical attention immediately by calling 911 or calling your MD immediately  if symptoms less severe.  You Must read complete instructions/literature along with all the possible adverse reactions/side effects for all the Medicines you take and that have been prescribed to you. Take any new Medicines after you have completely understood and  accpet all the possible adverse reactions/side effects.   Do not drive, operate heavy machinery, perform activities at heights, swimming or participation in water activities or provide baby sitting services if your were admitted for syncope or siezures until you have seen by Primary MD or a Neurologist and advised to do so again.  Do not drive when taking Pain medications.    Do not take more than prescribed Pain, Sleep and Anxiety Medications  Special Instructions: If you have smoked or chewed Tobacco  in the last 2 yrs please stop smoking, stop any regular Alcohol  and or any Recreational drug use.  Wear Seat belts while driving.   Please note  You were cared for by a hospitalist during your hospital stay. If you have any questions about your discharge medications or the care you received while you were in the hospital after you are discharged, you can call the unit and asked to speak with the hospitalist on call if the hospitalist that took care of you is not available. Once you are discharged, your primary care physician will handle any further medical issues. Please note that NO REFILLS for any discharge medications will be authorized once you are discharged, as it is imperative that you return to your primary care physician (or establish a relationship with a primary care physician if you do not have one) for your aftercare needs so that they can reassess your need for medications and monitor your lab values.     Call [facility] at [phone] if any  bleeding, swelling or drainage at cath site.  May shower, no tub baths for 48 hours for groin sticks. No lifting over 5 pounds for 7 days.  No Driving if you drive til seen in the office.    Take 1 NTG, under your tongue, while sitting.  If no relief of pain may repeat NTG, one tab every 5 minutes up to 3 tablets total over 15 minutes.  If no relief CALL 911.  If you have dizziness/lightheadness  while taking NTG, stop  taking and call 911.         Heart Healthy Diabetic Diet  Have lab work done tomorrow morning - on first floor of Dr. Jenene Slicker office building to check kidney function.     Call if any problems or questions.   DO NOT STOP PLAVIX AND ASPRIN these medications keep the stent open.

## 2016-07-12 NOTE — Progress Notes (Signed)
Progress Note  Patient Name: [name] Date of Encounter: 07/12/2016  Primary Cardiologist: Dr. Daiva Nakayama   Subjective   Pt denies dyspnea or chest pain.  Inpatient Medications    Scheduled Meds: . amoxicillin-clavulanate  1 tablet Oral Q12H  . aspirin  81 mg Oral Daily  . atorvastatin  80 mg Oral Daily  . carvedilol  3.125 mg Oral BID WC  . citalopram  20 mg Oral Daily  . clopidogrel  75 mg Oral Q breakfast  . heparin  5,000 Units Subcutaneous Q8H  . insulin aspart  0-15 Units Subcutaneous TID WC  . insulin aspart  0-5 Units Subcutaneous QHS  . sodium chloride flush  3 mL Intravenous Q12H  . sodium chloride flush  3 mL Intravenous Q12H   Continuous Infusions: . sodium chloride    . sodium chloride     PRN Meds: sodium chloride, sodium chloride, acetaminophen, hydrALAZINE, morphine injection, nitroGLYCERIN, ondansetron (ZOFRAN) IV, sodium chloride flush, sodium chloride flush   Vital Signs    Vitals:   07/11/16 1845 07/11/16 1900 07/11/16 1943 07/12/16 0227  BP:  107/66 107/66 119/64  Pulse: 70 64 65 63  Resp: (!) 21 (!) 22 20 (!) 25  Temp:   98 F (36.7 C) 98 F (36.7 C)  TempSrc:   Oral Oral  SpO2: 97% 98% 97% 96%  Weight:    164 lb 10.9 oz (74.7 kg)  Height:        Intake/Output Summary (Last 24 hours) at 07/12/16 0732 Last data filed at 07/12/16 0250  Gross per 24 hour  Intake           2617.5 ml  Output             2425 ml  Net            192.5 ml   Filed Weights   07/10/16 0402 07/11/16 0430 07/12/16 0227  Weight: 169 lb 12.1 oz (77 kg) 171 lb 15.3 oz (78 kg) 164 lb 10.9 oz (74.7 kg)    Telemetry    Sinus bradycardia - Personally Reviewed  ECG    07/10/16  SB at 53 T wave inversions inf lat. leads similar to previous.- Personally Reviewed  Physical Exam   GEN: WD/WN NAD Neck: Supple Cardiac: RRR Respiratory: CTA GI: Soft, nontender, non-distended, right femoral cath site with no hematoma and no bruit MS: No edema Neuro:   Nonfocal  Psych: Normal affect   Labs    Chemistry  Recent Labs Lab 07/08/16 0557  07/10/16 0355 07/11/16 0216 07/12/16 0320  NA 136  < > 136 136 135  K 4.0  < > 4.2 4.0 4.1  CL 105  < > 105 107 106  CO2 23  < > GLUCOSE 132*  < > 124* 146* 127*  BUN 15  < > CREATININE 1.77*  < > 1.69* 1.52* 1.52*  CALCIUM 8.3*  < > 8.6* 8.7* 8.9  PROT 5.6*  --  5.9* 6.0*  --   ALBUMIN 2.8*  --  3.0* 3.1*  --   AST 24  --  22 25  --   ALT 16*  --  19 21  --   ALKPHOS 41  --  39 38  --   BILITOT 0.8  --  0.5 0.5  --   GFRNONAA 45*  < > 48* 54* 54*  GFRAA 52*  < > 55* >60 >60  ANIONGAP 8  < >  < > = values in this interval not displayed.   Hematology  Recent Labs Lab 07/10/16 0355 07/11/16 0216 07/12/16 0320  WBC 6.8 8.1 8.5  RBC 4.42 4.37 4.73  HGB 11.9* 11.7* 12.8*  HCT 35.8* 35.0* 37.8*  MCV 81.0 80.1 79.9  MCH 26.9 26.8 27.1  MCHC 33.2 33.4 33.9  RDW 13.6 13.6 13.3  PLT 234 233 250    Cardiac Enzymes  Recent Labs Lab 07/05/16 0926 07/05/16 2213  TROPONINI <0.03 <0.03      Radiology    Ct Head Wo Contrast  Result Date: 07/10/2016 CLINICAL DATA:  46 year old diabetic hypertensive male with history of alcohol abuse and chronic kidney disease presenting with sudden onset of slurred speech. Initial encounter. EXAM: CT HEAD WITHOUT CONTRAST TECHNIQUE: Contiguous axial images were obtained from the base of the skull through the vertex without intravenous contrast. COMPARISON:  None. FINDINGS: Brain: No intracranial hemorrhage or CT evidence of large acute infarct. No intracranial mass lesion noted on this unenhanced exam. Vascular: No hyperdense vessel. Skull: No acute abnormality. Sinuses/Orbits: No acute orbital abnormality. Visualized paranasal sinuses are clear. Other: Clear mastoid air cells and middle ear cavities. IMPRESSION: No intracranial hemorrhage or CT evidence of large acute infarct. Electronically Signed   By: Lacy Duverney M.D.   On:  07/10/2016 13:53    Cardiac Studies   Cardiac caths  07/05/16 Procedures   Left Heart Cath and Coronary Angiography  Conclusion     Mid RCA lesion, 60 %stenosed.  Mid LAD to Dist LAD lesion, 99 %stenosed.  The left ventricular ejection fraction is 35-45% by visual estimate.    Diffuse in-stent restenosis, 99%, of the stent placed in the mid LAD beyond the second diagonal and first septal perforator. The has features that suggest total occlusion.   50-60% mid RCA stenosis.  Small, widely patent circumflex system.  Distal anterior wall and apical akinesis. LVEF 40%  RECOMMENDATIONS:   Unable to safely attempt PCI on the LAD due to difficulty with guide catheter engagement and stability.  Consider PCI from the femoral approach in a.m. versus revascularization via the CTO team. Will review images. Tentatively scheduled for right femoral PCI tomorrow. I have not yet given dual antiplatelet therapy.     07/09/16   IMPRESSION: Unsuccessful attempt at proximal LAD PCI and re-intervention of a stent ISR CTO for unstable angina. We will discontinue the heparin, removed the sheath and hold pressure and hydrate the patient. He remains on total of 5 therapy. I will review his angiogram since my colleagues perform CTO interventions. The patient could have his diagonal branch ostium stented.  Patient Profile     46 y.o. male with diabetes mellitus and history of coronary disease admitted with chest pain. Cardiac catheterization reveals diffuse in-stent restenosis from 99% in the LAD, 60% right coronary artery and ejection fraction 40%. Now s/p PCI LAD. Also with renal insufficiency.   Assessment & Plan    CAD now s/p PCI of LAD; continue ASA, plavix, statin and coreg. Also 50-60% RCA stentosis  CK 3 renal function stable this AM. Would repeat BMET tomorrow with results to Dr Antoine Poche  Hyperlipidemia  Continue statin  ICM EF  40-45% continue coreg; add ARB as outpt as BP  and renal function allow.  Diabetes type 2  Hold glucophage for 48 hrs following PCI.  ? Pneumonia Management per primary care  Pt can be DCed from a cardiac standpoint; TOC appt one week; FU with Dr  Hochrein 3 months.  Signed, Olga Millers, MD  07/12/2016, 7:32 AM

## 2016-07-12 NOTE — Telephone Encounter (Signed)
Can he get in to see an APP this week.  Thanks.

## 2016-07-16 NOTE — Telephone Encounter (Signed)
Attempted to reach caller, no answer or VM when dialed.

## 2016-07-16 NOTE — Telephone Encounter (Signed)
Routed to triage pool to f/u

## 2016-07-18 NOTE — Telephone Encounter (Signed)
Spoke with the patient. Informed him that we had tried to get in touch previously. He stated that he had everything worked out and he would keep his appointment on 5/2 with Nicolasa Ducking, NP.

## 2016-07-25 ENCOUNTER — Ambulatory Visit (INDEPENDENT_AMBULATORY_CARE_PROVIDER_SITE_OTHER): Payer: Medicaid Other | Admitting: Nurse Practitioner

## 2016-07-25 ENCOUNTER — Encounter: Payer: Self-pay | Admitting: Nurse Practitioner

## 2016-07-25 VITALS — BP 144/92 | HR 78 | Ht 68.0 in | Wt 171.0 lb

## 2016-07-25 DIAGNOSIS — N183 Chronic kidney disease, stage 3 unspecified: Secondary | ICD-10-CM

## 2016-07-25 DIAGNOSIS — E1122 Type 2 diabetes mellitus with diabetic chronic kidney disease: Secondary | ICD-10-CM | POA: Diagnosis not present

## 2016-07-25 DIAGNOSIS — Z91199 Patient's noncompliance with other medical treatment and regimen due to unspecified reason: Secondary | ICD-10-CM

## 2016-07-25 DIAGNOSIS — E785 Hyperlipidemia, unspecified: Secondary | ICD-10-CM | POA: Diagnosis not present

## 2016-07-25 DIAGNOSIS — I10 Essential (primary) hypertension: Secondary | ICD-10-CM

## 2016-07-25 DIAGNOSIS — I255 Ischemic cardiomyopathy: Secondary | ICD-10-CM

## 2016-07-25 DIAGNOSIS — I2510 Atherosclerotic heart disease of native coronary artery without angina pectoris: Secondary | ICD-10-CM | POA: Diagnosis not present

## 2016-07-25 DIAGNOSIS — Z9119 Patient's noncompliance with other medical treatment and regimen: Secondary | ICD-10-CM

## 2016-07-25 LAB — CBC
HCT: 40.6 % (ref 38.5–50.0)
HEMOGLOBIN: 13.3 g/dL (ref 13.2–17.1)
MCH: 26.7 pg — ABNORMAL LOW (ref 27.0–33.0)
MCHC: 32.8 g/dL (ref 32.0–36.0)
MCV: 81.4 fL (ref 80.0–100.0)
MPV: 9.1 fL (ref 7.5–12.5)
Platelets: 270 10*3/uL (ref 140–400)
RBC: 4.99 MIL/uL (ref 4.20–5.80)
RDW: 14.4 % (ref 11.0–15.0)
WBC: 7.6 10*3/uL (ref 3.8–10.8)

## 2016-07-25 LAB — BASIC METABOLIC PANEL
BUN: 13 mg/dL (ref 7–25)
CO2: 23 mmol/L (ref 20–31)
Calcium: 8.7 mg/dL (ref 8.6–10.3)
Chloride: 104 mmol/L (ref 98–110)
Creat: 1.3 mg/dL (ref 0.60–1.35)
GLUCOSE: 106 mg/dL — AB (ref 65–99)
POTASSIUM: 5.1 mmol/L (ref 3.5–5.3)
SODIUM: 138 mmol/L (ref 135–146)

## 2016-07-25 MED ORDER — CARVEDILOL 3.125 MG PO TABS: 3.1250 mg | ORAL_TABLET | Freq: Two times a day (BID) | ORAL | 3 refills | Status: DC

## 2016-07-25 MED ORDER — ATORVASTATIN CALCIUM 80 MG PO TABS: 80.0000 mg | ORAL_TABLET | Freq: Every day | ORAL | 3 refills | Status: DC

## 2016-07-25 MED ORDER — LOSARTAN POTASSIUM 25 MG PO TABS: 25.0000 mg | ORAL_TABLET | Freq: Every day | ORAL | 3 refills | Status: DC

## 2016-07-25 MED ORDER — CLOPIDOGREL BISULFATE 75 MG PO TABS: 75.0000 mg | ORAL_TABLET | Freq: Every day | ORAL | 3 refills | Status: DC

## 2016-07-25 MED ORDER — NITROGLYCERIN 0.4 MG SL SUBL: 0.4000 mg | SUBLINGUAL_TABLET | SUBLINGUAL | 2 refills | Status: DC | PRN

## 2016-07-25 NOTE — Patient Instructions (Signed)
Medication Instructions: Please start taking Losartan 25 mg tablet daily  Labwork: Please have the following labs drawn today: CBC, BMET Please have the following labs drawn in one week: BMET Please have the following labs drawn in 6 weeks: Lipid panel and Hepatic   Follow-Up: Your physician recommends that you schedule a follow-up appointment in: 2 to 3 months with Dr. Antoine Poche in [place]   If you need a refill on your cardiac medications before your next appointment, please call your pharmacy.

## 2016-07-25 NOTE — Progress Notes (Signed)
Office Visit    Patient Name: [name] Date of Encounter: 07/25/2016  Primary Care Provider:  Remus Loffler, PA-C Primary Cardiologist:  J. Hochrein, MD   Chief Complaint    46 year old male with a history of CAD, hypertension, hyperlipidemia, diabetes, ischemic myopathy, and noncompliance, who was recently admitted with angina and required repeat stenting of the LAD.  Past Medical History    Past Medical History:  Diagnosis Date  . Alcohol abuse   . CAD (coronary artery disease), native coronary artery    a. 01/2009 s/p Anterior MI and thrombectomy with BMS to mid LAD;  b. 06/2016 Cath/PCI: LM nl, LAD 41m/d, LCX nl, OM1/2/3 nl, RDA 62m. Initial attempt @ PCI failed followed by successful CTO LAD PCI and DES (2.5x38 Synergy)-->rec for indefinte DAPT.  . CKD (chronic kidney disease), stage III   . Essential hypertension   . History of nonadherence to medical treatment   . Hyperlipidemia   . Ischemic cardiomyopathy    a. EF 40% by nuc in 2012;  b. 06/2016 Echo: EF 40-45%, apicalanteroseptal and apical AK, Gr2 DD, mod Ca2+ Ao annulus, mild MR.  [name] Slurred speech - Chronic   . Type 2 diabetes mellitus (HCC)    Past Surgical History:  Procedure Laterality Date  . CORONARY CTO INTERVENTION N/A 07/11/2016   Procedure: Coronary CTO Intervention;  Surgeon: Peter M Swaziland, MD;  Location[facility] INVASIVE CV LAB;  Service: Cardiovascular;  Laterality: N/A;  . CORONARY STENT INTERVENTION N/A 07/09/2016   Procedure: Coronary Stent Intervention;  Surgeon: Runell Gess, MD;  Location: [facility] INVASIVE CV LAB;  Service: Cardiovascular;  Laterality: N/A;  . Knee arthroscopic surgery Right   . LEFT HEART CATH AND CORONARY ANGIOGRAPHY N/A 07/05/2016   Procedure: Left Heart Cath and Coronary Angiography;  Surgeon: Lyn Records, MD;  Location: [facility] INVASIVE CV LAB;  Service: Cardiovascular;  Laterality: N/A;    Allergies  No Known Allergies  History of Present Illness    46 year old male with the  above complex past medical history including anterior myocardial infarction November 2010 status post thrombectomy and bare metal stenting to the mid LAD at that time. Other history includes hypertension, hyperlipidemia, ischemic cardiomyopathy, diabetes, and noncompliance. He had been lost to follow-up since 2012. He was recently admitted with chest pain and ruled out. He underwent diagnostic catheterization revealing severe in-stent restenosis in the previous loop placed LAD stent. An initial attempt at PCI was made however, was unsuccessful. This was then reviewed with our CTO team and he subsequently underwent successful PCI and drug-eluting stent placement within the LAD. He was noted to have renal insufficiency throughout the hospitalization, though it was stable. He also developed aspiration pneumonia during hospitalization and required antibiotic therapy. At discharge, it was recommended that he follow up with primary care within the week for follow-up blood work and chest x-ray. He was not able to get an appointment with primary care until later this week.  Since discharge, he has done well without chest pain, dyspnea, palpitations, PND, orthopnea, dizziness, syncope, edema, or early satiety. He is looking forward to beginning exercising again. He is considering cardiac rehabilitation. He and his family report compliance with his medications. Blood sugars are much better controlled, typically in the 04/09/2048 range, on multiple oral diabetic medications.  Home Medications    Prior to Admission medications   Medication Sig Start Date End Date Taking? Authorizing Provider  aspirin 81 MG chewable tablet Chew 1 tablet (81 mg total)  by mouth daily. 07/13/16  Yes Leroy Sea, MD  atorvastatin (LIPITOR) 80 MG tablet Take 1 tablet (80 mg total) by mouth daily. 07/13/16  Yes Leroy Sea, MD  carvedilol (COREG) 3.125 MG tablet Take 1 tablet (3.125 mg total) by mouth 2 (two) times daily with a meal.  07/12/16  Yes Leroy Sea, MD  citalopram (CELEXA) 20 MG tablet Take 1 tablet (20 mg total) by mouth daily. For depression and irritability. 08/24/11  Yes Viviann Spare, FNP  clopidogrel (PLAVIX) 75 MG tablet Take 1 tablet (75 mg total) by mouth daily with breakfast. 07/13/16  Yes Leroy Sea, MD  glipiZIDE (GLUCOTROL) 10 MG tablet Take 10 mg by mouth daily before breakfast.   Yes Historical Provider, MD  hydrOXYzine (ATARAX/VISTARIL) 25 MG tablet Take 25 mg by mouth 3 (three) times daily.   Yes Historical Provider, MD  metFORMIN (GLUCOPHAGE) 500 MG tablet Take 1 tablet (500 mg total) by mouth 2 (two) times daily with a meal. For diabetes 07/14/16  Yes Leroy Sea, MD  nitroGLYCERIN (NITROSTAT) 0.4 MG SL tablet Place 1 tablet (0.4 mg total) under the tongue every 5 (five) minutes as needed for chest pain. For chest pain. History of MI. 08/24/11  Yes Viviann Spare, FNP  pioglitazone (ACTOS) 30 MG tablet Take 30 mg by mouth daily.   Yes Historical Provider, MD  sitaGLIPtin (JANUVIA) 50 MG tablet Take 50 mg by mouth daily.   Yes Historical Provider, MD    Review of Systems    Overall doing well.  He denies chest pain, palpitations, dyspnea, pnd, orthopnea, n, v, dizziness, syncope, edema, weight gain, or early satiety.  All other systems reviewed and are otherwise negative except as noted above.  Physical Exam    VS:  BP (!) 144/92   Pulse 78   Ht  (1.727 m)   Wt 171 lb (77.6 kg)   BMI 26.00 kg/m  , BMI Body mass index is 26 kg/m. GEN: Well nourished, well developed, in no acute distress.  HEENT: normal.  Neck: Supple, no JVD, carotid bruits, or masses. Cardiac: RRR, no murmurs, rubs, or gallops. No clubbing, cyanosis, edema.  Radials/DP/PT 2+ and equal bilaterally. Right wrist catheterization site and right groin catheterization site clear of bleeding, bruit, or hematoma. Respiratory:  Respirations regular and unlabored, clear to auscultation bilaterally. GI: Soft,  nontender, nondistended, BS + x 4. MS: no deformity or atrophy. Skin: warm and dry, no rash. Neuro:  Strength and sensation are intact. Psych: Normal affect.  Accessory Clinical Findings    ECG - Sinus rhythm, 75, left axis deviation, left anterior fascicular block, prior inferior and anterolateral infarcts. Overall similar to prior ECGs.  Assessment & Plan    1.  Coronary artery disease: Status post prior anterior myocardial infarction in 2010 with LAD stenting at that time. He was recently admitted with chest pain and ruled out. Catheterization revealed severe subtotal occlusion and in-stent restenosis within the LAD and otherwise nonobstructive disease. After initial failed attempt at PCI of the LAD, the later underwent successful PCI and drug-eluting stent placement. He has done well since discharge without any chest pain or dyspnea. He is considering cardiac rehabilitation. He remains on aspirin, statin, beta blocker, and Plavix therapy. I will refill his medications today and also provide him with prescription for nitroglycerin.   2. Essential hypertension: Blood pressure elevated today. I repeated this and it was 140/90. Continue beta blocker therapy. I am following up a  basic metabolic panel today in the setting of chronic kidney disease and plan to initiate losartan 25 mg daily. He had been on that in the past and tolerated it.  3. Ischemic cardiomyopathy: EF 40-45% by echocardiogram in April. He is euvolemic on exam and has no heart failure symptoms. Continue beta blocker and initiate ARB therapy today. Follow-up basic metabolic panel today. He will have a repeat basic metabolic panel in 1 week through his primary care provider's office and we will provide him with a prescription for that today.  4. Hyperlipidemia: LDL recently 42. He is on high potency atorvastatin therapy. I have provided him with a prescription for follow-up lipids and LFTs in 6 weeks to be drawn at his primary care  provider's office in [place].  5. Stage III chronic kidney disease: Follow-up basic metabolic panel today. Plan to follow-up in one week given initiation of ARB therapy.  6. Aspiration pneumonia: This occurred during hospitalization. He has recovered well. He is due to have a follow-up chest x-ray. He is following up with primary care on Friday and prefers to have it done there as opposed to having a drop across town to [facility] radiology today.  7. Type 2 diabetes mellitus: A1c was 8.9 during recent hospitalization. Sugars have been much better controlled in the 1:15 to 150 range on Glucotrol, metformin, Actos, and Januvia. He follows up with primary care on Friday.  8. Noncompliance: I stressed the importance of medication and lifestyle compliance. His family is with him today and reiterated that they are doing their best to keep him compliant and he is more motivated at this point.   9. Disposition: Follow-up labs today. He will have follow-up chest x-ray with her Medicare provider on Friday. Follow-up basic metabolic panel in one week. Follow-up lipids and LFTs in 6 week. Follow-up with Dr. Antoine Poche in [place] in 2-3 months.   Nicolasa Ducking, NP 07/25/2016, 10:50 AM

## 2016-07-27 ENCOUNTER — Ambulatory Visit (INDEPENDENT_AMBULATORY_CARE_PROVIDER_SITE_OTHER): Payer: Medicaid Other | Admitting: Physician Assistant

## 2016-07-27 ENCOUNTER — Encounter: Payer: Self-pay | Admitting: Physician Assistant

## 2016-07-27 VITALS — BP 128/84 | HR 81 | Temp 98.8°F | Ht 68.0 in | Wt 170.8 lb

## 2016-07-27 DIAGNOSIS — E1100 Type 2 diabetes mellitus with hyperosmolarity without nonketotic hyperglycemic-hyperosmolar coma (NKHHC): Secondary | ICD-10-CM | POA: Diagnosis not present

## 2016-07-27 DIAGNOSIS — I10 Essential (primary) hypertension: Secondary | ICD-10-CM | POA: Diagnosis not present

## 2016-07-27 DIAGNOSIS — F331 Major depressive disorder, recurrent, moderate: Secondary | ICD-10-CM | POA: Diagnosis not present

## 2016-07-27 DIAGNOSIS — I2510 Atherosclerotic heart disease of native coronary artery without angina pectoris: Secondary | ICD-10-CM | POA: Diagnosis not present

## 2016-07-27 LAB — LIPID PANEL
CHOLESTEROL TOTAL: 146 mg/dL (ref 100–199)
Chol/HDL Ratio: 2.4 ratio (ref 0.0–5.0)
HDL: 62 mg/dL (ref >39)
LDL CALC: 50 mg/dL (ref 0–99)
TRIGLYCERIDES: 170 mg/dL — AB (ref 0–149)
VLDL Cholesterol Cal: 34 mg/dL (ref 5–40)

## 2016-07-27 LAB — CBC WITH DIFFERENTIAL/PLATELET
BASOS ABS: 0.1 10*3/uL (ref 0.0–0.2)
Basos: 1 %
EOS (ABSOLUTE): 0.3 10*3/uL (ref 0.0–0.4)
Eos: 4 %
HEMOGLOBIN: 14.1 g/dL (ref 13.0–17.7)
Hematocrit: 42 % (ref 37.5–51.0)
IMMATURE GRANS (ABS): 0.1 10*3/uL (ref 0.0–0.1)
Immature Granulocytes: 1 %
LYMPHS ABS: 1.4 10*3/uL (ref 0.7–3.1)
LYMPHS: 17 %
MCH: 27.1 pg (ref 26.6–33.0)
MCHC: 33.6 g/dL (ref 31.5–35.7)
MCV: 81 fL (ref 79–97)
MONOCYTES: 9 %
Monocytes Absolute: 0.7 10*3/uL (ref 0.1–0.9)
Neutrophils Absolute: 5.7 10*3/uL (ref 1.4–7.0)
Neutrophils: 68 %
PLATELETS: 268 10*3/uL (ref 150–379)
RBC: 5.21 x10E6/uL (ref 4.14–5.80)
RDW: 14.3 % (ref 12.3–15.4)
WBC: 8.2 10*3/uL (ref 3.4–10.8)

## 2016-07-27 LAB — BAYER DCA HB A1C WAIVED: HB A1C (BAYER DCA - WAIVED): 7.4 % — ABNORMAL HIGH (ref <7.0)

## 2016-07-27 MED ORDER — CITALOPRAM HYDROBROMIDE 40 MG PO TABS: 40.0000 mg | ORAL_TABLET | Freq: Every day | ORAL | 5 refills | Status: DC

## 2016-07-27 MED ORDER — HYDROXYZINE HCL 10 MG PO TABS: 10.0000 mg | ORAL_TABLET | Freq: Three times a day (TID) | ORAL | 5 refills | Status: DC

## 2016-07-27 NOTE — Patient Instructions (Signed)
Carbohydrate Counting for Diabetes Mellitus, Adult Carbohydrate counting is a method for keeping track of how many carbohydrates you eat. Eating carbohydrates naturally increases the amount of sugar (glucose) in the blood. Counting how many carbohydrates you eat helps keep your blood glucose within normal limits, which helps you manage your diabetes (diabetes mellitus). It is important to know how many carbohydrates you can safely have in each meal. This is different for every person. A diet and nutrition specialist (registered dietitian) can help you make a meal plan and calculate how many carbohydrates you should have at each meal and snack. Carbohydrates are found in the following foods:  Grains, such as breads and cereals.  Dried beans and soy products.  Starchy vegetables, such as potatoes, peas, and corn.  Fruit and fruit juices.  Milk and yogurt.  Sweets and snack foods, such as cake, cookies, candy, chips, and soft drinks. How do I count carbohydrates? There are two ways to count carbohydrates in food. You can use either of the methods or a combination of both. Reading "Nutrition Facts" on packaged food  The "Nutrition Facts" list is included on the labels of almost all packaged foods and beverages in the [place] It includes:  The serving size.  Information about nutrients in each serving, including the grams (g) of carbohydrate per serving. To use the "Nutrition Facts":  Decide how many servings you will have.  Multiply the number of servings by the number of carbohydrates per serving.  The resulting number is the total amount of carbohydrates that you will be having. Learning standard serving sizes of other foods  When you eat foods containing carbohydrates that are not packaged or do not include "Nutrition Facts" on the label, you need to measure the servings in order to count the amount of carbohydrates:  Measure the foods that you will eat with a food scale or measuring  cup, if needed.  Decide how many standard-size servings you will eat.  Multiply the number of servings by 15. Most carbohydrate-rich foods have about 15 g of carbohydrates per serving.  For example, if you eat 8 oz (170 g) of strawberries, you will have eaten 2 servings and 30 g of carbohydrates (2 servings x 15 g = 30 g).  For foods that have more than one food mixed, such as soups and casseroles, you must count the carbohydrates in each food that is included. The following list contains standard serving sizes of common carbohydrate-rich foods. Each of these servings has about 15 g of carbohydrates:   hamburger bun or  English muffin.   oz (15 mL) syrup.   oz (14 g) jelly.  1 slice of bread.  1 six-inch tortilla.  3 oz (85 g) cooked rice or pasta.  4 oz (113 g) cooked dried beans.  4 oz (113 g) starchy vegetable, such as peas, corn, or potatoes.  4 oz (113 g) hot cereal.  4 oz (113 g) mashed potatoes or  of a large baked potato.  4 oz (113 g) canned or frozen fruit.  4 oz (120 mL) fruit juice.  4-6 crackers.  6 chicken nuggets.  6 oz (170 g) unsweetened dry cereal.  6 oz (170 g) plain fat-free yogurt or yogurt sweetened with artificial sweeteners.  8 oz (240 mL) milk.  8 oz (170 g) fresh fruit or one small piece of fruit.  24 oz (680 g) popped popcorn. Example of carbohydrate counting Sample meal  3 oz (85 g) chicken breast.  6 oz (  170 g) brown rice.  4 oz (113 g) corn.  8 oz (240 mL) milk.  8 oz (170 g) strawberries with sugar-free whipped topping. Carbohydrate calculation 1. Identify the foods that contain carbohydrates:  Rice.  Corn.  Milk.  Strawberries. 2. Calculate how many servings you have of each food:  2 servings rice.  1 serving corn.  1 serving milk.  1 serving strawberries. 3. Multiply each number of servings by 15 g:  2 servings rice x 15 g = 30 g.  1 serving corn x 15 g = 15 g.  1 serving milk x 15 g = 15  g.  1 serving strawberries x 15 g = 15 g. 4. Add together all of the amounts to find the total grams of carbohydrates eaten:  30 g + 15 g + 15 g + 15 g = 75 g of carbohydrates total. This information is not intended to replace advice given to you by your health care provider. Make sure you discuss any questions you have with your health care provider. Document Released: 03/12/2005 Document Revised: 09/30/2015 Document Reviewed: 08/24/2015 Elsevier Interactive Patient Education  2017 [employer]  

## 2016-07-27 NOTE — Progress Notes (Signed)
BP 128/84   Pulse 81   Temp 98.8 F (37.1 C) (Oral)   Ht 5\' 8"  (1.727 m)   Wt 170 lb 12.8 oz (77.5 kg)   BMI 25.97 kg/m    Subjective:    Patient ID: [name], male    DOB: Nov 09, 1970, 46 y.o.   MRN: [id number]  HPI: [name] is a 46 y.o. male presenting on 07/27/2016 for Hospitalization Follow-up  This patient comes in for periodic recheck on medications and conditions including diabetes, CAD, HTN, hyperlipidemia. He had another hospital stay for occluded stent. They were able to treat with angioplasty. There was discussion of bypass. He drastically needs to control his diet and cholesterol.  He has not had very good control. There is concern about his medication and kidney disease. I have discussed with his wife and him that we have diabetic education here with the clinical pharmacist. They agree to come and work on this..   All medications are reviewed today. There are no reports of any problems with the medications. All of the medical conditions are reviewed and updated.  Lab work is reviewed and will be ordered as medically necessary. There are no new problems reported with today's visit.   Relevant past medical, surgical, family and social history reviewed and updated as indicated. Allergies and medications reviewed and updated.  Past Medical History:  Diagnosis Date  . Alcohol abuse   . CAD (coronary artery disease), native coronary artery    a. 01/2009 s/p Anterior MI and thrombectomy with BMS to mid LAD;  b. 06/2016 Cath/PCI: LM nl, LAD 40m/d, LCX nl, OM1/2/3 nl, RDA 80m. Initial attempt @ PCI failed followed by successful CTO LAD PCI and DES (2.5x38 Synergy)-->rec for indefinte DAPT.  . CKD (chronic kidney disease), stage III   . Essential hypertension   . History of nonadherence to medical treatment   . Hyperlipidemia   . Ischemic cardiomyopathy    a. EF 40% by nuc in 2012;  b. 06/2016 Echo: EF 40-45%, apicalanteroseptal and apical AK, Gr2 DD, mod Ca2+ Ao annulus,  mild MR.  [name] Slurred speech - Chronic   . Type 2 diabetes mellitus (HCC)     Past Surgical History:  Procedure Laterality Date  . CORONARY CTO INTERVENTION N/A 07/11/2016   Procedure: Coronary CTO Intervention;  Surgeon: Peter M Swaziland, MD;  [facility] INVAS[facility] CV LAB;  Service: Cardiovascular;  Laterality: N/A;  . CORONARY STENT INTERVENTION N/A 07/09/2016   Procedure: Coronary Stent Intervention;  Surgeon: Runell Gess, MD;  Location: [facility] INVASIVE CV LAB;  Service: Cardiovascular;  Laterality: N/A;  . Knee arthroscopic surgery Right   . LEFT HEART CATH AND CORONARY ANGIOGRAPHY N/A 07/05/2016   Procedure: Left Heart Cath and Coronary Angiography;  Surgeon: Lyn Records, MD;  Location: [facility] INVASIVE CV LAB;  Service: Cardiovascular;  Laterality: N/A;    Review of Systems  Constitutional: Negative.  Negative for appetite change and fatigue.  HENT: Negative.   Eyes: Negative.  Negative for pain and visual disturbance.  Respiratory: Negative.  Negative for cough, chest tightness, shortness of breath and wheezing.   Cardiovascular: Negative.  Negative for chest pain, palpitations and leg swelling.  Gastrointestinal: Negative.  Negative for abdominal pain, diarrhea, nausea and vomiting.  Endocrine: Negative.   Genitourinary: Negative.   Musculoskeletal: Negative.   Skin: Negative.  Negative for color change and rash.  Neurological: Negative.  Negative for weakness, numbness and headaches.  Psychiatric/Behavioral: Negative.     Allergies as  of 07/27/2016   No Known Allergies     Medication List       Accurate as of 07/27/16 11:59 PM. Always use your most recent med list.          aspirin 81 MG chewable tablet Chew 1 tablet (81 mg total) by mouth daily.   atorvastatin 80 MG tablet Commonly known as:  LIPITOR Take 1 tablet (80 mg total) by mouth daily.   carvedilol 3.125 MG tablet Commonly known as:  COREG Take 1 tablet (3.125 mg total) by mouth 2 (two) times daily with a  meal.   citalopram 40 MG tablet Commonly known as:  CELEXA Take 1 tablet (40 mg total) by mouth daily. For depression and irritability.   clopidogrel 75 MG tablet Commonly known as:  PLAVIX Take 1 tablet (75 mg total) by mouth daily with breakfast.   glipiZIDE 10 MG tablet Commonly known as:  GLUCOTROL Take 10 mg by mouth daily before breakfast.   hydrOXYzine 10 MG tablet Commonly known as:  ATARAX/VISTARIL Take 1 tablet (10 mg total) by mouth 3 (three) times daily.   losartan 25 MG tablet Commonly known as:  COZAAR Take 1 tablet (25 mg total) by mouth daily.   metFORMIN 500 MG tablet Commonly known as:  GLUCOPHAGE Take 1 tablet (500 mg total) by mouth 2 (two) times daily with a meal. For diabetes   nitroGLYCERIN 0.4 MG SL tablet Commonly known as:  NITROSTAT Place 1 tablet (0.4 mg total) under the tongue every 5 (five) minutes as needed for chest pain. For chest pain. History of MI.   pioglitazone 30 MG tablet Commonly known as:  ACTOS Take 30 mg by mouth daily.   sitaGLIPtin 50 MG tablet Commonly known as:  JANUVIA Take 50 mg by mouth daily.          Objective:    BP 128/84   Pulse 81   Temp 98.8 F (37.1 C) (Oral)   Ht 5\' 8"  (1.727 m)   Wt 170 lb 12.8 oz (77.5 kg)   BMI 25.97 kg/m   No Known Allergies  Physical Exam  Constitutional: He appears well-developed and well-nourished.  HENT:  Head: Normocephalic and atraumatic.  Eyes: Conjunctivae and EOM are normal. Pupils are equal, round, and reactive to light.  Neck: Normal range of motion. Neck supple.  Cardiovascular: Normal rate, regular rhythm and normal heart sounds.   Pulmonary/Chest: Effort normal and breath sounds normal.  Abdominal: Soft. Bowel sounds are normal.  Musculoskeletal: Normal range of motion.  Skin: Skin is warm and dry.    Results for orders placed or performed in visit on 07/27/16  CBC with Differential/Platelet  Result Value Ref Range   WBC 8.2 3.4 - 10.8 x10E3/uL   RBC  5.21 4.14 - 5.80 x10E6/uL   Hemoglobin 14.1 13.0 - 17.7 g/dL   Hematocrit 16.1 09.6 - 51.0 %   MCV 81 79 - 97 fL   MCH 27.1 26.6 - 33.0 pg   MCHC 33.6 31.5 - 35.7 g/dL   RDW 04.5 40.9 - 81.1 %   Platelets 268 150 - 379 x10E3/uL   Neutrophils 68 Not Estab. %   Lymphs 17 Not Estab. %   Monocytes 9 Not Estab. %   Eos 4 Not Estab. %   Basos 1 Not Estab. %   Neutrophils Absolute 5.7 1.4 - 7.0 x10E3/uL   Lymphocytes Absolute 1.4 0.7 - 3.1 x10E3/uL   Monocytes Absolute 0.7 0.1 - 0.9 x10E3/uL   EOS (  ABSOLUTE) 0.3 0.0 - 0.4 x10E3/uL   Basophils Absolute 0.1 0.0 - 0.2 x10E3/uL   Immature Granulocytes 1 Not Estab. %   Immature Grans (Abs) 0.1 0.0 - 0.1 x10E3/uL  Lipid panel  Result Value Ref Range   Cholesterol, Total 146 100 - 199 mg/dL   Triglycerides 161170 (H) 0 - 149 mg/dL   HDL 62 >09>39 mg/dL   VLDL Cholesterol Cal 34 5 - 40 mg/dL   LDL Calculated 50 0 - 99 mg/dL   Chol/HDL Ratio 2.4 0.0 - 5.0 ratio  Bayer DCA Hb A1c Waived  Result Value Ref Range   Bayer DCA Hb A1c Waived 7.4 (H) <7.0 %      Assessment & Plan:   1. Uncontrolled type 2 diabetes mellitus with hyperosmolarity without coma, without long-term current use of insulin (HCC) - CBC with Differential/Platelet - Lipid panel - Bayer DCA Hb A1c Waived  2. Essential hypertension - CBC with Differential/Platelet - Lipid panel  3. Atherosclerosis of native coronary artery of native heart, angina presence unspecified - Lipid panel  4. Moderate episode of recurrent major depressive disorder (HCC) - hydrOXYzine (ATARAX/VISTARIL) 10 MG tablet; Take 1 tablet (10 mg total) by mouth 3 (three) times daily.  Dispense: 90 tablet; Refill: 5 - citalopram (CELEXA) 40 MG tablet; Take 1 tablet (40 mg total) by mouth daily. For depression and irritability.  Dispense: 30 tablet; Refill: 5   Current Outpatient Prescriptions:  .  aspirin 81 MG chewable tablet, Chew 1 tablet (81 mg total) by mouth daily., Disp: 30 tablet, Rfl: 3 .   atorvastatin (LIPITOR) 80 MG tablet, Take 1 tablet (80 mg total) by mouth daily., Disp: 90 tablet, Rfl: 3 .  carvedilol (COREG) 3.125 MG tablet, Take 1 tablet (3.125 mg total) by mouth 2 (two) times daily with a meal., Disp: 180 tablet, Rfl: 3 .  citalopram (CELEXA) 40 MG tablet, Take 1 tablet (40 mg total) by mouth daily. For depression and irritability., Disp: 30 tablet, Rfl: 5 .  clopidogrel (PLAVIX) 75 MG tablet, Take 1 tablet (75 mg total) by mouth daily with breakfast., Disp: 90 tablet, Rfl: 3 .  glipiZIDE (GLUCOTROL) 10 MG tablet, Take 10 mg by mouth daily before breakfast., Disp: , Rfl:  .  hydrOXYzine (ATARAX/VISTARIL) 10 MG tablet, Take 1 tablet (10 mg total) by mouth 3 (three) times daily., Disp: 90 tablet, Rfl: 5 .  losartan (COZAAR) 25 MG tablet, Take 1 tablet (25 mg total) by mouth daily., Disp: 90 tablet, Rfl: 3 .  metFORMIN (GLUCOPHAGE) 500 MG tablet, Take 1 tablet (500 mg total) by mouth 2 (two) times daily with a meal. For diabetes, Disp: 60 tablet, Rfl: 0 .  nitroGLYCERIN (NITROSTAT) 0.4 MG SL tablet, Place 1 tablet (0.4 mg total) under the tongue every 5 (five) minutes as needed for chest pain. For chest pain. History of MI., Disp: 25 tablet, Rfl: 2 .  pioglitazone (ACTOS) 30 MG tablet, Take 30 mg by mouth daily., Disp: , Rfl:  .  sitaGLIPtin (JANUVIA) 50 MG tablet, Take 50 mg by mouth daily., Disp: , Rfl:   Continue all other maintenance medications as listed above.  Follow up plan: Return [facility] about 1 week (around 08/03/2016) [facility], .  Educational handout given for carb counting  Remus LofflerAngel S. Bryndan Bilyk PA-C [facility] [address]  [place], [place] [id number] [phone]   07/30/2016, 12:42 PM

## 2016-08-09 ENCOUNTER — Ambulatory Visit (INDEPENDENT_AMBULATORY_CARE_PROVIDER_SITE_OTHER): Payer: Medicaid Other | Admitting: Pharmacist

## 2016-08-09 ENCOUNTER — Encounter: Payer: Self-pay | Admitting: Pharmacist

## 2016-08-09 VITALS — BP 134/88 | HR 78 | Ht 68.0 in | Wt 171.0 lb

## 2016-08-09 DIAGNOSIS — N183 Chronic kidney disease, stage 3 unspecified: Secondary | ICD-10-CM

## 2016-08-09 DIAGNOSIS — N182 Chronic kidney disease, stage 2 (mild): Secondary | ICD-10-CM | POA: Diagnosis not present

## 2016-08-09 DIAGNOSIS — E1165 Type 2 diabetes mellitus with hyperglycemia: Secondary | ICD-10-CM | POA: Diagnosis not present

## 2016-08-09 DIAGNOSIS — IMO0002 Reserved for concepts with insufficient information to code with codable children: Secondary | ICD-10-CM

## 2016-08-09 DIAGNOSIS — E1122 Type 2 diabetes mellitus with diabetic chronic kidney disease: Secondary | ICD-10-CM

## 2016-08-09 MED ORDER — EMPAGLIFLOZIN 10 MG PO TABS: 10.0000 mg | ORAL_TABLET | Freq: Every day | ORAL | 0 refills | Status: DC

## 2016-08-09 NOTE — Patient Instructions (Addendum)
Stop pioglitazone Start Jardiance '10mg'$  take 1 tablet each morning  Diabetes and Standards of Medical Care   Diabetes is complicated. You may find that your diabetes team includes a dietitian, nurse, diabetes educator, eye doctor, and more. To help everyone know what is going on and to help you get the care you deserve, the following schedule of care was developed to help keep you on track. Below are the tests, exams, vaccines, medicines, education, and plans you will need.  Blood Glucose Goals Prior to meals = 80 - 130 Within 2 hours of the start of a meal = less than 180  HbA1c test (goal is less than 7.0% - your last value was 7.4%) This test shows how well you have controlled your glucose over the past 2 to 3 months. It is used to see if your diabetes management plan needs to be adjusted.   It is performed at least 2 times a year if you are meeting treatment goals.  It is performed 4 times a year if therapy has changed or if you are not meeting treatment goals.  Blood pressure test  This test is performed at every routine medical visit. The goal is less than 140/90 mmHg for most people, but 130/80 mmHg in some cases. Ask your health care provider about your goal.  Dental exam  Follow up with the dentist regularly.  Eye exam  If you are diagnosed with type 1 diabetes as a child, get an exam upon reaching the age of 54 years or older and have had diabetes for 3 to 5 years. Yearly eye exams are recommended after that initial eye exam.  If you are diagnosed with type 1 diabetes as an adult, get an exam within 5 years of diagnosis and then yearly.  If you are diagnosed with type 2 diabetes, get an exam as soon as possible after the diagnosis and then yearly.  Due eye exam now - Try to make appointment at [facility] in [place] [phone])  or [facility] in [place] [phone]  Foot care exam  Visual foot exams are performed at every routine medical visit. The exams check for  cuts, injuries, or other problems with the feet.  A comprehensive foot exam should be done yearly. This includes visual inspection as well as assessing foot pulses and testing for loss of sensation.  Check your feet nightly for cuts, injuries, or other problems with your feet. Tell your health care provider if anything is not healing.  Recommend appointment with podiatrist / foot specialist for nail care  Kidney function test (urine microalbumin)  This test is performed once a year.  Type 1 diabetes: The first test is performed 5 years after diagnosis.  Type 2 diabetes: The first test is performed at the time of diagnosis.  A serum creatinine and estimated glomerular filtration rate (eGFR) test is done once a year to assess the level of chronic kidney disease (CKD), if present.  Lipid profile (cholesterol, HDL, LDL, triglycerides)  Performed every 5 years for most people.  The goal for LDL is less than 100 mg/dL. If you are at high risk, the goal is less than 70 mg/dL.  The goal for HDL is 40 mg/dL to 50 mg/dL for men and 50 mg/dL to 60 mg/dL for women. An HDL cholesterol of 60 mg/dL or higher gives some protection against heart disease.  The goal for triglycerides is less than 150 mg/dL.  Influenza vaccine, pneumococcal vaccine, and hepatitis B vaccine  The influenza vaccine is recommended yearly.  The pneumococcal vaccine is generally given once in a lifetime. However, there are some instances when another vaccination is recommended. Check with your health care provider.  The hepatitis B vaccine is also recommended for adults with diabetes.  Diabetes self-management education  Education is recommended at diagnosis and ongoing as needed.  Treatment plan  Your treatment plan is reviewed at every medical visit.  Document Released: 01/07/2009 Document Revised: 11/12/2012 Document Reviewed: 08/12/2012 [facility] Patient Information 2014 [place].

## 2016-08-09 NOTE — Progress Notes (Signed)
Patient ID: [name], male   DOB: [birth year], 46 y.o.   MRN: [id number]  Subjective:    [name] is a 46 y.o. male who presents for an initial evaluation of Type 2 diabetes mellitus.   [name] is referred by his PCP for uncontrolled type 2 DM.  [name] was recently hospitalized for CP and a coronary stent restenosis.  His serum creatinine increase post cath.  He his most recent eGFR was 54 (07/12/2016).  In hospital it was also determined that A1c was 8.9%.  A1c was rechecked by his PCP 07/27/2016 and had  Decreased to 7.4%.    Current diabetic medications include:  Pioglitazone 40m qd; metformin 5068mbid; januvia 5025md and glipizide 34m23m.    Current monitoring regimen: home blood tests - 1-2 times daily Home blood sugar records: am fasting BG = 140's; after breakfast 200.  Any episodes of hypoglycemia? no  Known diabetic complications: nephropathy and cardiovascular disease Cardiovascular risk factors: advanced age (older than 55 f20 men, 65 f49 women), diabetes mellitus, dyslipidemia, hypertension, male gender and sedentary lifestyle Eye exam current (within one year): no Weight trend: stable Prior visit with CDE: no Current diet: in general, an "unhealthy" diet Current exercise: none Medication Compliance?  Yes   Is He on ACE inhibitor or angiotensin II receptor blocker?  Yes    losartan (Cozaar)   Objective:    BP 134/88   Pulse 78   Ht 5' 8"  (1.727 m)   Wt 171 lb (77.6 kg)   BMI 26.00 kg/m    A1c = 7.4% (07/27/2016)  Diabetic Foot Form - Detailed   Diabetic Foot Exam - detailed Diabetic Foot exam was performed with the following findings:  Yes 08/09/2016  9:59 AM  Is there a history of foot ulcer?:  No Can the patient see the bottom of their feet?:  Yes Are the shoes appropriate in style and fit?:  Yes Is there swelling or and abnormal foot shape?:  No Are the toenails long?:  No Are the toenails thick?:  Yes Do you have pain in calf while  walking?:  No Is there a claw toe deformity?:  No Is there elevated skin temparature?:  No Is there limited skin dorsiflexion?:  No Is there foot or ankle muscle weakness?:  No Are the toenails ingrown?:  Yes Normal Range of Motion:  Yes Pulse Foot Exam completed.:  Yes  Right posterior Tibialias:  Present Left posterior Tibialias:  Diminished  Right Dorsalis Pedis:  Present Left Dorsalis Pedis:  Diminished  Sensory Foot Exam Completed.:  Yes Swelling:  No Semmes-Weinstein Monofilament Test R Foot Test Control:  Pos L Foot Test Control:  Pos  R Site 1-Great Toe:  Pos L Site 1-Great Toe:  Neg  R Site 4:  Pos L Site 4:  Pos  R Site 5:  Pos L Site 5:  Pos    Comments:  Right great toe shows increased thickness.       Lab Review Glucose, Bld (mg/dL)  Date Value  07/25/2016 106 (H)  07/12/2016 127 (H)  07/11/2016 146 (H)   CO2 (mmol/L)  Date Value  07/25/2016 23  07/12/2016 22  07/11/2016 22   BUN (mg/dL)  Date Value  07/25/2016 13  07/12/2016 8  07/11/2016 7   Creat (mg/dL)  Date Value  07/25/2016 1.30   Creatinine, Ser (mg/dL)  Date Value  07/12/2016 1.52 (H)  07/11/2016 1.52 (H)  07/10/2016 1.69 (H)  Assessment:    Diabetes Mellitus type 2 - under improving with significant cardiovascular disease.  control.    Plan:    1.  Rx changes:     D/C pioglitizone   Start Jardiance 70m take 1 tablet daily due to positive cardiovascular effects as well as lowers BG - checking BMET today  Continue metformin 5033mbid, Januvia 5057md and glipizide 22m6m.   2.  Education: Reviewed 'ABCs' of diabetes management (respective goals in parentheses):  A1C (<7), blood pressure (<130/80), and cholesterol (LDL <100). Reminded that eye exam is due and given number to several area optometrists. 3. Discussed pathophysiology of DM; difference between type 1 and type 2 DM. 4. CHO counting diet discussed.  Reviewed CHO amount in various foods and how to read nutrition  labels.  Discussed recommended serving sizes.  5.  Recommend check BG 2  times a day 6.  Recommended increase physical activity - goal is 150 minutes per week 7.  Recommended see podiatrist but patient declined.  8.  Follow up: 4 weeks

## 2016-08-10 ENCOUNTER — Other Ambulatory Visit: Payer: Self-pay | Admitting: Pharmacist

## 2016-08-10 LAB — BMP8+EGFR
BUN/Creatinine Ratio: 10 (ref 9–20)
BUN: 14 mg/dL (ref 6–24)
CALCIUM: 9.8 mg/dL (ref 8.7–10.2)
CHLORIDE: 102 mmol/L (ref 96–106)
CO2: 23 mmol/L (ref 18–29)
Creatinine, Ser: 1.35 mg/dL — ABNORMAL HIGH (ref 0.76–1.27)
GFR calc non Af Amer: 63 mL/min/{1.73_m2} (ref >59)
GFR, EST AFRICAN AMERICAN: 73 mL/min/{1.73_m2} (ref >59)
GLUCOSE: 77 mg/dL (ref 65–99)
POTASSIUM: 5.1 mmol/L (ref 3.5–5.2)
SODIUM: 140 mmol/L (ref 134–144)

## 2016-08-10 MED ORDER — GLUCOSE BLOOD VI STRP
ORAL_STRIP | 2 refills | Status: DC
Start: 2016-08-10 — End: 2016-08-19

## 2016-08-10 MED ORDER — LANCETS MICRO THIN 33G MISC: 2 refills | Status: DC

## 2016-08-10 MED ORDER — ACCU-CHEK AVIVA DEVI: 0 refills | Status: DC

## 2016-08-10 NOTE — Telephone Encounter (Signed)
Patient's wife notified of labs form 08/09/16.  Serum creatinine is stable.  Recommend continue current meds and recheck in 1 month.   She stated that patient needed new glucometer (cannot find his).  New rx sent in for glucometer, test strips and lancets.

## 2016-08-10 NOTE — Progress Notes (Signed)
Notes recorded by Henrene PastorEckard, Tammy, PharmD on 08/10/2016 at 10:16 AM EDT Patient notified of results

## 2016-08-17 ENCOUNTER — Other Ambulatory Visit: Payer: Self-pay | Admitting: *Deleted

## 2016-08-17 MED ORDER — SITAGLIPTIN PHOSPHATE 50 MG PO TABS: 50.0000 mg | ORAL_TABLET | Freq: Every day | ORAL | 1 refills | Status: DC

## 2016-08-17 MED ORDER — GLIPIZIDE 10 MG PO TABS: 10.0000 mg | ORAL_TABLET | Freq: Every day | ORAL | 1 refills | Status: DC

## 2016-08-18 ENCOUNTER — Encounter (HOSPITAL_COMMUNITY): Payer: Self-pay | Admitting: Internal Medicine

## 2016-08-18 ENCOUNTER — Observation Stay (HOSPITAL_COMMUNITY)
Admission: AD | Admit: 2016-08-18 | Discharge: 2016-08-19 | Disposition: A | Discharge status: Home / Self Care | Payer: Medicaid Other | Source: Other Acute Inpatient Hospital | Attending: Internal Medicine | Admitting: Internal Medicine

## 2016-08-18 DIAGNOSIS — Z7902 Long term (current) use of antithrombotics/antiplatelets: Secondary | ICD-10-CM | POA: Diagnosis not present

## 2016-08-18 DIAGNOSIS — F329 Major depressive disorder, single episode, unspecified: Secondary | ICD-10-CM | POA: Diagnosis not present

## 2016-08-18 DIAGNOSIS — Z955 Presence of coronary angioplasty implant and graft: Secondary | ICD-10-CM | POA: Diagnosis not present

## 2016-08-18 DIAGNOSIS — Z7982 Long term (current) use of aspirin: Secondary | ICD-10-CM | POA: Insufficient documentation

## 2016-08-18 DIAGNOSIS — R253 Fasciculation: Secondary | ICD-10-CM

## 2016-08-18 DIAGNOSIS — Z794 Long term (current) use of insulin: Secondary | ICD-10-CM | POA: Insufficient documentation

## 2016-08-18 DIAGNOSIS — E1122 Type 2 diabetes mellitus with diabetic chronic kidney disease: Secondary | ICD-10-CM | POA: Diagnosis not present

## 2016-08-18 DIAGNOSIS — H9202 Otalgia, left ear: Secondary | ICD-10-CM

## 2016-08-18 DIAGNOSIS — I2510 Atherosclerotic heart disease of native coronary artery without angina pectoris: Secondary | ICD-10-CM | POA: Insufficient documentation

## 2016-08-18 DIAGNOSIS — E785 Hyperlipidemia, unspecified: Secondary | ICD-10-CM | POA: Diagnosis not present

## 2016-08-18 DIAGNOSIS — I129 Hypertensive chronic kidney disease with stage 1 through stage 4 chronic kidney disease, or unspecified chronic kidney disease: Secondary | ICD-10-CM | POA: Diagnosis not present

## 2016-08-18 DIAGNOSIS — Z79899 Other long term (current) drug therapy: Secondary | ICD-10-CM | POA: Insufficient documentation

## 2016-08-18 DIAGNOSIS — I255 Ischemic cardiomyopathy: Secondary | ICD-10-CM | POA: Diagnosis not present

## 2016-08-18 DIAGNOSIS — N183 Chronic kidney disease, stage 3 (moderate): Secondary | ICD-10-CM | POA: Diagnosis not present

## 2016-08-18 DIAGNOSIS — R040 Epistaxis: Principal | ICD-10-CM | POA: Insufficient documentation

## 2016-08-18 LAB — GLUCOSE, CAPILLARY: Glucose-Capillary: 234 mg/dL — ABNORMAL HIGH (ref 65–99)

## 2016-08-18 MED ORDER — CARVEDILOL 3.125 MG PO TABS
3.1250 mg | ORAL_TABLET | Freq: Two times a day (BID) | ORAL | Status: DC
  Administered 2016-08-19: 3.125 mg via ORAL
  Filled 2016-08-18: qty 1

## 2016-08-18 MED ORDER — ASPIRIN 81 MG PO CHEW
81.0000 mg | CHEWABLE_TABLET | Freq: Every day | ORAL | Status: DC
  Administered 2016-08-19: 81 mg via ORAL
  Filled 2016-08-18: qty 1

## 2016-08-18 MED ORDER — BENZONATATE 100 MG PO CAPS
100.0000 mg | ORAL_CAPSULE | Freq: Three times a day (TID) | ORAL | Status: DC | PRN
  Administered 2016-08-18 – 2016-08-19 (×2): 100 mg via ORAL
  Filled 2016-08-18 (×2): qty 1

## 2016-08-18 MED ORDER — INSULIN ASPART 100 UNIT/ML ~~LOC~~ SOLN
0.0000 [IU] | Freq: Every day | SUBCUTANEOUS | Status: DC
  Administered 2016-08-18: 2 [IU] via SUBCUTANEOUS

## 2016-08-18 MED ORDER — HYDROXYZINE HCL 10 MG PO TABS
10.0000 mg | ORAL_TABLET | Freq: Three times a day (TID) | ORAL | Status: DC
Start: 2016-08-18 — End: 2016-08-19
  Administered 2016-08-18 – 2016-08-19 (×2): 10 mg via ORAL
  Filled 2016-08-18 (×2): qty 1

## 2016-08-18 MED ORDER — CANAGLIFLOZIN 100 MG PO TABS
100.0000 mg | ORAL_TABLET | Freq: Every day | ORAL | Status: DC
  Filled 2016-08-18: qty 1

## 2016-08-18 MED ORDER — HYDROCODONE-HOMATROPINE 5-1.5 MG/5ML PO SYRP
5.0000 mL | ORAL_SOLUTION | Freq: Four times a day (QID) | ORAL | Status: DC | PRN
  Administered 2016-08-18: 5 mL via ORAL
  Filled 2016-08-18 (×2): qty 5

## 2016-08-18 MED ORDER — SODIUM CHLORIDE 0.9% FLUSH
3.0000 mL | Freq: Two times a day (BID) | INTRAVENOUS | Status: DC
  Administered 2016-08-18 – 2016-08-19 (×2): 3 mL via INTRAVENOUS

## 2016-08-18 MED ORDER — LOSARTAN POTASSIUM 50 MG PO TABS
25.0000 mg | ORAL_TABLET | Freq: Every day | ORAL | Status: DC
  Administered 2016-08-19: 25 mg via ORAL
  Filled 2016-08-18: qty 1

## 2016-08-18 MED ORDER — LINAGLIPTIN 5 MG PO TABS
5.0000 mg | ORAL_TABLET | Freq: Every day | ORAL | Status: DC
  Administered 2016-08-19: 5 mg via ORAL
  Filled 2016-08-18: qty 1

## 2016-08-18 MED ORDER — CLOPIDOGREL BISULFATE 75 MG PO TABS
75.0000 mg | ORAL_TABLET | Freq: Every day | ORAL | Status: DC
  Administered 2016-08-19: 75 mg via ORAL
  Filled 2016-08-18: qty 1

## 2016-08-18 MED ORDER — INSULIN ASPART 100 UNIT/ML ~~LOC~~ SOLN
0.0000 [IU] | Freq: Three times a day (TID) | SUBCUTANEOUS | Status: DC
  Administered 2016-08-19: 1 [IU] via SUBCUTANEOUS

## 2016-08-18 MED ORDER — METFORMIN HCL 500 MG PO TABS
500.0000 mg | ORAL_TABLET | Freq: Two times a day (BID) | ORAL | Status: DC
  Administered 2016-08-19: 500 mg via ORAL
  Filled 2016-08-18: qty 1

## 2016-08-18 MED ORDER — LEVOFLOXACIN 500 MG PO TABS
500.0000 mg | ORAL_TABLET | Freq: Every day | ORAL | Status: DC
  Administered 2016-08-18 – 2016-08-19 (×2): 500 mg via ORAL
  Filled 2016-08-18 (×3): qty 1

## 2016-08-18 MED ORDER — SODIUM CHLORIDE 0.9 % IV SOLN: 250.0000 mL | INTRAVENOUS | Status: DC | PRN

## 2016-08-18 MED ORDER — CITALOPRAM HYDROBROMIDE 20 MG PO TABS
40.0000 mg | ORAL_TABLET | Freq: Every day | ORAL | Status: DC
  Administered 2016-08-19: 40 mg via ORAL
  Filled 2016-08-18: qty 2

## 2016-08-18 MED ORDER — ACETAMINOPHEN 325 MG PO TABS: 650.0000 mg | ORAL_TABLET | Freq: Four times a day (QID) | ORAL | Status: DC | PRN

## 2016-08-18 MED ORDER — GLIPIZIDE 5 MG PO TABS
10.0000 mg | ORAL_TABLET | Freq: Every day | ORAL | Status: DC
  Administered 2016-08-19: 10 mg via ORAL
  Filled 2016-08-18: qty 2

## 2016-08-18 MED ORDER — ACETAMINOPHEN 650 MG RE SUPP
650.0000 mg | Freq: Four times a day (QID) | RECTAL | Status: DC | PRN
Start: 2016-08-18 — End: 2016-08-19

## 2016-08-18 MED ORDER — AMOXICILLIN-POT CLAVULANATE 875-125 MG PO TABS: 1.0000 | ORAL_TABLET | Freq: Two times a day (BID) | ORAL | Status: DC

## 2016-08-18 MED ORDER — SODIUM CHLORIDE 0.9% FLUSH: 3.0000 mL | INTRAVENOUS | Status: DC | PRN

## 2016-08-18 MED ORDER — ATORVASTATIN CALCIUM 80 MG PO TABS
80.0000 mg | ORAL_TABLET | Freq: Every day | ORAL | Status: DC
  Administered 2016-08-19: 80 mg via ORAL
  Filled 2016-08-18: qty 1

## 2016-08-18 NOTE — Progress Notes (Signed)
Patient reports acute dizziness and diaphoresis. Room lights dimmed and patient HOB lowered. HRR, respirations even and unlabored. Will continue to monitor. VSS 97.9-82-18-130/84 (95)-94%

## 2016-08-18 NOTE — Progress Notes (Signed)
2nd page for admission orders

## 2016-08-18 NOTE — Progress Notes (Signed)
Pt admitted to unit, awaiting admission orders

## 2016-08-18 NOTE — Progress Notes (Signed)
[facility] for admission orders

## 2016-08-18 NOTE — Consult Note (Signed)
Reason for Consult: Epistaxis Referring Physician: Pearson Grippe, MD  HPI:  [name] is an 46 y.o. male who presented to [facility] hospital earlier today c/o profuse left epistaxis since last night. A merocel packing was placed, and the patient was sent to [facility] for further evaluation and treatment. He is currently on aspirin and plavix due to his recently placed heart stent. No previous ENT surgery.  Past Medical History:  Diagnosis Date  . Alcohol abuse   . CAD (coronary artery disease), native coronary artery    a. 01/2009 s/p Anterior MI and thrombectomy with BMS to mid LAD;  b. 06/2016 Cath/PCI: LM nl, LAD 35m/d, LCX nl, OM1/2/3 nl, RDA 73m. Initial attempt @ PCI failed followed by successful CTO LAD PCI and DES (2.5x38 Synergy)-->rec for indefinte DAPT.  . CKD (chronic kidney disease), stage III   . Essential hypertension   . History of nonadherence to medical treatment   . Hyperlipidemia   . Ischemic cardiomyopathy    a. EF 40% by nuc in 2012;  b. 06/2016 Echo: EF 40-45%, apicalanteroseptal and apical AK, Gr2 DD, mod Ca2+ Ao annulus, mild MR.  [name] Slurred speech - Chronic   . Type 2 diabetes mellitus (HCC)     Past Surgical History:  Procedure Laterality Date  . CORONARY CTO INTERVENTION N/A 07/11/2016   Procedure: Coronary CTO Intervention;  Surgeon: Peter M Swaziland, MD;  Location: [facility] INVASIVE CV LAB;  Service: Cardiovascular;  Laterality: N/A;  . CORONARY STENT INTERVENTION N/A 07/09/2016   Procedure: Coronary Stent Intervention;  Surgeon: Runell Gess, MD;  Location: [facility] INVASIVE CV LAB;  Service: Cardiovascular;  Laterality: N/A;  . Knee arthroscopic surgery Right   . LEFT HEART CATH AND CORONARY ANGIOGRAPHY N/A 07/05/2016   Procedure: Left Heart Cath and Coronary Angiography;  Surgeon: Lyn Records, MD;  Location: [facility] INVASIVE CV LAB;  Service: Cardiovascular;  Laterality: N/A;    Family History  Problem Relation Age of Onset  . Diabetes Mellitus II Unknown   . Heart  disease Mother     Social History:  reports that he has never smoked. He has never used smokeless tobacco. He reports that he does not drink alcohol or use drugs.  Allergies:  Allergies  Allergen Reactions  . Penicillins Rash    Prior to Admission medications   Medication Sig Start Date End Date Taking? Authorizing Provider  metFORMIN (GLUCOPHAGE) 500 MG tablet Take 1 tablet (500 mg total) by mouth 2 (two) times daily with a meal. For diabetes 07/14/16  Yes Leroy Sea, MD  aspirin 81 MG chewable tablet Chew 1 tablet (81 mg total) by mouth daily. 07/13/16   Leroy Sea, MD  atorvastatin (LIPITOR) 80 MG tablet Take 1 tablet (80 mg total) by mouth daily. 07/25/16   Ok Anis, NP  Blood Glucose Monitoring Suppl (ACCU-CHEK AVIVA) device Use as instructed 08/10/16 08/10/17  Remus Loffler, PA-C  carvedilol (COREG) 3.125 MG tablet Take 1 tablet (3.125 mg total) by mouth 2 (two) times daily with a meal. 07/25/16   Ok Anis, NP  citalopram (CELEXA) 40 MG tablet Take 1 tablet (40 mg total) by mouth daily. For depression and irritability. 07/27/16   Remus Loffler, PA-C  clopidogrel (PLAVIX) 75 MG tablet Take 1 tablet (75 mg total) by mouth daily with breakfast. 07/25/16   Ok Anis, NP  empagliflozin (JARDIANCE) 10 MG TABS tablet Take 10 mg by mouth daily. 08/09/16   Henrene Pastor, PharmD  glipiZIDE (GLUCOTROL) 10 MG tablet Take 1 tablet (10 mg total) by mouth daily before breakfast. 08/17/16   Remus LofflerJones, Angel S, PA-C  glucose blood (ACCU-CHEK AVIVA) test strip Use to check BG up to bid 08/10/16   Remus LofflerJones, Angel S, PA-C  hydrOXYzine (ATARAX/VISTARIL) 10 MG tablet Take 1 tablet (10 mg total) by mouth 3 (three) times daily. 07/27/16   Remus LofflerJones, Angel S, PA-C  LANCETS MICRO THIN 33G MISC Use to check BG up to bid.  Dispense whichever lancets are needed with Accu-Check Aviva glucometer 08/10/16   Remus LofflerJones, Angel S, PA-C  losartan (COZAAR) 25 MG tablet Take 1 tablet (25 mg total) by mouth  daily. 07/25/16 10/23/16  Ok AnisBerge, Christopher R, NP  nitroGLYCERIN (NITROSTAT) 0.4 MG SL tablet Place 1 tablet (0.4 mg total) under the tongue every 5 (five) minutes as needed for chest pain. For chest pain. History of MI. 07/25/16   Ok AnisBerge, Christopher R, NP  sitaGLIPtin (JANUVIA) 50 MG tablet Take 1 tablet (50 mg total) by mouth daily. 08/17/16   Remus LofflerJones, Angel S, PA-C    Medications:  I have reviewed the patient's current medications. Scheduled: . [START ON 08/19/2016] aspirin  81 mg Oral Daily  . [START ON 08/19/2016] atorvastatin  80 mg Oral Daily  . [START ON 08/19/2016] canagliflozin  100 mg Oral QAC breakfast  . [START ON 08/19/2016] carvedilol  3.125 mg Oral BID WC  . [START ON 08/19/2016] citalopram  40 mg Oral Daily  . [START ON 08/19/2016] clopidogrel  75 mg Oral Q breakfast  . [START ON 08/19/2016] glipiZIDE  10 mg Oral QAC breakfast  . hydrOXYzine  10 mg Oral TID  . insulin aspart  0-5 Units Subcutaneous QHS  . [START ON 08/19/2016] insulin aspart  0-9 Units Subcutaneous TID WC  . levofloxacin  500 mg Oral Daily  . [START ON 08/19/2016] linagliptin  5 mg Oral Daily  . [START ON 08/19/2016] losartan  25 mg Oral Daily  . [START ON 08/19/2016] metFORMIN  500 mg Oral BID WC  . sodium chloride flush  3 mL Intravenous Q12H   Continuous: . sodium chloride     VPX:TGGYIRPRN:sodium chloride, acetaminophen **OR** acetaminophen, benzonatate, HYDROcodone-homatropine, sodium chloride flush  Review of systems: No Fever-chills, No Headache, No changes with Vision or hearing, No problems swallowing food or Liquids, No Chest pain, Shortness of Breath, + cough ,  +left ear pain, sinus pressure No Abdominal pain, No Nausea or Vommitting, Bowel movements are regular, No Blood in stool or Urine, No dysuria, No new skin rashes or bruises, No new joints pains-aches,  No new weakness, tingling, numbness in any extremity, No recent weight gain or loss, No polyuria, polydypsia or polyphagia, No significant Mental  Stressors.  Pulse 93, temperature 98.3 F (36.8 C), temperature source Oral, resp. rate 18. General appearance: alert, cooperative and no distress Head: Normocephalic, without obvious abnormality, atraumatic Eyes: conjunctivae/corneas clear. PERRL, EOM's intact. Fundi benign. Ears: Normal auricles bilateral. No drainage. Nose: Left nasal packing in place. No active bleeding. Throat: lips, mucosa, and tongue normal; teeth and gums normal Neck: no adenopathy, no carotid bruit, supple, symmetrical, trachea midline and thyroid not enlarged, symmetric, no tenderness/mass/nodules Resp: clear to auscultation bilaterally Extremities: extremities normal, atraumatic, no cyanosis or edema Skin: Skin color, texture, turgor normal. No rashes or lesions  Assessment/Plan: Left epistaxis. Patient is on aspirin and plavix due to his recent cardiac stent. Left nasal packing is in place. If no significant bleeding by tomorrow, may d/c home with the packing.  Pt may follow up at my  office next Thursday for packing removal. Will need abx coverage while packing is in place.  Martinez Boxx W Ayane Delancey 08/18/2016, 8:32 PM

## 2016-08-18 NOTE — H&P (Addendum)
TRH H&P   Patient Demographics:    [name], is a 46 y.o. male  MRN: [id number]   DOB - 10/01/1970  Admit Date - 08/18/2016  Outpatient Primary MD for the patient is Remus Loffler, PA-C  Referring MD/NP/PA:     Outpatient Specialists: Dr. Antoine Poche (cardiologist)  Patient coming [facility]: [facility]  No chief complaint on file.  Epistaxis   HPI:    [name]  is a 46 y.o. male, s Dm2, CAD s/p stent 07/09/2016, 07/11/2016,  apparently c/o epistaxis starting last nite 12 pm.  Pt presented to urgent care for ear infection at urgent care and tx to abx yesterday and then later yesterday nite developed epistaxis and therefore presented to ED at [facility] in [place], [place] for evaluation 3pm.  ED contacted cardiology regarding stopping plavix and was told to continue to aspirin and plavix and transfer patient to [facility] for evaluation by ENT.     Review of systems:    In addition to the HPI above,  No Fever-chills, No Headache, No changes with Vision or hearing, No problems swallowing food or Liquids, No Chest pain, Shortness of Breath, + cough ,  + left ear pain, sinus pressure No Abdominal pain, No Nausea or Vommitting, Bowel movements are regular, No Blood in stool or Urine, No dysuria, No new skin rashes or bruises, No new joints pains-aches,  No new weakness, tingling, numbness in any extremity, No recent weight gain or loss, No polyuria, polydypsia or polyphagia, No significant Mental Stressors.  A full 10 point Review of Systems was done, except as stated above, all other Review of Systems were negative.   With Past History of the following :    Past Medical History:  Diagnosis Date  . Alcohol abuse   . CAD (coronary artery disease), native coronary artery    a. 01/2009 s/p Anterior MI and thrombectomy with BMS to mid LAD;  b. 06/2016  Cath/PCI: LM nl, LAD 68m/d, LCX nl, OM1/2/3 nl, RDA 67m. Initial attempt @ PCI failed followed by successful CTO LAD PCI and DES (2.5x38 Synergy)-->rec for indefinte DAPT.  . CKD (chronic kidney disease), stage III   . Essential hypertension   . History of nonadherence to medical treatment   . Hyperlipidemia   . Ischemic cardiomyopathy    a. EF 40% by nuc in 2012;  b. 06/2016 Echo: EF 40-45%, apicalanteroseptal and apical AK, Gr2 DD, mod Ca2+ Ao annulus, mild MR.  [name] Slurred speech - Chronic   . Type 2 diabetes mellitus (HCC)       Past Surgical History:  Procedure Laterality Date  . CORONARY CTO INTERVENTION N/A 07/11/2016   Procedure: Coronary CTO Intervention;  Surgeon: Peter M Swaziland, MD;  Location: [facility] INVASIVE CV LAB;  Service: Cardiovascular;  Laterality: N/A;  . CORONARY STENT INTERVENTION N/A 07/09/2016   Procedure: Coronary Stent  Intervention;  Surgeon: Runell Gess, MD;  Location: [facility] INVASIVE CV LAB;  Service: Cardiovascular;  Laterality: N/A;  . Knee arthroscopic surgery Right   . LEFT HEART CATH AND CORONARY ANGIOGRAPHY N/A 07/05/2016   Procedure: Left Heart Cath and Coronary Angiography;  Surgeon: Lyn Records, MD;  Location: [facility] INVASIVE CV LAB;  Service: Cardiovascular;  Laterality: N/A;      Social History:     Social History  Substance Use Topics  . Smoking status: Never Smoker  . Smokeless tobacco: Never Used  . Alcohol use No     Lives -  At home Mobility -  Walks by self   Family History :     Family History  Problem Relation Age of Onset  . Diabetes Mellitus II Unknown   . Heart disease Mother       Home Medications:   Prior to Admission medications   Medication Sig Start Date End Date Taking? Authorizing Provider  metFORMIN (GLUCOPHAGE) 500 MG tablet Take 1 tablet (500 mg total) by mouth 2 (two) times daily with a meal. For diabetes 07/14/16  Yes Leroy Sea, MD  aspirin 81 MG chewable tablet Chew 1 tablet (81 mg total) by mouth daily.  07/13/16   Leroy Sea, MD  atorvastatin (LIPITOR) 80 MG tablet Take 1 tablet (80 mg total) by mouth daily. 07/25/16   Ok Anis, NP  Blood Glucose Monitoring Suppl (ACCU-CHEK AVIVA) device Use as instructed 08/10/16 08/10/17  Remus Loffler, PA-C  carvedilol (COREG) 3.125 MG tablet Take 1 tablet (3.125 mg total) by mouth 2 (two) times daily with a meal. 07/25/16   Ok Anis, NP  citalopram (CELEXA) 40 MG tablet Take 1 tablet (40 mg total) by mouth daily. For depression and irritability. 07/27/16   Remus Loffler, PA-C  clopidogrel (PLAVIX) 75 MG tablet Take 1 tablet (75 mg total) by mouth daily with breakfast. 07/25/16   Ok Anis, NP  empagliflozin (JARDIANCE) 10 MG TABS tablet Take 10 mg by mouth daily. 08/09/16   Henrene Pastor, PharmD  glipiZIDE (GLUCOTROL) 10 MG tablet Take 1 tablet (10 mg total) by mouth daily before breakfast. 08/17/16   Remus Loffler, PA-C  glucose blood (ACCU-CHEK AVIVA) test strip Use to check BG up to bid 08/10/16   Remus Loffler, PA-C  hydrOXYzine (ATARAX/VISTARIL) 10 MG tablet Take 1 tablet (10 mg total) by mouth 3 (three) times daily. 07/27/16   Remus Loffler, PA-C  LANCETS MICRO THIN 33G MISC Use to check BG up to bid.  Dispense whichever lancets are needed with Accu-Check Aviva glucometer 08/10/16   Remus Loffler, PA-C  losartan (COZAAR) 25 MG tablet Take 1 tablet (25 mg total) by mouth daily. 07/25/16 10/23/16  Ok Anis, NP  nitroGLYCERIN (NITROSTAT) 0.4 MG SL tablet Place 1 tablet (0.4 mg total) under the tongue every 5 (five) minutes as needed for chest pain. For chest pain. History of MI. 07/25/16   Ok Anis, NP  sitaGLIPtin (JANUVIA) 50 MG tablet Take 1 tablet (50 mg total) by mouth daily. 08/17/16   Remus Loffler, PA-C     Allergies:    No Known Allergies   Physical Exam:   Vitals  Pulse 93, temperature 98.3 F (36.8 C), temperature source Oral, resp. rate 18.   1. General  lying in bed in NAD,    2.  Normal affect and insight, Not Suicidal or Homicidal, Awake Alert, Oriented X 3.  3.  No F.N deficits, ALL C.Nerves Intact, Strength 5/5 all 4 extremities, Sensation intact all 4 extremities, Plantars down going.  4. Ears and Eyes appear Normal, Conjunctivae clear, PERRLA. Moist Oral Mucosa. , packing in left nare  5. Supple Neck, No JVD, No cervical lymphadenopathy appriciated, No Carotid Bruits.  6. Symmetrical Chest wall movement, Good air movement bilaterally, CTAB.  7. RRR, No Gallops, Rubs or Murmurs, No Parasternal Heave.  8. Positive Bowel Sounds, Abdomen Soft, No tenderness, No organomegaly appriciated,No rebound -guarding or rigidity.  9.  No Cyanosis, Normal Skin Turgor, No Skin Rash or Bruise.  10. Good muscle tone,  joints appear normal , no effusions, Normal ROM.  11. No Palpable Lymph Nodes in Neck or Axillae     Data Review:    CBC No results for input(s): WBC, HGB, HCT, PLT, MCV, MCH, MCHC, RDW, LYMPHSABS, MONOABS, EOSABS, BASOSABS, BANDABS in the last 168 hours.  Invalid input(s): NEUTRABS, BANDSABD ------------------------------------------------------------------------------------------------------------------  Chemistries  No results for input(s): NA, K, CL, CO2, GLUCOSE, BUN, CREATININE, CALCIUM, MG, AST, ALT, ALKPHOS, BILITOT in the last 168 hours.  Invalid input(s): GFRCGP ------------------------------------------------------------------------------------------------------------------ estimated creatinine clearance is 66.9 mL/min (A) (by C-G formula based on SCr of 1.35 mg/dL (H)). ------------------------------------------------------------------------------------------------------------------ No results for input(s): TSH, T4TOTAL, T3FREE, THYROIDAB in the last 72 hours.  Invalid input(s): FREET3  Coagulation profile No results for input(s): INR, PROTIME in the last 168  hours. ------------------------------------------------------------------------------------------------------------------- No results for input(s): DDIMER in the last 72 hours. -------------------------------------------------------------------------------------------------------------------  Cardiac Enzymes No results for input(s): CKMB, TROPONINI, MYOGLOBIN in the last 168 hours.  Invalid input(s): CK ------------------------------------------------------------------------------------------------------------------ No results found for: BNP   ---------------------------------------------------------------------------------------------------------------  Urinalysis No results found for: COLORURINE, APPEARANCEUR, LABSPEC, PHURINE, GLUCOSEU, HGBUR, BILIRUBINUR, KETONESUR, PROTEINUR, UROBILINOGEN, NITRITE, LEUKOCYTESUR  ----------------------------------------------------------------------------------------------------------------   Imaging Results:    No results found.     Assessment & Plan:    Active Problems:   * No active hospital problems. *    1. [facility] Consult ENT, appreciate input Cbc in am  2. CAD s/p stent Continue aspirin, plavix, carvedilol, losartan, lipitor  3. DM2 fsbs ac and qhs Continue metformin, continue Januvia, continue jardiance  4. Depression Continue citalopram  5. Mild renal insufficiency Check cmp in am  6. Sinus pressure, L ear pain CT brain noncontrast ordered Augmentin 875mg  po bid  7.  Neck twitching per pt family,  Please inquire in AM and if still occurring please consult neurology,   DVT Prophylaxis SCD  AM Labs Ordered, also please review Full Orders  Family Communication: Admission, patients condition and plan of care including tests being ordered have been discussed with the patient  who indicate understanding and agree with the plan and Code Status.  Code Status FULL CODE  Likely DC to   home  Condition GUARDED    Consults called: ENT  Admission status:  observation    Time spent in minutes : 45 minutes   Pearson GrippeJames Dede Dobesh M.D on 08/18/2016 at 7:18 PM  Between 7am to 7pm - Pager - [phone] After 7pm go to [email or web address] - password [facility]  [facility] - Office  [phone]

## 2016-08-19 ENCOUNTER — Encounter (HOSPITAL_COMMUNITY): Payer: Self-pay

## 2016-08-19 ENCOUNTER — Observation Stay (HOSPITAL_COMMUNITY): Payer: Medicaid Other

## 2016-08-19 DIAGNOSIS — E118 Type 2 diabetes mellitus with unspecified complications: Secondary | ICD-10-CM | POA: Diagnosis not present

## 2016-08-19 DIAGNOSIS — I2583 Coronary atherosclerosis due to lipid rich plaque: Secondary | ICD-10-CM | POA: Diagnosis not present

## 2016-08-19 DIAGNOSIS — R040 Epistaxis: Secondary | ICD-10-CM | POA: Diagnosis not present

## 2016-08-19 DIAGNOSIS — I2510 Atherosclerotic heart disease of native coronary artery without angina pectoris: Secondary | ICD-10-CM | POA: Diagnosis not present

## 2016-08-19 LAB — CBC
HEMATOCRIT: 38 % — AB (ref 39.0–52.0)
HEMOGLOBIN: 12.5 g/dL — AB (ref 13.0–17.0)
MCH: 26.8 pg (ref 26.0–34.0)
MCHC: 32.9 g/dL (ref 30.0–36.0)
MCV: 81.4 fL (ref 78.0–100.0)
Platelets: 224 10*3/uL (ref 150–400)
RBC: 4.67 MIL/uL (ref 4.22–5.81)
RDW: 13.5 % (ref 11.5–15.5)
WBC: 10.7 10*3/uL — ABNORMAL HIGH (ref 4.0–10.5)

## 2016-08-19 LAB — COMPREHENSIVE METABOLIC PANEL
ALT: 20 U/L (ref 17–63)
ANION GAP: 10 (ref 5–15)
AST: 22 U/L (ref 15–41)
Albumin: 3.7 g/dL (ref 3.5–5.0)
Alkaline Phosphatase: 60 U/L (ref 38–126)
BILIRUBIN TOTAL: 0.5 mg/dL (ref 0.3–1.2)
BUN: 18 mg/dL (ref 6–20)
CALCIUM: 8.7 mg/dL — AB (ref 8.9–10.3)
CO2: 25 mmol/L (ref 22–32)
Chloride: 101 mmol/L (ref 101–111)
Creatinine, Ser: 1.54 mg/dL — ABNORMAL HIGH (ref 0.61–1.24)
GFR calc Af Amer: >60 mL/min (ref >60)
GFR, EST NON AFRICAN AMERICAN: 53 mL/min — AB (ref >60)
Glucose, Bld: 149 mg/dL — ABNORMAL HIGH (ref 65–99)
POTASSIUM: 4.2 mmol/L (ref 3.5–5.1)
Sodium: 136 mmol/L (ref 135–145)
TOTAL PROTEIN: 6.9 g/dL (ref 6.5–8.1)

## 2016-08-19 LAB — GLUCOSE, CAPILLARY: GLUCOSE-CAPILLARY: 143 mg/dL — AB (ref 65–99)

## 2016-08-19 MED ORDER — CLINDAMYCIN HCL 300 MG PO CAPS: 300.0000 mg | ORAL_CAPSULE | Freq: Three times a day (TID) | ORAL | 0 refills | Status: AC

## 2016-08-19 MED ORDER — BENZONATATE 100 MG PO CAPS: 100.0000 mg | ORAL_CAPSULE | Freq: Three times a day (TID) | ORAL | 0 refills | Status: DC | PRN

## 2016-08-19 NOTE — Progress Notes (Signed)
Subjective: No more bleeding overnight.   Objective: Vital signs in last 24 hours: Temp:  [97.1 F (36.2 C)-98.3 F (36.8 C)] 98.3 F (36.8 C) (05/27 0446) Pulse Rate:  [85-98] 85 (05/27 0446) Resp:  [18] 18 (05/27 0446) BP: (116-131)/(84-97) 116/84 (05/27 0446) SpO2:  [97 %-98 %] 98 % (05/27 0446)  Physical Exam: General appearance: alert, cooperative and no distress Head: Normocephalic, without obvious abnormality, atraumatic Eyes: conjunctivae/corneas clear. PERRL, EOM's intact.  Ears: Normal auricles bilateral. No drainage. Cerumen. Nose: Left nasal packing in place. No active bleeding. Throat: lips, mucosa, and tongue normal; teeth and gums normal Neck: no adenopathy, no carotid bruit, supple, symmetrical, trachea midline and thyroid not enlarged, symmetric, no tenderness/mass/nodules Resp: clear to auscultation bilaterally Extremities: extremities normal, atraumatic, no cyanosis or edema Skin: Skin color, texture, turgor normal. No rashes or lesions  Recent Labs  08/19/16 0458  WBC 10.7*  HGB 12.5*  HCT 38.0*  PLT 224    Recent Labs  08/19/16 0458  NA 136  K 4.2  CL 101  CO2 25  GLUCOSE 149*  BUN 18  CREATININE 1.54*  CALCIUM 8.7*    Medications:  I have reviewed the patient's current medications. Scheduled: . aspirin  81 mg Oral Daily  . atorvastatin  80 mg Oral Daily  . canagliflozin  100 mg Oral QAC breakfast  . carvedilol  3.125 mg Oral BID WC  . citalopram  40 mg Oral Daily  . clopidogrel  75 mg Oral Q breakfast  . glipiZIDE  10 mg Oral QAC breakfast  . hydrOXYzine  10 mg Oral TID  . insulin aspart  0-5 Units Subcutaneous QHS  . insulin aspart  0-9 Units Subcutaneous TID WC  . levofloxacin  500 mg Oral Daily  . linagliptin  5 mg Oral Daily  . losartan  25 mg Oral Daily  . metFORMIN  500 mg Oral BID WC  . sodium chloride flush  3 mL Intravenous Q12H   Continuous: . sodium chloride     ZOX:WRUEAVPRN:sodium chloride, acetaminophen **OR**  acetaminophen, benzonatate, HYDROcodone-homatropine, sodium chloride flush  Assessment/Plan: Left epistaxis. Patient is on aspirin and plavix due to his recent cardiac stent. Left nasal packing is in place. No evidence of otitis media or otitis externa.  May d/c home with the packing. Pt may follow up at my [place] office next Thursday for packing removal. Will need abx coverage (e.g. Clindamycin 300mg  TID x5days) while packing is in place.   LOS: 0 days   Genene Kilman W Rozelle Caudle 08/19/2016, 8:42 AM

## 2016-08-19 NOTE — Progress Notes (Signed)
Pt has no complaints. Left nasal packing intact, no bleeding noted. Denies pain. Discharge instructions given to pt, verbalized understanding. Discharged to home accompanied by family.

## 2016-08-19 NOTE — Discharge Summary (Signed)
Physician Discharge Summary  [name] UJW:[id number] DOB: 1970/05/15 DOA: 08/18/2016  PCP: Remus Loffler, PA-C  Admit date: 08/18/2016 Discharge date: 08/19/2016  Admitted From: Home  Disposition:  Home   Recommendations for Outpatient Follow-up:  1. Follow up with PCP in 1- weeks 2. Follow with ENT. Dr. Suszanne Conners on May 31st for nasal packing removal 3. Antibiotic therapy with clindamycin for 5 days.   Home Health: No  Equipment/Devices:   Discharge Condition: Stable  CODE STATUS: Full  Diet recommendation: Heart Healthy / Carb Modified    Brief/Interim Summary: This is a 46 year old male who presented to hospital with chief complaint of epistaxis. Patient is known to have coronary disease status post stenting April 16 and April 18 this year. Had acute onset of epistaxis, severe in intensity, patient initially seen at [facility], transferred to [facility] for ENT evaluation. On initial physical examination, patient's heart rate 93, temperature 98.3, respiratory 18, blood pressure normal. His mucous membranes were moist, he had a nasal packing on the left nostril. Lungs wete clear to auscultation, heart S1-S2 present rhythmic, abdomen soft, no lower extremity edema. Sodium 136, potassium 4.2, chloride 101, bicarbonate 25, glucose 149, BUN 18, creatinine 1.5, white count 10.7, hemoglobin 12.5, hematocrit 38.0, platelets 224. Head CT negative for acute findings.   Patient admitted with working diagnosis of severe epistaxis in the setting off use of dual antiplatelet therapy.  1. Epistaxis. Patient was seen by ENT, no further bleeding, recommendation to continue nasal packing until May 31. Antibiotic therapy with clindamycin for 5 days. Continue dual antiplatelet therapy.  2. Coronary artery disease.  Stable, no chest pain, patient will continue aspirin, Plavix, Coreg, losartan and Lipitor as part of medical therapy.  3. Diabetes mellitus. Patient will continue S medications:  Include metformin, glipizide, linagliptin and Januvia. Capillary glucose 234 and 143. Leukopenia toleration.  4. Chronic kidney disease stage 2. Calculated GFR 66. His creatinine has been ranged between 1.3 and 1.5. Continue losartan, follow-up as an outpatient.    Discharge Diagnoses:  Active Problems:   Epistaxis    Discharge Instructions   Allergies as of 08/19/2016      Reactions   Penicillins Rash   Has patient had a PCN reaction causing immediate rash, facial/tongue/throat swelling, SOB or lightheadedness with hypotension: Yes Has patient had a PCN reaction causing severe rash involving mucus membranes or skin necrosis: Yes Has patient had a PCN reaction that required hospitalization: No Has patient had a PCN reaction occurring within the last 10 years: Yes If all of the above answers are "NO", then may proceed with Cephalosporin use.      Medication List    STOP taking these medications   ACCU-CHEK AVIVA device   glucose blood test strip Commonly known as:  ACCU-CHEK AVIVA   LANCETS MICRO THIN 33G Misc     TAKE these medications   aspirin 81 MG chewable tablet Chew 1 tablet (81 mg total) by mouth daily.   atorvastatin 80 MG tablet Commonly known as:  LIPITOR Take 1 tablet (80 mg total) by mouth daily.   benzonatate 100 MG capsule Commonly known as:  TESSALON Take 1 capsule (100 mg total) by mouth 3 (three) times daily as needed for cough.   carvedilol 3.125 MG tablet Commonly known as:  COREG Take 1 tablet (3.125 mg total) by mouth 2 (two) times daily with a meal.   citalopram 40 MG tablet Commonly known as:  CELEXA Take 1 tablet (40 mg total) by  mouth daily. For depression and irritability.   clindamycin 300 MG capsule Commonly known as:  CLEOCIN Take 1 capsule (300 mg total) by mouth 3 (three) times daily.   clopidogrel 75 MG tablet Commonly known as:  PLAVIX Take 1 tablet (75 mg total) by mouth daily with breakfast.   empagliflozin 10 MG Tabs  tablet Commonly known as:  JARDIANCE Take 10 mg by mouth daily.   glipiZIDE 10 MG tablet Commonly known as:  GLUCOTROL Take 1 tablet (10 mg total) by mouth daily before breakfast.   hydrOXYzine 10 MG tablet Commonly known as:  ATARAX/VISTARIL Take 1 tablet (10 mg total) by mouth 3 (three) times daily.   losartan 25 MG tablet Commonly known as:  COZAAR Take 1 tablet (25 mg total) by mouth daily.   metFORMIN 500 MG tablet Commonly known as:  GLUCOPHAGE Take 1 tablet (500 mg total) by mouth 2 (two) times daily with a meal. For diabetes   nitroGLYCERIN 0.4 MG SL tablet Commonly known as:  NITROSTAT Place 1 tablet (0.4 mg total) under the tongue every 5 (five) minutes as needed for chest pain. For chest pain. History of MI.   sitaGLIPtin 50 MG tablet Commonly known as:  JANUVIA Take 1 tablet (50 mg total) by mouth daily.       Allergies  Allergen Reactions  . Penicillins Rash    Has patient had a PCN reaction causing immediate rash, facial/tongue/throat swelling, SOB or lightheadedness with hypotension: Yes Has patient had a PCN reaction causing severe rash involving mucus membranes or skin necrosis: Yes Has patient had a PCN reaction that required hospitalization: No Has patient had a PCN reaction occurring within the last 10 years: Yes If all of the above answers are "NO", then may proceed with Cephalosporin use.     Consultations:  ENT   Procedures/Studies: Ct Head Wo Contrast  Result Date: 08/19/2016 CLINICAL DATA:  Epistaxis, LEFT ear pain. History of hypertension, diabetes. EXAM: CT HEAD WITHOUT CONTRAST TECHNIQUE: Contiguous axial images were obtained from the base of the skull through the vertex without intravenous contrast. COMPARISON:  CT HEAD July 10, 2016 FINDINGS: BRAIN: No intraparenchymal hemorrhage, mass effect nor midline shift. The ventricles and sulci are normal. No acute large vascular territory infarcts. No abnormal extra-axial fluid collections.  Basal cisterns are patent. VASCULAR: Trace calcific atherosclerosis of the carotid siphon. SKULL/SOFT TISSUES: No skull fracture. No significant soft tissue swelling. ORBITS/SINUSES: The included ocular globes and orbital contents are normal.Mild paranasal sinus mucosal thickening without air-fluid levels. Mastoid air cells are well aerated. Soft tissue within the external auditory canals compatible with cerumen. OTHER: None. IMPRESSION: Early atherosclerosis, otherwise negative CT HEAD for age. Electronically Signed   By: Awilda Metro M.D.   On: 08/19/2016 03:48       Subjective: Patient with no chest pain or dyspnea, no nasal pain or further bleeding.  Discharge Exam: Vitals:   08/18/16 2054 08/19/16 0446  BP: (!) 131/97 116/84  Pulse: 98 85  Resp: 18 18  Temp: 97.1 F (36.2 C) 98.3 F (36.8 C)   Vitals:   08/18/16 1807 08/18/16 2054 08/19/16 0446  BP:  (!) 131/97 116/84  Pulse: 93 98 85  Resp: 18 18 18   Temp: 98.3 F (36.8 C) 97.1 F (36.2 C) 98.3 F (36.8 C)  TempSrc: Oral Oral Oral  SpO2:  97% 98%    General: Pt is alert, awake, not in acute distress\ Left nostril with packing, no signs of bleeding Cardiovascular: RRR, S1/S2 +,  no rubs, no gallops Respiratory: CTA bilaterally, no wheezing, no rhonchi Abdominal: Soft, NT, ND, bowel sounds + Extremities: no edema, no cyanosis    The results of significant diagnostics from this hospitalization (including imaging, microbiology, ancillary and laboratory) are listed below for reference.     Microbiology: No results found for this or any previous visit (from the past 240 hour(s)).   Labs: BNP (last 3 results) No results for input(s): BNP in the last 8760 hours. Basic Metabolic Panel:  Recent Labs Lab 08/19/16 0458  NA 136  K 4.2  CL 101  CO2 25  GLUCOSE 149*  BUN 18  CREATININE 1.54*  CALCIUM 8.7*   Liver Function Tests:  Recent Labs Lab 08/19/16 0458  AST 22  ALT 20  ALKPHOS 60  BILITOT 0.5   PROT 6.9  ALBUMIN 3.7   No results for input(s): LIPASE, AMYLASE in the last 168 hours. No results for input(s): AMMONIA in the last 168 hours. CBC:  Recent Labs Lab 08/19/16 0458  WBC 10.7*  HGB 12.5*  HCT 38.0*  MCV 81.4  PLT 224   Cardiac Enzymes: No results for input(s): CKTOTAL, CKMB, CKMBINDEX, TROPONINI in the last 168 hours. BNP: Invalid input(s): POCBNP CBG:  Recent Labs Lab 08/18/16 2127 08/19/16 0745  GLUCAP 234* 143*   D-Dimer No results for input(s): DDIMER in the last 72 hours. Hgb A1c No results for input(s): HGBA1C in the last 72 hours. Lipid Profile No results for input(s): CHOL, HDL, LDLCALC, TRIG, CHOLHDL, LDLDIRECT in the last 72 hours. Thyroid function studies No results for input(s): TSH, T4TOTAL, T3FREE, THYROIDAB in the last 72 hours.  Invalid input(s): FREET3 Anemia work up No results for input(s): VITAMINB12, FOLATE, FERRITIN, TIBC, IRON, RETICCTPCT in the last 72 hours. Urinalysis No results found for: COLORURINE, APPEARANCEUR, LABSPEC, PHURINE, GLUCOSEU, HGBUR, BILIRUBINUR, KETONESUR, PROTEINUR, UROBILINOGEN, NITRITE, LEUKOCYTESUR Sepsis Labs Invalid input(s): PROCALCITONIN,  WBC,  LACTICIDVEN Microbiology No results found for this or any previous visit (from the past 240 hour(s)).   Time coordinating discharge: 45 minutes  SIGNED:   Coralie KeensMauricio Daniel Brandilee Pies, MD  [facility] 08/19/2016, 10:34 AM Pager   If 7PM-7AM, please contact night-coverage [email or web address] Password [email or web address]

## 2016-08-20 ENCOUNTER — Telehealth: Payer: Self-pay | Admitting: Internal Medicine

## 2016-08-20 NOTE — Telephone Encounter (Signed)
[facility]. Patient presented with maroon stools - No gross GI bleeding. Had recently had epistaxis, [facility]. Remains on ASA and Plavix - recently PCI of CTO to the LAD with DES just over 1 month ago. H/H stable. I would advised given the recent stent to continue both aspirin and plavix for now - however, if he develops active GI hemorrhage and/or hemodynamic instability or s/s of progressive anemia, his antiplatelets would need to be held. I would recommend GI evaluation. Will route this to his primary cardiologist for review.  Chrystie NoseKenneth C. Brynlee Pennywell, MD, [facility]stone[facility]ACC  [place]  [facility]  Attending Cardiologist  Direct Dial: [phone]  Fax: [phone]  Website:  [email or web address]

## 2016-08-21 ENCOUNTER — Telehealth: Payer: Self-pay | Admitting: Cardiology

## 2016-08-21 DIAGNOSIS — K921 Melena: Secondary | ICD-10-CM

## 2016-08-21 LAB — HEMOGLOBIN A1C
HEMOGLOBIN A1C: 7.3 % — AB (ref 4.8–5.6)
Mean Plasma Glucose: 163 mg/dL

## 2016-08-21 NOTE — Telephone Encounter (Signed)
New message     Pt is having rectal bleeding can he stop the aspirin regimen until he see doctor

## 2016-08-21 NOTE — Telephone Encounter (Signed)
I have advised pt's wife of plan & placement of referral order, she voiced thanks.

## 2016-08-21 NOTE — Telephone Encounter (Signed)
Pt of Dr. Antoine PocheHochrein Recent cath w PCI, on ASA and plavix.  Returned call, spoke to wife who identifies concern that patient has been passing blood in stool. Per note by Dr. Rennis GoldenHilty, this was identified and [facility] yesterday who saw patient for an ED evaluation for same. Reviewed Dr. Blanchie DessertHilty's recommendations w wife. Pt instructed to remain on medication regimen unless symptoms worse. I did ask her to call if symptoms are worse and she's aware I will place a referral for urgent GI evaluation as I don't see if this was ordered.  Routed to Dr. Antoine PocheHochrein for further advice.

## 2016-08-21 NOTE — Telephone Encounter (Signed)
I agree with the need for GI evaluation.

## 2016-08-23 ENCOUNTER — Ambulatory Visit (INDEPENDENT_AMBULATORY_CARE_PROVIDER_SITE_OTHER): Payer: Medicaid Other | Admitting: Otolaryngology

## 2016-08-23 ENCOUNTER — Encounter: Payer: Self-pay | Admitting: Physician Assistant

## 2016-08-23 DIAGNOSIS — H6123 Impacted cerumen, bilateral: Secondary | ICD-10-CM

## 2016-08-23 DIAGNOSIS — R040 Epistaxis: Secondary | ICD-10-CM

## 2016-08-28 ENCOUNTER — Ambulatory Visit: Payer: Medicaid Other | Admitting: Physician Assistant

## 2016-08-28 ENCOUNTER — Other Ambulatory Visit (INDEPENDENT_AMBULATORY_CARE_PROVIDER_SITE_OTHER): Payer: Medicaid Other

## 2016-08-28 ENCOUNTER — Encounter: Payer: Self-pay | Admitting: Physician Assistant

## 2016-08-28 ENCOUNTER — Ambulatory Visit (INDEPENDENT_AMBULATORY_CARE_PROVIDER_SITE_OTHER): Payer: Medicaid Other | Admitting: Physician Assistant

## 2016-08-28 VITALS — BP 110/74 | HR 72 | Ht 68.0 in | Wt 169.4 lb

## 2016-08-28 DIAGNOSIS — D649 Anemia, unspecified: Secondary | ICD-10-CM

## 2016-08-28 DIAGNOSIS — K921 Melena: Secondary | ICD-10-CM

## 2016-08-28 DIAGNOSIS — Z87898 Personal history of other specified conditions: Secondary | ICD-10-CM | POA: Diagnosis not present

## 2016-08-28 LAB — CBC WITH DIFFERENTIAL/PLATELET
BASOS ABS: 0 10*3/uL (ref 0.0–0.1)
Basophils Relative: 0.4 % (ref 0.0–3.0)
EOS PCT: 1.6 % (ref 0.0–5.0)
Eosinophils Absolute: 0.2 10*3/uL (ref 0.0–0.7)
HEMATOCRIT: 38.7 % — AB (ref 39.0–52.0)
HEMOGLOBIN: 13.1 g/dL (ref 13.0–17.0)
LYMPHS PCT: 19.5 % (ref 12.0–46.0)
Lymphs Abs: 2.3 10*3/uL (ref 0.7–4.0)
MCHC: 33.8 g/dL (ref 30.0–36.0)
MCV: 80.6 fl (ref 78.0–100.0)
MONOS PCT: 8.5 % (ref 3.0–12.0)
Monocytes Absolute: 1 10*3/uL (ref 0.1–1.0)
Neutro Abs: 8.4 10*3/uL — ABNORMAL HIGH (ref 1.4–7.7)
Neutrophils Relative %: 70 % (ref 43.0–77.0)
Platelets: 266 10*3/uL (ref 150.0–400.0)
RBC: 4.8 Mil/uL (ref 4.22–5.81)
RDW: 14.9 % (ref 11.5–15.5)
WBC: 12 10*3/uL — ABNORMAL HIGH (ref 4.0–10.5)

## 2016-08-28 NOTE — Progress Notes (Deleted)
Cardiology Office Note    Date:  08/28/2016   ID:  [name], DOB 11-May-1970, MRN [id number]  PCP:  Remus Loffler, PA-C  Cardiologist:  Dr. Antoine Poche  No chief complaint on file.   History of Present Illness:  [name] is a 46 y.o. male with PMH of HTN, HLD, ICM with baseline EF 40-45%, DM, CKD stage III, h/o noncompliance, and CAD s/p anterior STEMI in 01/2009 with BMS to LAD. He was lost to follow-up since 2012. He was admitted in April 2018 with chest pain. Diagnostic cardiac catheterization at that time showed severe in-stent restenosis in the previously placed LAD stent. Initial attempt at PCI was unsuccessful, after review with our interventional CTO team, he eventually underwent successful DES within the LAD stent. His hospital course was complicated by development of aspiration pneumonia requiring antibiotic therapy. He was seen by Geoffry Paradise NP on 07/25/2016, he was doing well at the time. Basic metabolic panel obtained that showed stable renal function. Fasting lipid panel and LFT was planned in 6 weeks. He was admitted and the transfer to [facility] on 08/18/2016 after presented to [facility] him in [place] with epistaxis. He was treated with left nasal packing. He was discharged on 08/19/2016, unfortunately he went to [facility] rocking him ER again the following day with maroon colored stool. He was referred for GI evaluation.  No EKG   Past Medical History:  Diagnosis Date  . Alcohol abuse   . CAD (coronary artery disease), native coronary artery    a. 01/2009 s/p Anterior MI and thrombectomy with BMS to mid LAD;  b. 06/2016 Cath/PCI: LM nl, LAD 25m/d, LCX nl, OM1/2/3 nl, RDA 9m. Initial attempt @ PCI failed followed by successful CTO LAD PCI and DES (2.5x38 Synergy)-->rec for indefinte DAPT.  . CKD (chronic kidney disease), stage III   . Essential hypertension   . History of nonadherence to medical treatment   . Hyperlipidemia   . Ischemic cardiomyopathy    a. EF 40% by  nuc in 2012;  b. 06/2016 Echo: EF 40-45%, apicalanteroseptal and apical AK, Gr2 DD, mod Ca2+ Ao annulus, mild MR.  [name] Slurred speech - Chronic   . Type 2 diabetes mellitus (HCC)     Past Surgical History:  Procedure Laterality Date  . CORONARY CTO INTERVENTION N/A 07/11/2016   Procedure: Coronary CTO Intervention;  Surgeon: Peter M Swaziland, MD;  Location: [facility] INVASIVE CV LAB;  Service: Cardiovascular;  Laterality: N/A;  . CORONARY STENT INTERVENTION N/A 07/09/2016   Procedure: Coronary Stent Intervention;  Surgeon: Runell Gess, MD;  Location: [facility] INVASIVE CV LAB;  Service: Cardiovascular;  Laterality: N/A;  . Knee arthroscopic surgery Right   . LEFT HEART CATH AND CORONARY ANGIOGRAPHY N/A 07/05/2016   Procedure: Left Heart Cath and Coronary Angiography;  Surgeon: Lyn Records, MD;  Location: [facility] INVASIVE CV LAB;  Service: Cardiovascular;  Laterality: N/A;    Current Medications: Outpatient Medications Prior to Visit  Medication Sig Dispense Refill  . aspirin 81 MG chewable tablet Chew 1 tablet (81 mg total) by mouth daily. 30 tablet 3  . atorvastatin (LIPITOR) 80 MG tablet Take 1 tablet (80 mg total) by mouth daily. 90 tablet 3  . benzonatate (TESSALON) 100 MG capsule Take 1 capsule (100 mg total) by mouth 3 (three) times daily as needed for cough. 20 capsule 0  . carvedilol (COREG) 3.125 MG tablet Take 1 tablet (3.125 mg total) by mouth 2 (two) times daily with a  meal. 180 tablet 3  . citalopram (CELEXA) 40 MG tablet Take 1 tablet (40 mg total) by mouth daily. For depression and irritability. 30 tablet 5  . clopidogrel (PLAVIX) 75 MG tablet Take 1 tablet (75 mg total) by mouth daily with breakfast. 90 tablet 3  . empagliflozin (JARDIANCE) 10 MG TABS tablet Take 10 mg by mouth daily. 34 tablet 0  . glipiZIDE (GLUCOTROL) 10 MG tablet Take 1 tablet (10 mg total) by mouth daily before breakfast. 90 tablet 1  . hydrOXYzine (ATARAX/VISTARIL) 10 MG tablet Take 1 tablet (10 mg total) by mouth 3  (three) times daily. 90 tablet 5  . losartan (COZAAR) 25 MG tablet Take 1 tablet (25 mg total) by mouth daily. 90 tablet 3  . metFORMIN (GLUCOPHAGE) 500 MG tablet Take 1 tablet (500 mg total) by mouth 2 (two) times daily with a meal. For diabetes 60 tablet 0  . nitroGLYCERIN (NITROSTAT) 0.4 MG SL tablet Place 1 tablet (0.4 mg total) under the tongue every 5 (five) minutes as needed for chest pain. For chest pain. History of MI. 25 tablet 2  . sitaGLIPtin (JANUVIA) 50 MG tablet Take 1 tablet (50 mg total) by mouth daily. 90 tablet 1   No facility-administered medications prior to visit.      Allergies:   Penicillins   Social History   Social History  . Marital status: Married    Spouse name: N/A  . Number of children: N/A  . Years of education: N/A   Social History Main Topics  . Smoking status: Never Smoker  . Smokeless tobacco: Never Used  . Alcohol use No  . Drug use: No  . Sexual activity: Yes   Other Topics Concern  . Not on file   Social History Narrative   Married with one child   Lives in [place], [place]     Family History:  The patient's ***family history includes Heart disease in his mother.   ROS:   Please see the history of present illness.    ROS All other systems reviewed and are negative.   PHYSICAL EXAM:   VS:  There were no vitals taken for this visit.   GEN: Well nourished, well developed, in no acute distress  HEENT: normal  Neck: no JVD, carotid bruits, or masses Cardiac: ***RRR; no murmurs, rubs, or gallops,no edema  Respiratory:  clear to auscultation bilaterally, normal work of breathing GI: soft, nontender, nondistended, + BS MS: no deformity or atrophy  Skin: warm and dry, no rash Neuro:  Alert and Oriented x 3, Strength and sensation are intact Psych: euthymic mood, full affect  Wt Readings from Last 3 Encounters:  08/09/16 171 lb (77.6 kg)  07/27/16 170 lb 12.8 oz (77.5 kg)  07/25/16 171 lb (77.6 kg)      Studies/Labs Reviewed:    EKG:  EKG is*** ordered today.  The ekg ordered today demonstrates ***  Recent Labs: 08/19/2016: ALT 20; BUN 18; Creatinine, Ser 1.54; Hemoglobin 12.5; Platelets 224; Potassium 4.2; Sodium 136   Lipid Panel    Component Value Date/Time   CHOL 146 07/27/2016 1039   TRIG 170 (H) 07/27/2016 1039   HDL 62 07/27/2016 1039   CHOLHDL 2.4 07/27/2016 1039   CHOLHDL 2.1 07/06/2016 0534   VLDL 19 07/06/2016 0534   LDLCALC 50 07/27/2016 1039    Additional studies/ records that were reviewed today include:   Echo 07/07/2016 LV EF: 40% -   45%  ------------------------------------------------------------------- Indications:  CAD of native vessels 414.01.  ------------------------------------------------------------------- History:   PMH:   Angina pectoris.  Risk factors:  Hypertension. Diabetes mellitus. Dyslipidemia.  ------------------------------------------------------------------- Study Conclusions  - Left ventricle: The cavity size was normal. Wall thickness was   normal. Systolic function was mildly to moderately reduced. The   estimated ejection fraction was in the range of 40% to 45%.   Akinesis of the apicalanteroseptal and apical myocardium.   Features are consistent with a pseudonormal left ventricular   filling pattern, with concomitant abnormal relaxation and   increased filling pressure (grade 2 diastolic dysfunction). - Aortic valve: Moderately calcified annulus. - Mitral valve: There was mild regurgitation.   Cath 07/05/2016 Conclusion     Mid RCA lesion, 60 %stenosed.  Mid LAD to Dist LAD lesion, 99 %stenosed.  The left ventricular ejection fraction is 35-45% by visual estimate.    Diffuse in-stent restenosis, 99%, of the stent placed in the mid LAD beyond the second diagonal and first septal perforator. The has features that suggest total occlusion.   50-60% mid RCA stenosis.  Small, widely patent circumflex system.  Distal anterior wall and  apical akinesis. LVEF 40%  RECOMMENDATIONS:   Unable to safely attempt PCI on the LAD due to difficulty with guide catheter engagement and stability.  Consider PCI from the femoral approach in a.m. versus revascularization via the CTO team. Will review images. Tentatively scheduled for right femoral PCI tomorrow. I have not yet given dual antiplatelet therapy.     Cath 07/09/2016 IMPRESSION: Unsuccessful attempt at proximal LAD PCI and re-intervention of a stent ISR CTO for unstable angina. We will discontinue the heparin, removed the sheath and hold pressure and hydrate the patient. He remains on total of 5 therapy. I will review his angiogram since my colleagues perform CTO interventions. The patient could have his diagonal branch ostium stented.   Cath 07/11/2016 Conclusion     Mid LAD to Dist LAD lesion, 99 %stenosed with in stent CTO  A STENT SYNERGY DES 2.5X38 drug eluting stent was successfully placed.  Post intervention, there is a 0% residual stenosis.   1. Successful CTO PCI of the mid LAD with DES  Plan: DAPT for at least one year. Consider DAPT indefinitely given 2 layers of stent in mid LAD. Aggressive risk factor modification. Anticipate DC in am if no complications.     ASSESSMENT:    No diagnosis found.   PLAN:  In order of problems listed above:  1. ***    Medication Adjustments/Labs and Tests Ordered: Current medicines are reviewed at length with the patient today.  Concerns regarding medicines are outlined above.  Medication changes, Labs and Tests ordered today are listed in the Patient Instructions below. There are no Patient Instructions on file for this visit.   Ramond DialSigned, Labrandon Knoch, [place]  08/28/2016 6:29 AM    [facility] [address], [place], [place]  [address] Phone: [phone]; Fax: [phone]

## 2016-08-28 NOTE — Progress Notes (Signed)
Chief Complaint: Hematochezia, anemia  HPI:  [name] is a 46 year old Caucasian male with a past medical history of alcohol abuse, chronic slurred speech, type 2 diabetes, ischemic cardiomyopathy (EF 40-45% on 07/07/16), CAD status post stent placement on 07/11/16 maintained on Plavix, CKD stage III and others listed below, who was referred to me by Remus Loffler, PA-C for a complaint of hematochezia and anemia.      Per chart review patient was recently admitted to the hospital from 08/18/16-08/19/16 for epistaxis. Patient had a CBC at time of discharge showing a hemoglobin of 12.5. His nasal packing was left in and he was told to follow with an ENT in 2 days to remove this.   Today, the patient presented to clinic accompanied by his mother. He is a very poor historian and details are hard to understand. The patient tells me that he had a nosebleed as above and did start to see some "maroon-colored stool with some bright red blood" on 5/27. This was apparently 2 days after the bleeding had initially started. At first, the patient tells me this was a "dark/black blood", but his mother prods him and tells him that she was told this was "bright red", it appears the patient is uncertain. He tells me he had 4-5 stools with blood associated which stopped after 2-1/2 days. Since May 30, he has had no further bloody bowel movements. He tells me he has been having regular formed stools every day since then. Patient did have some repeatnose of bleeding about 2 days after discharge when packing was removed by ENT, but this stopped on its own later that evening. Associated symptoms due include some increased weakness and dizziness with change in position since the bleeding occurred.   Patient denies fever, chills, change in bowel habits, weight loss, anorexia, nausea, vomiting, heartburn, reflux or abdominal pain.  Past Medical History:  Diagnosis Date  . Alcohol abuse   . CAD (coronary artery disease), native  coronary artery    a. 01/2009 s/p Anterior MI and thrombectomy with BMS to mid LAD;  b. 06/2016 Cath/PCI: LM nl, LAD 60m/d, LCX nl, OM1/2/3 nl, RDA 45m. Initial attempt @ PCI failed followed by successful CTO LAD PCI and DES (2.5x38 Synergy)-->rec for indefinte DAPT.  . CKD (chronic kidney disease), stage III   . Essential hypertension   . History of nonadherence to medical treatment   . Hyperlipidemia   . Ischemic cardiomyopathy    a. EF 40% by nuc in 2012;  b. 06/2016 Echo: EF 40-45%, apicalanteroseptal and apical AK, Gr2 DD, mod Ca2+ Ao annulus, mild MR.  [name] Slurred speech - Chronic   . Type 2 diabetes mellitus (HCC)     Past Surgical History:  Procedure Laterality Date  . CORONARY CTO INTERVENTION N/A 07/11/2016   Procedure: Coronary CTO Intervention;  Surgeon: Peter M Swaziland, MD;  Location: [facility] INVASIVE CV LAB;  Service: Cardiovascular;  Laterality: N/A;  . CORONARY STENT INTERVENTION N/A 07/09/2016   Procedure: Coronary Stent Intervention;  Surgeon: Runell Gess, MD;  Location: [facility] INVASIVE CV LAB;  Service: Cardiovascular;  Laterality: N/A;  . Knee arthroscopic surgery Right   . LEFT HEART CATH AND CORONARY ANGIOGRAPHY N/A 07/05/2016   Procedure: Left Heart Cath and Coronary Angiography;  Surgeon: Lyn Records, MD;  Location: [facility] INVASIVE CV LAB;  Service: Cardiovascular;  Laterality: N/A;    Current Outpatient Prescriptions  Medication Sig Dispense Refill  . aspirin 81 MG chewable tablet Chew 1 tablet (  81 mg total) by mouth daily. 30 tablet 3  . atorvastatin (LIPITOR) 80 MG tablet Take 1 tablet (80 mg total) by mouth daily. 90 tablet 3  . carvedilol (COREG) 3.125 MG tablet Take 1 tablet (3.125 mg total) by mouth 2 (two) times daily with a meal. 180 tablet 3  . citalopram (CELEXA) 40 MG tablet Take 1 tablet (40 mg total) by mouth daily. For depression and irritability. 30 tablet 5  . clopidogrel (PLAVIX) 75 MG tablet Take 1 tablet (75 mg total) by mouth daily with breakfast. 90 tablet  3  . empagliflozin (JARDIANCE) 10 MG TABS tablet Take 10 mg by mouth daily. 34 tablet 0  . glipiZIDE (GLUCOTROL) 10 MG tablet Take 1 tablet (10 mg total) by mouth daily before breakfast. 90 tablet 1  . hydrOXYzine (ATARAX/VISTARIL) 10 MG tablet Take 1 tablet (10 mg total) by mouth 3 (three) times daily. 90 tablet 5  . losartan (COZAAR) 25 MG tablet Take 1 tablet (25 mg total) by mouth daily. 90 tablet 3  . nitroGLYCERIN (NITROSTAT) 0.4 MG SL tablet Place 1 tablet (0.4 mg total) under the tongue every 5 (five) minutes as needed for chest pain. For chest pain. History of MI. 25 tablet 2  . sitaGLIPtin (JANUVIA) 50 MG tablet Take 1 tablet (50 mg total) by mouth daily. 90 tablet 1  . tranexamic acid (LYSTEDA) 650 MG TABS tablet Take 1,300 mg by mouth 2 (two) times daily.     No current facility-administered medications for this visit.     Allergies as of 08/28/2016 - Review Complete 08/28/2016  Allergen Reaction Noted  . Penicillins Rash 08/18/2016    Family History  Problem Relation Age of Onset  . Diabetes Mellitus II Unknown   . Heart disease Mother     Social History   Social History  . Marital status: Married    Spouse name: N/A  . Number of children: N/A  . Years of education: N/A   Occupational History  . Not on file.   Social History Main Topics  . Smoking status: Never Smoker  . Smokeless tobacco: Never Used  . Alcohol use No  . Drug use: No  . Sexual activity: Yes   Other Topics Concern  . Not on file   Social History Narrative   Married with one child   [place]    Review of Systems:    Constitutional: No weight loss, fever or chills Skin: No rash  Cardiovascular: No chest pain Respiratory: No SOB Gastrointestinal: See HPI and otherwise negative Genitourinary: No dysuria Neurological: Positive for positional dizziness Musculoskeletal: No new muscle or joint pain Hematologic: No bruising Psychiatric: No history of depression or anxiety    Physical Exam:  Vital signs: BP 110/74   Pulse 72   Ht 5\' 8"  (1.727 m)   Wt 169 lb 6.4 oz (76.8 kg)   BMI 25.76 kg/m   Constitutional: Caucasian male appears to be in NAD, Well developed, Well nourished, alert and cooperative Head:  Normocephalic and atraumatic. Eyes:   PEERL, EOMI. No icterus. Conjunctiva pink. Ears:  Normal auditory acuity. Neck:  Supple Throat: Oral cavity and pharynx without inflammation, swelling or lesion.  Respiratory: Respirations even and unlabored. Lungs clear to auscultation bilaterally.   No wheezes, crackles, or rhonchi.  Cardiovascular: Normal S1, S2. No MRG. Regular rate and rhythm. No peripheral edema, cyanosis or pallor.  Gastrointestinal:  Soft, nondistended, nontender. No rebound or guarding. Normal bowel sounds. No appreciable masses or  hepatomegaly. Rectal:  Not performed.  Msk:  Symmetrical without gross deformities. Without edema, no deformity or joint abnormality.  Neurologic:  Alert and  oriented x4;  grossly normal neurologically. Slurred Speech Skin:   Dry and intact without significant lesions or rashes. Psychiatric: Demonstrates good judgement and reason. Short term memory impairment.  RELEVANT LABS AND IMAGING: CBC    Component Value Date/Time   WBC 10.7 (H) 08/19/2016 0458   RBC 4.67 08/19/2016 0458   HGB 12.5 (L) 08/19/2016 0458   HCT 38.0 (L) 08/19/2016 0458   HCT 42.0 07/27/2016 1039   PLT 224 08/19/2016 0458   PLT 268 07/27/2016 1039   MCV 81.4 08/19/2016 0458   MCV 81 07/27/2016 1039   MCH 26.8 08/19/2016 0458   MCHC 32.9 08/19/2016 0458   RDW 13.5 08/19/2016 0458   RDW 14.3 07/27/2016 1039   LYMPHSABS 1.4 07/27/2016 1039   MONOABS 0.6 08/20/2011 2006   EOSABS 0.3 07/27/2016 1039   BASOSABS 0.1 07/27/2016 1039    CMP     Component Value Date/Time   NA 136 08/19/2016 0458   NA 140 08/09/2016 1024   K 4.2 08/19/2016 0458   CL 101 08/19/2016 0458   CO2 25 08/19/2016 0458   GLUCOSE 149 (H) 08/19/2016 0458   BUN  18 08/19/2016 0458   BUN 14 08/09/2016 1024   CREATININE 1.54 (H) 08/19/2016 0458   CREATININE 1.30 07/25/2016 1123   CALCIUM 8.7 (L) 08/19/2016 0458   PROT 6.9 08/19/2016 0458   ALBUMIN 3.7 08/19/2016 0458   AST 22 08/19/2016 0458   ALT 20 08/19/2016 0458   ALKPHOS 60 08/19/2016 0458   BILITOT 0.5 08/19/2016 0458   GFRNONAA 53 (L) 08/19/2016 0458   GFRAA >60 08/19/2016 0458    Assessment: 1. Hematochezia: This is thought likely related to recent epistaxis, as timing seems to correspond with this and patient does tell me stools were dark at first, low concern for other source of GI bleeding as patient has returned to normal stools, discussed this in detail today 2. Anemia: Hemoglobin 07/27/1812.1, then 12.5 on 08/19/16 after epistaxis for which the patient was admitted to the hospital, likely this is related  Plan: 1. Discussed with the patient and his family today that I have a strong suspicion that the hematochezia that he saw was related to epistaxis as this was a large amount of blood which likely ended up in his stomach. This also correspond very well with the timing of his epistaxis. Patient did have 4-5 "dark" stools about 1-2 days after nose bleeding, which stopped within 2-1/2 days. Patient also is at very high risk to take him off of his blood thinners at this time with recent stent placement in April. Taking all this in mind we will not rush to proceed with endoscopic procedures as the patient has had normal stools since that time. 2. Ordered repeat CBC today. If hemoglobin continues to trend downward, may need to investigate further. 3. Patient to follow in clinic as recommended after labs above and/or as needed for any further bleeding with Dr. Adela Lank or myself  Hyacinth Meeker, PA-C [facility] 08/28/2016, 11:45 AM  Cc: Remus Loffler, PA-C

## 2016-08-28 NOTE — Patient Instructions (Signed)
Your physician has requested that you go to the basement for the following lab work before leaving today: CBC  

## 2016-08-29 ENCOUNTER — Telehealth: Payer: Self-pay

## 2016-08-29 NOTE — Telephone Encounter (Signed)
-----   Message from [place] CenterJennifer Lynne [place], [place] sent at 08/29/2016  8:29 AM EDT ----- Regarding: Please schedule pt for repeat OV in 2-3 months Per Dr. Adela LankArmbruster we should check on pt in 2-3 months and ensure he has had no bleeding. Can you make appt with me or him, thanks-JLL  ----- Message ----- From: Ruffin FrederickArmbruster, Steven Paul, MD Sent: 08/29/2016   7:42 AM To: Unk LightningJennifer Lynne Lemmon, PA    ----- Message ----- From: Unk LightningLemmon, Jennifer Lynne, PA Sent: 08/28/2016  11:58 AM To: Ruffin FrederickSteven Paul Armbruster, MD

## 2016-08-29 NOTE — Telephone Encounter (Signed)
Follow up appt with Dr Adela LankArmbruster on 10/30/16 at 1:30 pm. Pt has been notified and pt verbalized understanding

## 2016-08-29 NOTE — Progress Notes (Signed)
Agree with assessment and plan as outlined.  GI blood loss almost certainly related to significant epistaxis as outlined. Agree with repeating CBC and monitoring for recurrent bleeding. He had a coronary stent placed last month and is in need for Plavix, not a good candidate for endoscopy at this time in this light unless he has ongoing active bleeding or progressive anemia. Will trend CBC, he should follow up in a few months to ensure he is doing okay. He should avoid all NSAIDs

## 2016-09-05 ENCOUNTER — Encounter: Payer: Self-pay | Admitting: Physician Assistant

## 2016-09-05 ENCOUNTER — Ambulatory Visit (INDEPENDENT_AMBULATORY_CARE_PROVIDER_SITE_OTHER): Payer: Medicaid Other | Admitting: Physician Assistant

## 2016-09-05 VITALS — BP 142/90 | HR 78 | Ht 69.0 in | Wt 174.0 lb

## 2016-09-05 DIAGNOSIS — N183 Chronic kidney disease, stage 3 unspecified: Secondary | ICD-10-CM

## 2016-09-05 DIAGNOSIS — I2510 Atherosclerotic heart disease of native coronary artery without angina pectoris: Secondary | ICD-10-CM | POA: Diagnosis not present

## 2016-09-05 DIAGNOSIS — I255 Ischemic cardiomyopathy: Secondary | ICD-10-CM

## 2016-09-05 DIAGNOSIS — E785 Hyperlipidemia, unspecified: Secondary | ICD-10-CM | POA: Diagnosis not present

## 2016-09-05 DIAGNOSIS — E119 Type 2 diabetes mellitus without complications: Secondary | ICD-10-CM

## 2016-09-05 DIAGNOSIS — I10 Essential (primary) hypertension: Secondary | ICD-10-CM

## 2016-09-05 MED ORDER — CARVEDILOL 6.25 MG PO TABS: 6.2500 mg | ORAL_TABLET | Freq: Two times a day (BID) | ORAL | 1 refills | Status: DC

## 2016-09-05 NOTE — Patient Instructions (Signed)
Your physician has recommended you make the following change in your medication:  -- INCREASE carvedilol to 6.25mg  twice daily -- make sure you are taking plavix 75mg  daily   Your physician wants you to follow-up in: 3-4 months with Dr. Antoine PocheHochrein. You will receive a reminder letter in the mail two months in advance. If you don't receive a letter, please call our office to schedule the follow-up appointment.

## 2016-09-05 NOTE — Progress Notes (Signed)
Cardiology Office Note    Date:  09/05/2016   ID:  [name], DOB [birth year], MRN [id number]  PCP:  Remus Loffler, PA-C  Cardiologist:  Dr. Antoine Poche  Chief Complaint  Patient presents with  . Follow-up    seen for Dr. Antoine Poche.     History of Present Illness:  [name] is a 46 y.o. male with PMH of CAD, HTN, HLD, DM, CKD stage III, ICM and noncompliance. He has a history of alcohol abuse as well. He had anterior MI in November 2010 treated with thrombectomy and bare metal stent to mid LAD. He was lost to follow-up since 2012, however recently admitted in April 2018 with chest pain. He underwent diagnostic cardiac catheterization which revealed severe in-stent restenosis in the previously placed LAD stent. Initial attempt at PCI was unsuccessful. This was reviewed by our CTO team, he eventually underwent successful PCI and DES placement of LAD lesion. Echocardiogram obtained on 07/07/2016 showed EF 40-45%, grade 2 diastolic dysfunction, akinesis of the apical anteroseptal and apical myocardium, mild MR. Given the stent placement, he was placed on aspirin and Plavix. Unfortunately recently he went to the hospital on 08/18/2016 with profuse left epistaxis. He was seen by ENT who placed after nasal packing.  Since he left the hospital, he has not had any further bleeding issue. He denies any recent chest pain, he has no lower extremity edema, orthopnea or PND. He is still compliant with aspirin and Plavix. He says he never stopped the aspirin and Plavix even with the nosebleed.   Past Medical History:  Diagnosis Date  . Alcohol abuse   . CAD (coronary artery disease), native coronary artery    a. 01/2009 s/p Anterior MI and thrombectomy with BMS to mid LAD;  b. 06/2016 Cath/PCI: LM nl, LAD 31m/d, LCX nl, OM1/2/3 nl, RDA 44m. Initial attempt @ PCI failed followed by successful CTO LAD PCI and DES (2.5x38 Synergy)-->rec for indefinte DAPT.  . CKD (chronic kidney disease), stage III   .  Essential hypertension   . History of nonadherence to medical treatment   . Hyperlipidemia   . Ischemic cardiomyopathy    a. EF 40% by nuc in 2012;  b. 06/2016 Echo: EF 40-45%, apicalanteroseptal and apical AK, Gr2 DD, mod Ca2+ Ao annulus, mild MR.  [name] Slurred speech - Chronic   . Type 2 diabetes mellitus (HCC)     Past Surgical History:  Procedure Laterality Date  . CORONARY CTO INTERVENTION N/A 07/11/2016   Procedure: Coronary CTO Intervention;  Surgeon: Peter M Swaziland, MD;  Location: [facility] INVASIVE CV LAB;  Service: Cardiovascular;  Laterality: N/A;  . CORONARY STENT INTERVENTION N/A 07/09/2016   Procedure: Coronary Stent Intervention;  Surgeon: Runell Gess, MD;  Location: [facility] INVASIVE CV LAB;  Service: Cardiovascular;  Laterality: N/A;  . Knee arthroscopic surgery Right   . LEFT HEART CATH AND CORONARY ANGIOGRAPHY N/A 07/05/2016   Procedure: Left Heart Cath and Coronary Angiography;  Surgeon: Lyn Records, MD;  Location: [facility] INVASIVE CV LAB;  Service: Cardiovascular;  Laterality: N/A;    Current Medications: Outpatient Medications Prior to Visit  Medication Sig Dispense Refill  . aspirin 81 MG chewable tablet Chew 1 tablet (81 mg total) by mouth daily. 30 tablet 3  . atorvastatin (LIPITOR) 80 MG tablet Take 1 tablet (80 mg total) by mouth daily. 90 tablet 3  . citalopram (CELEXA) 40 MG tablet Take 1 tablet (40 mg total) by mouth daily. For depression and irritability. 30 tablet  5  . clopidogrel (PLAVIX) 75 MG tablet Take 1 tablet (75 mg total) by mouth daily with breakfast. 90 tablet 3  . empagliflozin (JARDIANCE) 10 MG TABS tablet Take 10 mg by mouth daily. 34 tablet 0  . glipiZIDE (GLUCOTROL) 10 MG tablet Take 1 tablet (10 mg total) by mouth daily before breakfast. 90 tablet 1  . hydrOXYzine (ATARAX/VISTARIL) 10 MG tablet Take 1 tablet (10 mg total) by mouth 3 (three) times daily. 90 tablet 5  . losartan (COZAAR) 25 MG tablet Take 1 tablet (25 mg total) by mouth daily. 90 tablet 3  .  nitroGLYCERIN (NITROSTAT) 0.4 MG SL tablet Place 1 tablet (0.4 mg total) under the tongue every 5 (five) minutes as needed for chest pain. For chest pain. History of MI. 25 tablet 2  . sitaGLIPtin (JANUVIA) 50 MG tablet Take 1 tablet (50 mg total) by mouth daily. 90 tablet 1  . carvedilol (COREG) 3.125 MG tablet Take 1 tablet (3.125 mg total) by mouth 2 (two) times daily with a meal. 180 tablet 3  . tranexamic acid (LYSTEDA) 650 MG TABS tablet Take 1,300 mg by mouth 2 (two) times daily.     No facility-administered medications prior to visit.      Allergies:   Penicillins   Social History   Social History  . Marital status: Married    Spouse name: N/A  . Number of children: N/A  . Years of education: N/A   Social History Main Topics  . Smoking status: Never Smoker  . Smokeless tobacco: Never Used  . Alcohol use No  . Drug use: No  . Sexual activity: Yes   Other Topics Concern  . None   Social History Narrative   Married with one child   Lives in [place], [place]     Family History:  The patient's family history includes Heart disease in his mother.   ROS:   Please see the history of present illness.    ROS All other systems reviewed and are negative.   PHYSICAL EXAM:   VS:  BP (!) 142/90   Pulse 78   Ht 5\' 9"  (1.753 m)   Wt 174 lb (78.9 kg)   BMI 25.70 kg/m    GEN: Well nourished, well developed, in no acute distress  HEENT: normal  Neck: no JVD, carotid bruits, or masses Cardiac: RRR; no murmurs, rubs, or gallops,no edema  Respiratory:  clear to auscultation bilaterally, normal work of breathing GI: soft, nontender, nondistended, + BS MS: no deformity or atrophy  Skin: warm and dry, no rash Neuro:  Alert and Oriented x 3, Strength and sensation are intact Psych: euthymic mood, full affect  Wt Readings from Last 3 Encounters:  09/05/16 174 lb (78.9 kg)  08/28/16 169 lb 6.4 oz (76.8 kg)  08/09/16 171 lb (77.6 kg)      Studies/Labs Reviewed:   EKG:  EKG is  not ordered today.    Recent Labs: 08/19/2016: ALT 20; BUN 18; Creatinine, Ser 1.54; Potassium 4.2; Sodium 136 08/28/2016: Hemoglobin 13.1; Platelets 266.0   Lipid Panel    Component Value Date/Time   CHOL 146 07/27/2016 1039   TRIG 170 (H) 07/27/2016 1039   HDL 62 07/27/2016 1039   CHOLHDL 2.4 07/27/2016 1039   CHOLHDL 2.1 07/06/2016 0534   VLDL 19 07/06/2016 0534   LDLCALC 50 07/27/2016 1039    Additional studies/ records that were reviewed today include:   Cath 07/05/2016 Conclusion     Mid RCA lesion,  60 %stenosed.  Mid LAD to Dist LAD lesion, 99 %stenosed.  The left ventricular ejection fraction is 35-45% by visual estimate.    Diffuse in-stent restenosis, 99%, of the stent placed in the mid LAD beyond the second diagonal and first septal perforator. The has features that suggest total occlusion.   50-60% mid RCA stenosis.  Small, widely patent circumflex system.  Distal anterior wall and apical akinesis. LVEF 40%  RECOMMENDATIONS:   Unable to safely attempt PCI on the LAD due to difficulty with guide catheter engagement and stability.  Consider PCI from the femoral approach in a.m. versus revascularization via the CTO team. Will review images. Tentatively scheduled for right femoral PCI tomorrow. I have not yet given dual antiplatelet therapy.      Echo 07/07/2016 LV EF: 40% -   45%  Study Conclusions  - Left ventricle: The cavity size was normal. Wall thickness was   normal. Systolic function was mildly to moderately reduced. The   estimated ejection fraction was in the range of 40% to 45%.   Akinesis of the apicalanteroseptal and apical myocardium.   Features are consistent with a pseudonormal left ventricular   filling pattern, with concomitant abnormal relaxation and   increased filling pressure (grade 2 diastolic dysfunction). - Aortic valve: Moderately calcified annulus. - Mitral valve: There was mild regurgitation.   Cath  07/09/2016  IMPRESSION: Unsuccessful attempt at proximal LAD PCI and re-intervention of a stent ISR CTO for unstable angina. We will discontinue the heparin, removed the sheath and hold pressure and hydrate the patient. He remains on total of 5 therapy. I will review his angiogram since my colleagues perform CTO interventions. The patient could have his diagonal branch ostium stented.    Cath 07/11/2016 Conclusion     Mid LAD to Dist LAD lesion, 99 %stenosed with in stent CTO  A STENT SYNERGY DES 2.5X38 drug eluting stent was successfully placed.  Post intervention, there is a 0% residual stenosis.   1. Successful CTO PCI of the mid LAD with DES  Plan: DAPT for at least one year. Consider DAPT indefinitely given 2 layers of stent in mid LAD. Aggressive risk factor modification. Anticipate DC in am if no complications.     ASSESSMENT:    1. Coronary artery disease involving native coronary artery of native heart without angina pectoris   2. Essential hypertension   3. Hyperlipidemia, unspecified hyperlipidemia type   4. Controlled type 2 diabetes mellitus without complication, without long-term current use of insulin (HCC)   5. CKD (chronic kidney disease), stage III   6. Ischemic cardiomyopathy      PLAN:  In order of problems listed above:  1. CAD: No obvious chest discomfort recently. He has been compliant with aspirin and Plavix. His epistaxis has completely resolved. He did not have any discontinuation of aspirin and Plavix during the event.  2. Hypertension: Blood pressure is mildly elevated today, however previous office visit a few days ago showed normal blood pressure. I did increase his carvedilol to 6.25 mg twice a day. We'll need to continue to monitor blood pressure.  3. Hyperlipidemia: On Lipitor 80 mg daily  4. DM II: On oral diabetes medication  5. CKD stage III: Renal function stable  6. Ischemic cardiomyopathy: No obvious sign of heart failure on  physical exam.    Medication Adjustments/Labs and Tests Ordered: Current medicines are reviewed at length with the patient today.  Concerns regarding medicines are outlined above.  Medication changes, Labs and  Tests ordered today are listed in the Patient Instructions below. Patient Instructions  Your physician has recommended you make the following change in your medication:  -- INCREASE carvedilol to 6.25mg  twice daily -- make sure you are taking plavix 75mg  daily   Your physician wants you to follow-up in: 3-4 months with Dr. Antoine Poche. You will receive a reminder letter in the mail two months in advance. If you don't receive a letter, please call our office to schedule the follow-up appointment.     Ramond Dial, [place]  09/05/2016 11:24 PM    [facility] [address] [place], [place], [place]  [address] Phone: [phone]; Fax: [phone]

## 2016-09-11 ENCOUNTER — Other Ambulatory Visit: Payer: Self-pay | Admitting: Physician Assistant

## 2016-09-17 ENCOUNTER — Ambulatory Visit (INDEPENDENT_AMBULATORY_CARE_PROVIDER_SITE_OTHER): Payer: Medicaid Other | Admitting: Otolaryngology

## 2016-09-17 DIAGNOSIS — R040 Epistaxis: Secondary | ICD-10-CM | POA: Diagnosis not present

## 2016-09-18 ENCOUNTER — Ambulatory Visit (INDEPENDENT_AMBULATORY_CARE_PROVIDER_SITE_OTHER): Payer: Medicaid Other | Admitting: Pharmacist

## 2016-09-18 ENCOUNTER — Encounter: Payer: Self-pay | Admitting: Pharmacist

## 2016-09-18 VITALS — BP 142/87 | HR 72 | Ht 69.0 in | Wt 176.0 lb

## 2016-09-18 DIAGNOSIS — D72829 Elevated white blood cell count, unspecified: Secondary | ICD-10-CM | POA: Diagnosis not present

## 2016-09-18 DIAGNOSIS — E1165 Type 2 diabetes mellitus with hyperglycemia: Secondary | ICD-10-CM | POA: Diagnosis not present

## 2016-09-18 DIAGNOSIS — IMO0001 Reserved for inherently not codable concepts without codable children: Secondary | ICD-10-CM

## 2016-09-18 LAB — CBC WITH DIFFERENTIAL/PLATELET
Basophils Absolute: 0 10*3/uL (ref 0.0–0.2)
Basos: 0 %
EOS (ABSOLUTE): 0.2 10*3/uL (ref 0.0–0.4)
EOS: 2 %
Hematocrit: 41.5 % (ref 37.5–51.0)
Hemoglobin: 14.1 g/dL (ref 13.0–17.7)
IMMATURE GRANULOCYTES: 1 %
Immature Grans (Abs): 0.1 10*3/uL (ref 0.0–0.1)
Lymphocytes Absolute: 2 10*3/uL (ref 0.7–3.1)
Lymphs: 21 %
MCH: 28 pg (ref 26.6–33.0)
MCHC: 34 g/dL (ref 31.5–35.7)
MCV: 82 fL (ref 79–97)
MONOS ABS: 1.1 10*3/uL — AB (ref 0.1–0.9)
Monocytes: 12 %
Neutrophils Absolute: 6.1 10*3/uL (ref 1.4–7.0)
Neutrophils: 64 %
PLATELETS: 245 10*3/uL (ref 150–379)
RBC: 5.04 x10E6/uL (ref 4.14–5.80)
RDW: 14.9 % (ref 12.3–15.4)
WBC: 9.5 10*3/uL (ref 3.4–10.8)

## 2016-09-18 LAB — BMP8+EGFR
BUN/Creatinine Ratio: 9 (ref 9–20)
BUN: 14 mg/dL (ref 6–24)
CALCIUM: 9.6 mg/dL (ref 8.7–10.2)
CO2: 25 mmol/L (ref 20–29)
CREATININE: 1.5 mg/dL — AB (ref 0.76–1.27)
Chloride: 101 mmol/L (ref 96–106)
GFR calc Af Amer: 64 mL/min/{1.73_m2} (ref >59)
GFR calc non Af Amer: 55 mL/min/{1.73_m2} — ABNORMAL LOW (ref >59)
Glucose: 82 mg/dL (ref 65–99)
Potassium: 4.6 mmol/L (ref 3.5–5.2)
Sodium: 142 mmol/L (ref 134–144)

## 2016-09-18 NOTE — Progress Notes (Signed)
Patient ID: [name], male   DOB: 08-09-1970, 46 y.o.   MRN: [id number]   Subjective:    [name] is a 46 y.o. male who presents for follow up evaluation of type 2 DM. [name] was recently hospitalized for CP and a coronary stent restenosis about 2 months ago.  He has done well post hospitalization with the exception of having a server epistaxis event about 1 month ago requiring ER visit.  He saw ENT yesterday for cauterization yesterday.   In that last few months his serum creatinine has been slightly elevated and CMP on 08/19/2016 also showed hypocalcemia.  His most recent eGFR was 53 (08/19/2016)  Current diabetic medications include:  Pioglitazone 2m qd;  januvia 54mqd and glipizide 1037md.  Metformin no longer on patient's home list but was suppose to be taking per our list  Current monitoring regimen: Checking twice a day - morning and evening Home blood sugar records: am fasting BG = 130's; in the evening 140's  Denies any HBG reading over 200 Any episodes of hypoglycemia? no  Known diabetic complications: nephropathy and cardiovascular disease Cardiovascular risk factors: advanced age (older than 55 24r men, 65 14r women), diabetes mellitus, dyslipidemia, hypertension, male gender and sedentary lifestyle Eye exam current (within one year): no but has appt for July 2nd, 2018 Weight trend: increased by 2# Prior visit with CDE: yes Current diet: in general, a "healthy" diet  , on average, 1-2 meals per day, diabetic Current exercise: walking - 3 to 4 times per week for about 30 to 40 minutes Medication Compliance?  No - not sure why patient is not taking metformin   Is He on ACE inhibitor or angiotensin II receptor blocker?  Yes   losartan (Cozaar)   Objective:    BP (!) 142/87   Pulse 72   Ht _0  (1.753 m)   Wt 176 lb (79.8 kg)   BMI 25.99 kg/m    A1c = 7.3% (08/19/2016) A1c = 7.4% (07/27/2016)  BMET    Component Value Date/Time   NA 136 08/19/2016  0458   NA 140 08/09/2016 1024   K 4.2 08/19/2016 0458   CL 101 08/19/2016 0458   CO2 25 08/19/2016 0458   GLUCOSE 149 (H) 08/19/2016 0458   BUN 18 08/19/2016 0458   BUN 14 08/09/2016 1024   CREATININE 1.54 (H) 08/19/2016 0458   CREATININE 1.30 07/25/2016 1123   CALCIUM 8.7 (L) 08/19/2016 0458   GFRNONAA 53 (L) 08/19/2016 0458   GFRAA >60 08/19/2016 0458       Assessment:    Diabetes Mellitus type 2 - under improving control.   Elevated serum creatinine Elevated WBC   Plan:    1.  Rx changes:     Continue Jardiance 71m37mke 1 tablet daily , Januvia 50mg89mand glipizide 71mg 52m  If GFR still over 45 with labs checked today then will restart metformin 500mg b85m2.  Education: Reviewed 'ABCs' of diabetes management (respective goals in parentheses):  A1C (<7), blood pressure (<130/80), and cholesterol (LDL <100). Reminded that eye exam is due and given number to several area optometrists.. 4. Continue CHO counting diet  5.  Recommend check BG 2  times a day 6.  Recommended increase physical activity - goal is 150 minutes per week 7.   Orders Placed This Encounter  Procedures  . BMP8+EGFR  . CBC with Differential/Platelet  . Microalbumin / creatinine urine ratio    8.  Follow up: 8 weeks  with PCP

## 2016-09-19 ENCOUNTER — Encounter: Payer: Self-pay | Admitting: Physician Assistant

## 2016-09-19 ENCOUNTER — Ambulatory Visit (INDEPENDENT_AMBULATORY_CARE_PROVIDER_SITE_OTHER): Payer: Medicaid Other | Admitting: Physician Assistant

## 2016-09-19 VITALS — BP 138/96 | HR 89 | Temp 98.7°F | Ht 69.0 in | Wt 174.4 lb

## 2016-09-19 DIAGNOSIS — R059 Cough, unspecified: Secondary | ICD-10-CM

## 2016-09-19 DIAGNOSIS — J029 Acute pharyngitis, unspecified: Secondary | ICD-10-CM | POA: Diagnosis not present

## 2016-09-19 DIAGNOSIS — R05 Cough: Secondary | ICD-10-CM

## 2016-09-19 LAB — CULTURE, GROUP A STREP

## 2016-09-19 LAB — MICROALBUMIN / CREATININE URINE RATIO
Creatinine, Urine: 72.1 mg/dL
Microalb/Creat Ratio: 20.2 mg/g creat (ref 0.0–30.0)
Microalbumin, Urine: 14.6 ug/mL

## 2016-09-19 LAB — RAPID STREP SCREEN (MED CTR MEBANE ONLY): Strep Gp A Ag, IA W/Reflex: NEGATIVE

## 2016-09-19 MED ORDER — HYDROCODONE-HOMATROPINE 5-1.5 MG/5ML PO SYRP: 5.0000 mL | ORAL_SOLUTION | Freq: Four times a day (QID) | ORAL | 0 refills | Status: DC | PRN

## 2016-09-19 MED ORDER — AZITHROMYCIN 250 MG PO TABS
ORAL_TABLET | ORAL | 0 refills | Status: DC
Start: 2016-09-19 — End: 2016-10-30

## 2016-09-19 NOTE — Progress Notes (Signed)
BP (!) 138/96   Pulse 89   Temp 98.7 F (37.1 C) (Oral)   Ht 5\' 9"  (1.753 m)   Wt 174 lb 6.4 oz (79.1 kg)   BMI 25.75 kg/m    Subjective:    Patient ID: [name], male    DOB: 1970/11/29, 46 y.o.   MRN: [id number]  HPI: [name] is a 46 y.o. male presenting on 09/19/2016 for Sore Throat and Cough  This patient has had less than 2 days mild fever, chills, myalgias.  Complains of sinus headache and postnasal drainage. There is copious drainage at times. Associated sore throat. Pain with swallowing, decreased appetite and headache.  Exposure to strep.  Relevant past medical, surgical, family and social history reviewed and updated as indicated. Allergies and medications reviewed and updated.  Past Medical History:  Diagnosis Date  . Alcohol abuse   . CAD (coronary artery disease), native coronary artery    a. 01/2009 s/p Anterior MI and thrombectomy with BMS to mid LAD;  b. 06/2016 Cath/PCI: LM nl, LAD 11m/d, LCX nl, OM1/2/3 nl, RDA 82m. Initial attempt @ PCI failed followed by successful CTO LAD PCI and DES (2.5x38 Synergy)-->rec for indefinte DAPT.  . CKD (chronic kidney disease), stage III   . Essential hypertension   . History of nonadherence to medical treatment   . Hyperlipidemia   . Ischemic cardiomyopathy    a. EF 40% by nuc in 2012;  b. 06/2016 Echo: EF 40-45%, apicalanteroseptal and apical AK, Gr2 DD, mod Ca2+ Ao annulus, mild MR.  [name] Slurred speech - Chronic   . Type 2 diabetes mellitus (HCC)     Past Surgical History:  Procedure Laterality Date  . CORONARY CTO INTERVENTION N/A 07/11/2016   Procedure: Coronary CTO Intervention;  Surgeon: Peter M Swaziland, MD;  Location: [facility] INVASIVE CV LAB;  Service: Cardiovascular;  Laterality: N/A;  . CORONARY STENT INTERVENTION N/A 07/09/2016   Procedure: Coronary Stent Intervention;  Surgeon: Runell Gess, MD;  Location: [facility] INVASIVE CV LAB;  Service: Cardiovascular;  Laterality: N/A;  . Knee arthroscopic surgery Right   .  LEFT HEART CATH AND CORONARY ANGIOGRAPHY N/A 07/05/2016   Procedure: Left Heart Cath and Coronary Angiography;  Surgeon: Lyn Records, MD;  Location: [facility] INVASIVE CV LAB;  Service: Cardiovascular;  Laterality: N/A;    Review of Systems  Constitutional: Positive for fatigue and fever. Negative for appetite change.  HENT: Positive for congestion, sinus pressure and sore throat.   Eyes: Negative.  Negative for pain and visual disturbance.  Respiratory: Positive for cough. Negative for chest tightness, shortness of breath and wheezing.   Cardiovascular: Negative.  Negative for chest pain, palpitations and leg swelling.  Gastrointestinal: Negative.  Negative for abdominal pain, diarrhea, nausea and vomiting.  Endocrine: Negative.   Genitourinary: Negative.   Musculoskeletal: Negative for back pain and myalgias.  Skin: Negative.  Negative for color change and rash.  Neurological: Positive for headaches. Negative for weakness and numbness.  Psychiatric/Behavioral: Negative.     Allergies as of 09/19/2016      Reactions   Penicillins Rash   Has patient had a PCN reaction causing immediate rash, facial/tongue/throat swelling, SOB or lightheadedness with hypotension: Yes Has patient had a PCN reaction causing severe rash involving mucus membranes or skin necrosis: Yes Has patient had a PCN reaction that required hospitalization: No Has patient had a PCN reaction occurring within the last 10 years: Yes If all of the above answers are "NO", then may proceed  with Cephalosporin use.      Medication List       Accurate as of 09/19/16  1:41 PM. Always use your most recent med list.          aspirin 81 MG chewable tablet Chew 1 tablet (81 mg total) by mouth daily.   atorvastatin 80 MG tablet Commonly known as:  LIPITOR Take 1 tablet (80 mg total) by mouth daily.   azithromycin 250 MG tablet Commonly known as:  ZITHROMAX Z-PAK Take as directed   carvedilol 6.25 MG tablet Commonly known  as:  COREG Take 1 tablet (6.25 mg total) by mouth 2 (two) times daily with a meal.   citalopram 40 MG tablet Commonly known as:  CELEXA Take 1 tablet (40 mg total) by mouth daily. For depression and irritability.   clopidogrel 75 MG tablet Commonly known as:  PLAVIX Take 1 tablet (75 mg total) by mouth daily with breakfast.   empagliflozin 10 MG Tabs tablet Commonly known as:  JARDIANCE Take 10 mg by mouth daily.   glipiZIDE 10 MG tablet Commonly known as:  GLUCOTROL Take 1 tablet (10 mg total) by mouth daily before breakfast.   HYDROcodone-homatropine 5-1.5 MG/5ML syrup Commonly known as:  HYCODAN Take 5-10 mLs by mouth every 6 (six) hours as needed.   hydrOXYzine 10 MG tablet Commonly known as:  ATARAX/VISTARIL Take 1 tablet (10 mg total) by mouth 3 (three) times daily.   losartan 25 MG tablet Commonly known as:  COZAAR Take 1 tablet (25 mg total) by mouth daily.   nitroGLYCERIN 0.4 MG SL tablet Commonly known as:  NITROSTAT Place 1 tablet (0.4 mg total) under the tongue every 5 (five) minutes as needed for chest pain. For chest pain. History of MI.   sitaGLIPtin 50 MG tablet Commonly known as:  JANUVIA Take 1 tablet (50 mg total) by mouth daily.          Objective:    BP (!) 138/96   Pulse 89   Temp 98.7 F (37.1 C) (Oral)   Ht 5\' 9"  (1.753 m)   Wt 174 lb 6.4 oz (79.1 kg)   BMI 25.75 kg/m   Allergies  Allergen Reactions  . Penicillins Rash    Has patient had a PCN reaction causing immediate rash, facial/tongue/throat swelling, SOB or lightheadedness with hypotension: Yes Has patient had a PCN reaction causing severe rash involving mucus membranes or skin necrosis: Yes Has patient had a PCN reaction that required hospitalization: No Has patient had a PCN reaction occurring within the last 10 years: Yes If all of the above answers are "NO", then may proceed with Cephalosporin use.     Physical Exam  Constitutional: He is oriented to person, place, and  time. He appears well-developed and well-nourished. No distress.  HENT:  Head: Normocephalic and atraumatic.  Right Ear: Tympanic membrane normal. No drainage. No middle ear effusion.  Left Ear: Tympanic membrane normal. No drainage.  No middle ear effusion.  Nose: Mucosal edema and rhinorrhea present. Right sinus exhibits no maxillary sinus tenderness. Left sinus exhibits no maxillary sinus tenderness.  Mouth/Throat: Uvula is midline. Posterior oropharyngeal erythema present. No oropharyngeal exudate.  Eyes: Conjunctivae and EOM are normal. Pupils are equal, round, and reactive to light. Right eye exhibits no discharge. Left eye exhibits no discharge.  Neck: Normal range of motion.  Cardiovascular: Normal rate, regular rhythm and normal heart sounds.   Pulmonary/Chest: Effort normal and breath sounds normal. No respiratory distress. He has no wheezes.  Abdominal: Soft.  Lymphadenopathy:    He has no cervical adenopathy.  Neurological: He is alert and oriented to person, place, and time.  Skin: Skin is warm and dry.  Psychiatric: He has a normal mood and affect. His behavior is normal.  Nursing note and vitals reviewed.   Results for orders placed or performed in visit on 09/19/16  Rapid strep screen (not at [facility])  Result Value Ref Range   Strep Gp A Ag, IA W/Reflex Negative Negative  Culture, Group A Strep  Result Value Ref Range   Strep A Culture CANCELED       Assessment & Plan:   1. Sore throat - Rapid strep screen (not at [facility]) - azithromycin (ZITHROMAX Z-PAK) 250 MG tablet; Take as directed  Dispense: 6 each; Refill: 0 - Culture, Group A Strep  2. Cough - HYDROcodone-homatropine (HYCODAN) 5-1.5 MG/5ML syrup; Take 5-10 mLs by mouth every 6 (six) hours as needed.  Dispense: 240 mL; Refill: 0 - azithromycin (ZITHROMAX Z-PAK) 250 MG tablet; Take as directed  Dispense: 6 each; Refill: 0  3. Pharyngitis, unspecified etiology   Current Outpatient Prescriptions:  .  aspirin  81 MG chewable tablet, Chew 1 tablet (81 mg total) by mouth daily., Disp: 30 tablet, Rfl: 3 .  atorvastatin (LIPITOR) 80 MG tablet, Take 1 tablet (80 mg total) by mouth daily., Disp: 90 tablet, Rfl: 3 .  carvedilol (COREG) 6.25 MG tablet, Take 1 tablet (6.25 mg total) by mouth 2 (two) times daily with a meal., Disp: 180 tablet, Rfl: 1 .  citalopram (CELEXA) 40 MG tablet, Take 1 tablet (40 mg total) by mouth daily. For depression and irritability., Disp: 30 tablet, Rfl: 5 .  clopidogrel (PLAVIX) 75 MG tablet, Take 1 tablet (75 mg total) by mouth daily with breakfast., Disp: 90 tablet, Rfl: 3 .  empagliflozin (JARDIANCE) 10 MG TABS tablet, Take 10 mg by mouth daily., Disp: 34 tablet, Rfl: 0 .  glipiZIDE (GLUCOTROL) 10 MG tablet, Take 1 tablet (10 mg total) by mouth daily before breakfast., Disp: 90 tablet, Rfl: 1 .  hydrOXYzine (ATARAX/VISTARIL) 10 MG tablet, Take 1 tablet (10 mg total) by mouth 3 (three) times daily., Disp: 90 tablet, Rfl: 5 .  losartan (COZAAR) 25 MG tablet, Take 1 tablet (25 mg total) by mouth daily., Disp: 90 tablet, Rfl: 3 .  nitroGLYCERIN (NITROSTAT) 0.4 MG SL tablet, Place 1 tablet (0.4 mg total) under the tongue every 5 (five) minutes as needed for chest pain. For chest pain. History of MI., Disp: 25 tablet, Rfl: 2 .  sitaGLIPtin (JANUVIA) 50 MG tablet, Take 1 tablet (50 mg total) by mouth daily., Disp: 90 tablet, Rfl: 1 .  azithromycin (ZITHROMAX Z-PAK) 250 MG tablet, Take as directed, Disp: 6 each, Rfl: 0 .  HYDROcodone-homatropine (HYCODAN) 5-1.5 MG/5ML syrup, Take 5-10 mLs by mouth every 6 (six) hours as needed., Disp: 240 mL, Rfl: 0  Continue all other maintenance medications as listed above.  Follow up plan: Return if symptoms worsen or fail to improve.  Educational handout given for s  Remus Loffler PA-C [facility] [address]  [place], [place] [address] [phone]   09/19/2016, 1:41 PM

## 2016-09-19 NOTE — Patient Instructions (Signed)

## 2016-10-08 ENCOUNTER — Ambulatory Visit (INDEPENDENT_AMBULATORY_CARE_PROVIDER_SITE_OTHER): Payer: Self-pay | Admitting: Otolaryngology

## 2016-10-12 ENCOUNTER — Other Ambulatory Visit: Payer: Self-pay

## 2016-10-12 MED ORDER — NITROGLYCERIN 0.4 MG SL SUBL: 0.4000 mg | SUBLINGUAL_TABLET | SUBLINGUAL | 2 refills | Status: DC | PRN

## 2016-10-15 ENCOUNTER — Other Ambulatory Visit: Payer: Self-pay | Admitting: Cardiology

## 2016-10-15 ENCOUNTER — Other Ambulatory Visit: Payer: Self-pay | Admitting: *Deleted

## 2016-10-15 MED ORDER — CLOPIDOGREL BISULFATE 75 MG PO TABS: 75.0000 mg | ORAL_TABLET | Freq: Every day | ORAL | 6 refills | Status: DC

## 2016-10-15 NOTE — Telephone Encounter (Signed)
Called patient to inform that rx had been sent to pharmacy; spoke with patients wife and she informed me that patient has been out his Plavix of 1 week and that [facility] sent all the patents meds to [facility] with out their knowledge. I called [facility] and pharmacist stated patient requested [facility]. Pharmacist stated if I sent in a new rx that they could call the other pharmacy and cancel the Plavix rx and [facility]. Patients wife directly notified.

## 2016-10-15 NOTE — Telephone Encounter (Signed)
°*  STAT* If patient is at the pharmacy, call can be transferred to refill team.   1. Which medications need to be refilled? (please list name of each medication and dose if known) Plavix 75 MG  2. Which pharmacy/location (including street and city if local pharmacy) is medication to be sent to? [id number] [address] [place], [place]  3. Do they need a 30 day or 90 day supply?   Patient is out of medication

## 2016-10-19 ENCOUNTER — Telehealth: Payer: Self-pay | Admitting: Physician Assistant

## 2016-10-20 NOTE — Telephone Encounter (Signed)
Can we send something in or does he ntbs?

## 2016-10-20 NOTE — Telephone Encounter (Signed)
Please contact the patient boils NTBS ( can send in for rash, not boils)

## 2016-10-20 NOTE — Telephone Encounter (Signed)
Pt went to Urgent Care last night.

## 2016-10-30 ENCOUNTER — Encounter: Payer: Self-pay | Admitting: Gastroenterology

## 2016-10-30 ENCOUNTER — Ambulatory Visit (INDEPENDENT_AMBULATORY_CARE_PROVIDER_SITE_OTHER): Payer: Medicaid Other | Admitting: Gastroenterology

## 2016-10-30 ENCOUNTER — Other Ambulatory Visit (INDEPENDENT_AMBULATORY_CARE_PROVIDER_SITE_OTHER): Payer: Medicaid Other

## 2016-10-30 VITALS — BP 120/90 | HR 88 | Ht 66.25 in | Wt 175.4 lb

## 2016-10-30 DIAGNOSIS — D649 Anemia, unspecified: Secondary | ICD-10-CM

## 2016-10-30 DIAGNOSIS — R040 Epistaxis: Secondary | ICD-10-CM

## 2016-10-30 DIAGNOSIS — Z7902 Long term (current) use of antithrombotics/antiplatelets: Secondary | ICD-10-CM | POA: Diagnosis not present

## 2016-10-30 LAB — CBC WITH DIFFERENTIAL/PLATELET
BASOS PCT: 0.5 % (ref 0.0–3.0)
Basophils Absolute: 0 10*3/uL (ref 0.0–0.1)
EOS ABS: 0.2 10*3/uL (ref 0.0–0.7)
Eosinophils Relative: 1.8 % (ref 0.0–5.0)
HCT: 45 % (ref 39.0–52.0)
HEMOGLOBIN: 15.2 g/dL (ref 13.0–17.0)
LYMPHS ABS: 2.8 10*3/uL (ref 0.7–4.0)
Lymphocytes Relative: 27.7 % (ref 12.0–46.0)
MCHC: 33.8 g/dL (ref 30.0–36.0)
MCV: 82.4 fl (ref 78.0–100.0)
MONO ABS: 0.9 10*3/uL (ref 0.1–1.0)
Monocytes Relative: 9.3 % (ref 3.0–12.0)
NEUTROS PCT: 60.7 % (ref 43.0–77.0)
Neutro Abs: 6 10*3/uL (ref 1.4–7.7)
Platelets: 282 10*3/uL (ref 150.0–400.0)
RBC: 5.47 Mil/uL (ref 4.22–5.81)
RDW: 13.8 % (ref 11.5–15.5)
WBC: 9.9 10*3/uL (ref 4.0–10.5)

## 2016-10-30 MED ORDER — PANTOPRAZOLE SODIUM 20 MG PO TBEC: 20.0000 mg | DELAYED_RELEASE_TABLET | Freq: Every day | ORAL | 3 refills | Status: DC

## 2016-10-30 NOTE — Patient Instructions (Signed)
If you are age 45 or older, your body mass index should be between 23-30. Your Body mass index is 28.09 kg/m. If this is out of the aforementioned range listed, please consider follow up with your Primary Care Provider.  If you are age 45 or younger, your body mass index should be between 19-25. Your Body mass index is 28.09 kg/m. If this is out of the aformentioned range listed, please consider follow up with your Primary Care Provider.   Your physician has requested that you go to the basement for the following lab work before leaving today:  CBC  Thank you.

## 2016-10-30 NOTE — Progress Notes (Signed)
HPI :  46 year old male with a past medical history of alcohol abuse, chronic slurred speech, type 2 diabetes, ischemic cardiomyopathy (EF 40-45% on 07/07/16), CAD status post stent placement on 07/11/16 maintained on Plavix, CKD stage III and others listed below, here for a follow up visit.   Initially referred for anemia and rule out GI bleeding, seen by Hyacinth Meeker on  08/28/16.  Patient was admitted to the hospital from 08/18/16-08/19/16 for epistaxis. Patient had a CBC at time of discharge showing a hemoglobin of 12.5. He's had a vessel cauterized in his nose x 2 per ENT for severe bleeding. At the time when he had nose bleed he passed initially red and then dark stools.   He denies any blood in the stools at this time. Normal brown color. He has been managed by ENT for nose bleed, the last time he has had bleeding was 4 weeks ago. He is still on aspirin and plavix, no chest pains. No abdominal pains. Grandfather had colon cancer, no other FH of colon cancer. No prior colonocopy.   He recently was found out his younger sister has cirrhosis due to fatty liver. No alcohol use. He denies any liver problems. He has questions about if he has cirrhosis.    Past Medical History:  Diagnosis Date  . Alcohol abuse   . CAD (coronary artery disease), native coronary artery    a. 01/2009 s/p Anterior MI and thrombectomy with BMS to mid LAD;  b. 06/2016 Cath/PCI: LM nl, LAD 33m/d, LCX nl, OM1/2/3 nl, RDA 35m. Initial attempt @ PCI failed followed by successful CTO LAD PCI and DES (2.5x38 Synergy)-->rec for indefinte DAPT.  . CKD (chronic kidney disease), stage III   . Essential hypertension   . History of nonadherence to medical treatment   . Hyperlipidemia   . Ischemic cardiomyopathy    a. EF 40% by nuc in 2012;  b. 06/2016 Echo: EF 40-45%, apicalanteroseptal and apical AK, Gr2 DD, mod Ca2+ Ao annulus, mild MR.  [name] Slurred speech - Chronic   . Type 2 diabetes mellitus (HCC)      Past Surgical  History:  Procedure Laterality Date  . CORONARY CTO INTERVENTION N/A 07/11/2016   Procedure: Coronary CTO Intervention;  Surgeon: Peter M Swaziland, MD;  Location: [facility] INVASIVE CV LAB;  Service: Cardiovascular;  Laterality: N/A;  . CORONARY STENT INTERVENTION N/A 07/09/2016   Procedure: Coronary Stent Intervention;  Surgeon: Runell Gess, MD;  Location: [facility] INVASIVE CV LAB;  Service: Cardiovascular;  Laterality: N/A;  . Knee arthroscopic surgery Right   . LEFT HEART CATH AND CORONARY ANGIOGRAPHY N/A 07/05/2016   Procedure: Left Heart Cath and Coronary Angiography;  Surgeon: Lyn Records, MD;  Location: [facility] INVASIVE CV LAB;  Service: Cardiovascular;  Laterality: N/A;   Family History  Problem Relation Age of Onset  . Diabetes Mellitus II Unknown   . Heart disease Mother    Social History  Substance Use Topics  . Smoking status: Never Smoker  . Smokeless tobacco: Never Used  . Alcohol use No   Current Outpatient Prescriptions  Medication Sig Dispense Refill  . aspirin 81 MG chewable tablet Chew 1 tablet (81 mg total) by mouth daily. 30 tablet 3  . atorvastatin (LIPITOR) 80 MG tablet Take 1 tablet (80 mg total) by mouth daily. 90 tablet 3  . carvedilol (COREG) 6.25 MG tablet Take 1 tablet (6.25 mg total) by mouth 2 (two) times daily with a meal. 180 tablet 1  .  citalopram (CELEXA) 40 MG tablet Take 1 tablet (40 mg total) by mouth daily. For depression and irritability. 30 tablet 5  . clopidogrel (PLAVIX) 75 MG tablet Take 1 tablet (75 mg total) by mouth daily with breakfast. 30 tablet 6  . empagliflozin (JARDIANCE) 10 MG TABS tablet Take 10 mg by mouth daily. 34 tablet 0  . glipiZIDE (GLUCOTROL) 10 MG tablet Take 1 tablet (10 mg total) by mouth daily before breakfast. 90 tablet 1  . hydrOXYzine (ATARAX/VISTARIL) 10 MG tablet Take 1 tablet (10 mg total) by mouth 3 (three) times daily. 90 tablet 5  . losartan (COZAAR) 25 MG tablet Take 1 tablet (25 mg total) by mouth daily. 90 tablet 3  .  sitaGLIPtin (JANUVIA) 50 MG tablet Take 1 tablet (50 mg total) by mouth daily. 90 tablet 1  . nitroGLYCERIN (NITROSTAT) 0.4 MG SL tablet Place 1 tablet (0.4 mg total) under the tongue every 5 (five) minutes as needed for chest pain. For chest pain. History of MI. (Patient not taking: Reported on 10/30/2016) 25 tablet 2   No current facility-administered medications for this visit.    Allergies  Allergen Reactions  . Penicillins Rash    Has patient had a PCN reaction causing immediate rash, facial/tongue/throat swelling, SOB or lightheadedness with hypotension: Yes Has patient had a PCN reaction causing severe rash involving mucus membranes or skin necrosis: Yes Has patient had a PCN reaction that required hospitalization: No Has patient had a PCN reaction occurring within the last 10 years: Yes If all of the above answers are "NO", then may proceed with Cephalosporin use.      Review of Systems: All systems reviewed and negative except where noted in HPI.   CBC Latest Ref Rng & Units 10/30/2016 09/18/2016 08/28/2016  WBC 4.0 - 10.5 K/uL 9.9 9.5 12.0(H)  Hemoglobin 13.0 - 17.0 g/dL 01.015.2 27.214.1 53.613.1  Hematocrit 39.0 - 52.0 % 45.0 41.5 38.7(L)  Platelets 150.0 - 400.0 K/uL 282.0 245 266.0    Lab Results  Component Value Date   ALT 20 08/19/2016   AST 22 08/19/2016   ALKPHOS 60 08/19/2016   BILITOT 0.5 08/19/2016    No results found for: IRON, TIBC, FERRITIN    Physical Exam: BP 120/90 (BP Location: Left Arm, Patient Position: Sitting, Cuff Size: Normal)   Pulse 88   Ht 5' 6.25" (1.683 m) Comment: height measured without shoes  Wt 175 lb 6 oz (79.5 kg)   BMI 28.09 kg/m  Constitutional: Pleasant,well-developed, male in no acute distress. HEENT: Normocephalic and atraumatic. Conjunctivae are normal. No scleral icterus. Neck supple.  Cardiovascular: Normal rate, regular rhythm.  Pulmonary/chest: Effort normal and breath sounds normal. No wheezing, rales or rhonchi. Abdominal: Soft,  nondistended, nontender. . There are no masses palpable. No hepatomegaly. Extremities: no edema Lymphadenopathy: No cervical adenopathy noted. Neurological: Alert and oriented to person place and time. Skin: Skin is warm and dry. No rashes noted. Psychiatric: Normal mood and affect. Behavior is normal.   ASSESSMENT AND PLAN: 46 year old male with a history of CAD status post stent placement in April on aspirin and Plavix, course complicated by severe epistasis that required treatment per ENT. During this time he passed blood in his stool and had anemia. Since treatment per ENT is had no further bleeding episodes, and hemoglobin improved. We repeated a CBC today and his hemoglobin has normalized.  I think it is very likely that he had bleeding from epistasis causing prior change in his stools, and not primary  GI bleeding. We discussed that further evaluation of his bowel with endoscopy would require anesthesia. Given his relatively recent MI with stent placement would not want to hold his Plavix, and would prefer to avoid endoscopy if possible. I discussed these issues with him. His Hgb is normal and he feels his bleeding is related to epistaxis and wants to hold off on endoscopy and agreed with the plan. If he notes blood in his stools in the future he should contact me. Of note, given his history of nasal bleeding ongoing aspirin and plavix use, it is otherwise not unreasonable to add a PPI for prophylaxis to minimize his risk of upper GI bleeding. Will start pantoprazole 20mg  once daily for now.  Otherwise, LFTs and platelets are normal. He has no history of known liver disease and reassured him. Recommend checking LFTs yearly moving forward. He agreed.  Ileene Patrick, MD [facility] Pager [phone]

## 2016-10-30 NOTE — Progress Notes (Signed)
Cardiology Office Note   Date:  10/31/2016   ID:  [name], DOB 09-08-1970, MRN [id number]  PCP:  Remus Loffler, PA-C  Cardiologist:   Rollene Rotunda, MD     Chief Complaint  Patient presents with  . Coronary Artery Disease      History of Present Illness: [name] is a 46 y.o. male who presents for follow up of chronic systolic HF.  He has CAD, HTN, HLD, DM, CKD stage III, ICM and noncompliance. He has a history of alcohol abuse as well. He had anterior MI in November 2010 treated with thrombectomy and bare metal stent to mid LAD. He was lost to follow-up since 2012, however recently admitted in April 2018 with chest pain. He underwent diagnostic cardiac catheterization which revealed severe in-stent restenosis in the previously placed LAD stent. Initial attempt at PCI was unsuccessful. This was reviewed by our CTO team, he eventually underwent successful PCI and DES placement of LAD lesion. Echocardiogram obtained on 07/07/2016 showed EF 40-45%, grade 2 diastolic dysfunction, akinesis of the apical anteroseptal and apical myocardium, mild MR. Given the stent placement, he was placed on aspirin and Plavix. He went to the hospital on 08/18/2016 with profuse left epistaxis. He was seen by ENT who placed nasal packing.    Since he was last seen he has done well.  The patient denies any new symptoms such as chest discomfort, neck or arm discomfort. There has been no new shortness of breath, PND or orthopnea. There have been no reported palpitations, presyncope or syncope.  He is not very active but will at times go for walks without symptoms.    Past Medical History:  Diagnosis Date  . Alcohol abuse   . CAD (coronary artery disease), native coronary artery    a. 01/2009 s/p Anterior MI and thrombectomy with BMS to mid LAD;  b. 06/2016 Cath/PCI: LM nl, LAD 66m/d, LCX nl, OM1/2/3 nl, RDA 44m. Initial attempt @ PCI failed followed by successful CTO LAD PCI and DES (2.5x38 Synergy)-->rec  for indefinte DAPT.  . CKD (chronic kidney disease), stage III   . Essential hypertension   . History of nonadherence to medical treatment   . Hyperlipidemia   . Ischemic cardiomyopathy    a. EF 40% by nuc in 2012;  b. 06/2016 Echo: EF 40-45%, apicalanteroseptal and apical AK, Gr2 DD, mod Ca2+ Ao annulus, mild MR.  [name] Slurred speech - Chronic   . Type 2 diabetes mellitus (HCC)     Past Surgical History:  Procedure Laterality Date  . CORONARY CTO INTERVENTION N/A 07/11/2016   Procedure: Coronary CTO Intervention;  Surgeon: Peter M Swaziland, MD;  Location: [facility] INVASIVE CV LAB;  Service: Cardiovascular;  Laterality: N/A;  . CORONARY STENT INTERVENTION N/A 07/09/2016   Procedure: Coronary Stent Intervention;  Surgeon: Runell Gess, MD;  Location: [facility] INVASIVE CV LAB;  Service: Cardiovascular;  Laterality: N/A;  . Knee arthroscopic surgery Right   . LEFT HEART CATH AND CORONARY ANGIOGRAPHY N/A 07/05/2016   Procedure: Left Heart Cath and Coronary Angiography;  Surgeon: Lyn Records, MD;  Location: [facility] INVASIVE CV LAB;  Service: Cardiovascular;  Laterality: N/A;     Current Outpatient Prescriptions  Medication Sig Dispense Refill  . aspirin 81 MG chewable tablet Chew 1 tablet (81 mg total) by mouth daily. 30 tablet 3  . atorvastatin (LIPITOR) 80 MG tablet Take 1 tablet (80 mg total) by mouth daily. 90 tablet 3  . carvedilol (COREG) 6.25 MG  tablet Take 1 tablet (6.25 mg total) by mouth 2 (two) times daily with a meal. 180 tablet 1  . citalopram (CELEXA) 40 MG tablet Take 1 tablet (40 mg total) by mouth daily. For depression and irritability. 30 tablet 5  . clopidogrel (PLAVIX) 75 MG tablet Take 1 tablet (75 mg total) by mouth daily with breakfast. 30 tablet 6  . empagliflozin (JARDIANCE) 10 MG TABS tablet Take 10 mg by mouth daily. 34 tablet 0  . glipiZIDE (GLUCOTROL) 10 MG tablet Take 1 tablet (10 mg total) by mouth daily before breakfast. 90 tablet 1  . hydrOXYzine (ATARAX/VISTARIL) 10 MG tablet  Take 1 tablet (10 mg total) by mouth 3 (three) times daily. 90 tablet 5  . losartan (COZAAR) 25 MG tablet Take 1 tablet (25 mg total) by mouth daily. 90 tablet 3  . nitroGLYCERIN (NITROSTAT) 0.4 MG SL tablet Place 1 tablet (0.4 mg total) under the tongue every 5 (five) minutes as needed for chest pain. For chest pain. History of MI. 25 tablet 2  . pantoprazole (PROTONIX) 20 MG tablet Take 1 tablet (20 mg total) by mouth daily. 90 tablet 3  . sitaGLIPtin (JANUVIA) 50 MG tablet Take 1 tablet (50 mg total) by mouth daily. 90 tablet 1   No current facility-administered medications for this visit.     Allergies:   Penicillins    ROS:  Please see the history of present illness.   Otherwise, review of systems are positive for none.   All other systems are reviewed and negative.    PHYSICAL EXAM: VS:  BP 120/84   Pulse 90   Ht 5' 6.25" (1.683 m)   Wt 177 lb (80.3 kg)   BMI 28.35 kg/m  , BMI Body mass index is 28.35 kg/m. GENERAL:  Well appearing NECK:  No jugular venous distention, waveform within normal limits, carotid upstroke brisk and symmetric, no bruits, no thyromegal LUNGS:  Clear to auscultation bilaterally BACK:  No CVA tenderness CHEST:  Unremarkable HEART:  PMI not displaced or sustained,S1 and S2 within normal limits, no S3, no S4, no clicks, no rubs, no murmurs ABD:  Flat, positive bowel sounds normal in frequency in pitch, no bruits, no rebound, no guarding, no midline pulsatile mass, no hepatomegaly, no splenomegaly EXT:  2 plus pulses throughout, no edema, no cyanosis no clubbing    EKG:  EKG is ordered today. The ekg ordered today demonstrates Sinus rhythm, rate 90, old inferior infarct, left axis deviation, old anteroseptal infarct, no acute ST-T wave changes.   Recent Labs: 08/19/2016: ALT 20 09/18/2016: BUN 14; Creatinine, Ser 1.50; Potassium 4.6; Sodium 142 10/30/2016: Hemoglobin 15.2; Platelets 282.0    Lipid Panel    Component Value Date/Time   CHOL 146  07/27/2016 1039   TRIG 170 (H) 07/27/2016 1039   HDL 62 07/27/2016 1039   CHOLHDL 2.4 07/27/2016 1039   CHOLHDL 2.1 07/06/2016 0534   VLDL 19 07/06/2016 0534   LDLCALC 50 07/27/2016 1039      Wt Readings from Last 3 Encounters:  10/31/16 177 lb (80.3 kg)  10/30/16 175 lb 6 oz (79.5 kg)  09/19/16 174 lb 6.4 oz (79.1 kg)      Other studies Reviewed: Additional studies/ records that were reviewed today include: None. Review of the above records demonstrates:  Please see elsewhere in the note.     ASSESSMENT AND PLAN:   CAD:  The patient has no new sypmtoms.  No further cardiovascular testing is indicated.  We will  continue with aggressive risk reduction and meds as listed.  Hypertension: Blood pressure is at target.  He will continue meds as listed.   Hyperlipidemia:  The most recent LDL was 50 and he will stay on the meds as listed.   DM II: A1C was 7.3 in May.  He will continue meds as listed.   CKD stage III:  Creat was stable in June at 1.5.  No change in therapy is indicated.   Ischemic cardiomyopathy:   He has a mildly reduced EF.  He seems to be euvolemic.  No change in therapy is planned.   Current medicines are reviewed at length with the patient today.  The patient does not have concerns regarding medicines.  The following changes have been made:  no change  Labs/ tests ordered today include: None  Orders Placed This Encounter  Procedures  . EKG 12-Lead     Disposition:   FU with in April.    Signed, Rollene RotundaJames Omri Bertran, MD  10/31/2016 11:35 AM    [facility]

## 2016-10-31 ENCOUNTER — Encounter: Payer: Self-pay | Admitting: Cardiology

## 2016-10-31 ENCOUNTER — Ambulatory Visit (INDEPENDENT_AMBULATORY_CARE_PROVIDER_SITE_OTHER): Payer: Medicaid Other | Admitting: Cardiology

## 2016-10-31 VITALS — BP 120/84 | HR 90 | Ht 66.25 in | Wt 177.0 lb

## 2016-10-31 DIAGNOSIS — I10 Essential (primary) hypertension: Secondary | ICD-10-CM

## 2016-10-31 DIAGNOSIS — I2510 Atherosclerotic heart disease of native coronary artery without angina pectoris: Secondary | ICD-10-CM | POA: Insufficient documentation

## 2016-10-31 DIAGNOSIS — E785 Hyperlipidemia, unspecified: Secondary | ICD-10-CM | POA: Diagnosis not present

## 2016-10-31 NOTE — Patient Instructions (Signed)
Medication Instructions:  The current medical regimen is effective;  continue present plan and medications.  Follow-Up: Follow up in April 2019 with Dr. Antoine PocheHochrein in [place].  You will receive a letter in the mail 2 months before you are due.  Please call us when you receive this letter to schedule your follow up appointment.  If you need a refill on your cardiac medications before your next appointment, please call your pharmacy.  Thank you for choosing [facility]!!

## 2016-11-01 ENCOUNTER — Other Ambulatory Visit: Payer: Self-pay

## 2016-11-07 ENCOUNTER — Telehealth: Payer: Self-pay | Admitting: Cardiology

## 2016-11-07 ENCOUNTER — Telehealth: Payer: Self-pay | Admitting: Gastroenterology

## 2016-11-07 ENCOUNTER — Other Ambulatory Visit: Payer: Self-pay

## 2016-11-07 DIAGNOSIS — K625 Hemorrhage of anus and rectum: Secondary | ICD-10-CM

## 2016-11-07 NOTE — Telephone Encounter (Signed)
Returned call to patient's wife He had a colonoscopy done on 8/7 He has had light-colored bleeding from rectum occurring when patient has BM Wife does not think he has hemorrhoids He is taking ASA & plavix Husband asked wife to call our office - requested that she also call GI MD to make them aware and seek recommendations  Will route to MD/pharmacist for advice

## 2016-11-07 NOTE — Telephone Encounter (Signed)
He denied any bleeding symptoms when I saw him in clinic last week.  Why don't we have him go to the lab to check a CBC and check stools for OB x 3. He should be on protonix since our last visit. If he has any symptoms concerning for significant bleeding he needs to let us know. Thanks

## 2016-11-07 NOTE — Telephone Encounter (Signed)
New message  Pt wife call requesting to speak with RN about pt bleed from rectum

## 2016-11-07 NOTE — Telephone Encounter (Signed)
Patient had his wife call, when I called them back he was not at home, he was at his mom's home. They report that at every bm has seen darker blood in his stool. Denies any nose bleeds, no blood in between bowel movements. Please advise.

## 2016-11-07 NOTE — Telephone Encounter (Signed)
Called patient spoke to him and his wife, let them know that will need to get blood work and OB cards x 3. They will come to the lab tomorrow.

## 2016-11-08 SURGERY — Surgical Case
Anesthesia: *Unknown

## 2016-11-11 NOTE — Telephone Encounter (Signed)
I would like for him to continue the Plavix unless he is told by GI or his primary MD that he has had ongoing bleeding that will require him to stop it.  He should see his primary MD/GI.

## 2016-11-12 NOTE — Telephone Encounter (Signed)
Contacted patient and he states his bleeding as resolved. He remains on Plavix.

## 2016-11-19 ENCOUNTER — Ambulatory Visit: Payer: Self-pay | Admitting: Physician Assistant

## 2016-11-21 ENCOUNTER — Ambulatory Visit: Payer: Medicaid Other | Admitting: Physician Assistant

## 2016-11-22 ENCOUNTER — Telehealth: Payer: Self-pay | Admitting: Physician Assistant

## 2016-11-22 ENCOUNTER — Encounter: Payer: Self-pay | Admitting: Physician Assistant

## 2016-11-22 NOTE — Telephone Encounter (Signed)
Left message for patient to please call back and reschedule follow up appointment

## 2016-12-06 ENCOUNTER — Ambulatory Visit: Payer: Self-pay | Admitting: Cardiology

## 2017-01-23 ENCOUNTER — Telehealth: Payer: Self-pay | Admitting: Physician Assistant

## 2017-01-23 NOTE — Telephone Encounter (Signed)
Medication list faxed to [facility] [phone]

## 2017-01-28 ENCOUNTER — Other Ambulatory Visit: Payer: Self-pay | Admitting: *Deleted

## 2017-01-28 MED ORDER — EMPAGLIFLOZIN 10 MG PO TABS: 10.0000 mg | ORAL_TABLET | Freq: Every day | ORAL | 0 refills | Status: DC

## 2017-02-15 ENCOUNTER — Telehealth: Payer: Self-pay | Admitting: Physician Assistant

## 2017-02-15 NOTE — Telephone Encounter (Signed)
   Patient son called. The patient last p.m., was having problems peeling potatoes.  He was having trouble using his right arm.  He was questioned by his family and admitted that he had been having problems with his right arm for about a week.  The son states the patient's left arm has good strength and grip.  His right arm is extremely weak and is having trouble holding anything.  His right leg is fine and the son does not see any kind of facial droop.  He has no history of CVA.  Recommended that the patient proceed to the closest hospital.  The patient's son states that they do not wish to go to [place] as he is currently in [place].  Encouraged him strongly to go to the hospital, stated that [facility] is associated with [facility].  The son states he will take him somewhere.  Theodore DemarkRhonda Diana Armijo, PA-C 02/15/2017 4:30 PM Beeper [phone]

## 2017-04-21 ENCOUNTER — Emergency Department (HOSPITAL_COMMUNITY): Payer: Medicaid Other

## 2017-04-21 ENCOUNTER — Encounter (HOSPITAL_COMMUNITY): Payer: Self-pay | Admitting: Emergency Medicine

## 2017-04-21 ENCOUNTER — Emergency Department (HOSPITAL_COMMUNITY)
Admission: EM | Admit: 2017-04-21 | Discharge: 2017-04-21 | Disposition: A | Discharge status: Home / Self Care | Payer: Medicaid Other | Attending: Emergency Medicine | Admitting: Emergency Medicine

## 2017-04-21 ENCOUNTER — Other Ambulatory Visit: Payer: Self-pay

## 2017-04-21 DIAGNOSIS — R0789 Other chest pain: Secondary | ICD-10-CM | POA: Insufficient documentation

## 2017-04-21 DIAGNOSIS — E119 Type 2 diabetes mellitus without complications: Secondary | ICD-10-CM | POA: Diagnosis not present

## 2017-04-21 DIAGNOSIS — I2510 Atherosclerotic heart disease of native coronary artery without angina pectoris: Secondary | ICD-10-CM | POA: Diagnosis not present

## 2017-04-21 DIAGNOSIS — N183 Chronic kidney disease, stage 3 (moderate): Secondary | ICD-10-CM | POA: Diagnosis not present

## 2017-04-21 DIAGNOSIS — Z9114 Patient's other noncompliance with medication regimen: Secondary | ICD-10-CM | POA: Diagnosis not present

## 2017-04-21 DIAGNOSIS — Z91199 Patient's noncompliance with other medical treatment and regimen due to unspecified reason: Secondary | ICD-10-CM

## 2017-04-21 DIAGNOSIS — R079 Chest pain, unspecified: Secondary | ICD-10-CM

## 2017-04-21 DIAGNOSIS — Z79899 Other long term (current) drug therapy: Secondary | ICD-10-CM | POA: Diagnosis not present

## 2017-04-21 DIAGNOSIS — Z9119 Patient's noncompliance with other medical treatment and regimen: Secondary | ICD-10-CM | POA: Insufficient documentation

## 2017-04-21 DIAGNOSIS — I129 Hypertensive chronic kidney disease with stage 1 through stage 4 chronic kidney disease, or unspecified chronic kidney disease: Secondary | ICD-10-CM | POA: Insufficient documentation

## 2017-04-21 LAB — BASIC METABOLIC PANEL
ANION GAP: 13 (ref 5–15)
BUN: 16 mg/dL (ref 6–20)
CALCIUM: 9.8 mg/dL (ref 8.9–10.3)
CHLORIDE: 93 mmol/L — AB (ref 101–111)
CO2: 24 mmol/L (ref 22–32)
Creatinine, Ser: 1.69 mg/dL — ABNORMAL HIGH (ref 0.61–1.24)
GFR calc Af Amer: 55 mL/min — ABNORMAL LOW (ref >60)
GFR calc non Af Amer: 47 mL/min — ABNORMAL LOW (ref >60)
GLUCOSE: 524 mg/dL — AB (ref 65–99)
Potassium: 4.3 mmol/L (ref 3.5–5.1)
Sodium: 130 mmol/L — ABNORMAL LOW (ref 135–145)

## 2017-04-21 LAB — CBC
HCT: 43.9 % (ref 39.0–52.0)
Hemoglobin: 14.9 g/dL (ref 13.0–17.0)
MCH: 26.9 pg (ref 26.0–34.0)
MCHC: 33.9 g/dL (ref 30.0–36.0)
MCV: 79.4 fL (ref 78.0–100.0)
Platelets: 221 10*3/uL (ref 150–400)
RBC: 5.53 MIL/uL (ref 4.22–5.81)
RDW: 12.6 % (ref 11.5–15.5)
WBC: 9.2 10*3/uL (ref 4.0–10.5)

## 2017-04-21 LAB — CBG MONITORING, ED: Glucose-Capillary: 255 mg/dL — ABNORMAL HIGH (ref 65–99)

## 2017-04-21 LAB — I-STAT TROPONIN, ED: TROPONIN I, POC: 0.01 ng/mL (ref 0.00–0.08)

## 2017-04-21 LAB — D-DIMER, QUANTITATIVE: D-Dimer, Quant: <0.27 ug/mL-FEU (ref 0.00–0.50)

## 2017-04-21 LAB — TROPONIN I

## 2017-04-21 MED ORDER — SITAGLIPTIN PHOSPHATE 50 MG PO TABS: 50.0000 mg | ORAL_TABLET | Freq: Every day | ORAL | 0 refills | Status: DC

## 2017-04-21 MED ORDER — PANTOPRAZOLE SODIUM 20 MG PO TBEC: 20.0000 mg | DELAYED_RELEASE_TABLET | Freq: Every day | ORAL | 0 refills | Status: DC

## 2017-04-21 MED ORDER — NITROGLYCERIN 0.4 MG SL SUBL
0.4000 mg | SUBLINGUAL_TABLET | SUBLINGUAL | Status: DC | PRN
  Administered 2017-04-21: 0.4 mg via SUBLINGUAL
  Filled 2017-04-21: qty 1

## 2017-04-21 MED ORDER — INSULIN ASPART 100 UNIT/ML ~~LOC~~ SOLN
8.0000 [IU] | Freq: Once | SUBCUTANEOUS | Status: AC
  Administered 2017-04-21: 8 [IU] via SUBCUTANEOUS
  Filled 2017-04-21: qty 1

## 2017-04-21 MED ORDER — ATORVASTATIN CALCIUM 80 MG PO TABS: 80.0000 mg | ORAL_TABLET | Freq: Every day | ORAL | 0 refills | Status: DC

## 2017-04-21 MED ORDER — CLOPIDOGREL BISULFATE 75 MG PO TABS: 75.0000 mg | ORAL_TABLET | Freq: Every day | ORAL | 0 refills | Status: DC

## 2017-04-21 MED ORDER — SODIUM CHLORIDE 0.9 % IV BOLUS (SEPSIS)
1000.0000 mL | Freq: Once | INTRAVENOUS | Status: AC
  Administered 2017-04-21: 1000 mL via INTRAVENOUS

## 2017-04-21 MED ORDER — CLOPIDOGREL BISULFATE 75 MG PO TABS
75.0000 mg | ORAL_TABLET | Freq: Once | ORAL | Status: AC
  Administered 2017-04-21: 75 mg via ORAL
  Filled 2017-04-21: qty 1

## 2017-04-21 MED ORDER — GLIPIZIDE 10 MG PO TABS: 10.0000 mg | ORAL_TABLET | Freq: Every day | ORAL | 0 refills | Status: DC

## 2017-04-21 MED ORDER — FENTANYL CITRATE (PF) 100 MCG/2ML IJ SOLN: 50.0000 ug | Freq: Once | INTRAMUSCULAR | Status: DC

## 2017-04-21 NOTE — ED Notes (Signed)
Date and time results received: 04/21/17 4:54 PM  (use smartphrase ".now" to insert current time)  Test: Glucose Critical Value: 524  Name of Provider Notified: [facility]  Orders Received? Or Actions Taken?: Orders Received - See Orders for details

## 2017-04-21 NOTE — ED Provider Notes (Signed)
Emergency Department Provider Note   I have reviewed the triage vital signs and the nursing notes.   HISTORY  Chief Complaint Chest Pain   HPI [name] is a 47 y.o. male with a history of coronary artery disease status post stent back in August, chronic kidney disease, hypertension hyperlipidemia and diabetes a presents the emergency department today secondary to chest pain.  Patient states he feels sore in both left and right side of his chest.  It started about hour prior to arrival his wife is well called EMS.  He has no associated symptoms such as nausea, vomiting, diarrhea, constipation, shortness of breath, diaphoresis, lightheadedness or dizziness.  States he has not really done anything to hurt himself is had no trauma, lifting heavy things or change in work conditions.  Has not tried anything for symptoms himself but EMS did give him 324 of aspirin and 3 nitroglycerin which brought his pain down from a 7 out of 10 to 2 out of 10 where it is currently.  No recent fevers or cough.  No leg swelling or leg pain.  States he does have a history of blood clots somewhere.  Been compliant with his Plavix and aspirin but no other blood thinners. No other associated or modifying symptoms.    Past Medical History:  Diagnosis Date  . Alcohol abuse   . CAD (coronary artery disease), native coronary artery    a. 01/2009 s/p Anterior MI and thrombectomy with BMS to mid LAD;  b. 06/2016 Cath/PCI: LM nl, LAD 3413m/d, LCX nl, OM1/2/3 nl, RDA 4626m. Initial attempt @ PCI failed followed by successful CTO LAD PCI and DES (2.5x38 Synergy)-->rec for indefinte DAPT.  . CKD (chronic kidney disease), stage III (HCC)   . Essential hypertension   . History of nonadherence to medical treatment   . Hyperlipidemia   . Ischemic cardiomyopathy    a. EF 40% by nuc in 2012;  b. 06/2016 Echo: EF 40-45%, apicalanteroseptal and apical AK, Gr2 DD, mod Ca2+ Ao annulus, mild MR.  [name]. Slurred speech - Chronic   . Type 2  diabetes mellitus [facility](HCC)     Patient Active Problem List   Diagnosis Date Noted  . Coronary artery disease involving native coronary artery of native heart without angina pectoris 10/31/2016  . Epistaxis 08/18/2016  . Chronic kidney disease (CKD), active medical management without dialysis, stage 3 (moderate) (HCC)   . Unstable angina (HCC)   . Acute renal failure superimposed on stage 3 chronic kidney disease (HCC)   . Chest pain 07/05/2016  . Major depression 08/22/2011  . DYSLIPIDEMIA 02/22/2009  . CORONARY ATHEROSCLEROSIS NATIVE CORONARY ARTERY 02/22/2009  . Type 2 diabetes mellitus, uncontrolled (HCC) 02/21/2009  . Essential hypertension 02/21/2009    Past Surgical History:  Procedure Laterality Date  . CORONARY CTO INTERVENTION N/A 07/11/2016   Procedure: Coronary CTO Intervention;  Surgeon: Peter M SwazilandJordan, MD;  [facility];  Service: Cardiovascular;  Laterality: N/A;  . CORONARY STENT INTERVENTION N/A 07/09/2016   Procedure: Coronary Stent Intervention;  Surgeon: Runell GessJonathan J Berry, MD;  Location: [facility] INVASIVE CV LAB;  Service: Cardiovascular;  Laterality: N/A;  . Knee arthroscopic surgery Right   . LEFT HEART CATH AND CORONARY ANGIOGRAPHY N/A 07/05/2016   Procedure: Left Heart Cath and Coronary Angiography;  Surgeon: Lyn RecordsHenry W Smith, MD;  [facility];  Service: Cardiovascular;  Laterality: N/A;    Current Outpatient Rx  . Order #: [id number] Class: Historical Med  .  Order #: [id number] Class: Historical Med  . Order #: [id number] Class: Historical Med  . Order #: [id number] Class: Historical Med  . Order #: [id number] Class: Historical Med  . Order #: [id number] Class: Historical Med  . Order #: [id number] Class: Historical Med  . Order #: [id number] Class: Print  . Order #: [id number] Class: Print  . Order #: [id number] Class: Print  . Order #: [id number] Class: Print  . Order #: [id number] Class: Print    Allergies Penicillins  Family History  Problem  Relation Age of Onset  . Diabetes Mellitus II Unknown   . Heart disease Mother     Social History Social History   Tobacco Use  . Smoking status: Never Smoker  . Smokeless tobacco: Never Used  Substance Use Topics  . Alcohol use: No    Alcohol/week: 0.6 oz    Types: 1 Cans of beer per week  . Drug use: No    Review of Systems  All other systems negative except as documented in the HPI. All pertinent positives and negatives as reviewed in the HPI. ____________________________________________   PHYSICAL EXAM:  VITAL SIGNS: ED Triage Vitals  Enc Vitals Group     BP 04/21/17 1602 (!) 146/100     Pulse Rate 04/21/17 1602 (!) 108     Resp 04/21/17 1602 18     Temp 04/21/17 1602 99.2 F (37.3 C)     Temp Source 04/21/17 1602 Oral     SpO2 04/21/17 1602 93 %     Weight 04/21/17 1559 175 lb (79.4 kg)     Height 04/21/17 1559 5\' 8"  (1.727 m)    Constitutional: Alert and oriented. Well appearing and in no acute distress. Eyes: Conjunctivae are normal. PERRL. EOMI. Head: Atraumatic. Nose: No congestion/rhinnorhea. Mouth/Throat: Mucous membranes are moist.  Oropharynx non-erythematous. Neck: No stridor.  No meningeal signs.   Cardiovascular: tachycardic rate, regular rhythm. Good peripheral circulation. Grossly normal heart sounds.   Respiratory: tachypneic respiratory effort.  No retractions. Lungs LLL crackles. Gastrointestinal: Soft and nontender. No distention.  Musculoskeletal: No lower extremity tenderness nor edema. No gross deformities of extremities. Neurologic:  Normal speech and language. No gross focal neurologic deficits are appreciated.  Skin:  Skin is warm, dry and intact. No rash noted.   ____________________________________________   LABS (all labs ordered are listed, but only abnormal results are displayed)  Labs Reviewed  BASIC METABOLIC PANEL - Abnormal; Notable for the following components:      Result Value   Sodium 130 (*)    Chloride 93 (*)      Glucose, Bld 524 (*)    Creatinine, Ser 1.69 (*)    GFR calc non Af Amer 47 (*)    GFR calc Af Amer 55 (*)    All other components within normal limits  CBG MONITORING, ED - Abnormal; Notable for the following components:   Glucose-Capillary 255 (*)    All other components within normal limits  CBC  TROPONIN I  D-DIMER, QUANTITATIVE (NOT AT [facility])  I-STAT TROPONIN, ED   ____________________________________________  EKG   EKG Interpretation  Date/Time:  Sunday April 21 2017 16:01:39 EST Ventricular Rate:  99 PR Interval:    QRS Duration: 92 QT Interval:  404 QTC Calculation: 519 R Axis:   -71 Text Interpretation:  Sinus rhythm Probable left atrial enlargement Anterolateral infarct, old Prolonged QT interval  improved ST changes since april 2018 Confirmed by Marily Memos [phone]) on 04/21/2017 4:14:34 PM       ____________________________________________  RADIOLOGY  Dg Chest 2  View  Result Date: 04/21/2017 CLINICAL DATA:  Chest pain onset 1 hour ago. EXAM: CHEST  2 VIEW COMPARISON:  07/12/2016 FINDINGS: Slightly low lung volumes with crowding of interstitial lung markings. No pulmonary consolidation, effusion or overt pulmonary edema. No acute osseous abnormality. Heart and mediastinal contours are stable. IMPRESSION: Low lung volumes without active pulmonary disease. Electronically Signed   By: Tollie Eth M.D.   On: 04/21/2017 16:43    ____________________________________________   PROCEDURES  Procedure(s) performed:   Procedures   ____________________________________________   INITIAL IMPRESSION / ASSESSMENT AND PLAN / ED COURSE  Start somewhat atypical for ACS however he does have a strong history so will check a troponin.  EKG looks similar to previous EKGs if not slightly improved.  Also is tachypneic with a oxygen as low as 93% and tachycardic he has crackles in his left lower lobe could be concerning for possible pulmonary embolus versus pneumonia so we  will check a chest x-ray and d-dimer as well.  We will give him some nitroglycerin to see if it helps with his pain if not we will change to narcotics.  On reevaluation patient is chest pain-free.  His blood sugars above 500 but no evidence of diabetic ketoacidosis.  His troponin is normal.  Chest x-ray is clear.  Also found out he has not been take his medications for at least a month.  Consider in-stent thrombosis for the cause of his chest pain however no EKG changes makes that unlikely.  Will need to restart his Plavix at least.   Workup unremarkable. Unclear cause of symptoms, low risk for ACS at this time. D dimer negative, PE unlikely. Chest pain free. Will restart DM/CAD/HTN meds and ask to fu w/ PCP for further management.  Pertinent labs & imaging results that were available during my care of the patient were reviewed by me and considered in my medical decision making (see chart for details).  ____________________________________________  FINAL CLINICAL IMPRESSION(S) / ED DIAGNOSES  Final diagnoses:  Nonspecific chest pain  Noncompliance     MEDICATIONS GIVEN DURING THIS VISIT:  Medications  sodium chloride 0.9 % bolus 1,000 mL (0 mLs Intravenous Stopped 04/21/17 1827)  insulin aspart (novoLOG) injection 8 Units (8 Units Subcutaneous Given 04/21/17 1745)  clopidogrel (PLAVIX) tablet 75 mg (75 mg Oral Given 04/21/17 1832)     NEW OUTPATIENT MEDICATIONS STARTED DURING THIS VISIT:  Discharge Medication List as of 04/21/2017  8:03 PM      Note:  This note was prepared with assistance of Dragon voice recognition software. Occasional wrong-word or sound-a-like substitutions may have occurred due to the inherent limitations of voice recognition software.   Marily Memos, MD 04/21/17 2325

## 2017-04-21 NOTE — ED Triage Notes (Addendum)
Pt reports chest pain onset approximately hour ago. Pt reports midsternal chest pain. EMS gave 4 baby aspirin and nitro x2 en route. Pts reports pain went from 7/10 to 2/10. Pt reports had stents placed in august of 2018.   pt reports is noncompliant with hypertension medication. Pt reports took last dose x5 days ago.

## 2017-04-25 ENCOUNTER — Ambulatory Visit: Payer: Self-pay | Admitting: Cardiology

## 2017-06-13 ENCOUNTER — Ambulatory Visit: Payer: Self-pay | Admitting: Cardiology

## 2017-07-03 ENCOUNTER — Ambulatory Visit: Payer: Self-pay | Admitting: Cardiology

## 2017-08-08 DIAGNOSIS — E782 Mixed hyperlipidemia: Secondary | ICD-10-CM | POA: Diagnosis not present

## 2017-08-08 DIAGNOSIS — I2584 Coronary atherosclerosis due to calcified coronary lesion: Secondary | ICD-10-CM | POA: Diagnosis not present

## 2017-08-08 DIAGNOSIS — I10 Essential (primary) hypertension: Secondary | ICD-10-CM | POA: Diagnosis not present

## 2017-08-08 DIAGNOSIS — F330 Major depressive disorder, recurrent, mild: Secondary | ICD-10-CM | POA: Diagnosis not present

## 2017-08-08 DIAGNOSIS — E1144 Type 2 diabetes mellitus with diabetic amyotrophy: Secondary | ICD-10-CM | POA: Diagnosis not present

## 2017-09-23 DIAGNOSIS — H6121 Impacted cerumen, right ear: Secondary | ICD-10-CM | POA: Diagnosis not present

## 2017-09-23 DIAGNOSIS — H60391 Other infective otitis externa, right ear: Secondary | ICD-10-CM | POA: Diagnosis not present

## 2017-10-10 DIAGNOSIS — K921 Melena: Secondary | ICD-10-CM | POA: Diagnosis not present

## 2017-11-28 DIAGNOSIS — K625 Hemorrhage of anus and rectum: Secondary | ICD-10-CM | POA: Diagnosis not present

## 2017-12-03 DIAGNOSIS — F329 Major depressive disorder, single episode, unspecified: Secondary | ICD-10-CM | POA: Diagnosis not present

## 2017-12-03 DIAGNOSIS — K219 Gastro-esophageal reflux disease without esophagitis: Secondary | ICD-10-CM | POA: Diagnosis not present

## 2017-12-03 DIAGNOSIS — F419 Anxiety disorder, unspecified: Secondary | ICD-10-CM | POA: Diagnosis not present

## 2017-12-03 DIAGNOSIS — E785 Hyperlipidemia, unspecified: Secondary | ICD-10-CM | POA: Diagnosis not present

## 2017-12-03 DIAGNOSIS — K625 Hemorrhage of anus and rectum: Secondary | ICD-10-CM | POA: Diagnosis not present

## 2017-12-03 DIAGNOSIS — Z800 Family history of malignant neoplasm of digestive organs: Secondary | ICD-10-CM | POA: Diagnosis not present

## 2017-12-03 DIAGNOSIS — Z79899 Other long term (current) drug therapy: Secondary | ICD-10-CM | POA: Diagnosis not present

## 2017-12-03 DIAGNOSIS — Z880 Allergy status to penicillin: Secondary | ICD-10-CM | POA: Diagnosis not present

## 2017-12-03 DIAGNOSIS — Z7982 Long term (current) use of aspirin: Secondary | ICD-10-CM | POA: Diagnosis not present

## 2017-12-03 DIAGNOSIS — Z794 Long term (current) use of insulin: Secondary | ICD-10-CM | POA: Diagnosis not present

## 2017-12-03 DIAGNOSIS — E119 Type 2 diabetes mellitus without complications: Secondary | ICD-10-CM | POA: Diagnosis not present

## 2017-12-03 DIAGNOSIS — Z7902 Long term (current) use of antithrombotics/antiplatelets: Secondary | ICD-10-CM | POA: Diagnosis not present

## 2017-12-03 DIAGNOSIS — Z955 Presence of coronary angioplasty implant and graft: Secondary | ICD-10-CM | POA: Diagnosis not present

## 2017-12-03 DIAGNOSIS — I2510 Atherosclerotic heart disease of native coronary artery without angina pectoris: Secondary | ICD-10-CM | POA: Diagnosis not present

## 2017-12-03 DIAGNOSIS — I252 Old myocardial infarction: Secondary | ICD-10-CM | POA: Diagnosis not present

## 2017-12-03 DIAGNOSIS — I10 Essential (primary) hypertension: Secondary | ICD-10-CM | POA: Diagnosis not present

## 2018-01-13 DIAGNOSIS — K921 Melena: Secondary | ICD-10-CM | POA: Diagnosis not present

## 2018-01-13 DIAGNOSIS — I10 Essential (primary) hypertension: Secondary | ICD-10-CM | POA: Diagnosis not present

## 2018-01-13 DIAGNOSIS — I25118 Atherosclerotic heart disease of native coronary artery with other forms of angina pectoris: Secondary | ICD-10-CM | POA: Diagnosis not present

## 2018-01-13 DIAGNOSIS — Z1331 Encounter for screening for depression: Secondary | ICD-10-CM | POA: Diagnosis not present

## 2018-01-13 DIAGNOSIS — E119 Type 2 diabetes mellitus without complications: Secondary | ICD-10-CM | POA: Diagnosis not present

## 2018-01-13 DIAGNOSIS — E782 Mixed hyperlipidemia: Secondary | ICD-10-CM | POA: Diagnosis not present

## 2018-01-23 DIAGNOSIS — F4324 Adjustment disorder with disturbance of conduct: Secondary | ICD-10-CM | POA: Diagnosis not present

## 2018-01-29 DIAGNOSIS — F4324 Adjustment disorder with disturbance of conduct: Secondary | ICD-10-CM | POA: Diagnosis not present

## 2018-02-07 DIAGNOSIS — F4324 Adjustment disorder with disturbance of conduct: Secondary | ICD-10-CM | POA: Diagnosis not present

## 2018-02-14 DIAGNOSIS — F4324 Adjustment disorder with disturbance of conduct: Secondary | ICD-10-CM | POA: Diagnosis not present

## 2018-02-18 DIAGNOSIS — Z23 Encounter for immunization: Secondary | ICD-10-CM | POA: Diagnosis not present

## 2018-03-06 DIAGNOSIS — E113513 Type 2 diabetes mellitus with proliferative diabetic retinopathy with macular edema, bilateral: Secondary | ICD-10-CM | POA: Diagnosis not present

## 2018-03-06 DIAGNOSIS — H40003 Preglaucoma, unspecified, bilateral: Secondary | ICD-10-CM | POA: Diagnosis not present

## 2018-03-06 DIAGNOSIS — H2512 Age-related nuclear cataract, left eye: Secondary | ICD-10-CM | POA: Diagnosis not present

## 2018-03-06 DIAGNOSIS — H2511 Age-related nuclear cataract, right eye: Secondary | ICD-10-CM | POA: Diagnosis not present

## 2018-03-06 LAB — HM DIABETES EYE EXAM

## 2018-03-07 DIAGNOSIS — F4324 Adjustment disorder with disturbance of conduct: Secondary | ICD-10-CM | POA: Diagnosis not present

## 2018-03-11 NOTE — Progress Notes (Signed)
Cardiology Office Note   Date:  03/12/2018   ID:  [name], DOB December 19, 1970, MRN [id number]  PCP:  Kevin Deiters, MD  Cardiologist:   Kevin Rotunda, MD     Chief Complaint  Patient presents with  . Coronary Artery Disease      History of Present Illness: [name] is a 47 y.o. male who presents for follow up of chronic systolic HF.  He has CAD, HTN, HLD, DM, CKD stage III, ICM and noncompliance. He has a history of alcohol abuse as well. He had anterior MI in November 2010 treated with thrombectomy and bare metal stent to mid LAD. He was admitted in April 2018 with chest pain. He underwent diagnostic cardiac catheterization which revealed severe in-stent restenosis in the previously placed LAD stent. Initial attempt at PCI was unsuccessful. This was reviewed by our CTO team, he eventually underwent successful PCI and DES placement of LAD lesion. Echocardiogram obtained on 07/07/2016 showed EF 40-45%, grade 2 diastolic dysfunction, akinesis of the apical anteroseptal and apical myocardium, mild MR. Given the stent placement, he was placed on aspirin and Plavix. He was in the hospital in Jan with chest pain.  He ruled out.  He was to follow up this year but cancelled 3 times.    He returns now with his son.  He has been doing relatively well.  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  He does physical activity. The patient denies any new symptoms such as chest discomfort, neck or arm discomfort. There has been no new shortness of breath, PND or orthopnea. There have been no reported palpitations, presyncope or syncope.  Since January when he was in the emergency room he is not had any further chest pain.  He carries his sublingual nitroglycerin around his neck and he has not had to use any.  Past Medical History:  Diagnosis Date  . Alcohol abuse   . CAD (coronary artery disease), native coronary artery    a. 01/2009 s/p Anterior MI and thrombectomy with BMS to mid LAD;  b.  06/2016 Cath/PCI: LM nl, LAD 11m/d, LCX nl, OM1/2/3 nl, RDA 65m. Initial attempt @ PCI failed followed by successful CTO LAD PCI and DES (2.5x38 Synergy)-->rec for indefinte DAPT.  . CKD (chronic kidney disease), stage III (HCC)   . Essential hypertension   . History of nonadherence to medical treatment   . Hyperlipidemia   . Ischemic cardiomyopathy    a. EF 40% by nuc in 2012;  b. 06/2016 Echo: EF 40-45%, apicalanteroseptal and apical AK, Gr2 DD, mod Ca2+ Ao annulus, mild MR.  [name] Slurred speech - Chronic   . Type 2 diabetes mellitus (HCC)     Past Surgical History:  Procedure Laterality Date  . CORONARY CTO INTERVENTION N/A 07/11/2016   Procedure: Coronary CTO Intervention;  Surgeon: Kevin M Swaziland, MD;  Location: [facility] INVASIVE CV LAB;  Service: Cardiovascular;  Laterality: N/A;  . CORONARY STENT INTERVENTION N/A 07/09/2016   Procedure: Coronary Stent Intervention;  Surgeon: Kevin Gess, MD;  Location: [facility] INVASIVE CV LAB;  Service: Cardiovascular;  Laterality: N/A;  . Knee arthroscopic surgery Right   . LEFT HEART CATH AND CORONARY ANGIOGRAPHY N/A 07/05/2016   Procedure: Left Heart Cath and Coronary Angiography;  Surgeon: Kevin Records, MD;  Location: [facility] INVASIVE CV LAB;  Service: Cardiovascular;  Laterality: N/A;     Current Outpatient Medications  Medication Sig Dispense Refill  . aspirin 81 MG chewable tablet  Chew 81 mg by mouth daily.    [name] atorvastatin (LIPITOR) 80 MG tablet Take 1 tablet (80 mg total) by mouth daily. 30 tablet 0  . citalopram (CELEXA) 40 MG tablet Take 40 mg by mouth daily.    . clopidogrel (PLAVIX) 75 MG tablet Take 1 tablet (75 mg total) by mouth daily. 30 tablet 0  . empagliflozin (JARDIANCE) 10 MG TABS tablet Take 10 mg by mouth daily.    [name] glipiZIDE (GLUCOTROL) 10 MG tablet Take 1 tablet (10 mg total) by mouth daily before breakfast. 30 tablet 0  . insulin glargine (LANTUS) 100 UNIT/ML injection Inject into the skin daily.    [name] losartan (COZAAR) 50 MG tablet  Take 1 tablet (50 mg total) by mouth daily. 90 tablet 3  . nitroGLYCERIN (NITROSTAT) 0.4 MG SL tablet Place 0.4 mg under the tongue every 5 (five) minutes as needed for chest pain.    . pantoprazole (PROTONIX) 20 MG tablet Take 1 tablet (20 mg total) by mouth daily. 30 tablet 0   No current facility-administered medications for this visit.     Allergies:   Penicillins    ROS:  Please see the history of present illness.   Otherwise, review of systems are positive for none.   All other systems are reviewed and negative.    PHYSICAL EXAM: VS:  BP (!) 142/88   Pulse 77   Ht 5\' 9"  (1.753 m)   Wt 171 lb (77.6 kg)   BMI 25.25 kg/m  , BMI Body mass index is 25.25 kg/m. GENERAL:  Well appearing NECK:  No jugular venous distention, waveform within normal limits, carotid upstroke brisk and symmetric, no bruits, no thyromegaly LUNGS:  Clear to auscultation bilaterally CHEST:  Unremarkable HEART:  PMI not displaced or sustained,S1 and S2 within normal limits, no S3, no S4, no clicks, no rubs, no murmurs ABD:  Flat, positive bowel sounds normal in frequency in pitch, no bruits, no rebound, no guarding, no midline pulsatile mass, no hepatomegaly, no splenomegaly EXT:  2 plus pulses throughout, no edema, no cyanosis no clubbing    EKG:  EKG is  ordered today. The ekg ordered today demonstrates Sinus rhythm, rate 77, old inferior infarct, left axis deviation, old anteroseptal infarct, no acute ST-T wave changes.   Recent Labs: 04/21/2017: BUN 16; Creatinine, Ser 1.69; Hemoglobin 14.9; Platelets 221; Potassium 4.3; Sodium 130    Lipid Panel    Component Value Date/Time   CHOL 146 07/27/2016 1039   TRIG 170 (H) 07/27/2016 1039   HDL 62 07/27/2016 1039   CHOLHDL 2.4 07/27/2016 1039   CHOLHDL 2.1 07/06/2016 0534   VLDL 19 07/06/2016 0534   LDLCALC 50 07/27/2016 1039     Lab Results  Component Value Date   HGBA1C 7.3 (H) 08/19/2016    Wt Readings from Last 3 Encounters:  03/12/18  171 lb (77.6 kg)  04/21/17 175 lb (79.4 kg)  10/31/16 177 lb (80.3 kg)      Other studies Reviewed: Additional studies/ Washington that were reviewed today include: ED Washington. Review of the above Washington demonstrates:     ASSESSMENT AND PLAN:   CAD:    The patient has no new sypmtoms.  No further cardiovascular testing is indicated.  We will continue with aggressive risk reduction and meds as listed.    Hypertension: Blood pressure is not at target.  I am going to increase his Cozaar to 50 mg po daily.   Hyperlipidemia:    LDL was  at target.  He will continue the meds as listed.   DM II: A1C was 7.3 in May of last year but 9 most recently.  I have a long discussion with the patient and his son about this.  I am happy to see that he is on Jardiance  CKD stage III:  Creat was stable in June at 1.69.   We discussed renal insufficiency and the need to control blood sugar.  I will check a basic metabolic profile in a couple of weeks.  Ischemic cardiomyopathy:   He has a mildly reduced EF.  I will titrate his meds as above.    Current medicines are reviewed at length with the patient today.  The patient does not have concerns regarding medicines.  The following changes have been made:  As above  Labs/ tests ordered today include:  None  Orders Placed This Encounter  Procedures  . Basic metabolic panel  . EKG 12-Lead     Disposition:   FU with in 3 months.    Signed, Kevin RotundaJames Kohl Polinsky, MD  03/12/2018 3:17 PM    [facility]

## 2018-03-12 ENCOUNTER — Encounter: Payer: Self-pay | Admitting: Cardiology

## 2018-03-12 ENCOUNTER — Ambulatory Visit (INDEPENDENT_AMBULATORY_CARE_PROVIDER_SITE_OTHER): Payer: Medicaid Other | Admitting: Cardiology

## 2018-03-12 VITALS — BP 142/88 | HR 77 | Ht 69.0 in | Wt 171.0 lb

## 2018-03-12 DIAGNOSIS — E118 Type 2 diabetes mellitus with unspecified complications: Secondary | ICD-10-CM | POA: Diagnosis not present

## 2018-03-12 DIAGNOSIS — Z79899 Other long term (current) drug therapy: Secondary | ICD-10-CM

## 2018-03-12 DIAGNOSIS — I10 Essential (primary) hypertension: Secondary | ICD-10-CM

## 2018-03-12 DIAGNOSIS — N183 Chronic kidney disease, stage 3 unspecified: Secondary | ICD-10-CM

## 2018-03-12 DIAGNOSIS — Z794 Long term (current) use of insulin: Secondary | ICD-10-CM | POA: Diagnosis not present

## 2018-03-12 DIAGNOSIS — I2510 Atherosclerotic heart disease of native coronary artery without angina pectoris: Secondary | ICD-10-CM | POA: Diagnosis not present

## 2018-03-12 MED ORDER — LOSARTAN POTASSIUM 50 MG PO TABS: 50.0000 mg | ORAL_TABLET | Freq: Every day | ORAL | 3 refills | Status: DC

## 2018-03-12 NOTE — Patient Instructions (Signed)
Medication Instructions:  Please increase your Cozaar to 50 mg a day. Continue all other medications as listed.  If you need a refill on your cardiac medications before your next appointment, please call your pharmacy.  Please have bloodwork at your primary care doctor's office in 2 weeks.  (BMP)  Follow-Up: Follow up in 3 months with Dr Antoine PocheHochrein.  Thank you for choosing [facility]!!

## 2018-03-14 DIAGNOSIS — F4324 Adjustment disorder with disturbance of conduct: Secondary | ICD-10-CM | POA: Diagnosis not present

## 2018-03-28 DIAGNOSIS — F4324 Adjustment disorder with disturbance of conduct: Secondary | ICD-10-CM | POA: Diagnosis not present

## 2018-04-01 DIAGNOSIS — H2513 Age-related nuclear cataract, bilateral: Secondary | ICD-10-CM | POA: Diagnosis not present

## 2018-04-01 DIAGNOSIS — E113513 Type 2 diabetes mellitus with proliferative diabetic retinopathy with macular edema, bilateral: Secondary | ICD-10-CM | POA: Diagnosis not present

## 2018-04-04 DIAGNOSIS — F4324 Adjustment disorder with disturbance of conduct: Secondary | ICD-10-CM | POA: Diagnosis not present

## 2018-04-11 DIAGNOSIS — F4324 Adjustment disorder with disturbance of conduct: Secondary | ICD-10-CM | POA: Diagnosis not present

## 2018-04-17 DIAGNOSIS — F330 Major depressive disorder, recurrent, mild: Secondary | ICD-10-CM | POA: Diagnosis not present

## 2018-04-17 DIAGNOSIS — E119 Type 2 diabetes mellitus without complications: Secondary | ICD-10-CM | POA: Diagnosis not present

## 2018-04-17 DIAGNOSIS — I10 Essential (primary) hypertension: Secondary | ICD-10-CM | POA: Diagnosis not present

## 2018-04-17 DIAGNOSIS — E782 Mixed hyperlipidemia: Secondary | ICD-10-CM | POA: Diagnosis not present

## 2018-04-18 DIAGNOSIS — F4324 Adjustment disorder with disturbance of conduct: Secondary | ICD-10-CM | POA: Diagnosis not present

## 2018-04-22 DIAGNOSIS — Z01818 Encounter for other preprocedural examination: Secondary | ICD-10-CM | POA: Diagnosis not present

## 2018-04-22 DIAGNOSIS — E113513 Type 2 diabetes mellitus with proliferative diabetic retinopathy with macular edema, bilateral: Secondary | ICD-10-CM | POA: Diagnosis not present

## 2018-04-22 DIAGNOSIS — H2511 Age-related nuclear cataract, right eye: Secondary | ICD-10-CM | POA: Diagnosis not present

## 2018-04-22 DIAGNOSIS — H2512 Age-related nuclear cataract, left eye: Secondary | ICD-10-CM | POA: Diagnosis not present

## 2018-04-25 DIAGNOSIS — F4324 Adjustment disorder with disturbance of conduct: Secondary | ICD-10-CM | POA: Diagnosis not present

## 2018-05-02 DIAGNOSIS — F4324 Adjustment disorder with disturbance of conduct: Secondary | ICD-10-CM | POA: Diagnosis not present

## 2018-05-08 DIAGNOSIS — H25812 Combined forms of age-related cataract, left eye: Secondary | ICD-10-CM | POA: Diagnosis not present

## 2018-05-08 DIAGNOSIS — H2512 Age-related nuclear cataract, left eye: Secondary | ICD-10-CM | POA: Diagnosis not present

## 2018-05-09 DIAGNOSIS — F4324 Adjustment disorder with disturbance of conduct: Secondary | ICD-10-CM | POA: Diagnosis not present

## 2018-05-16 DIAGNOSIS — F4324 Adjustment disorder with disturbance of conduct: Secondary | ICD-10-CM | POA: Diagnosis not present

## 2018-05-19 ENCOUNTER — Encounter: Payer: Self-pay | Admitting: Cardiology

## 2018-06-02 NOTE — Progress Notes (Signed)
Cardiology Office Note   Date:  06/04/2018   ID:  [name], DOB 10/08/1970, MRN [id number]  PCP:  Toma Deiters, MD  Cardiologist:   Rollene Rotunda, MD     Chief Complaint  Patient presents with  . Coronary Artery Disease      History of Present Illness: [name] is a 48 y.o. male who presents for follow up of chronic systolic HF.  He has CAD, HTN, HLD, DM, CKD stage III, ICM and noncompliance. He has a history of alcohol abuse as well. He had anterior MI in November 2010 treated with thrombectomy and bare metal stent to mid LAD. He was admitted in April 2018 with chest pain. He underwent diagnostic cardiac catheterization which revealed severe in-stent restenosis in the previously placed LAD stent. Initial attempt at PCI was unsuccessful. This was reviewed by our CTO team, he eventually underwent successful PCI and DES placement of LAD lesion. Echocardiogram obtained on 07/07/2016 showed EF 40-45%, grade 2 diastolic dysfunction, akinesis of the apical anteroseptal and apical myocardium, mild MR.  He has cancelled the last four appts with me.    Since he was last seen he is done well.  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 There has been no new shortness of breath, PND or orthopnea. There have been no reported palpitations, presyncope or syncope.    Past Medical History:  Diagnosis Date  . Alcohol abuse   . CAD (coronary artery disease), native coronary artery    a. 01/2009 s/p Anterior MI and thrombectomy with BMS to mid LAD;  b. 06/2016 Cath/PCI: LM nl, LAD 20m/d, LCX nl, OM1/2/3 nl, RDA 19m. Initial attempt @ PCI failed followed by successful CTO LAD PCI and DES (2.5x38 Synergy)-->rec for indefinte DAPT.  . CKD (chronic kidney disease), stage III (HCC)   . Essential hypertension   . History of nonadherence to medical treatment   . Hyperlipidemia   . Ischemic cardiomyopathy      a. EF 40% by nuc in 2012;  b. 06/2016 Echo: EF 40-45%, apicalanteroseptal and apical AK, Gr2 DD, mod Ca2+ Ao annulus, mild MR.  [name] Slurred speech - Chronic   . Type 2 diabetes mellitus (HCC)     Past Surgical History:  Procedure Laterality Date  . CORONARY CTO INTERVENTION N/A 07/11/2016   Procedure: Coronary CTO Intervention;  Surgeon: Peter M Swaziland, MD;  Location: [facility] INVASIVE CV LAB;  Service: Cardiovascular;  Laterality: N/A;  . CORONARY STENT INTERVENTION N/A 07/09/2016   Procedure: Coronary Stent Intervention;  Surgeon: Runell Gess, MD;  Location: [facility] INVASIVE CV LAB;  Service: Cardiovascular;  Laterality: N/A;  . Knee arthroscopic surgery Right   . LEFT HEART CATH AND CORONARY ANGIOGRAPHY N/A 07/05/2016   Procedure: Left Heart Cath and Coronary Angiography;  Surgeon: Lyn Records, MD;  Location: [facility] INVASIVE CV LAB;  Service: Cardiovascular;  Laterality: N/A;     Current Outpatient Medications  Medication Sig Dispense Refill  . aspirin 81 MG chewable tablet Chew 81 mg by mouth daily.    [name] atorvastatin (LIPITOR) 80 MG tablet Take 1 tablet (80 mg total) by mouth daily. 30 tablet 0  . citalopram (CELEXA) 40 MG tablet Take 40 mg by mouth daily.    . clopidogrel (PLAVIX) 75 MG tablet Take 1 tablet (75 mg total) by mouth daily. 30 tablet 0  . empagliflozin (JARDIANCE) 10 MG TABS  tablet Take 10 mg by mouth daily.    [name] glipiZIDE (GLUCOTROL) 10 MG tablet Take 1 tablet (10 mg total) by mouth daily before breakfast. 30 tablet 0  . insulin glargine (LANTUS) 100 UNIT/ML injection Inject into the skin daily.    [name] losartan (COZAAR) 50 MG tablet Take 1 tablet (50 mg total) by mouth daily. 90 tablet 3  . nitroGLYCERIN (NITROSTAT) 0.4 MG SL tablet Place 0.4 mg under the tongue every 5 (five) minutes as needed for chest pain.    . pantoprazole (PROTONIX) 20 MG tablet Take 1 tablet (20 mg total) by mouth daily. 30 tablet 0   No current facility-administered medications for this visit.      Allergies:   Penicillins    ROS:  Please see the history of present illness.   Otherwise, review of systems are positive for none.   All other systems are reviewed and negative.    PHYSICAL EXAM: VS:  BP 130/78   Pulse 76   Ht 5\' 9"  (1.753 m)   Wt 173 lb (78.5 kg)   BMI 25.55 kg/m  , BMI Body mass index is 25.55 kg/m. GENERAL:  Well appearing NECK:  No jugular venous distention, waveform within normal limits, carotid upstroke brisk and symmetric, no bruits, no thyromegaly LUNGS:  Clear to auscultation bilaterally CHEST:  Unremarkable HEART:  PMI not displaced or sustained,S1 and S2 within normal limits, no S3, no S4, no clicks, no rubs, no murmurs ABD:  Flat, positive bowel sounds normal in frequency in pitch, no bruits, no rebound, no guarding, no midline pulsatile mass, no hepatomegaly, no splenomegaly EXT:  2 plus pulses throughout, no edema, no cyanosis no clubbing   EKG:  EKG is not  ordered today. The ekg ordered today demonstrates Sinus rhythm, rate 77, old inferior infarct, left axis deviation, old anteroseptal infarct, no acute ST-T wave changes.   Recent Labs: No results found for requested labs within last 8760 hours.    Lipid Panel    Component Value Date/Time   CHOL 146 07/27/2016 1039   TRIG 170 (H) 07/27/2016 1039   HDL 62 07/27/2016 1039   CHOLHDL 2.4 07/27/2016 1039   CHOLHDL 2.1 07/06/2016 0534   VLDL 19 07/06/2016 0534   LDLCALC 50 07/27/2016 1039     Lab Results  Component Value Date   HGBA1C 7.3 (H) 08/19/2016    Wt Readings from Last 3 Encounters:  06/04/18 173 lb (78.5 kg)  03/12/18 171 lb (77.6 kg)  04/21/17 175 lb (79.4 kg)      Other studies Reviewed: Additional studies/ records that were reviewed today include: Labs Review of the above records demonstrates:  See above   ASSESSMENT AND PLAN:   CAD:   The patient has no new sypmtoms.  No further cardiovascular testing is indicated.  We will continue with aggressive risk  reduction and meds as listed.  Hypertension: Blood pressure is at target.  No change in therapy.   Hyperlipidemia:    LDL needs to be checked.  I will order this.   DM II:    This has been very poorly controlled.  .  Last A1c was 7.7.  This is managed by Toma Deiters, MD  I will suggest consideration of a SGLT2i or GLP1ra but will defer to the PCP.  CKD stage III:  Creat was stable in June at 1.69 in Dec.    He would like to establish with nephrology and I will make this referral.  Ischemic cardiomyopathy:  He seems to be euvolemic.  No change in therapy or further imaging at this tie   Current medicines are reviewed at length with the patient today.  The patient does not have concerns regarding medicines.  The following changes have been made:  None  Labs/ tests ordered today include:  None  Orders Placed This Encounter  Procedures  . Ambulatory referral to Nephrology     Disposition:   FU with in 6 months.    Signed, Rollene Rotunda, MD  06/04/2018 3:08 PM    [facility]

## 2018-06-04 ENCOUNTER — Ambulatory Visit (INDEPENDENT_AMBULATORY_CARE_PROVIDER_SITE_OTHER): Payer: Medicaid Other | Admitting: Cardiology

## 2018-06-04 ENCOUNTER — Encounter: Payer: Self-pay | Admitting: Cardiology

## 2018-06-04 ENCOUNTER — Other Ambulatory Visit: Payer: Self-pay

## 2018-06-04 VITALS — BP 130/78 | HR 76 | Ht 69.0 in | Wt 173.0 lb

## 2018-06-04 DIAGNOSIS — I2510 Atherosclerotic heart disease of native coronary artery without angina pectoris: Secondary | ICD-10-CM | POA: Diagnosis not present

## 2018-06-04 DIAGNOSIS — N183 Chronic kidney disease, stage 3 unspecified: Secondary | ICD-10-CM

## 2018-06-04 DIAGNOSIS — I10 Essential (primary) hypertension: Secondary | ICD-10-CM | POA: Diagnosis not present

## 2018-06-04 DIAGNOSIS — I255 Ischemic cardiomyopathy: Secondary | ICD-10-CM | POA: Diagnosis not present

## 2018-06-04 DIAGNOSIS — E785 Hyperlipidemia, unspecified: Secondary | ICD-10-CM

## 2018-06-04 NOTE — Patient Instructions (Signed)
Medication Instructions:  The current medical regimen is effective;  continue present plan and medications.  If you need a refill on your cardiac medications before your next appointment, please call your pharmacy.   Lab work: Please have fasting Lipid profile at your primary care doctor's office.  If you have labs (blood work) drawn today and your tests are completely normal, you will receive your results only by: [name] MyChart Message (if you have MyChart) OR . A paper copy in the mail If you have any lab test that is abnormal or we need to change your treatment, we will call you to review the results.  You have been referred to Nephrology in [place].  You will be called to be scheduled.  Follow-Up: Follow up in 6 months with Dr. Antoine Poche.  You will receive a letter in the mail 2 months before you are due.  Please call us when you receive this letter to schedule your follow up appointment.  Thank you for choosing [facility]!!

## 2018-06-06 ENCOUNTER — Telehealth: Payer: Self-pay | Admitting: *Deleted

## 2018-06-06 NOTE — Telephone Encounter (Signed)
Referral, Dr Jenene Slicker office visit note and labs faxed to [facility] [phone]).  They will call the patient to schedule him.

## 2018-06-11 DIAGNOSIS — E785 Hyperlipidemia, unspecified: Secondary | ICD-10-CM | POA: Diagnosis not present

## 2018-06-17 ENCOUNTER — Telehealth: Payer: Self-pay | Admitting: Cardiology

## 2018-06-17 NOTE — Telephone Encounter (Signed)
Left message for patient on voicemail to make sure he has heard back from [facility].  Advised I faxed his information and referral to them and they were to have called to schedule the appointment.  Advised to c/b if any questions or concerns.

## 2018-06-17 NOTE — Telephone Encounter (Signed)
Called [facility] - they have not scheduled patient yet as the MD needs more recent lab work.  Spoke with wife- pt had lab work as ordered last week at PCP office.  Called Dr Bartholomew Crews office.  They report they faxed the results yesterday but will refax to my attention.

## 2018-06-17 NOTE — Telephone Encounter (Signed)
Follow Up:    Pt's wife called and said they have not heard anything from them.

## 2018-06-17 NOTE — Telephone Encounter (Signed)
Obtained most recent BMP from PCP and faxed to [facility] at their request.

## 2018-06-25 ENCOUNTER — Telehealth: Payer: Self-pay | Admitting: Cardiology

## 2018-06-25 NOTE — Telephone Encounter (Signed)
[name] S 48 minutes ago (3:05 PM)       [name] is calling [name] back to let her know that Dr Kathrene Bongo is reviewing the referral and if they need more updated labs they will call referring office to notify      Documentation   Noted.

## 2018-06-25 NOTE — Telephone Encounter (Signed)
noted 

## 2018-06-25 NOTE — Telephone Encounter (Signed)
  [name] is calling [name] back to let her know that Dr Kathrene Bongo is reviewing the referral and if they need more updated labs they will call referring office to notify

## 2018-07-17 DIAGNOSIS — I10 Essential (primary) hypertension: Secondary | ICD-10-CM | POA: Diagnosis not present

## 2018-07-17 DIAGNOSIS — E1143 Type 2 diabetes mellitus with diabetic autonomic (poly)neuropathy: Secondary | ICD-10-CM | POA: Diagnosis not present

## 2018-07-17 DIAGNOSIS — F330 Major depressive disorder, recurrent, mild: Secondary | ICD-10-CM | POA: Diagnosis not present

## 2018-07-17 DIAGNOSIS — E782 Mixed hyperlipidemia: Secondary | ICD-10-CM | POA: Diagnosis not present

## 2018-08-14 ENCOUNTER — Other Ambulatory Visit: Payer: Self-pay | Admitting: Cardiology

## 2018-08-14 DIAGNOSIS — I10 Essential (primary) hypertension: Secondary | ICD-10-CM | POA: Diagnosis not present

## 2018-08-14 DIAGNOSIS — R079 Chest pain, unspecified: Secondary | ICD-10-CM | POA: Diagnosis not present

## 2018-08-14 DIAGNOSIS — R0689 Other abnormalities of breathing: Secondary | ICD-10-CM | POA: Diagnosis not present

## 2018-08-14 DIAGNOSIS — E1165 Type 2 diabetes mellitus with hyperglycemia: Secondary | ICD-10-CM | POA: Diagnosis not present

## 2018-08-14 NOTE — Telephone Encounter (Signed)
New Message    *STAT* If patient is at the pharmacy, call can be transferred to refill team.   1. Which medications need to be refilled? (please list name of each medication and dose if known) nitroGLYCERIN (NITROSTAT) 0.4 MG SL tablet    2. Which pharmacy/location (including street and city if local pharmacy) is medication to be sent to? [facility]  3. Do they need a 30 day or 90 day supply? 90

## 2018-08-15 MED ORDER — NITROGLYCERIN 0.4 MG SL SUBL: 0.4000 mg | SUBLINGUAL_TABLET | SUBLINGUAL | 2 refills | Status: DC | PRN

## 2018-08-22 DIAGNOSIS — H2511 Age-related nuclear cataract, right eye: Secondary | ICD-10-CM | POA: Diagnosis not present

## 2018-08-22 DIAGNOSIS — Z01818 Encounter for other preprocedural examination: Secondary | ICD-10-CM | POA: Diagnosis not present

## 2018-09-04 DIAGNOSIS — H2511 Age-related nuclear cataract, right eye: Secondary | ICD-10-CM | POA: Diagnosis not present

## 2018-09-04 DIAGNOSIS — H25811 Combined forms of age-related cataract, right eye: Secondary | ICD-10-CM | POA: Diagnosis not present

## 2018-10-05 DIAGNOSIS — H5213 Myopia, bilateral: Secondary | ICD-10-CM | POA: Diagnosis not present

## 2018-12-18 DIAGNOSIS — E1143 Type 2 diabetes mellitus with diabetic autonomic (poly)neuropathy: Secondary | ICD-10-CM | POA: Diagnosis not present

## 2018-12-18 DIAGNOSIS — I10 Essential (primary) hypertension: Secondary | ICD-10-CM | POA: Diagnosis not present

## 2018-12-18 DIAGNOSIS — F330 Major depressive disorder, recurrent, mild: Secondary | ICD-10-CM | POA: Diagnosis not present

## 2018-12-18 DIAGNOSIS — E782 Mixed hyperlipidemia: Secondary | ICD-10-CM | POA: Diagnosis not present

## 2019-01-05 ENCOUNTER — Telehealth: Payer: Self-pay | Admitting: *Deleted

## 2019-01-05 NOTE — Telephone Encounter (Signed)
[name] is unavailable at this time.

## 2019-03-11 ENCOUNTER — Ambulatory Visit: Payer: Medicaid Other | Admitting: Cardiology

## 2019-03-29 DIAGNOSIS — Z7189 Other specified counseling: Secondary | ICD-10-CM | POA: Insufficient documentation

## 2019-03-29 DIAGNOSIS — I255 Ischemic cardiomyopathy: Secondary | ICD-10-CM | POA: Insufficient documentation

## 2019-03-29 NOTE — Progress Notes (Signed)
Cardiology Office Note   Date:  04/01/2019   ID:  [name], DOB November 03, 1970, MRN [id number]  PCP:  Neale Burly, MD  Cardiologist:   [name], MD     Chief Complaint  Patient presents with  . Chest Pain      History of Present Illness: [name] is a 49 y.o. male who presents for follow up of chronic systolic HF.  He has CAD, HTN, HLD, DM, CKD stage III, ICM and noncompliance. He has a history of alcohol abuse as well. He had anterior MI in November 2010 treated with thrombectomy and bare metal stent to mid LAD. He was admitted in April 2018 with chest pain. He underwent diagnostic cardiac catheterization which revealed severe in-stent restenosis in the previously placed LAD stent. Initial attempt at PCI was unsuccessful. This was reviewed by our CTO team, he eventually underwent successful PCI and DES placement of LAD lesion. Echocardiogram obtained on 07/07/2016 showed EF 10-93%, grade 2 diastolic dysfunction, akinesis of the apical anteroseptal and apical myocardium, mild MR.    Since I last saw him last couple of weeks he has had some increased chest discomfort.  He was not having this after his last PCI.  However, the discomfort that he was having previously has now returned although much more mild.  He had taken a couple of nitroglycerin each of the past few weeks.  Occasionally has had to take 2.  It happens at rest.  He describes a substernal discomfort that is about 3 out of 10 in intensity.  There is no radiation.  There are no associated symptoms such as nausea vomiting or diaphoresis.  He is not describing palpitations, presyncope or syncope.  He has had no weight gain or edema.   Past Medical History:  Diagnosis Date  . Alcohol abuse   . CAD (coronary artery disease), native coronary artery    a. 01/2009 s/p Anterior MI and thrombectomy with BMS to mid LAD;  b. 06/2016 Cath/PCI: LM nl, LAD 61m/d, LCX nl, OM1/2/3 nl, RDA 49m. Initial attempt @ PCI failed followed  by successful CTO LAD PCI and DES (2.5x38 Synergy)-->rec for indefinte DAPT.  . CKD (chronic kidney disease), stage III   . Essential hypertension   . History of nonadherence to medical treatment   . Hyperlipidemia   . Ischemic cardiomyopathy    a. EF 40% by nuc in 2012;  b. 06/2016 Echo: EF 40-45%, apicalanteroseptal and apical AK, Gr2 DD, mod Ca2+ Ao annulus, mild MR.  [name] Slurred speech - Chronic   . Type 2 diabetes mellitus ([place])     Past Surgical History:  Procedure Laterality Date  . CORONARY CTO INTERVENTION N/A 07/11/2016   Procedure: Coronary CTO Intervention;  Surgeon: Peter M Martinique, MD;  Location: [place] [facility];  Service: Cardiovascular;  Laterality: N/A;  . CORONARY STENT INTERVENTION N/A 07/09/2016   Procedure: Coronary Stent Intervention;  Surgeon: Lorretta Harp, MD;  Location: [place] [facility];  Service: Cardiovascular;  Laterality: N/A;  . Knee arthroscopic surgery Right   . LEFT HEART CATH AND CORONARY ANGIOGRAPHY N/A 07/05/2016   Procedure: Left Heart Cath and Coronary Angiography;  Surgeon: Belva Crome, MD;  Location: [place] [facility];  Service: Cardiovascular;  Laterality: N/A;     Current Outpatient Medications  Medication Sig Dispense Refill  . aspirin 81 MG chewable tablet Chew 81 mg by mouth daily.    [name] atorvastatin (LIPITOR) 80 MG tablet Take 1 tablet (  80 mg total) by mouth daily. 30 tablet 0  . citalopram (CELEXA) 40 MG tablet Take 40 mg by mouth daily.    . clopidogrel (PLAVIX) 75 MG tablet Take 1 tablet (75 mg total) by mouth daily. 30 tablet 0  . empagliflozin (JARDIANCE) 10 MG TABS tablet Take 10 mg by mouth daily.    [name] glipiZIDE (GLUCOTROL) 10 MG tablet Take 1 tablet (10 mg total) by mouth daily before breakfast. 30 tablet 0  . insulin glargine (LANTUS) 100 UNIT/ML injection Inject into the skin daily.    [name] losartan (COZAAR) 50 MG tablet Take 1 tablet (50 mg total) by mouth daily. 90 tablet 3  . nitroGLYCERIN (NITROSTAT) 0.4 MG SL tablet Place  1 tablet (0.4 mg total) under the tongue every 5 (five) minutes as needed for chest pain. 25 tablet 2  . pantoprazole (PROTONIX) 20 MG tablet Take 1 tablet (20 mg total) by mouth daily. 30 tablet 0  . isosorbide mononitrate (IMDUR) 60 MG 24 hr tablet Take 1 tablet (60 mg total) by mouth daily. 90 tablet 3   No current facility-administered medications for this visit.    Allergies:   Penicillins    ROS:  Please see the history of present illness.   Otherwise, review of systems are positive for none.   All other systems are reviewed and negative.    PHYSICAL EXAM: VS:  BP (!) 152/100   Pulse 78   Ht 5\' 9"  (1.753 m)   Wt 158 lb (71.7 kg)   BMI 23.33 kg/m  , BMI Body mass index is 23.33 kg/m. GENERAL:  Well appearing NECK:  No jugular venous distention, waveform within normal limits, carotid upstroke brisk and symmetric, no bruits, no thyromegaly LUNGS:  Clear to auscultation bilaterally CHEST:  Unremarkable HEART:  PMI not displaced or sustained,S1 and S2 within normal limits, no S3, no S4, no clicks, no rubs, no murmurs ABD:  Flat, positive bowel sounds normal in frequency in pitch, no bruits, no rebound, no guarding, no midline pulsatile mass, no hepatomegaly, no splenomegaly EXT:  2 plus pulses throughout, no edema, no cyanosis no clubbing   EKG:  EKG is  ordered today. The ekg ordered today demonstrates Sinus rhythm, rate 78, old inferior infarct, left axis deviation, old anteroseptal infarct, no acute ST-T wave changes.  I reviewed previous EKGs and he has had this ST elevation and T wave inversions previously from his old anteroseptal infarct.  This does not represent a significant change.   Recent Labs: No results found for requested labs within last 8760 hours.    Lipid Panel    Component Value Date/Time   CHOL 146 07/27/2016 1039   TRIG 170 (H) 07/27/2016 1039   HDL 62 07/27/2016 1039   CHOLHDL 2.4 07/27/2016 1039   CHOLHDL 2.1 07/06/2016 0534   VLDL 19 07/06/2016  0534   LDLCALC 50 07/27/2016 1039     Lab Results  Component Value Date   HGBA1C 7.3 (H) 08/19/2016    Wt Readings from Last 3 Encounters:  04/01/19 158 lb (71.7 kg)  06/04/18 173 lb (78.5 kg)  03/12/18 171 lb (77.6 kg)      Other studies Reviewed: Additional studies/ records that were reviewed today include: Cath films reviewed with the patient.  Review of the above records demonstrates:  See below   ASSESSMENT AND PLAN:   CAD:   I reviewed the patient's cath films in fact reviewing the with him today.  A couple of years ago  he had a complicated procedure to open a chronically occluded LAD because he had continued symptoms.  He had diffuse narrowing in that vessel which was on top of the previously infarcted anterior wall.  He could well have reocclusion in the stent that was treated at that time.  However, I do not suspect he has a large area of viable myocardium risk.  I do think that any attempted percutaneous revascularization would be for symptom benefit only.  He has renal insufficiency making him high risk for intervention.  Given this he and I discussed medical management first and he agrees.  I am going to start with Imdur 60 mg but he will let me know if he has any discomfort and I would have a low threshold for repeat angiography.  Hypertension: Blood pressure is at target.  No change in therapy.   Hyperlipidemia:   LDL is 50, HDL 45.  DM II:    This has been very poorly controlled.  .  Last A1c was 9.6 which was up from 7.7.  This is managed by Toma Deiters, MD  I will suggest consideration of a SGLT2i or GLP1ra but will defer to the PCP.  CKD stage III:  Creat was 1.6 and stable in September.   Ischemic cardiomyopathy: He seems to be euvolemic.  No change in therapy.   Current medicines are reviewed at length with the patient today.  The patient does not have concerns regarding medicines.  The following changes have been made:  None  Labs/ tests ordered today  include:  None  Orders Placed This Encounter  Procedures  . EKG 12-Lead     Disposition:   FU with in 1 months.    Signed, Rollene Rotunda, MD  04/01/2019 1:41 PM    [facility]

## 2019-04-01 ENCOUNTER — Ambulatory Visit: Payer: Medicaid Other | Admitting: Cardiology

## 2019-04-01 ENCOUNTER — Encounter: Payer: Self-pay | Admitting: Cardiology

## 2019-04-01 ENCOUNTER — Other Ambulatory Visit: Payer: Self-pay

## 2019-04-01 VITALS — BP 152/100 | HR 78 | Ht 69.0 in | Wt 158.0 lb

## 2019-04-01 DIAGNOSIS — E785 Hyperlipidemia, unspecified: Secondary | ICD-10-CM | POA: Diagnosis not present

## 2019-04-01 DIAGNOSIS — I10 Essential (primary) hypertension: Secondary | ICD-10-CM | POA: Diagnosis not present

## 2019-04-01 DIAGNOSIS — I255 Ischemic cardiomyopathy: Secondary | ICD-10-CM

## 2019-04-01 DIAGNOSIS — I25110 Atherosclerotic heart disease of native coronary artery with unstable angina pectoris: Secondary | ICD-10-CM

## 2019-04-01 DIAGNOSIS — N1832 Chronic kidney disease, stage 3b: Secondary | ICD-10-CM | POA: Diagnosis not present

## 2019-04-01 MED ORDER — ISOSORBIDE MONONITRATE ER 60 MG PO TB24: 60.0000 mg | ORAL_TABLET | Freq: Every day | ORAL | 3 refills | Status: DC

## 2019-04-01 NOTE — Patient Instructions (Signed)
Medication Instructions:  Please start Isosorbide 60 mg a day. The current medical regimen is effective;  continue present plan and medications.   *If you need a refill on your cardiac medications before your next appointment, please call your pharmacy*  Follow-Up: At [facility], you and your health needs are our priority.  As part of our continuing mission to provide you with exceptional heart care, we have created designated Provider Care Teams.  These Care Teams include your primary Cardiologist (physician) and Advanced Practice Providers (APPs -  Physician Assistants and Nurse Practitioners) who all work together to provide you with the care you need, when you need it.  Your next appointment:   4 week(s)  The format for your next appointment:   In Person  Provider:   Rollene Rotunda, MD  Thank you for choosing [facility]!!

## 2019-04-04 DIAGNOSIS — Z79899 Other long term (current) drug therapy: Secondary | ICD-10-CM | POA: Diagnosis not present

## 2019-04-04 DIAGNOSIS — E1165 Type 2 diabetes mellitus with hyperglycemia: Secondary | ICD-10-CM | POA: Diagnosis not present

## 2019-04-04 DIAGNOSIS — Z7982 Long term (current) use of aspirin: Secondary | ICD-10-CM | POA: Diagnosis not present

## 2019-04-04 DIAGNOSIS — I252 Old myocardial infarction: Secondary | ICD-10-CM | POA: Diagnosis not present

## 2019-04-04 DIAGNOSIS — R0789 Other chest pain: Secondary | ICD-10-CM | POA: Diagnosis not present

## 2019-04-04 DIAGNOSIS — Z794 Long term (current) use of insulin: Secondary | ICD-10-CM | POA: Diagnosis not present

## 2019-04-04 DIAGNOSIS — R079 Chest pain, unspecified: Secondary | ICD-10-CM | POA: Diagnosis not present

## 2019-04-04 DIAGNOSIS — Z91030 Bee allergy status: Secondary | ICD-10-CM | POA: Diagnosis not present

## 2019-04-04 DIAGNOSIS — E119 Type 2 diabetes mellitus without complications: Secondary | ICD-10-CM | POA: Diagnosis not present

## 2019-04-04 DIAGNOSIS — I119 Hypertensive heart disease without heart failure: Secondary | ICD-10-CM | POA: Diagnosis not present

## 2019-04-04 DIAGNOSIS — K219 Gastro-esophageal reflux disease without esophagitis: Secondary | ICD-10-CM | POA: Diagnosis not present

## 2019-04-04 DIAGNOSIS — I209 Angina pectoris, unspecified: Secondary | ICD-10-CM | POA: Diagnosis not present

## 2019-04-04 DIAGNOSIS — Z880 Allergy status to penicillin: Secondary | ICD-10-CM | POA: Diagnosis not present

## 2019-04-04 DIAGNOSIS — Z955 Presence of coronary angioplasty implant and graft: Secondary | ICD-10-CM | POA: Diagnosis not present

## 2019-04-17 ENCOUNTER — Telehealth: Payer: Self-pay | Admitting: Cardiology

## 2019-04-17 ENCOUNTER — Ambulatory Visit: Payer: Medicaid Other | Admitting: Medical

## 2019-04-17 ENCOUNTER — Other Ambulatory Visit: Payer: Self-pay

## 2019-04-17 ENCOUNTER — Encounter: Payer: Self-pay | Admitting: Medical

## 2019-04-17 ENCOUNTER — Encounter: Payer: Self-pay | Admitting: *Deleted

## 2019-04-17 ENCOUNTER — Encounter (INDEPENDENT_AMBULATORY_CARE_PROVIDER_SITE_OTHER): Payer: Self-pay

## 2019-04-17 VITALS — BP 130/80 | HR 82 | Ht 69.0 in | Wt 171.0 lb

## 2019-04-17 DIAGNOSIS — N1832 Chronic kidney disease, stage 3b: Secondary | ICD-10-CM | POA: Diagnosis not present

## 2019-04-17 DIAGNOSIS — I25110 Atherosclerotic heart disease of native coronary artery with unstable angina pectoris: Secondary | ICD-10-CM | POA: Diagnosis not present

## 2019-04-17 DIAGNOSIS — Z794 Long term (current) use of insulin: Secondary | ICD-10-CM

## 2019-04-17 DIAGNOSIS — I255 Ischemic cardiomyopathy: Secondary | ICD-10-CM

## 2019-04-17 DIAGNOSIS — I10 Essential (primary) hypertension: Secondary | ICD-10-CM

## 2019-04-17 DIAGNOSIS — E782 Mixed hyperlipidemia: Secondary | ICD-10-CM

## 2019-04-17 DIAGNOSIS — E118 Type 2 diabetes mellitus with unspecified complications: Secondary | ICD-10-CM

## 2019-04-17 DIAGNOSIS — I5040 Unspecified combined systolic (congestive) and diastolic (congestive) heart failure: Secondary | ICD-10-CM | POA: Diagnosis not present

## 2019-04-17 MED ORDER — CARVEDILOL 3.125 MG PO TABS: 3.1250 mg | ORAL_TABLET | Freq: Two times a day (BID) | ORAL | 3 refills | Status: DC

## 2019-04-17 NOTE — Patient Instructions (Addendum)
Medication Instructions:   START TAKING CARVEDILOL 3.125 MG TWICE A DAY   *If you need a refill on your cardiac medications before your next appointment, please call your pharmacy*  Lab Work:  CBC AND BMET TODAY   If you have labs (blood work) drawn today and your tests are completely normal, you will receive your results only by: [name] MyChart Message (if you have MyChart) OR . A paper copy in the mail If you have any lab test that is abnormal or we need to change your treatment, we will call you to review the results.  Testing/Procedures:  SEE LETTER FOR LEFT HEART. Your physician has requested that you have a cardiac catheterization. Cardiac catheterization is used to diagnose and/or treat various heart conditions. Doctors may recommend this procedure for a number of different reasons. The most common reason is to evaluate chest pain. Chest pain can be a symptom of coronary artery disease (CAD), and cardiac catheterization can show whether plaque is narrowing or blocking your heart's arteries. This procedure is also used to evaluate the valves, as well as measure the blood flow and oxygen levels in different parts of your heart. For further information please visit [email or web address] Please follow instruction sheet, as given.  Your physician has requested that you have an echocardiogram. Echocardiography is a painless test that uses sound waves to create images of your heart. It provides your doctor with information about the size and shape of your heart and how well your heart's chambers and valves are working. This procedure takes approximately one hour. There are no restrictions for this procedure.   Follow-Up: At [facility], you and your health needs are our priority.  As part of our continuing mission to provide you with exceptional heart care, we have created designated Provider Care Teams.  These Care Teams include your primary Cardiologist (physician) and Advanced Practice Providers  (APPs -  Physician Assistants and Nurse Practitioners) who all work together to provide you with the care you need, when you need it.  Your next appointment:   1-2  week(s) AFTER LEFT HEART CATH 04-24-2019   The format for your next appointment:   In Person  Provider:   You may see Rollene Rotunda, MD or one of the following Advanced Practice Providers on your designated Care Team:    [facility]   Other Instructions

## 2019-04-17 NOTE — Telephone Encounter (Signed)
Attempted outreach to [name].  Received message "phone call cannot be completed as dialed"  Attempted to call Pt.  Line is busy.

## 2019-04-17 NOTE — Telephone Encounter (Signed)
Stents may be coimg out.  Pt c/o of Chest Pain: STAT if CP now or developed within 24 hours  1. Are you having CP right now? no  2. Are you experiencing any other symptoms (ex. SOB, nausea, vomiting, sweating)? no  3. How long have you been experiencing CP? Last few days  4. Is your CP continuous or coming and going? Comes and goes  5. Have you taken Nitroglycerin? Yes  Patient's wife states they were told to call if the patient had chest pain because his stents might be stopping up. They would like a call back for advice.    ?

## 2019-04-17 NOTE — Telephone Encounter (Signed)
Contacted Pt mother per DPR.  Phone number given for Pt's son.  Pt's son lives with Pt and was able to speak with wife.  Wife will see if they can get transportation to appt with K. Kroeger today.  Will return call in 10 minutes.  Held 2 pm spot.

## 2019-04-17 NOTE — Telephone Encounter (Signed)
Pt will be able to come to see Dot Lanes today at [facility] 1/22 at 2:00 pm to reassess continued chest discomfort after starting imdur.

## 2019-04-17 NOTE — Progress Notes (Signed)
Cardiology Office Note   Date:  04/17/2019   ID:  [name], DOB February 29, 1971, MRN [id number]  PCP:  Toma Deiters, MD  Cardiologist:  Rollene Rotunda, MD EP: None  Chief Complaint  Patient presents with  . Chest Pain      History of Present Illness: [name] is a 49 y.o. male with PMH of CAD s/p BMS to mLAD 2010 with subsequent ISR managed with PCI/DES to LAD in 2018, chronic combined CHF, ischemic cardiomyopathy, HTN, HLD, DM type 2, CKD stage 3, and non-compliance, who presents for the evaluation of chest pain.  He was last seen by cardiology at an outpatient visit with Dr. Antoine Poche 04/01/19, at which time he had complaints of increased chest discomfort and SL nitro use. Dr. Antoine Poche reviewed his cath films and felt that he likely did not have a large area of viable myocardium and any attempts at percutaneous revascularization would be fore symptoms benefit only and with his renal insufficiency, he would be high risk for intervention. He was started on imdur 60mg  daily for medical management with a low threshold for repeat angiography for recurrent symptoms. He called our office today (04/17/19) with complaints of ongoing chest discomfort, for which this appointment was scheduled.   His last ischemic evaluation was a [facility] 06/2016 which revealed severe m-dLAD stenosis with diffuse ISR felt to be high risk for PCI. He then underwent a repeat cath from a femoral approach, however attempts at PCI were unsuccessful and he subsequently underwent CTO intervention 07/11/16 with successful CTO PCI/DES of mLAD. His last echocardiogram 06/2016 showed EF 40-45%, G2DD, akinesis of the apicalanteroseptal and apical myocardium, and mild MR.   He returns today with his mother. He reports continued chest discomfort which occurs at rest, though is reminiscent of angina experienced prior to coronary artery interventions in the past, albeit not as intense. He presented to an OSH ED 1 week ago for chest pain  complaints and was discharged from the ED after a presumed negative work-up. This morning he reports being awoken from sleep with chest pain. No complaints of associated SOB, diaphoresis, dizziness, lightheadedness, N/V, or syncope. No recent complaints of exertional chest pain, DOE, LE edema, orthopnea, or PND.     Past Medical History:  Diagnosis Date  . Alcohol abuse   . CAD (coronary artery disease), native coronary artery    a. 01/2009 s/p Anterior MI and thrombectomy with BMS to mid LAD;  b. 06/2016 Cath/PCI: LM nl, LAD 47m/d, LCX nl, OM1/2/3 nl, RDA 30m. Initial attempt @ PCI failed followed by successful CTO LAD PCI and DES (2.5x38 Synergy)-->rec for indefinte DAPT.  . CKD (chronic kidney disease), stage III   . Essential hypertension   . History of nonadherence to medical treatment   . Hyperlipidemia   . Ischemic cardiomyopathy    a. EF 40% by nuc in 2012;  b. 06/2016 Echo: EF 40-45%, apicalanteroseptal and apical AK, Gr2 DD, mod Ca2+ Ao annulus, mild MR.  10-12-1981 Slurred speech - Chronic   . Type 2 diabetes mellitus ([facility])     Past Surgical History:  Procedure Laterality Date  . CORONARY CTO INTERVENTION N/A 07/11/2016   Procedure: Coronary CTO Intervention;  Surgeon: Peter M 07/13/2016, MD;  [facility];  Service: Cardiovascular;  Laterality: N/A;  . CORONARY STENT INTERVENTION N/A 07/09/2016   Procedure: Coronary Stent Intervention;  Surgeon: 07/11/2016, MD;  Location: [facility];  Service: Cardiovascular;  Laterality: N/A;  .  Knee arthroscopic surgery Right   . LEFT HEART CATH AND CORONARY ANGIOGRAPHY N/A 07/05/2016   Procedure: Left Heart Cath and Coronary Angiography;  Surgeon: Lyn Records, MD;  Location: [facility]ASIVE [facility] LAB;  Service: Cardiovascular;  Laterality: N/A;     Current Outpatient Medications  Medication Sig Dispense Refill  . aspirin 81 MG chewable tablet Chew 81 mg by mouth daily.    [name] atorvastatin (LIPITOR) 80 MG tablet Take 1 tablet (80  mg total) by mouth daily. 30 tablet 0  . citalopram (CELEXA) 40 MG tablet Take 40 mg by mouth daily.    . clopidogrel (PLAVIX) 75 MG tablet Take 1 tablet (75 mg total) by mouth daily. 30 tablet 0  . empagliflozin (JARDIANCE) 10 MG TABS tablet Take 10 mg by mouth daily.    [name] glipiZIDE (GLUCOTROL) 10 MG tablet Take 1 tablet (10 mg total) by mouth daily before breakfast. 30 tablet 0  . insulin glargine (LANTUS) 100 UNIT/ML injection Inject into the skin daily.    . isosorbide mononitrate (IMDUR) 60 MG 24 hr tablet Take 1 tablet (60 mg total) by mouth daily. 90 tablet 3  . losartan (COZAAR) 50 MG tablet Take 1 tablet (50 mg total) by mouth daily. 90 tablet 3  . nitroGLYCERIN (NITROSTAT) 0.4 MG SL tablet Place 1 tablet (0.4 mg total) under the tongue every 5 (five) minutes as needed for chest pain. 25 tablet 2  . pantoprazole (PROTONIX) 20 MG tablet Take 1 tablet (20 mg total) by mouth daily. 30 tablet 0  . carvedilol (COREG) 3.125 MG tablet Take 1 tablet (3.125 mg total) by mouth 2 (two) times daily. 180 tablet 3   No current facility-administered medications for this visit.    Allergies:   Penicillins    Social History:  The patient  reports that he has never smoked. He has never used smokeless tobacco. He reports that he does not drink alcohol or use drugs.   Family History:  The patient's family history includes Diabetes Mellitus II in an other family member; Heart disease in his mother.    ROS:  Please see the history of present illness.   Otherwise, review of systems are positive for none.   All other systems are reviewed and negative.    PHYSICAL EXAM: VS:  BP 130/80   Pulse 82   Ht 5\' 9"  (1.753 m)   Wt 171 lb (77.6 kg)   SpO2 97%   BMI 25.25 kg/m  , BMI Body mass index is 25.25 kg/m. GEN: Well nourished, well developed, in no acute distress HEENT: sclera anicteric Neck: no JVD, carotid bruits, or masses Cardiac: RRR; no murmurs, rubs, or gallops, no edema  Respiratory:   clear to auscultation bilaterally, normal work of breathing GI: soft, nontender, nondistended, + BS MS: no deformity or atrophy Skin: warm and dry, no rash Neuro:  Strength and sensation are intact Psych: euthymic mood, full affect   EKG:  EKG is ordered today. The ekg ordered today demonstrates NSR, rate 82 bpm, inferior Q waves, submm STE in anterior leads, no TWI; EKG appears improved from 04/01/19 in regards to STE and TWI.   Recent Labs: No results found for requested labs within last 8760 hours.    Lipid Panel    Component Value Date/Time   CHOL 146 07/27/2016 1039   TRIG 170 (H) 07/27/2016 1039   HDL 62 07/27/2016 1039   CHOLHDL 2.4 07/27/2016 1039   CHOLHDL 2.1 07/06/2016 0534   VLDL 19  07/06/2016 0534   LDLCALC 50 07/27/2016 1039      Wt Readings from Last 3 Encounters:  04/17/19 171 lb (77.6 kg)  04/01/19 158 lb (71.7 kg)  06/04/18 173 lb (78.5 kg)      Other studies Reviewed: Additional studies/ records that were reviewed today include:   Left heart catheterization 07/05/2016:  Mid RCA lesion, 60 %stenosed.  Mid LAD to Dist LAD lesion, 99 %stenosed.  The left ventricular ejection fraction is 35-45% by visual estimate.    Diffuse in-stent restenosis, 99%, of the stent placed in the mid LAD beyond the second diagonal and first septal perforator. The has features that suggest total occlusion.   50-60% mid RCA stenosis.  Small, widely patent circumflex system.  Distal anterior wall and apical akinesis. LVEF 40%  RECOMMENDATIONS:   Unable to safely attempt PCI on the LAD due to difficulty with guide catheter engagement and stability.  Consider PCI from the femoral approach in a.m. versus revascularization via the CTO team. Will review images. Tentatively scheduled for right femoral PCI tomorrow. I have not yet given dual antiplatelet therapy.  Coronary CTO intervention 07/11/2016    ASSESSMENT AND PLAN:  1. Progressive angina in patient with  CAD s/p BMS to LAD in 2010 with subsequent ISR managed with CTO PCI/DES to LAD in 2018: he has had continued chest discomfort despite starting imdur 60mg  daily 04/01/19. EKG today with improvement in anterior STE and lateral TWI seen previously 04/01/19. Given ongoing discomfort, feel definitive evaluation is necessary. Plan discussed with Dr. Percival Spanish in office today. - Will plan for LHC. The patient understands that risks included but are not limited to stroke (1 in 1000), death (1 in 36), kidney failure [usually temporary] (1 in 500), bleeding (1 in 200), allergic reaction [possibly serious] (1 in 200).  The patient understands and agrees to proceed.  - Will update his echocardiogram - Will restart carvedilol 3.125mg  BID - Continue aspirin and plavix - Continue imdur - Continue atorvastatin  2. Chronic combined CHF/ischemic cardiomyopathy: no volume overload complaints and he appears euvolemic today. EF 40-45% on echo in 2018 - Will update his echocardiogram - Will restart carvedilol 3.125mg  BID - Continue losartan   3. HTN: BP 130/80 today; goal <130/80 - Will restart carvedilol 3.125mg  BID - Continue losartan and imdur  4. HLD: LDL 39 05/2018; at goal of <70 - Continue atorvastatin  5. DM type 2: poorly controlled - A1C 9.6 11/2018; goal <7. Managed by PCP  - Continue insulin, jardiance, and glipizide  6. CKD stage 3: Cr 1.6 11/2018 - Will plan to bring him in early for hydration   Current medicines are reviewed at length with the patient today.  The patient does not have concerns regarding medicines.  The following changes have been made:  As above  Labs/ tests ordered today include:   Orders Placed This Encounter  Procedures  . Basic metabolic panel  . CBC  . EKG 12-Lead  . ECHOCARDIOGRAM COMPLETE     Disposition:   FU with myself or Dr. Percival Spanish in 2 weeks  Signed, Abigail Butts, PA-C  04/17/2019 4:02 PM

## 2019-04-17 NOTE — H&P (View-Only) (Signed)
Cardiology Office Note   Date:  04/17/2019   ID:  [name], DOB February 29, 1971, MRN [id number]  PCP:  Toma Deiters, MD  Cardiologist:  Rollene Rotunda, MD EP: None  Chief Complaint  Patient presents with  . Chest Pain      History of Present Illness: [name] is a 49 y.o. male with PMH of CAD s/p BMS to mLAD 2010 with subsequent ISR managed with PCI/DES to LAD in 2018, chronic combined CHF, ischemic cardiomyopathy, HTN, HLD, DM type 2, CKD stage 3, and non-compliance, who presents for the evaluation of chest pain.  He was last seen by cardiology at an outpatient visit with Dr. Antoine Poche 04/01/19, at which time he had complaints of increased chest discomfort and SL nitro use. Dr. Antoine Poche reviewed his cath films and felt that he likely did not have a large area of viable myocardium and any attempts at percutaneous revascularization would be fore symptoms benefit only and with his renal insufficiency, he would be high risk for intervention. He was started on imdur 60mg  daily for medical management with a low threshold for repeat angiography for recurrent symptoms. He called our office today (04/17/19) with complaints of ongoing chest discomfort, for which this appointment was scheduled.   His last ischemic evaluation was a [facility] 06/2016 which revealed severe m-dLAD stenosis with diffuse ISR felt to be high risk for PCI. He then underwent a repeat cath from a femoral approach, however attempts at PCI were unsuccessful and he subsequently underwent CTO intervention 07/11/16 with successful CTO PCI/DES of mLAD. His last echocardiogram 06/2016 showed EF 40-45%, G2DD, akinesis of the apicalanteroseptal and apical myocardium, and mild MR.   He returns today with his mother. He reports continued chest discomfort which occurs at rest, though is reminiscent of angina experienced prior to coronary artery interventions in the past, albeit not as intense. He presented to an OSH ED 1 week ago for chest pain  complaints and was discharged from the ED after a presumed negative work-up. This morning he reports being awoken from sleep with chest pain. No complaints of associated SOB, diaphoresis, dizziness, lightheadedness, N/V, or syncope. No recent complaints of exertional chest pain, DOE, LE edema, orthopnea, or PND.     Past Medical History:  Diagnosis Date  . Alcohol abuse   . CAD (coronary artery disease), native coronary artery    a. 01/2009 s/p Anterior MI and thrombectomy with BMS to mid LAD;  b. 06/2016 Cath/PCI: LM nl, LAD 47m/d, LCX nl, OM1/2/3 nl, RDA 30m. Initial attempt @ PCI failed followed by successful CTO LAD PCI and DES (2.5x38 Synergy)-->rec for indefinte DAPT.  . CKD (chronic kidney disease), stage III   . Essential hypertension   . History of nonadherence to medical treatment   . Hyperlipidemia   . Ischemic cardiomyopathy    a. EF 40% by nuc in 2012;  b. 06/2016 Echo: EF 40-45%, apicalanteroseptal and apical AK, Gr2 DD, mod Ca2+ Ao annulus, mild MR.  10-12-1981 Slurred speech - Chronic   . Type 2 diabetes mellitus (HCC)     Past Surgical History:  Procedure Laterality Date  . CORONARY CTO INTERVENTION N/A 07/11/2016   Procedure: Coronary CTO Intervention;  Surgeon: Peter M 07/13/2016, MD;  Location: [facility] INVASIVE CV LAB;  Service: Cardiovascular;  Laterality: N/A;  . CORONARY STENT INTERVENTION N/A 07/09/2016   Procedure: Coronary Stent Intervention;  Surgeon: 07/11/2016, MD;  Location: [facility] INVASIVE CV LAB;  Service: Cardiovascular;  Laterality: N/A;  .  Knee arthroscopic surgery Right   . LEFT HEART CATH AND CORONARY ANGIOGRAPHY N/A 07/05/2016   Procedure: Left Heart Cath and Coronary Angiography;  Surgeon: Henry W Smith, MD;  Location: [facility] INVASIVE CV LAB;  Service: Cardiovascular;  Laterality: N/A;     Current Outpatient Medications  Medication Sig Dispense Refill  . aspirin 81 MG chewable tablet Chew 81 mg by mouth daily.    . atorvastatin (LIPITOR) 80 MG tablet Take 1 tablet (80  mg total) by mouth daily. 30 tablet 0  . citalopram (CELEXA) 40 MG tablet Take 40 mg by mouth daily.    . clopidogrel (PLAVIX) 75 MG tablet Take 1 tablet (75 mg total) by mouth daily. 30 tablet 0  . empagliflozin (JARDIANCE) 10 MG TABS tablet Take 10 mg by mouth daily.    . glipiZIDE (GLUCOTROL) 10 MG tablet Take 1 tablet (10 mg total) by mouth daily before breakfast. 30 tablet 0  . insulin glargine (LANTUS) 100 UNIT/ML injection Inject into the skin daily.    . isosorbide mononitrate (IMDUR) 60 MG 24 hr tablet Take 1 tablet (60 mg total) by mouth daily. 90 tablet 3  . losartan (COZAAR) 50 MG tablet Take 1 tablet (50 mg total) by mouth daily. 90 tablet 3  . nitroGLYCERIN (NITROSTAT) 0.4 MG SL tablet Place 1 tablet (0.4 mg total) under the tongue every 5 (five) minutes as needed for chest pain. 25 tablet 2  . pantoprazole (PROTONIX) 20 MG tablet Take 1 tablet (20 mg total) by mouth daily. 30 tablet 0  . carvedilol (COREG) 3.125 MG tablet Take 1 tablet (3.125 mg total) by mouth 2 (two) times daily. 180 tablet 3   No current facility-administered medications for this visit.    Allergies:   Penicillins    Social History:  The patient  reports that he has never smoked. He has never used smokeless tobacco. He reports that he does not drink alcohol or use drugs.   Family History:  The patient's family history includes Diabetes Mellitus II in an other family member; Heart disease in his mother.    ROS:  Please see the history of present illness.   Otherwise, review of systems are positive for none.   All other systems are reviewed and negative.    PHYSICAL EXAM: VS:  BP 130/80   Pulse 82   Ht 5' 9" (1.753 m)   Wt 171 lb (77.6 kg)   SpO2 97%   BMI 25.25 kg/m  , BMI Body mass index is 25.25 kg/m. GEN: Well nourished, well developed, in no acute distress HEENT: sclera anicteric Neck: no JVD, carotid bruits, or masses Cardiac: RRR; no murmurs, rubs, or gallops, no edema  Respiratory:   clear to auscultation bilaterally, normal work of breathing GI: soft, nontender, nondistended, + BS MS: no deformity or atrophy Skin: warm and dry, no rash Neuro:  Strength and sensation are intact Psych: euthymic mood, full affect   EKG:  EKG is ordered today. The ekg ordered today demonstrates NSR, rate 82 bpm, inferior Q waves, submm STE in anterior leads, no TWI; EKG appears improved from 04/01/19 in regards to STE and TWI.   Recent Labs: No results found for requested labs within last 8760 hours.    Lipid Panel    Component Value Date/Time   CHOL 146 07/27/2016 1039   TRIG 170 (H) 07/27/2016 1039   HDL 62 07/27/2016 1039   CHOLHDL 2.4 07/27/2016 1039   CHOLHDL 2.1 07/06/2016 0534   VLDL 19   07/06/2016 0534   LDLCALC 50 07/27/2016 1039      Wt Readings from Last 3 Encounters:  04/17/19 171 lb (77.6 kg)  04/01/19 158 lb (71.7 kg)  06/04/18 173 lb (78.5 kg)      Other studies Reviewed: Additional studies/ records that were reviewed today include:   Left heart catheterization 07/05/2016:  Mid RCA lesion, 60 %stenosed.  Mid LAD to Dist LAD lesion, 99 %stenosed.  The left ventricular ejection fraction is 35-45% by visual estimate.    Diffuse in-stent restenosis, 99%, of the stent placed in the mid LAD beyond the second diagonal and first septal perforator. The has features that suggest total occlusion.   50-60% mid RCA stenosis.  Small, widely patent circumflex system.  Distal anterior wall and apical akinesis. LVEF 40%  RECOMMENDATIONS:   Unable to safely attempt PCI on the LAD due to difficulty with guide catheter engagement and stability.  Consider PCI from the femoral approach in a.m. versus revascularization via the CTO team. Will review images. Tentatively scheduled for right femoral PCI tomorrow. I have not yet given dual antiplatelet therapy.  Coronary CTO intervention 07/11/2016    ASSESSMENT AND PLAN:  1. Progressive angina in patient with  CAD s/p BMS to LAD in 2010 with subsequent ISR managed with CTO PCI/DES to LAD in 2018: he has had continued chest discomfort despite starting imdur 60mg  daily 04/01/19. EKG today with improvement in anterior STE and lateral TWI seen previously 04/01/19. Given ongoing discomfort, feel definitive evaluation is necessary. Plan discussed with Dr. Percival Spanish in office today. - Will plan for LHC. The patient understands that risks included but are not limited to stroke (1 in 1000), death (1 in 36), kidney failure [usually temporary] (1 in 500), bleeding (1 in 200), allergic reaction [possibly serious] (1 in 200).  The patient understands and agrees to proceed.  - Will update his echocardiogram - Will restart carvedilol 3.125mg  BID - Continue aspirin and plavix - Continue imdur - Continue atorvastatin  2. Chronic combined CHF/ischemic cardiomyopathy: no volume overload complaints and he appears euvolemic today. EF 40-45% on echo in 2018 - Will update his echocardiogram - Will restart carvedilol 3.125mg  BID - Continue losartan   3. HTN: BP 130/80 today; goal <130/80 - Will restart carvedilol 3.125mg  BID - Continue losartan and imdur  4. HLD: LDL 39 05/2018; at goal of <70 - Continue atorvastatin  5. DM type 2: poorly controlled - A1C 9.6 11/2018; goal <7. Managed by PCP  - Continue insulin, jardiance, and glipizide  6. CKD stage 3: Cr 1.6 11/2018 - Will plan to bring him in early for hydration   Current medicines are reviewed at length with the patient today.  The patient does not have concerns regarding medicines.  The following changes have been made:  As above  Labs/ tests ordered today include:   Orders Placed This Encounter  Procedures  . Basic metabolic panel  . CBC  . EKG 12-Lead  . ECHOCARDIOGRAM COMPLETE     Disposition:   FU with myself or Dr. Percival Spanish in 2 weeks  Signed, Abigail Butts, PA-C  04/17/2019 4:02 PM

## 2019-04-18 LAB — BASIC METABOLIC PANEL
BUN/Creatinine Ratio: 6 — ABNORMAL LOW (ref 9–20)
BUN: 10 mg/dL (ref 6–24)
CO2: 22 mmol/L (ref 20–29)
Calcium: 8.8 mg/dL (ref 8.7–10.2)
Chloride: 109 mmol/L — ABNORMAL HIGH (ref 96–106)
Creatinine, Ser: 1.58 mg/dL — ABNORMAL HIGH (ref 0.76–1.27)
GFR calc Af Amer: 59 mL/min/{1.73_m2} — ABNORMAL LOW (ref >59)
GFR calc non Af Amer: 51 mL/min/{1.73_m2} — ABNORMAL LOW (ref >59)
Glucose: 76 mg/dL (ref 65–99)
Potassium: 4.9 mmol/L (ref 3.5–5.2)
Sodium: 143 mmol/L (ref 134–144)

## 2019-04-18 LAB — CBC
Hematocrit: 41.5 % (ref 37.5–51.0)
Hemoglobin: 14 g/dL (ref 13.0–17.7)
MCH: 27.5 pg (ref 26.6–33.0)
MCHC: 33.7 g/dL (ref 31.5–35.7)
MCV: 81 fL (ref 79–97)
Platelets: 258 10*3/uL (ref 150–450)
RBC: 5.1 x10E6/uL (ref 4.14–5.80)
RDW: 13.8 % (ref 11.6–15.4)
WBC: 7.7 10*3/uL (ref 3.4–10.8)

## 2019-04-20 ENCOUNTER — Inpatient Hospital Stay (HOSPITAL_COMMUNITY): Admission: RE | Admit: 2019-04-20 | Payer: Medicaid Other | Source: Ambulatory Visit

## 2019-04-20 ENCOUNTER — Telehealth: Payer: Self-pay

## 2019-04-20 NOTE — Telephone Encounter (Addendum)
Tried calling patient on the number listed for his home numberand could not get through for the line is busy. Will try calling back.  ----- Message from Beatriz Stallion, PA-C sent at 04/20/2019  7:52 AM EST ----- Please notify the patient that his kidney function appears stable. We will plan to give him fluids through his IV prior to his heart catheterization to hydrate him. Thanks!

## 2019-04-21 ENCOUNTER — Ambulatory Visit: Payer: Medicaid Other | Admitting: Cardiology

## 2019-04-23 ENCOUNTER — Telehealth: Payer: Self-pay | Admitting: *Deleted

## 2019-04-23 ENCOUNTER — Other Ambulatory Visit (HOSPITAL_COMMUNITY): Payer: Medicaid Other

## 2019-04-23 ENCOUNTER — Other Ambulatory Visit: Payer: Self-pay

## 2019-04-23 ENCOUNTER — Other Ambulatory Visit (HOSPITAL_COMMUNITY)
Admission: RE | Admit: 2019-04-23 | Discharge: 2019-04-23 | Disposition: A | Discharge status: Home / Self Care | Payer: Medicaid Other | Source: Ambulatory Visit | Attending: Cardiology | Admitting: Cardiology

## 2019-04-23 DIAGNOSIS — Z20822 Contact with and (suspected) exposure to covid-19: Secondary | ICD-10-CM | POA: Insufficient documentation

## 2019-04-23 DIAGNOSIS — Z01812 Encounter for preprocedural laboratory examination: Secondary | ICD-10-CM | POA: Insufficient documentation

## 2019-04-23 LAB — SARS CORONAVIRUS 2 (TAT 6-24 HRS): SARS Coronavirus 2: NEGATIVE

## 2019-04-23 NOTE — Telephone Encounter (Signed)
It does not look like patient had pre procedure Covid test done 04/20/19. I attempted to contact patient to confirm that he did not have done and see if he could go this morning so procedure would not have to be rescheduled because it was not done.   Phone number listed constantly rings busy and does not connect, that is same phone number for wife on [facility] DPR.  DPR  for [name] only with mother's contact info, I did not attempt to call pt's mother.  Phone note from 04/20/19 indicates pt's phone number ringing constantly busy at that time.  I will forward to Judy Pimple, PA for review and recommendations.

## 2019-04-23 NOTE — Telephone Encounter (Signed)
   Was able to get in touch with the patient by calling his wife [name]. He was instructed to present to [facility] or [facility] for pre-procedural COVID-19 testing prior to 3pm today to ensure no delays in his heart catheterization tomorrow. Patient agreeable to the plan.   Beatriz Stallion, PA-C 04/23/19; 1:48 PM

## 2019-04-23 NOTE — Telephone Encounter (Signed)
Pt contacted pre-catheterization scheduled at  [facility] for: Friday April 24, 2019 10:30 AM Verified arrival time and place: [facility] Main Entrance [facility]) at: 7 AM-per instructions-pre procedure hydration   No solid food after midnight prior to cath, clear liquids until 5 AM day of procedure. Contrast allergy: no  Hold Losartan-AM of procedure Insulin-AM of procedure Jardiance-AM of procedure Glipizide-AM of procedure Insulin-1/2 of usual PM dose prior to procedure if necessary  Except hold medications AM meds can be  taken pre-cath with sip of water including: ASA 81 mg   Confirmed patient has responsible adult to drive home post procedure and observe 24 hours after arriving home: yes  Currently, due to Covid-19 pandemic, only one person will be allowed with patient. Must be the same person for patient's entire stay and will be required to wear a mask. They will be asked to wait in the waiting room for the duration of the patient's stay.  Patients are required to wear a mask when they enter the hospital.      COVID-19 Pre-Screening Questions:  . In the past 7 to 10 days have you had a cough,  shortness of breath, headache, congestion, fever (100 or greater) body aches, chills, sore throat, or sudden loss of taste or sense of smell? Cough, not new in past 7-10 days, denies fever  I reviewed procedure/mask/visitor instructions with patient's wife(DPR) at patient's request, she verbalized understanding, thanked me for call.  Pt's wife states pt has not taken Plavix in more than 1 year, I removed Plavix from pt's medication list.

## 2019-04-24 ENCOUNTER — Ambulatory Visit (HOSPITAL_COMMUNITY)
Admission: RE | Disposition: A | Discharge status: Home / Self Care | Payer: Medicaid Other | Source: Home / Self Care | Attending: Cardiology

## 2019-04-24 ENCOUNTER — Ambulatory Visit (HOSPITAL_COMMUNITY)
Admission: RE | Admit: 2019-04-24 | Discharge: 2019-04-24 | Disposition: A | Discharge status: Home / Self Care | Payer: Medicaid Other | Attending: Cardiology | Admitting: Cardiology

## 2019-04-24 ENCOUNTER — Other Ambulatory Visit: Payer: Self-pay | Admitting: Cardiology

## 2019-04-24 ENCOUNTER — Other Ambulatory Visit: Payer: Self-pay

## 2019-04-24 DIAGNOSIS — Z7982 Long term (current) use of aspirin: Secondary | ICD-10-CM | POA: Diagnosis not present

## 2019-04-24 DIAGNOSIS — I130 Hypertensive heart and chronic kidney disease with heart failure and stage 1 through stage 4 chronic kidney disease, or unspecified chronic kidney disease: Secondary | ICD-10-CM | POA: Diagnosis not present

## 2019-04-24 DIAGNOSIS — Z794 Long term (current) use of insulin: Secondary | ICD-10-CM | POA: Diagnosis not present

## 2019-04-24 DIAGNOSIS — I25110 Atherosclerotic heart disease of native coronary artery with unstable angina pectoris: Secondary | ICD-10-CM | POA: Diagnosis not present

## 2019-04-24 DIAGNOSIS — Z880 Allergy status to penicillin: Secondary | ICD-10-CM | POA: Diagnosis not present

## 2019-04-24 DIAGNOSIS — Z833 Family history of diabetes mellitus: Secondary | ICD-10-CM | POA: Diagnosis not present

## 2019-04-24 DIAGNOSIS — E1122 Type 2 diabetes mellitus with diabetic chronic kidney disease: Secondary | ICD-10-CM | POA: Diagnosis not present

## 2019-04-24 DIAGNOSIS — Z955 Presence of coronary angioplasty implant and graft: Secondary | ICD-10-CM | POA: Insufficient documentation

## 2019-04-24 DIAGNOSIS — I255 Ischemic cardiomyopathy: Secondary | ICD-10-CM | POA: Insufficient documentation

## 2019-04-24 DIAGNOSIS — Z8249 Family history of ischemic heart disease and other diseases of the circulatory system: Secondary | ICD-10-CM | POA: Insufficient documentation

## 2019-04-24 DIAGNOSIS — I252 Old myocardial infarction: Secondary | ICD-10-CM | POA: Insufficient documentation

## 2019-04-24 DIAGNOSIS — N1832 Chronic kidney disease, stage 3b: Secondary | ICD-10-CM

## 2019-04-24 DIAGNOSIS — Z9119 Patient's noncompliance with other medical treatment and regimen: Secondary | ICD-10-CM | POA: Diagnosis not present

## 2019-04-24 DIAGNOSIS — N183 Chronic kidney disease, stage 3 unspecified: Secondary | ICD-10-CM | POA: Diagnosis not present

## 2019-04-24 DIAGNOSIS — I200 Unstable angina: Secondary | ICD-10-CM | POA: Diagnosis present

## 2019-04-24 DIAGNOSIS — E785 Hyperlipidemia, unspecified: Secondary | ICD-10-CM | POA: Diagnosis not present

## 2019-04-24 DIAGNOSIS — Z7902 Long term (current) use of antithrombotics/antiplatelets: Secondary | ICD-10-CM | POA: Diagnosis not present

## 2019-04-24 DIAGNOSIS — I2510 Atherosclerotic heart disease of native coronary artery without angina pectoris: Secondary | ICD-10-CM | POA: Diagnosis present

## 2019-04-24 DIAGNOSIS — Z79899 Other long term (current) drug therapy: Secondary | ICD-10-CM | POA: Insufficient documentation

## 2019-04-24 HISTORY — PX: CORONARY STENT INTERVENTION: CATH118234

## 2019-04-24 HISTORY — PX: LEFT HEART CATH AND CORONARY ANGIOGRAPHY: CATH118249

## 2019-04-24 LAB — GLUCOSE, CAPILLARY: Glucose-Capillary: 91 mg/dL (ref 70–99)

## 2019-04-24 SURGERY — LEFT HEART CATH AND CORONARY ANGIOGRAPHY
Anesthesia: LOCAL

## 2019-04-24 MED ORDER — HEPARIN SODIUM (PORCINE) 1000 UNIT/ML IJ SOLN
INTRAMUSCULAR | Status: AC
  Filled 2019-04-24: qty 1

## 2019-04-24 MED ORDER — ISOSORBIDE MONONITRATE ER 60 MG PO TB24: 60.0000 mg | ORAL_TABLET | Freq: Every day | ORAL | Status: DC

## 2019-04-24 MED ORDER — CLOPIDOGREL BISULFATE 75 MG PO TABS: 75.0000 mg | ORAL_TABLET | Freq: Every day | ORAL | 0 refills | Status: DC

## 2019-04-24 MED ORDER — HYDRALAZINE HCL 20 MG/ML IJ SOLN: 10.0000 mg | INTRAMUSCULAR | Status: AC | PRN

## 2019-04-24 MED ORDER — ACETAMINOPHEN 325 MG PO TABS: 650.0000 mg | ORAL_TABLET | ORAL | Status: DC | PRN

## 2019-04-24 MED ORDER — SODIUM CHLORIDE 0.9% FLUSH: 3.0000 mL | Freq: Two times a day (BID) | INTRAVENOUS | Status: DC

## 2019-04-24 MED ORDER — MIDAZOLAM HCL 2 MG/2ML IJ SOLN
INTRAMUSCULAR | Status: DC | PRN
  Administered 2019-04-24 (×2): 1 mg via INTRAVENOUS

## 2019-04-24 MED ORDER — LABETALOL HCL 5 MG/ML IV SOLN: 10.0000 mg | INTRAVENOUS | Status: AC | PRN

## 2019-04-24 MED ORDER — HEPARIN (PORCINE) IN NACL 1000-0.9 UT/500ML-% IV SOLN
INTRAVENOUS | Status: AC
  Filled 2019-04-24: qty 1000

## 2019-04-24 MED ORDER — SODIUM CHLORIDE 0.9 % WEIGHT BASED INFUSION
1.0000 mL/kg/h | INTRAVENOUS | Status: DC
  Administered 2019-04-24: 250 mL via INTRAVENOUS

## 2019-04-24 MED ORDER — HEPARIN SODIUM (PORCINE) 1000 UNIT/ML IJ SOLN
INTRAMUSCULAR | Status: DC | PRN
  Administered 2019-04-24: 7000 [IU] via INTRAVENOUS

## 2019-04-24 MED ORDER — SODIUM CHLORIDE 0.9 % IV SOLN: 250.0000 mL | INTRAVENOUS | Status: DC | PRN

## 2019-04-24 MED ORDER — FENTANYL CITRATE (PF) 100 MCG/2ML IJ SOLN
INTRAMUSCULAR | Status: AC
  Filled 2019-04-24: qty 2

## 2019-04-24 MED ORDER — ASPIRIN 81 MG PO CHEW
81.0000 mg | CHEWABLE_TABLET | ORAL | Status: AC
  Administered 2019-04-24: 81 mg via ORAL
  Filled 2019-04-24: qty 1

## 2019-04-24 MED ORDER — GLIPIZIDE 10 MG PO TABS: 10.0000 mg | ORAL_TABLET | Freq: Every day | ORAL | Status: DC

## 2019-04-24 MED ORDER — CANAGLIFLOZIN 100 MG PO TABS: 100.0000 mg | ORAL_TABLET | Freq: Every day | ORAL | Status: DC

## 2019-04-24 MED ORDER — NITROGLYCERIN 1 MG/10 ML FOR IR/CATH LAB
INTRA_ARTERIAL | Status: DC | PRN
  Administered 2019-04-24: 200 ug via INTRACORONARY
  Administered 2019-04-24: 100 ug via INTRACORONARY

## 2019-04-24 MED ORDER — CLOPIDOGREL BISULFATE 75 MG PO TABS: 75.0000 mg | ORAL_TABLET | Freq: Every day | ORAL | Status: DC

## 2019-04-24 MED ORDER — NITROGLYCERIN 0.4 MG SL SUBL: 0.4000 mg | SUBLINGUAL_TABLET | SUBLINGUAL | Status: DC | PRN

## 2019-04-24 MED ORDER — FENTANYL CITRATE (PF) 100 MCG/2ML IJ SOLN
INTRAMUSCULAR | Status: DC | PRN
  Administered 2019-04-24 (×2): 25 ug via INTRAVENOUS

## 2019-04-24 MED ORDER — LOSARTAN POTASSIUM 50 MG PO TABS: 50.0000 mg | ORAL_TABLET | Freq: Every day | ORAL | Status: DC

## 2019-04-24 MED ORDER — ONDANSETRON HCL 4 MG/2ML IJ SOLN: 4.0000 mg | Freq: Four times a day (QID) | INTRAMUSCULAR | Status: DC | PRN

## 2019-04-24 MED ORDER — CLOPIDOGREL BISULFATE 75 MG PO TABS
ORAL_TABLET | ORAL | Status: AC
  Filled 2019-04-24: qty 2

## 2019-04-24 MED ORDER — CLOPIDOGREL BISULFATE 300 MG PO TABS
ORAL_TABLET | ORAL | Status: DC | PRN
  Administered 2019-04-24 (×2): 150 mg via ORAL

## 2019-04-24 MED ORDER — MIDAZOLAM HCL 2 MG/2ML IJ SOLN
INTRAMUSCULAR | Status: AC
  Filled 2019-04-24: qty 2

## 2019-04-24 MED ORDER — NITROGLYCERIN 1 MG/10 ML FOR IR/CATH LAB
INTRA_ARTERIAL | Status: AC
  Filled 2019-04-24: qty 10

## 2019-04-24 MED ORDER — ASPIRIN 81 MG PO CHEW: 81.0000 mg | CHEWABLE_TABLET | Freq: Every day | ORAL | Status: DC

## 2019-04-24 MED ORDER — LIDOCAINE HCL (PF) 1 % IJ SOLN
INTRAMUSCULAR | Status: AC
  Filled 2019-04-24: qty 30

## 2019-04-24 MED ORDER — PANTOPRAZOLE SODIUM 20 MG PO TBEC: 20.0000 mg | DELAYED_RELEASE_TABLET | Freq: Every evening | ORAL | Status: DC

## 2019-04-24 MED ORDER — CITALOPRAM HYDROBROMIDE 40 MG PO TABS: 40.0000 mg | ORAL_TABLET | Freq: Every day | ORAL | Status: DC

## 2019-04-24 MED ORDER — SODIUM CHLORIDE 0.9% FLUSH: 3.0000 mL | INTRAVENOUS | Status: DC | PRN

## 2019-04-24 MED ORDER — HEPARIN (PORCINE) IN NACL 1000-0.9 UT/500ML-% IV SOLN
INTRAVENOUS | Status: DC | PRN
  Administered 2019-04-24 (×2): 500 mL

## 2019-04-24 MED ORDER — LIDOCAINE HCL (PF) 1 % IJ SOLN
INTRAMUSCULAR | Status: DC | PRN
  Administered 2019-04-24: 30 mL

## 2019-04-24 MED ORDER — IOHEXOL 350 MG/ML SOLN
INTRAVENOUS | Status: DC | PRN
  Administered 2019-04-24: 100 mL via INTRA_ARTERIAL

## 2019-04-24 MED ORDER — CARVEDILOL 3.125 MG PO TABS: 3.1250 mg | ORAL_TABLET | Freq: Two times a day (BID) | ORAL | Status: DC

## 2019-04-24 MED ORDER — SODIUM CHLORIDE 0.9 % IV SOLN: INTRAVENOUS | Status: AC

## 2019-04-24 MED ORDER — SODIUM CHLORIDE 0.9 % WEIGHT BASED INFUSION
3.0000 mL/kg/h | INTRAVENOUS | Status: AC
  Administered 2019-04-24: 3 mL/kg/h via INTRAVENOUS

## 2019-04-24 MED ORDER — ATORVASTATIN CALCIUM 80 MG PO TABS: 80.0000 mg | ORAL_TABLET | Freq: Every day | ORAL | Status: DC

## 2019-04-24 MED FILL — CLOPIDOGREL 75 MG TABLET: 75 | 30 days supply | Qty: 30 | Fill #0

## 2019-04-24 SURGICAL SUPPLY — 22 items
BALLN SAPPHIRE 2.5X15 (BALLOONS) ×2
BALLOON SAPPHIRE 2.5X15 (BALLOONS) IMPLANT
CATH DXT MULTI JL4 JR4 ANG PIG (CATHETERS) ×1 IMPLANT
CATH VISTA GUIDE 6FR JR4 (CATHETERS) ×1 IMPLANT
CLOSURE MYNX CONTROL 6F/7F (Vascular Products) ×1 IMPLANT
GLIDESHEATH SLEND SS 6F .021 (SHEATH) IMPLANT
GUIDEWIRE INQWIRE 1.5J.035X260 (WIRE) IMPLANT
INQWIRE 1.5J .035X260CM (WIRE)
KIT ENCORE 26 ADVANTAGE (KITS) ×1 IMPLANT
KIT HEART LEFT (KITS) ×2 IMPLANT
PACK CARDIAC CATHETERIZATION (CUSTOM PROCEDURE TRAY) ×2 IMPLANT
SHEATH PINNACLE 5F 10CM (SHEATH) ×1 IMPLANT
SHEATH PINNACLE 6F 10CM (SHEATH) ×1 IMPLANT
SHEATH PROBE COVER 6X72 (BAG) ×1 IMPLANT
STENT SYNERGY XD 3.0X12 (Permanent Stent) IMPLANT
STENT SYNERGY XD 3.0X20 (Permanent Stent) IMPLANT
SYNERGY XD 3.0X12 (Permanent Stent) ×2 IMPLANT
SYNERGY XD 3.0X20 (Permanent Stent) ×2 IMPLANT
TRANSDUCER W/STOPCOCK (MISCELLANEOUS) ×2 IMPLANT
TUBING CIL FLEX 10 FLL-RA (TUBING) ×2 IMPLANT
WIRE ASAHI PROWATER 180CM (WIRE) ×2 IMPLANT
WIRE EMERALD 3MM-J .035X150CM (WIRE) ×1 IMPLANT

## 2019-04-24 NOTE — Discharge Summary (Signed)
Discharge Summary    Patient ID: [name] MRN: [id number]; DOB: [birth year]  Admit date: 04/24/2019 Discharge date: 04/24/2019  Primary Care Provider: Toma Deiters, MD  Primary Cardiologist: Rollene Rotunda, MD   Discharge Diagnoses    Principal Problem:   Unstable angina [facility]) Active Problems:   Chronic kidney disease (CKD), active medical management without dialysis, stage 3 (moderate)   Coronary artery disease involving native coronary artery of native heart with unstable angina pectoris [facility])  Diagnostic Studies/Procedures    LHC 04/24/19:   CULPRIT LESION: Mid RCA lesion is 85% stenosed.  A drug-eluting stent was successfully placed using a SYNERGY XD 3.0X20. Postdilated to 3.35 mm  Post intervention, there is a 0% residual stenosis.  Prox RCA lesion is 70% stenosed-focal dissection from guide catheter.  A drug-eluting stent was successfully placed using a SYNERGY XD 3.0X12. Postdilated to 3.35 mm  Post intervention, there is a 0% residual stenosis.  -------------  Mid LAD to Dist LAD mid having distal stent is 30% in-stent stenosed.  Dist LAD lesion is 80% stenosed - <1.5 mm vessel  -------------  LV end diastolic pressure is low.   SUMMARY  Severe two-vessel disease: Culprit lesion is mid RCA 85%, patent proximal-mid LAD stent with mild to moderate in-stent restenosis and a relatively small caliber (1.5 mm) LAD distally with a focal 80% stenosis (not PCI target) ? Successful DES PCI of mid RCA using Synergy DES (3.0 mm x 20 mm -3.4 mm) with 2nd DES stent placed upstream for catheter related dissection using Synergy DES (3.0 mm x 12 mm-3.4 mm)  Low LVEDP-LV gram not performed to conserve contrast.  RECOMMENDATIONS  With successful Mynx closure device, the patient should be okay for same-day discharge today  Home medication list indicated that he was on clopidogrel, but not listed on MAR, I reordered  Continue aggressive antianginal management of  distal LAD disease  Agree with 2D echocardiogram as scheduled for early February  History of Present Illness     [name] is a 49 y.o. male with a PMH of CAD s/p BMS to mLAD 2010 with subsequent ISR managed with PCI/DES to LAD in 2018, chronic combined CHF, ischemic cardiomyopathy, HTN, HLD, DM type 2, CKD stage 3 and non-compliance who presents for the evaluation of chest pain.  Hospital Course     He was previously seen by cardiology at an outpatient visit with Dr. Antoine Poche 04/01/19 at which time he had complaints of increased chest discomfort and SL nitro use. Dr. Antoine Poche reviewed his cath films and felt that he likely did not have a large area of viable myocardium and any attempts at percutaneous revascularization would be for symptom benefit only and with his renal insufficiency, he would be high risk for intervention. He was started on imdur 60mg  daily for medical management with a low threshold for repeat angiography for recurrent symptoms. He called our office 04/17/19 with complaints of ongoing chest discomfort. He was then seen by 04/19/19, PA on 04/17/19.   His last ischemic evaluation was a [facility] 06/2016 which revealed severe m-dLAD stenosis with diffuse ISR felt to be high risk for PCI. He then underwent a repeat cath from a femoral approach, however attempts at PCI were unsuccessful and he subsequently underwent CTO intervention 07/11/16 with successful CTO PCI/DES of mLAD. His last echocardiogram 06/2016 showed EF 40-45%, G2DD, akinesis of the apicalanteroseptal and apical myocardium, and mild MR.   On last OV, he reported continued chest discomfort which was occurring at  rest, though is reminiscent of angina experienced prior to coronary artery interventions in the past, albeit not as intense. He presented to an OSH ED 1 week ago for chest pain complaints and was discharged from the ED after a presumed negative work-up. On the morning of his OV, he reported being awoken from sleep  with chest pain. Given his recurrent symptoms, plan was for re-cath for further investigation.   LHC performed on 04/24/19 which showed severe two-vessel disease: Culprit lesion is mid RCA 85%, patent proximal-mid LAD stent with mild to moderate in-stent restenosis and a relatively small caliber (1.5 mm) LAD distally with a focal 80% stenosis (not PCI target). Successful DES PCI of mid RCA using Synergy DES (3.0 mm x 20 mm -3.4 mm) with 2nd DES stent placed upstream for catheter related dissection using Synergy DES (3.0 mm x 12 mm-3.4 mm).  LV gram not performed to conserve contrast.  Mynx closure device was used. He was already on Plavix however not listed on his MAR. Will confirm prior to leaving. Plan is for echocardiogram 04/29/19 and close follow up in office on 05/11/19. [place] BMET elevated however at baseline of 1.5. Will have him come for repeat lab work next week.   No new medication changes. Will continue ASA, Plavix, BB, losartan, Imdur.    Consultants: None   The patient was seen and examined by Dr. Ellyn Hack who feels that he is stable and ready for discharge after bedrest is complete.   Did the patient have an acute coronary syndrome (MI, NSTEMI, STEMI, etc) this admission?:  No                               Did the patient have a percutaneous coronary intervention (stent / angioplasty)?:  Yes.     Cath/PCI Registry Performance & Quality Measures: 1. Aspirin prescribed? - Yes 2. ADP Receptor Inhibitor (Plavix/Clopidogrel, Brilinta/Ticagrelor or Effient/Prasugrel) prescribed (includes medically managed patients)? - Yes 3. High Intensity Statin (Lipitor 40-80mg  or Crestor 20-40mg ) prescribed? - Yes 4. For EF <40%, was ACEI/ARB prescribed? - Not Applicable (EF >/= 22%) 5. For EF <40%, Aldosterone Antagonist (Spironolactone or Eplerenone) prescribed? - Not Applicable (EF >/= 02%) 6. Cardiac Rehab Phase II ordered (Included Medically managed Patients)? - Yes   _____________  Discharge  Vitals Blood pressure 119/82, pulse 72, temperature 98 F (36.7 C), temperature source Oral, resp. rate 16, height 5\' 9"  (1.753 m), weight 77.6 kg, SpO2 96 %.  Filed Weights   04/24/19 0724  Weight: 77.6 kg    Labs & Radiologic Studies   CARDIAC CATHETERIZATION  Result Date: 04/24/2019  CULPRIT LESION: Mid RCA lesion is 85% stenosed.  A drug-eluting stent was successfully placed using a SYNERGY XD 3.0X20. Postdilated to 3.35 mm  Post intervention, there is a 0% residual stenosis.  Prox RCA lesion is 70% stenosed-focal dissection from guide catheter.  A drug-eluting stent was successfully placed using a SYNERGY XD 3.0X12. Postdilated to 3.35 mm  Post intervention, there is a 0% residual stenosis.  -------------  Mid LAD to Dist LAD mid having distal stent is 30% in-stent stenosed.  Dist LAD lesion is 80% stenosed - <1.5 mm vessel  -------------  LV end diastolic pressure is low.  SUMMARY  Severe two-vessel disease: Culprit lesion is mid RCA 85%, patent proximal-mid LAD stent with mild to moderate in-stent restenosis and a relatively small caliber (1.5 mm) LAD distally with a focal 80% stenosis (not  PCI target)  Successful DES PCI of mid RCA using Synergy DES (3.0 mm x 20 mm -3.4 mm) with 2nd DES stent placed upstream for catheter related dissection using Synergy DES (3.0 mm x 12 mm-3.4 mm)  Low LVEDP-LV gram not performed to conserve contrast. RECOMMENDATIONS  With successful Mynx closure device, the patient should be okay for same-day discharge today  Home medication list indicated that he was on clopidogrel, but not listed on MAR, I reordered  Continue aggressive antianginal management of distal LAD disease  Agree with 2D echocardiogram as scheduled for early February Follow-up with Dr. Antoine PocheHochrein or Judy PimpleKrista Kroeger, [place] Bryan Lemmaavid Harding, MD  Disposition   Pt is being discharged home today in good condition.  Follow-up Plans & Appointments   Follow-up Information    Beatriz StallionKroeger, Krista  M., PA-C Follow up on 05/11/2019.   Specialty: Physician Assistant Why: Your follow up appointment will be on 05/11/19 at 145pm with Blima RichKrista Kroger, PA. Please arrive at 100pm.  Contact information: [address] [place] [id number] [phone]        [facility] Follow up on 04/29/2019.   Specialty: Cardiology Why: Your echocardiogram will be on 04/29/19 at 205pm.  Contact information: [address] [place] [id number] [phone]         Discharge Instructions    Amb Referral to Cardiac Rehabilitation   Complete by: As directed    Diagnosis: Coronary Stents   After initial evaluation and assessments completed: Virtual Based Care may be provided alone or in conjunction with Phase 2 Cardiac Rehab based on patient barriers.: Yes   Call MD for:  difficulty breathing, headache or visual disturbances   Complete by: As directed    Call MD for:  difficulty breathing, headache or visual disturbances   Complete by: As directed    Call MD for:  extreme fatigue   Complete by: As directed    Call MD for:  extreme fatigue   Complete by: As directed    Call MD for:  hives   Complete by: As directed    Call MD for:  hives   Complete by: As directed    Call MD for:  persistant dizziness or light-headedness   Complete by: As directed    Call MD for:  persistant dizziness or light-headedness   Complete by: As directed    Call MD for:  persistant nausea and vomiting   Complete by: As directed    Call MD for:  persistant nausea and vomiting   Complete by: As directed    Call MD for:  redness, tenderness, or signs of infection (pain, swelling, redness, odor or green/yellow discharge around incision site)   Complete by: As directed    Call MD for:  redness, tenderness, or signs of infection (pain, swelling, redness, odor or green/yellow discharge around incision site)   Complete by: As directed    Call MD for:  severe uncontrolled pain    Complete by: As directed    Call MD for:  severe uncontrolled pain   Complete by: As directed    Call MD for:  temperature >100.4   Complete by: As directed    Call MD for:  temperature >100.4   Complete by: As directed    Diet - low sodium heart healthy   Complete by: As directed    Diet - low sodium heart healthy   Complete by: As directed    Discharge instructions   Complete  by: As directed    No driving for 3-5 days. No lifting over 5 lbs for 1 week. No sexual activity for 1 week. Keep procedure site clean & dry. If you notice increased pain, swelling, bleeding or pus, call/return!  You may shower, but no soaking baths/hot tubs/pools for 1 week.   PLEASE DO NOT MISS ANY DOSES OF YOUR PLAVIX!!!!! Also keep a log of you blood pressures and bring back to your follow up appt. Please call the office with any questions.   Patients taking blood thinners should generally stay away from medicines like ibuprofen, Advil, Motrin, naproxen, and Aleve due to risk of stomach bleeding. You may take Tylenol as directed or talk to your primary doctor about alternatives.  Some studies suggest Prilosec/Omeprazole interacts with Plavix. If you have reflux, please use Protonix for less chance of interaction.   Discharge instructions   Complete by: As directed    You will need to go to [facility] office next early next week to have a lab draw to check your kidney function.   No driving for 3-5 days. No lifting over 5 lbs for 1 week. No sexual activity for 1 week.  Keep procedure site clean & dry. If you notice increased pain, swelling, bleeding or pus, call/return!  You may shower, but no soaking baths/hot tubs/pools for 1 week.   PLEASE DO NOT MISS ANY DOSES OF YOUR PLAVIX!!!!! Also keep a log of you blood pressures and bring back to your follow up appt. Please call the office with any questions.   Patients taking blood thinners should generally stay away from medicines like ibuprofen, Advil, Motrin,  naproxen, and Aleve due to risk of stomach bleeding. You may take Tylenol as directed or talk to your primary doctor about alternatives.  Some studies suggest Prilosec/Omeprazole interacts with Plavix. If you have reflux, please use Protonix for less chance of interaction.   Increase activity slowly   Complete by: As directed    Increase activity slowly   Complete by: As directed      Discharge Medications   Allergies as of 04/24/2019      Reactions   Penicillins Rash   Has patient had a PCN reaction causing immediate rash, facial/tongue/throat swelling, SOB or lightheadedness with hypotension: Yes Has patient had a PCN reaction causing severe rash involving mucus membranes or skin necrosis: Yes Has patient had a PCN reaction that required hospitalization: No Has patient had a PCN reaction occurring within the last 10 years: Yes If all of the above answers are "NO", then may proceed with Cephalosporin use.      Medication List    TAKE these medications   aspirin 81 MG chewable tablet Chew 81 mg by mouth daily.   atorvastatin 80 MG tablet Commonly known as: LIPITOR Take 1 tablet (80 mg total) by mouth daily.   carvedilol 3.125 MG tablet Commonly known as: COREG Take 1 tablet (3.125 mg total) by mouth 2 (two) times daily.   citalopram 40 MG tablet Commonly known as: CELEXA Take 40 mg by mouth daily.   clopidogrel 75 MG tablet Commonly known as: Plavix Take 1 tablet (75 mg total) by mouth daily.   glipiZIDE 10 MG tablet Commonly known as: GLUCOTROL Take 1 tablet (10 mg total) by mouth daily before breakfast.   isosorbide mononitrate 60 MG 24 hr tablet Commonly known as: IMDUR Take 1 tablet (60 mg total) by mouth daily.   Jardiance 10 MG Tabs tablet Generic drug: empagliflozin Take  10 mg by mouth daily.   Lantus 100 UNIT/ML injection Generic drug: insulin glargine Inject into the skin daily.   losartan 50 MG tablet Commonly known as: COZAAR Take 1 tablet (50  mg total) by mouth daily.   nitroGLYCERIN 0.4 MG SL tablet Commonly known as: NITROSTAT Place 1 tablet (0.4 mg total) under the tongue every 5 (five) minutes as needed for chest pain.   pantoprazole 20 MG tablet Commonly known as: PROTONIX Take 1 tablet (20 mg total) by mouth daily.       Outstanding Labs/Studies   BMET next week, order has been placed and directions given to patient.   Duration of Discharge Encounter   Greater than 30 minutes including physician time.  Signed, Georgie Chard, NP 04/24/2019, 3:53 PM

## 2019-04-24 NOTE — Progress Notes (Signed)
Up and walked and tolerated well; right groin stable, no bleeding or hematoma 

## 2019-04-24 NOTE — Progress Notes (Signed)
CARDIAC REHAB PHASE I   Stent education completed with pt. Pt educated on importance of ASA, Plavix, and NTG. Pt given stent card, along with  health healthy and diabetic diets. Reviewed site care, restrictions, and exercise guidelines. Reinforced importance of ASA and Plavix. Encouraged daily weights. Will refer to CRP II [place].   [id number] Reynold Bowen, RN BSN 04/24/2019 3:00 PM

## 2019-04-24 NOTE — Interval H&P Note (Signed)
History and Physical Interval Note:  04/24/2019 12:18 PM  [name]  has presented today for surgery, with the diagnosis of KNOWN CAD WITHUNSTABLE ANGINA.  The various methods of treatment have been discussed with the patient and family. After consideration of risks, benefits and other options for treatment, the patient has consented to  Procedure(s): LEFT HEART CATH AND CORONARY ANGIOGRAPHY (N/A)  PERCUTANEOUS CORONARY INTERVENTION  as a surgical intervention.  The patient's history has been reviewed, patient examined, no change in status, stable for surgery.  I have reviewed the patient's chart and labs.  Questions were answered to the patient's satisfaction.    Cath Lab Visit (complete for each Cath Lab visit)  Clinical Evaluation Leading to the Procedure:   ACS: No.  Non-ACS:    Anginal Classification: CCS III  Anti-ischemic medical therapy: Maximal Therapy (2 or more classes of medications)  Non-Invasive Test Results: No non-invasive testing performed  Prior CABG: No previous CABG   Bryan Lemma

## 2019-04-24 NOTE — Progress Notes (Signed)
Cardiac Rehab notified that patient needed consult.

## 2019-04-24 NOTE — Discharge Instructions (Signed)
Femoral Site Care This sheet gives you information about how to care for yourself after your procedure. Your health care provider may also give you more specific instructions. If you have problems or questions, contact your health care provider. What can I expect after the procedure? After the procedure, it is common to have:  Bruising that usually fades within 1-2 weeks.  Tenderness at the site. Follow these instructions at home: Wound care  Follow instructions from your health care provider about how to take care of your insertion site. Make sure you: ? Wash your hands with soap and water before you change your bandage (dressing). If soap and water are not available, use hand sanitizer. ? Change your dressing as told by your health care provider. ? Leave stitches (sutures), skin glue, or adhesive strips in place. These skin closures may need to stay in place for 2 weeks or longer. If adhesive strip edges start to loosen and curl up, you may trim the loose edges. Do not remove adhesive strips completely unless your health care provider tells you to do that.  Do not take baths, swim, or use a hot tub until your health care provider approves.  You may shower 24-48 hours after the procedure or as told by your health care provider. ? Gently wash the site with plain soap and water. ? Pat the area dry with a clean towel. ? Do not rub the site. This may cause bleeding.  Do not apply powder or lotion to the site. Keep the site clean and dry.  Check your femoral site every day for signs of infection. Check for: ? Redness, swelling, or pain. ? Fluid or blood. ? Warmth. ? Pus or a bad smell. Activity  For the first 2-3 days after your procedure, or as long as directed: ? Avoid climbing stairs as much as possible. ? Do not squat.  Do not lift anything that is heavier than 10 lb (4.5 kg), or the limit that you are told, until your health care provider says that it is safe.  Rest as  directed. ? Avoid sitting for a long time without moving. Get up to take short walks every 1-2 hours.  Do not drive for 24 hours if you were given a medicine to help you relax (sedative). General instructions  Take over-the-counter and prescription medicines only as told by your health care provider.  Keep all follow-up visits as told by your health care provider. This is important. Contact a health care provider if you have:  A fever or chills.  You have redness, swelling, or pain around your insertion site. Get help right away if:  The catheter insertion area swells very fast.  You pass out.  You suddenly start to sweat or your skin gets clammy.  The catheter insertion area is bleeding, and the bleeding does not stop when you hold steady pressure on the area.  The area near or just beyond the catheter insertion site becomes pale, cool, tingly, or numb. These symptoms may represent a serious problem that is an emergency. Do not wait to see if the symptoms will go away. Get medical help right away. Call your local emergency services (911 in the [place] Do not drive yourself to the hospital. Summary  After the procedure, it is common to have bruising that usually fades within 1-2 weeks.  Check your femoral site every day for signs of infection.  Do not lift anything that is heavier than 10 lb (4.5 kg), or the   limit that you are told, until your health care provider says that it is safe. This information is not intended to replace advice given to you by your health care provider. Make sure you discuss any questions you have with your health care provider. Document Revised: 03/25/2017 Document Reviewed: 03/25/2017 Elsevier Patient Education  2020 [employer]    Coronary Angiogram With Stent, Care After This sheet gives you information about how to care for yourself after your procedure. Your health care provider may also give you more specific instructions. If you have problems  or questions, contact your health care provider. What can I expect after the procedure? After the procedure, it is common to have:  Bruising and tenderness at the insertion site. This usually fades within 1-2 weeks.  A collection of blood under the skin (hematoma). This usually decreases within 1-2 weeks. Follow these instructions at home: Medicines  Take over-the-counter and prescription medicines only as told by your health care provider.  If you were prescribed an antibiotic medicine, take it as told by your health care provider. Do not stop using the antibiotic even if you start to feel better.  If you take medicines for diabetes, your health care provider may need to change how much you take. Ask your health care provider for specific directions about taking your diabetes medicines.  If you are taking blood thinners: ? Talk with your health care provider before you take any medicines that contain aspirin or NSAIDs, such as ibuprofen. These medicines increase your risk for dangerous bleeding. ? Take your medicine exactly as told, at the same time every day. ? Avoid activities that could cause injury or bruising, and follow instructions about how to prevent falls. ? Wear a medical alert bracelet or carry a card that lists what medicines you take. Eating and drinking   Follow instructions from your health care provider about eating or drinking restrictions.  Eat a heart-healthy diet that includes plenty of fresh fruits and vegetables.  Avoid foods that are high in salt, sugar, or saturated fat. Avoid fried foods or canned or highly processed food.  Drink enough fluid to keep your urine pale yellow. Alcohol use  Do not drink alcohol if: ? Your health care provider tells you not to. ? You are pregnant, may be pregnant, or plan to become pregnant.  If you drink alcohol: ? Limit how much you use to:  0-1 drink a day for women.  0-2 drinks a day for men. ? Be aware of how much  alcohol is in your drink. In the [place] one drink equals one 12 oz bottle of beer (355 mL), one 5 oz glass of wine (148 mL), or one 1 oz glass of hard liquor (44 mL). Bathing  Do not take baths, swim, or use a hot tub until your health care provider approves. Ask your health care provider if you may take showers. You may only be allowed to take sponge baths.  Gently wash the insertion site with plain soap and water.  Pat the area dry with a clean towel. Do not rub. This may cause bleeding. Incision care  Follow instructions from your health care provider about how to take care of your insertion area. Make sure you: ? Wash your hands with soap and water before and after you change your bandage (dressing). If soap and water are not available, use hand sanitizer. ? Change your dressing as told by your health care provider. ? Leave stitches (sutures) or adhesive strips  in place. These skin closures may need to stay in place for 2 weeks or longer. If adhesive strip edges start to loosen and curl up, you may trim the loose edges. Do not remove adhesive strips completely unless your health care provider tells you to do that.  Do not apply powder or lotion on the insertion area.  Check your insertion area every day for signs of infection. Check for: ? Redness, swelling, or pain. ? Fluid or blood. ? Warmth. ? Pus or a bad smell. Activity  Do not drive for 24 hours if you were given a sedative during your procedure.  Rest as told by your health care provider. ? Avoid sitting for a long time without moving. Get up to take short walks every 1-2 hours. This is important to improve blood flow and breathing. Ask for help if you feel weak or unsteady.  Do not lift anything that is heavier than 10 lb (4.5 kg), or the limit that you are told, until your health care provider says that it is safe.  Return to your normal activities as told by your health care provider. Ask your health care provider what  activities are safe for you. Lifestyle   Do not use any products that contain nicotine or tobacco, such as cigarettes, e-cigarettes, and chewing tobacco. If you need help quitting, ask your health care provider.  If needed, work with your health care provider to treat other problems, such as being overweight, or having high blood pressure or diabetes.  Get regular exercise. Do exercises as told by your health care provider. General instructions  Tell all your health care providers that you have a stent. This is especially important if you are going to get imaging studies, such as MRI.  Wear compression stockings as told by your health care provider. These stockings help to prevent blood clots and reduce swelling in your legs.  Do not strain during a bowel movement if the procedure was done through your leg. Straining may cause bleeding from the insertion site.  Keep all follow-up visits as directed by your health care provider. This is important. Contact a health care provider if you:  Have a fever.  Have chills.  Have redness, swelling, or pain around your insertion area.  Have fluid or blood (other than a little blood on the dressing) coming from your insertion area.  Notice that your insertion area feels warm to the touch.  Have pus or a bad smell coming from your insertion area.  Have more bleeding from the insertion area. Hold pressure on the area. Get help right away if:  You develop chest pain or shortness of breath.  You feel like fainting or you faint.  Your leg becomes cool, numb, or tingly.  You have unusual pain.  Your insertion area is bleeding, and bleeding continues after 30 minutes of steadily held pressure.  You develop bleeding anywhere else, including from your rectum. There may be bright red blood in your urine or stool, or you may have black, tarry stool. These symptoms may represent a serious problem that is an emergency. Do not wait to see if the  symptoms will go away. Get medical help right away. Call your local emergency services (911 in the [place] Do not drive yourself to the hospital. Summary  After this procedure, it is common to have bruising and tenderness around the catheter insertion site. This will go away in a few weeks.  Follow your health care provider's instructions about  caring for your insertion site. Change dressing and clean the area as instructed.  Eat a heart-healthy diet. Limit alcohol use. Do not use tobacco or nicotine.  Contact a health care provider if you have fever or chills, or if you have pus or a bad smell coming from the site.  Get help right away if you develop chest pain, you faint, or have bleeding at the insertion site. This information is not intended to replace advice given to you by your health care provider. Make sure you discuss any questions you have with your health care provider. Document Revised: 10/01/2018 Document Reviewed: 10/01/2018 Elsevier Patient Education  2020 [employer]. Information about your medication: Plavix (anti-platelet agent)  Generic Name (Brand): clopidogrel (Plavix), once daily medication  PURPOSE: You are taking this medication along with aspirin to lower your chance of having a heart attack, stroke, or blood clots in your heart stent. These can be fatal. Brilinta and aspirin help prevent platelets from sticking together and forming a clot that can block an artery or your stent.   Common SIDE EFFECTS you may experience include: bruising or bleeding more easily, shortness of breath  Do not stop taking PLAVIX without talking to the doctor who prescribes it for you. People who are treated with a stent and stop taking Plavix too soon, have a higher risk of getting a blood clot in the stent, having a heart attack, or dying. If you stop Plavix because of bleeding, or for other reasons, your risk of a heart attack or stroke may increase.   Tell all of your doctors and  dentists that you are taking Plavix. They should talk to the doctor who prescribed plavix for you before you have any surgery or invasive procedure.   Contact your health care provider if you experience: severe or uncontrollable bleeding, pink/red/brown urine, vomiting blood or vomit that looks like "coffee grounds", red or black stools (looks like tar), coughing up blood or blood clots ----------------------------------------------------------------------------------------------------------------------

## 2019-04-27 LAB — POCT ACTIVATED CLOTTING TIME: Activated Clotting Time: 296 seconds

## 2019-04-27 MED FILL — Clopidogrel Bisulfate Tab 75 MG (Base Equiv): ORAL | Qty: 4 | Status: AC

## 2019-04-28 DIAGNOSIS — Z0000 Encounter for general adult medical examination without abnormal findings: Secondary | ICD-10-CM | POA: Diagnosis not present

## 2019-04-29 ENCOUNTER — Ambulatory Visit (HOSPITAL_COMMUNITY): Payer: Medicaid Other | Attending: Medical

## 2019-04-29 ENCOUNTER — Other Ambulatory Visit: Payer: Self-pay

## 2019-04-29 DIAGNOSIS — I5040 Unspecified combined systolic (congestive) and diastolic (congestive) heart failure: Secondary | ICD-10-CM | POA: Diagnosis not present

## 2019-04-29 DIAGNOSIS — I255 Ischemic cardiomyopathy: Secondary | ICD-10-CM | POA: Diagnosis not present

## 2019-04-29 MED ORDER — PERFLUTREN LIPID MICROSPHERE
1.0000 mL | INTRAVENOUS | Status: AC | PRN
  Administered 2019-04-29: 2 mL via INTRAVENOUS

## 2019-05-01 ENCOUNTER — Ambulatory Visit (HOSPITAL_COMMUNITY)
Admission: RE | Admit: 2019-05-01 | Discharge: 2019-05-01 | Disposition: A | Discharge status: Home / Self Care | Payer: Medicaid Other | Source: Ambulatory Visit | Attending: Cardiology | Admitting: Cardiology

## 2019-05-01 ENCOUNTER — Other Ambulatory Visit: Payer: Self-pay

## 2019-05-01 ENCOUNTER — Telehealth: Payer: Self-pay | Admitting: Cardiology

## 2019-05-01 ENCOUNTER — Other Ambulatory Visit (HOSPITAL_COMMUNITY): Payer: Self-pay | Admitting: Cardiology

## 2019-05-01 DIAGNOSIS — Z9889 Other specified postprocedural states: Secondary | ICD-10-CM

## 2019-05-01 DIAGNOSIS — M7989 Other specified soft tissue disorders: Secondary | ICD-10-CM | POA: Diagnosis not present

## 2019-05-01 NOTE — Telephone Encounter (Signed)
Son of the patient called. The son reports there is a silver dollar sized  Patch near his groin where the insertion for the heart cath occurred. It had been smaller in the past, but now it is much bigger. The Son states he is very uncomfortable and unable to do anything. The son wanted to know what the patient needs to do to relieve the pain

## 2019-05-01 NOTE — Telephone Encounter (Signed)
Left message for patient to call back. Patient needs to report to [facility] to their ultrasound department today to have cath site assessed. Pre-auth has been completed and [place] approved.

## 2019-05-01 NOTE — Telephone Encounter (Signed)
Spoke with patient, patient aware of ultrasound order. Patient on the way to [facility] for exam.

## 2019-05-01 NOTE — Telephone Encounter (Signed)
Contacted patient wife- she states that patient had cath last week on Friday and they have noticed that he has a knot that has increased in size- it was the size of a pea, now is the size of a silver dollar. He states it is hard, and sore/tender, but it has no redness. He is taking Tylenol- but it does not seem to help. I advised patient I would route to MD to advise of further steps or testing that should be completed.

## 2019-05-01 NOTE — Telephone Encounter (Signed)
Needs to get an ultrasound to look at the cath site today.

## 2019-05-01 NOTE — Telephone Encounter (Signed)
Ordered US for today. Will send scheduling a message to get scheduled.

## 2019-05-04 ENCOUNTER — Other Ambulatory Visit (HOSPITAL_COMMUNITY): Payer: Self-pay | Admitting: Cardiology

## 2019-05-04 ENCOUNTER — Encounter (HOSPITAL_COMMUNITY): Payer: Medicaid Other

## 2019-05-10 NOTE — Progress Notes (Signed)
Cardiology Office Note   Date:  05/11/2019   ID:  Mathieu Schloemer, DOB [birth year], MRN [id number]  PCP:  Toma Deiters, MD  Cardiologist:  Rollene Rotunda, MD EP: None  Chief Complaint  Patient presents with  . Follow-up    s/p LHC 04/24/19      History of Present Illness: [name] is a 49 y.o. male with PMH of CAD s/p BMS to mLAD 2010 with subsequent ISR managed with PCI/DES to LAD in 2018, chronic combined CHF, ischemic cardiomyopathy, HTN, HLD, DM type 2, CKD stage 3, and non-compliance, who presents for the evaluation of post-catheterization follow-up.  He was last evaluated by cardiology at an outpatient visit with myself 04/17/19, to evaluate ongoing chest pain despite addition of imdur 60mg  daily to his regimen. Decision made to pursue a LHC which occurred 04/24/2019 and showed severe 2 vessel CAD with 85% mRCA stenosis managed with PCI/DES with 2nd stent placed upstream for catheter related dissection; he had 80% dLAD disease which was not amenable to stenting, and mild-moderate ISR on previous p-mLAD stent. HE was recommended for aspirin and plavix as well as aggressive risk factor modifications for remaining disease. Echo 04/29/19 showed EF 35-40%, G1DD, and no significant valvular abnormalities. His post-cath course was complicated by a painful lump at the groin cath site, though ultrasound was negative for pseudoaneurysm and hematoma.  He presents today for post-cath follow-up. He reports resolution of chest pain since his heart catheterization. No complaints of SOB, DOE, palpitations, dizziness, lightheadedness, syncope, LE edema, or bleeding. He has been compliant with his DAPT. He reports groin site swelling has improved significantly from last week. We reviewed his echo results and weakened heart muscle. We discussed importance of maintaining a low sodium diet.      Past Medical History:  Diagnosis Date  . Alcohol abuse   . CAD (coronary artery disease), native coronary  artery    a. 01/2009 s/p Anterior MI and thrombectomy with BMS to mid LAD;  b. 06/2016 Cath/PCI: LM nl, LAD 68m/d, LCX nl, OM1/2/3 nl, RDA 30m. Initial attempt @ PCI failed followed by successful CTO LAD PCI and DES (2.5x38 Synergy)-->rec for indefinte DAPT.  . CKD (chronic kidney disease), stage III   . Essential hypertension   . History of nonadherence to medical treatment   . Hyperlipidemia   . Ischemic cardiomyopathy    a. EF 40% by nuc in 2012;  b. 06/2016 Echo: EF 40-45%, apicalanteroseptal and apical AK, Gr2 DD, mod Ca2+ Ao annulus, mild MR.  10-12-1981 Slurred speech - Chronic   . Type 2 diabetes mellitus (HCC)     Past Surgical History:  Procedure Laterality Date  . CORONARY CTO INTERVENTION N/A 07/11/2016   Procedure: Coronary CTO Intervention;  Surgeon: Peter M 07/13/2016, MD;  Location: [facility] INVASIVE CV LAB;  Service: Cardiovascular;  Laterality: N/A;  . CORONARY STENT INTERVENTION N/A 07/09/2016   Procedure: Coronary Stent Intervention;  Surgeon: 07/11/2016, MD;  Location: [facility] INVASIVE CV LAB;  Service: Cardiovascular;  Laterality: N/A;  . CORONARY STENT INTERVENTION N/A 04/24/2019   Procedure: CORONARY STENT INTERVENTION;  Surgeon: 04/26/2019, MD;  Location: [facility] INVASIVE CV LAB;  Service: Cardiovascular;  Laterality: N/A;  . Knee arthroscopic surgery Right   . LEFT HEART CATH AND CORONARY ANGIOGRAPHY N/A 07/05/2016   Procedure: Left Heart Cath and Coronary Angiography;  Surgeon: 09/04/2016, MD;  Location: [facility] INVASIVE CV LAB;  Service: Cardiovascular;  Laterality: N/A;  . LEFT HEART  CATH AND CORONARY ANGIOGRAPHY N/A 04/24/2019   Procedure: LEFT HEART CATH AND CORONARY ANGIOGRAPHY;  Surgeon: Marykay Lex, MD;  Location: [facility] INVASIVE CV LAB;  Service: Cardiovascular;  Laterality: N/A;     Current Outpatient Medications  Medication Sig Dispense Refill  . aspirin 81 MG chewable tablet Chew 81 mg by mouth daily.    [name] atorvastatin (LIPITOR) 80 MG tablet Take 1 tablet (80 mg total) by  mouth daily. 30 tablet 0  . carvedilol (COREG) 3.125 MG tablet Take 1 tablet (3.125 mg total) by mouth 2 (two) times daily. 180 tablet 3  . citalopram (CELEXA) 40 MG tablet Take 40 mg by mouth daily.    . clopidogrel (PLAVIX) 75 MG tablet Take 1 tablet (75 mg total) by mouth daily. 30 tablet 0  . empagliflozin (JARDIANCE) 10 MG TABS tablet Take 10 mg by mouth daily.    [name] glipiZIDE (GLUCOTROL) 10 MG tablet Take 1 tablet (10 mg total) by mouth daily before breakfast. 30 tablet 0  . insulin glargine (LANTUS) 100 UNIT/ML injection Inject into the skin daily.    . isosorbide mononitrate (IMDUR) 60 MG 24 hr tablet Take 1 tablet (60 mg total) by mouth daily. 90 tablet 3  . nitroGLYCERIN (NITROSTAT) 0.4 MG SL tablet Place 1 tablet (0.4 mg total) under the tongue every 5 (five) minutes as needed for chest pain. 25 tablet 2  . pantoprazole (PROTONIX) 20 MG tablet Take 1 tablet (20 mg total) by mouth daily. 30 tablet 0   No current facility-administered medications for this visit.    Allergies:   Penicillins    Social History:  The patient  reports that he has never smoked. He has never used smokeless tobacco. He reports that he does not drink alcohol or use drugs.   Family History:  The patient's family history includes Diabetes Mellitus II in an other family member; Heart disease in his mother.    ROS:  Please see the history of present illness.   Otherwise, review of systems are positive for none.   All other systems are reviewed and negative.    PHYSICAL EXAM: VS:  BP 124/80   Pulse 72   Ht 5\' 9"  (1.753 m)   Wt 170 lb (77.1 kg)   BMI 25.10 kg/m  , BMI Body mass index is 25.1 kg/m. GEN: Well nourished, well developed, in no acute distress HEENT: sclera anicteric Neck: no JVD, carotid bruits, or masses Cardiac: RRR; no murmurs, rubs, or gallops, no edema  Respiratory:  clear to auscultation bilaterally, normal work of breathing GI: soft, nontender, nondistended, + BS MS: no deformity or  atrophy Skin: warm and dry, no rash Neuro:  Strength and sensation are intact Psych: euthymic mood, full affect   EKG:  EKG is not ordered today.   Recent Labs: 04/17/2019: BUN 10; Creatinine, Ser 1.58; Hemoglobin 14.0; Platelets 258; Potassium 4.9; Sodium 143    Lipid Panel    Component Value Date/Time   CHOL 146 07/27/2016 1039   TRIG 170 (H) 07/27/2016 1039   HDL 62 07/27/2016 1039   CHOLHDL 2.4 07/27/2016 1039   CHOLHDL 2.1 07/06/2016 0534   VLDL 19 07/06/2016 0534   LDLCALC 50 07/27/2016 1039      Wt Readings from Last 3 Encounters:  05/11/19 170 lb (77.1 kg)  04/24/19 171 lb (77.6 kg)  04/17/19 171 lb (77.6 kg)      Other studies Reviewed: Additional studies/ records that were reviewed today include:   Echocardiogram 04/29/2019:  1. Left ventricular ejection fraction, by visual estimation, is 35 to  40%. The left ventricle has moderate to severely decreased function. There  is no left ventricular hypertrophy.  2. Mid and apical anterior septum, mid inferolateral segment, apical  lateral segment, and apex are abnormal.  3. Left ventricular diastolic parameters are consistent with Grade I  diastolic dysfunction (impaired relaxation).  4. The left ventricle has no regional wall motion abnormalities.  5. No LV thrombus.  6. Global right ventricle has normal systolic function.The right  ventricular size is normal. No increase in right ventricular wall  thickness.  7. Left atrial size was normal.  8. Right atrial size was normal.  9. The mitral valve is normal in structure. Trivial mitral valve  regurgitation. No evidence of mitral stenosis.  10. The tricuspid valve is normal in structure.  11. The tricuspid valve is normal in structure. Tricuspid valve  regurgitation is trivial.  12. The aortic valve is normal in structure. Aortic valve regurgitation is  not visualized. No evidence of aortic valve sclerosis or stenosis.  13. The pulmonic valve was normal  in structure. Pulmonic valve  regurgitation is not visualized.  14. The inferior vena cava is normal in size with greater than 50%  respiratory variability, suggesting right atrial pressure of 3 mmHg.   Left heart catheterization 04/24/19:  CULPRIT LESION: Mid RCA lesion is 85% stenosed.  A drug-eluting stent was successfully placed using a SYNERGY XD 3.0X20. Postdilated to 3.35 mm  Post intervention, there is a 0% residual stenosis.  Prox RCA lesion is 70% stenosed-focal dissection from guide catheter.  A drug-eluting stent was successfully placed using a SYNERGY XD 3.0X12. Postdilated to 3.35 mm  Post intervention, there is a 0% residual stenosis.  -------------  Mid LAD to Dist LAD mid having distal stent is 30% in-stent stenosed.  Dist LAD lesion is 80% stenosed - <1.5 mm vessel  -------------  LV end diastolic pressure is low.   SUMMARY  Severe two-vessel disease: Culprit lesion is mid RCA 85%, patent proximal-mid LAD stent with mild to moderate in-stent restenosis and a relatively small caliber (1.5 mm) LAD distally with a focal 80% stenosis (not PCI target) ? Successful DES PCI of mid RCA using Synergy DES (3.0 mm x 20 mm -3.4 mm) with 2nd DES stent placed upstream for catheter related dissection using Synergy DES (3.0 mm x 12 mm-3.4 mm)  Low LVEDP-LV gram not performed to conserve contrast.  RECOMMENDATIONS  With successful Mynx closure device, the patient should be okay for same-day discharge today  Home medication list indicated that he was on clopidogrel, but not listed on MAR, I reordered  Continue aggressive antianginal management of distal LAD disease  Agree with 2D echocardiogram as scheduled for early February  RLE arterial doppler 05/01/19: IMPRESSION: No evidence of pseudoaneurysm at the RIGHT groin.   ASSESSMENT AND PLAN:  1. CAD s/p BMS to LAD in 2010 with subsequent ISR managed with CTO PCI/DES to LAD in 2018 and recent PCI/DES to RCA: chest  pain resolved since [facility] 04/24/19 - Continue aspirin and plavix - Continue carvedilol 3.125mg  BID - Continue imdur  - Continue atorvastatin  2. Chronic combined CHF/ischemic cardiomyopathy: echo 04/29/19 with reduction in EF from 40-45% on echo in 2018 to 35-40% now.  - Will transition from losartan to entresto 24-26.  - Will have him follow-up with pharmacy for BP check and titration of entresto in 2 weeks - Will repeat echo in 3 months for monitoring of  his cardiomyopathy - Continue carvedilol  3. HTN: BP 124/80 today; at goal <130/80 - Continue carvedilol and imdur - Will transition from losartan to entresto  4. HLD: LDL 39 05/2018; at goal of <24 - Continue atorvastatin  5. DM type 2: poorly controlled - A1C 9.6 11/2018; goal <7. Managed by PCP  - Continue insulin, jardiance, and glipizide  6. CKD stage 3: Cr 1.58 04/17/19 - Will repeat BMET today for close monitoring   Current medicines are reviewed at length with the patient today.  The patient does not have concerns regarding medicines.  The following changes have been made:  As above  Labs/ tests ordered today include:  No orders of the defined types were placed in this encounter.    Disposition:   FU with pharmacy for med titration in 2 weeks and me or Dr. Antoine Poche in 6 months  Signed, Beatriz Stallion, PA-C  05/11/2019 2:16 PM

## 2019-05-11 ENCOUNTER — Ambulatory Visit: Payer: Medicaid Other | Admitting: Medical

## 2019-05-11 ENCOUNTER — Encounter: Payer: Self-pay | Admitting: Medical

## 2019-05-11 ENCOUNTER — Other Ambulatory Visit: Payer: Self-pay

## 2019-05-11 VITALS — BP 124/80 | HR 72 | Ht 69.0 in | Wt 170.0 lb

## 2019-05-11 DIAGNOSIS — I10 Essential (primary) hypertension: Secondary | ICD-10-CM | POA: Diagnosis not present

## 2019-05-11 DIAGNOSIS — I255 Ischemic cardiomyopathy: Secondary | ICD-10-CM

## 2019-05-11 DIAGNOSIS — I2510 Atherosclerotic heart disease of native coronary artery without angina pectoris: Secondary | ICD-10-CM

## 2019-05-11 DIAGNOSIS — E782 Mixed hyperlipidemia: Secondary | ICD-10-CM | POA: Diagnosis not present

## 2019-05-11 DIAGNOSIS — N1832 Chronic kidney disease, stage 3b: Secondary | ICD-10-CM | POA: Diagnosis not present

## 2019-05-11 DIAGNOSIS — E118 Type 2 diabetes mellitus with unspecified complications: Secondary | ICD-10-CM

## 2019-05-11 DIAGNOSIS — Z794 Long term (current) use of insulin: Secondary | ICD-10-CM | POA: Diagnosis not present

## 2019-05-11 MED ORDER — ENTRESTO 24-26 MG PO TABS: 1.0000 | ORAL_TABLET | Freq: Two times a day (BID) | ORAL | 0 refills | Status: DC

## 2019-05-11 NOTE — Patient Instructions (Addendum)
Medication Instructions:   STOP LOSARTAN  START ENTRESTO 24-26- 1 TABLET 2 TIMES A DAY TOMORROW 05/12/2019.  Samples of ENTRESTO were given to the patient, quantity 28 tablets-1 bottle, Lot Number [id number]   *If you need a refill on your cardiac medications before your next appointment, please call your pharmacy*  Lab Work: You will need to have labs (blood work) drawn today:  BMET If you have labs (blood work) drawn today and your tests are completely normal, you will receive your results only by: [name] MyChart Message (if you have MyChart) OR . A paper copy in the mail If you have any lab test that is abnormal or we need to change your treatment, we will call you to review the results.  Testing/Procedures: Your physician has requested that you have an echocardiogram. Echocardiography is a painless test that uses sound waves to create images of your heart. It provides your doctor with information about the size and shape of your heart and how well your heart's chambers and valves are working. This procedure takes approximately one hour. There are no restrictions for this procedure.   PLEASE SCHEDULE FOR 3 MONTHS   Follow-Up: At [facility], you and your health needs are our priority.  As part of our continuing mission to provide you with exceptional heart care, we have created designated Provider Care Teams.  These Care Teams include your primary Cardiologist (physician) and Advanced Practice Providers (APPs -  Physician Assistants and Nurse Practitioners) who all work together to provide you with the care you need, when you need it.  2 week follow up with the Pharmacy Hypertension   Your next appointment:   6 month(s)  The format for your next appointment:   In Person  Provider:   Rollene Rotunda, MD or Judy Pimple, PA-C  Other Instructions  You will need to weigh yourself daily  Read the information on the Salty 6 and low salt/sodium diet    Low-Sodium Eating  Plan Sodium, which is an element that makes up salt, helps you maintain a healthy balance of fluids in your body. Too much sodium can increase your blood pressure and cause fluid and waste to be held in your body. Your health care provider or dietitian may recommend following this plan if you have high blood pressure (hypertension), kidney disease, liver disease, or heart failure. Eating less sodium can help lower your blood pressure, reduce swelling, and protect your heart, liver, and kidneys. What are tips for following this plan? General guidelines  Most people on this plan should limit their sodium intake to 1,500-2,000 mg (milligrams) of sodium each day. Reading food labels   The Nutrition Facts label lists the amount of sodium in one serving of the food. If you eat more than one serving, you must multiply the listed amount of sodium by the number of servings.  Choose foods with less than 140 mg of sodium per serving.  Avoid foods with 300 mg of sodium or more per serving. Shopping  Look for lower-sodium products, often labeled as "low-sodium" or "no salt added."  Always check the sodium content even if foods are labeled as "unsalted" or "no salt added".  Buy fresh foods. ? Avoid canned foods and premade or frozen meals. ? Avoid canned, cured, or processed meats  Buy breads that have less than 80 mg of sodium per slice. Cooking  Eat more home-cooked food and less restaurant, buffet, and fast food.  Avoid adding salt when cooking. Use salt-free seasonings  or herbs instead of table salt or sea salt. Check with your health care provider or pharmacist before using salt substitutes.  Cook with plant-based oils, such as canola, sunflower, or olive oil. Meal planning  When eating at a restaurant, ask that your food be prepared with less salt or no salt, if possible.  Avoid foods that contain MSG (monosodium glutamate). MSG is sometimes added to [place] food, bouillon, and some  canned foods. What foods are recommended? The items listed may not be a complete list. Talk with your dietitian about what dietary choices are best for you. Grains Low-sodium cereals, including oats, puffed wheat and rice, and shredded wheat. Low-sodium crackers. Unsalted rice. Unsalted pasta. Low-sodium bread. Whole-grain breads and whole-grain pasta. Vegetables Fresh or frozen vegetables. "No salt added" canned vegetables. "No salt added" tomato sauce and paste. Low-sodium or reduced-sodium tomato and vegetable juice. Fruits Fresh, frozen, or canned fruit. Fruit juice. Meats and other protein foods Fresh or frozen (no salt added) meat, poultry, seafood, and fish. Low-sodium canned tuna and salmon. Unsalted nuts. Dried peas, beans, and lentils without added salt. Unsalted canned beans. Eggs. Unsalted nut butters. Dairy Milk. Soy milk. Cheese that is naturally low in sodium, such as ricotta cheese, fresh mozzarella, or Swiss cheese Low-sodium or reduced-sodium cheese. Cream cheese. Yogurt. Fats and oils Unsalted butter. Unsalted margarine with no trans fat. Vegetable oils such as canola or olive oils. Seasonings and other foods Fresh and dried herbs and spices. Salt-free seasonings. Low-sodium mustard and ketchup. Sodium-free salad dressing. Sodium-free light mayonnaise. Fresh or refrigerated horseradish. Lemon juice. Vinegar. Homemade, reduced-sodium, or low-sodium soups. Unsalted popcorn and pretzels. Low-salt or salt-free chips. What foods are not recommended? The items listed may not be a complete list. Talk with your dietitian about what dietary choices are best for you. Grains Instant hot cereals. Bread stuffing, pancake, and biscuit mixes. Croutons. Seasoned rice or pasta mixes. Noodle soup cups. Boxed or frozen macaroni and cheese. Regular salted crackers. Self-rising flour. Vegetables Sauerkraut, pickled vegetables, and relishes. Olives. [place] fries. Onion rings. Regular canned  vegetables (not low-sodium or reduced-sodium). Regular canned tomato sauce and paste (not low-sodium or reduced-sodium). Regular tomato and vegetable juice (not low-sodium or reduced-sodium). Frozen vegetables in sauces. Meats and other protein foods Meat or fish that is salted, canned, smoked, spiced, or pickled. Bacon, ham, sausage, hotdogs, corned beef, chipped beef, packaged lunch meats, salt pork, jerky, pickled herring, anchovies, regular canned tuna, sardines, salted nuts. Dairy Processed cheese and cheese spreads. Cheese curds. Blue cheese. Feta cheese. String cheese. Regular cottage cheese. Buttermilk. Canned milk. Fats and oils Salted butter. Regular margarine. Ghee. Bacon fat. Seasonings and other foods Onion salt, garlic salt, seasoned salt, table salt, and sea salt. Canned and packaged gravies. Worcestershire sauce. Tartar sauce. Barbecue sauce. Teriyaki sauce. Soy sauce, including reduced-sodium. Steak sauce. Fish sauce. Oyster sauce. Cocktail sauce. Horseradish that you find on the shelf. Regular ketchup and mustard. Meat flavorings and tenderizers. Bouillon cubes. Hot sauce and Tabasco sauce. Premade or packaged marinades. Premade or packaged taco seasonings. Relishes. Regular salad dressings. Salsa. Potato and tortilla chips. Corn chips and puffs. Salted popcorn and pretzels. Canned or dried soups. Pizza. Frozen entrees and pot pies. Summary  Eating less sodium can help lower your blood pressure, reduce swelling, and protect your heart, liver, and kidneys.  Most people on this plan should limit their sodium intake to 1,500-2,000 mg (milligrams) of sodium each day.  Canned, boxed, and frozen foods are high in sodium. Restaurant foods, fast  foods, and pizza are also very high in sodium. You also get sodium by adding salt to food.  Try to cook at home, eat more fresh fruits and vegetables, and eat less fast food, canned, processed, or prepared foods. This information is not intended  to replace advice given to you by your health care provider. Make sure you discuss any questions you have with your health care provider. Document Revised: 02/22/2017 Document Reviewed: 03/05/2016 Elsevier Patient Education  2020 [employer].

## 2019-05-12 DIAGNOSIS — L0231 Cutaneous abscess of buttock: Secondary | ICD-10-CM | POA: Diagnosis not present

## 2019-05-12 LAB — BASIC METABOLIC PANEL
BUN/Creatinine Ratio: 10 (ref 9–20)
BUN: 14 mg/dL (ref 6–24)
CO2: 20 mmol/L (ref 20–29)
Calcium: 9.1 mg/dL (ref 8.7–10.2)
Chloride: 107 mmol/L — ABNORMAL HIGH (ref 96–106)
Creatinine, Ser: 1.4 mg/dL — ABNORMAL HIGH (ref 0.76–1.27)
GFR calc Af Amer: 69 mL/min/{1.73_m2} (ref >59)
GFR calc non Af Amer: 59 mL/min/{1.73_m2} — ABNORMAL LOW (ref >59)
Glucose: 94 mg/dL (ref 65–99)
Potassium: 5.5 mmol/L — ABNORMAL HIGH (ref 3.5–5.2)
Sodium: 141 mmol/L (ref 134–144)

## 2019-05-14 ENCOUNTER — Telehealth: Payer: Self-pay

## 2019-05-14 NOTE — Telephone Encounter (Addendum)
Left a voice message for the patient to give me a call to discuss his recent lab results.  ----- Message from Beatriz Stallion, PA-C sent at 05/12/2019  2:17 PM EST ----- Please notify the patient that his kidney function is improved from 3 weeks ago, though his potassium level is elevated. He should limit his dietary potassium over the next few day (melons, sweet potatoes, potatoes, spinach, avocado, bananas, black beans, etc). Please have him come back for a repeat BMET next week for close monitoring. Thank you!

## 2019-05-15 ENCOUNTER — Telehealth: Payer: Self-pay | Admitting: Cardiology

## 2019-05-15 DIAGNOSIS — I10 Essential (primary) hypertension: Secondary | ICD-10-CM

## 2019-05-15 DIAGNOSIS — I2510 Atherosclerotic heart disease of native coronary artery without angina pectoris: Secondary | ICD-10-CM

## 2019-05-15 DIAGNOSIS — I255 Ischemic cardiomyopathy: Secondary | ICD-10-CM

## 2019-05-15 DIAGNOSIS — Z9889 Other specified postprocedural states: Secondary | ICD-10-CM

## 2019-05-15 NOTE — Telephone Encounter (Signed)
Spoke with patient's spouse. Reviewed BMP from 2/15. Patient to come back next week for repeat BMP and will limit dietary potassium in the meantime.

## 2019-05-15 NOTE — Telephone Encounter (Signed)
New Message    Pts wife is calling for results    Please call

## 2019-05-15 NOTE — Telephone Encounter (Signed)
Left a voice message for [name] or the patient [name] to give me a call back to discuss the recent lab results. Will try calling again.

## 2019-05-15 NOTE — Telephone Encounter (Signed)
Per pt's wife call returning this office call for the lab results please.   Give her a call back.

## 2019-05-15 NOTE — Telephone Encounter (Signed)
Patient's wife [name] was informed of recent lab results, to limit potassium in diet and repeat lab work next week by triage nurse Madelin Rear, RN this afternoon.

## 2019-05-18 ENCOUNTER — Other Ambulatory Visit: Payer: Self-pay

## 2019-05-18 ENCOUNTER — Telehealth: Payer: Self-pay | Admitting: Cardiology

## 2019-05-18 ENCOUNTER — Telehealth: Payer: Self-pay

## 2019-05-18 DIAGNOSIS — I2510 Atherosclerotic heart disease of native coronary artery without angina pectoris: Secondary | ICD-10-CM | POA: Diagnosis not present

## 2019-05-18 DIAGNOSIS — Z79899 Other long term (current) drug therapy: Secondary | ICD-10-CM

## 2019-05-18 DIAGNOSIS — N1832 Chronic kidney disease, stage 3b: Secondary | ICD-10-CM

## 2019-05-18 DIAGNOSIS — I10 Essential (primary) hypertension: Secondary | ICD-10-CM | POA: Diagnosis not present

## 2019-05-18 DIAGNOSIS — Z9889 Other specified postprocedural states: Secondary | ICD-10-CM

## 2019-05-18 DIAGNOSIS — I255 Ischemic cardiomyopathy: Secondary | ICD-10-CM

## 2019-05-18 DIAGNOSIS — I25110 Atherosclerotic heart disease of native coronary artery with unstable angina pectoris: Secondary | ICD-10-CM

## 2019-05-18 LAB — BASIC METABOLIC PANEL
BUN/Creatinine Ratio: 11 (ref 9–20)
BUN: 20 mg/dL (ref 6–24)
CO2: 22 mmol/L (ref 20–29)
Calcium: 9.7 mg/dL (ref 8.7–10.2)
Chloride: 103 mmol/L (ref 96–106)
Creatinine, Ser: 1.75 mg/dL — ABNORMAL HIGH (ref 0.76–1.27)
GFR calc Af Amer: 52 mL/min/{1.73_m2} — ABNORMAL LOW (ref >59)
GFR calc non Af Amer: 45 mL/min/{1.73_m2} — ABNORMAL LOW (ref >59)
Glucose: 246 mg/dL — ABNORMAL HIGH (ref 65–99)
Potassium: 6.3 mmol/L (ref 3.5–5.2)
Sodium: 138 mmol/L (ref 134–144)

## 2019-05-18 NOTE — Telephone Encounter (Signed)
Incoming call from Clydie Braun with [facility] reporting a critical lab value: potassium 6.3 Will route to provider that ordered the lab Judy Pimple, PA to make aware.  Spoke with DOD Dr.Acharya, per MD- hold entresto, recheck a BMET tomorrow, and schedule pt for a follow up with one of the APPs if not this week then the first available appt.  Called and spoke with pt regarding lab results and Dr. Lupe Carney recommendations. Pt made aware to hold his entresto and to have repeat lab drawn tomorrow. Follow up appt. Made with Judy Pimple PA on 05/25/19 at 3:15pm. Order for BMET placed. And notified pt that he can have lab work drawn at [facility] in [place] if that is preferred. Pt verbalized understanding and had no further questions at this time.

## 2019-05-18 NOTE — Telephone Encounter (Signed)
Spoke with patient and explained that he should HOLD entresto, have BMET tomorrow and further med changes will be made once labs have resulted

## 2019-05-18 NOTE — Telephone Encounter (Signed)
New message   Patient's wife is calling to see if he still needs to take Sherryll Burger until his office visit on 05/25/2019. Please advise.

## 2019-05-19 ENCOUNTER — Other Ambulatory Visit: Payer: Self-pay | Admitting: Cardiology

## 2019-05-19 DIAGNOSIS — Z79899 Other long term (current) drug therapy: Secondary | ICD-10-CM | POA: Diagnosis not present

## 2019-05-19 DIAGNOSIS — N1832 Chronic kidney disease, stage 3b: Secondary | ICD-10-CM | POA: Diagnosis not present

## 2019-05-19 LAB — BASIC METABOLIC PANEL WITH GFR
BUN/Creatinine Ratio: 12 (calc) (ref 6–22)
BUN: 21 mg/dL (ref 7–25)
CO2: 27 mmol/L (ref 20–32)
Calcium: 9.5 mg/dL (ref 8.6–10.3)
Chloride: 106 mmol/L (ref 98–110)
Creat: 1.79 mg/dL — ABNORMAL HIGH (ref 0.60–1.35)
GFR, Est African American: 51 mL/min/{1.73_m2} — ABNORMAL LOW (ref >=60)
GFR, Est Non African American: 44 mL/min/{1.73_m2} — ABNORMAL LOW (ref >=60)
Glucose, Bld: 123 mg/dL (ref 65–139)
Potassium: 5.6 mmol/L — ABNORMAL HIGH (ref 3.5–5.3)
Sodium: 138 mmol/L (ref 135–146)

## 2019-05-20 ENCOUNTER — Ambulatory Visit: Payer: Medicaid Other | Admitting: Cardiology

## 2019-05-24 NOTE — Progress Notes (Signed)
Cardiology Office Note   Date:  05/25/2019   ID:  [name], DOB November 06, 1970, MRN [id number]  PCP:  Neale Burly, MD  Cardiologist:  [name], MD EP: None  Chief Complaint  Patient presents with  . Follow-up    hyperkalemia      History of Present Illness: [name] is a 49 y.o. male with PMH of CAD s/p BMS to mLAD 2010 with subsequent ISR managed with PCI/DES to LAD in 2018, chronic combined CHF, ischemic cardiomyopathy, HTN, HLD, DM type 2, CKD stage 3, and non-compliance,who presents forthe evaluation of recent hyperkalemia.  He underwent a LHC which occurred 04/24/2019 and showed severe 2 vessel CAD with 85% mRCA stenosis managed with PCI/DES with 2nd stent placed upstream for catheter related dissection; he had 80% dLAD disease which was not amenable to stenting, and mild-moderate ISR on previous p-mLAD stent. He was recommended for aspirin and plavix as well as aggressive risk factor modifications for remaining disease. Echo 04/29/19 showed EF 35-40%, G1DD, and no significant valvular abnormalities.   At his last visit with myself 05/11/19 he was feeling better after recent stent placement. He was started on entresto for management of his worsening combined CHF. Surveillance blood work showed mild hyperkalemia, which then worsened to 6.3 on repeat, improved to 5.6 with cessation of entresto.  He presents today for follow-up of his hyperkalemia. He reports an episode of sharp chest pain when laying down a few nights ago. He states he took a SL nitro which did not improve the pain, however his pain did resolve after 5 minutes. On further review of medications, he reports being off his imdur and pantoprazole for a couple months now. He also notes some numbness to RLE, intermittent claudication, and cold feet. He asks if this could be related to decreased blood flow to his legs. No complaints of SOB, LE edema, dizziness, lightheadedness, syncope, or palpitations.     Past  Medical History:  Diagnosis Date  . Alcohol abuse   . CAD (coronary artery disease), native coronary artery    a. 01/2009 s/p Anterior MI and thrombectomy with BMS to mid LAD;  b. 06/2016 Cath/PCI: LM nl, LAD 27m/d, LCX nl, OM1/2/3 nl, RDA 4m. Initial attempt @ PCI failed followed by successful CTO LAD PCI and DES (2.5x38 Synergy)-->rec for indefinte DAPT.  . CKD (chronic kidney disease), stage III   . Essential hypertension   . History of nonadherence to medical treatment   . Hyperlipidemia   . Ischemic cardiomyopathy    a. EF 40% by nuc in 2012;  b. 06/2016 Echo: EF 40-45%, apicalanteroseptal and apical AK, Gr2 DD, mod Ca2+ Ao annulus, mild MR.  [name] Slurred speech - Chronic   . Type 2 diabetes mellitus ([place])     Past Surgical History:  Procedure Laterality Date  . CORONARY CTO INTERVENTION N/A 07/11/2016   Procedure: Coronary CTO Intervention;  Surgeon: Peter M Martinique, MD;  Location: [facility];  Service: Cardiovascular;  Laterality: N/A;  . CORONARY STENT INTERVENTION N/A 07/09/2016   Procedure: Coronary Stent Intervention;  Surgeon: Lorretta Harp, MD;  Location: [facility];  Service: Cardiovascular;  Laterality: N/A;  . CORONARY STENT INTERVENTION N/A 04/24/2019   Procedure: CORONARY STENT INTERVENTION;  Surgeon: Leonie Man, MD;  Location: [facility];  Service: Cardiovascular;  Laterality: N/A;  . Knee arthroscopic surgery Right   . LEFT HEART CATH AND CORONARY ANGIOGRAPHY N/A 07/05/2016   Procedure: Left  Heart Cath and Coronary Angiography;  Surgeon: Lyn Records, MD;  Location: [facility];  Service: Cardiovascular;  Laterality: N/A;  . LEFT HEART CATH AND CORONARY ANGIOGRAPHY N/A 04/24/2019   Procedure: LEFT HEART CATH AND CORONARY ANGIOGRAPHY;  Surgeon: Marykay Lex, MD;  Location: [facility] INVASIVE CV LAB;  Service: Cardiovascular;  Laterality: N/A;     Current Outpatient Medications  Medication Sig Dispense Refill  . aspirin 81 MG chewable  tablet Chew 81 mg by mouth daily.    [name] atorvastatin (LIPITOR) 80 MG tablet Take 1 tablet (80 mg total) by mouth daily. 30 tablet 0  . citalopram (CELEXA) 40 MG tablet Take 40 mg by mouth daily.    . clopidogrel (PLAVIX) 75 MG tablet Take 1 tablet (75 mg total) by mouth daily. 30 tablet 0  . empagliflozin (JARDIANCE) 10 MG TABS tablet Take 10 mg by mouth daily.    [name] glipiZIDE (GLUCOTROL) 10 MG tablet Take 1 tablet (10 mg total) by mouth daily before breakfast. 30 tablet 0  . insulin glargine (LANTUS) 100 UNIT/ML injection Inject into the skin daily.    . isosorbide mononitrate (IMDUR) 60 MG 24 hr tablet Take 1 tablet (60 mg total) by mouth daily. 90 tablet 3  . nitroGLYCERIN (NITROSTAT) 0.4 MG SL tablet Place 1 tablet (0.4 mg total) under the tongue every 5 (five) minutes as needed for chest pain. 25 tablet 2  . pantoprazole (PROTONIX) 20 MG tablet Take 1 tablet (20 mg total) by mouth daily. 30 tablet 0  . carvedilol (COREG) 3.125 MG tablet Take 1 tablet (3.125 mg total) by mouth 2 (two) times daily. (Patient not taking: Reported on 05/25/2019) 180 tablet 3   No current facility-administered medications for this visit.    Allergies:   Penicillins    Social History:  The patient  reports that he has never smoked. He has never used smokeless tobacco. He reports that he does not drink alcohol or use drugs.   Family History:  The patient's family history includes Diabetes Mellitus II in an other family member; Heart disease in his mother.    ROS:  Please see the history of present illness.   Otherwise, review of systems are positive for none.   All other systems are reviewed and negative.    PHYSICAL EXAM: VS:  BP 132/86   Pulse 70   Temp 97.9 F (36.6 C)   Ht 5\' 9"  (1.753 m)   Wt 171 lb 3.2 oz (77.7 kg)   SpO2 98%   BMI 25.28 kg/m  , BMI Body mass index is 25.28 kg/m. GEN: Well nourished, well developed, in no acute distress HEENT: sclera anicteric Neck: no JVD, carotid  bruits Cardiac: RRR; no murmurs, rubs, or gallops, no edema  Respiratory:  clear to auscultation bilaterally, normal work of breathing GI: soft, nontender, nondistended, + BS MS: no deformity or atrophy Skin: warm and dry, no rash Neuro:  Strength and sensation are intact Psych: euthymic mood, full affect   EKG:  EKG is not ordered today.   Recent Labs: 04/17/2019: Hemoglobin 14.0; Platelets 258 05/19/2019: BUN 21; Creat 1.79; Potassium 5.6; Sodium 138    Lipid Panel    Component Value Date/Time   CHOL 146 07/27/2016 1039   TRIG 170 (H) 07/27/2016 1039   HDL 62 07/27/2016 1039   CHOLHDL 2.4 07/27/2016 1039   CHOLHDL 2.1 07/06/2016 0534   VLDL 19 07/06/2016 0534   LDLCALC 50 07/27/2016 1039  Wt Readings from Last 3 Encounters:  05/25/19 171 lb 3.2 oz (77.7 kg)  05/11/19 170 lb (77.1 kg)  04/24/19 171 lb (77.6 kg)      Other studies Reviewed: Additional studies/ records that were reviewed today include:   Echocardiogram 04/29/2019: 1. Left ventricular ejection fraction, by visual estimation, is 35 to  40%. The left ventricle has moderate to severely decreased function. There  is no left ventricular hypertrophy.  2. Mid and apical anterior septum, mid inferolateral segment, apical  lateral segment, and apex are abnormal.  3. Left ventricular diastolic parameters are consistent with Grade I  diastolic dysfunction (impaired relaxation).  4. The left ventricle has no regional wall motion abnormalities.  5. No LV thrombus.  6. Global right ventricle has normal systolic function.The right  ventricular size is normal. No increase in right ventricular wall  thickness.  7. Left atrial size was normal.  8. Right atrial size was normal.  9. The mitral valve is normal in structure. Trivial mitral valve  regurgitation. No evidence of mitral stenosis.  10. The tricuspid valve is normal in structure.  11. The tricuspid valve is normal in structure. Tricuspid valve   regurgitation is trivial.  12. The aortic valve is normal in structure. Aortic valve regurgitation is  not visualized. No evidence of aortic valve sclerosis or stenosis.  13. The pulmonic valve was normal in structure. Pulmonic valve  regurgitation is not visualized.  14. The inferior vena cava is normal in size with greater than 50%  respiratory variability, suggesting right atrial pressure of 3 mmHg.   Left heart catheterization 04/24/19:  CULPRIT LESION: Mid RCA lesion is 85% stenosed.  A drug-eluting stent was successfully placed using a SYNERGY XD 3.0X20. Postdilated to 3.35 mm  Post intervention, there is a 0% residual stenosis.  Prox RCA lesion is 70% stenosed-focal dissection from guide catheter.  A drug-eluting stent was successfully placed using a SYNERGY XD 3.0X12. Postdilated to 3.35 mm  Post intervention, there is a 0% residual stenosis.  -------------  Mid LAD to Dist LAD mid having distal stent is 30% in-stent stenosed.  Dist LAD lesion is 80% stenosed - <1.5 mm vessel  -------------  LV end diastolic pressure is low.  SUMMARY  Severe two-vessel disease: Culprit lesion is mid RCA 85%, patent proximal-mid LAD stent with mild to moderate in-stent restenosis and a relatively small caliber (1.5 mm) LAD distally with a focal 80% stenosis (not PCI target) ? Successful DES PCI of mid RCA using Synergy DES (3.0 mm x 20 mm -3.4 mm) with 2nd DES stent placed upstream for catheter related dissection using Synergy DES (3.0 mm x 12 mm-3.4 mm)  Low LVEDP-LV gram not performed to conserve contrast.  RECOMMENDATIONS  With successful Mynx closure device, the patient should be okay for same-day discharge today  Home medication list indicated that he was on clopidogrel, but not listed on MAR, I reordered  Continue aggressive antianginal management of distal LAD disease  Agree with 2D echocardiogram as scheduled for early February  RLE arterial doppler  05/01/19: IMPRESSION: No evidence of pseudoaneurysm at the RIGHT groin.   ASSESSMENT AND PLAN:  1. Hyperkalemia: likely 2/2 recently started entresto - Will repeat BMET today for close monitoring - hopeful K is back to normal as he has been off entresto for ~1 week.   2. CAD s/p BMS to LAD in 2010 with subsequent ISR managed with CTO PCI/DES to LAD in 2018 and recent PCI/DES to RCA:one episode of chest  pain lasting 5 minutes occurring at rest since his last visit. Turns out, he has not been taking imdur or pantoprazole for the past couple months (no refills at pharmacy?) - Continue aspirin and plavix -Continuecarvedilol 3.125mg  BID - Will restart imdur 60mg  daily and pantoprazole 20mg  daily for management of recent chest pain which may have been angina vs reflux  - Continue atorvastatin  3.Claudication/LE numbness: predominately RLE complaints. He reports occasional claudication, numbness of RLE from knee down, and cold feet. Certainly possible he has PAD given CAD history, though also with poorly controlled DM - ?neuropathy.  - Will check LE arterial duplex/ABI's  4. Chronic combined CHF/ischemic cardiomyopathy:echo 04/29/19 with reduction in EF from 40-45% on echo in 2018 to 35-40% now. He appears euvolemic on exam - If Cr/K back to baseline, will consider restarting losartan as he seemed to do fine on this medication in the past - Will follow-up repeat echo 08/10/19 for monitoring of his cardiomyopathy - Continue carvedilol  5.HTN:BP 132/86today - Continue carvedilol  - Will restart imdur - If Cr/K back to baseline, will consider restarting losartan as he seemed to do fine on this medication  6. HLD:LDL 39 05/2018; at goal of 08/12/19 - Continue atorvastatin  7. DM type 2:poorly controlled - A1C 9.6 11/2018; goal <7. Managed by PCP  - Continue insulin, jardiance, and glipizide  8. AoCKD stage 3:Cr up to 1.79 05/19/19; baseline 1.4-1.5 - Will repeat BMET today for close  monitoring   Current medicines are reviewed at length with the patient today.  The patient does not have concerns regarding medicines.  The following changes have been made:  As above  Labs/ tests ordered today include:   Orders Placed This Encounter  Procedures  . Basic metabolic panel  . VAS 12/2018 LOWER EXTREMITY ARTERIAL DUPLEX  . VAS 05/21/19 ABI WITH/WO TBI     Disposition:   FU with Dr. [place] in 2 months  Signed, [place], PA-C  05/25/2019 3:47 PM

## 2019-05-25 ENCOUNTER — Encounter: Payer: Self-pay | Admitting: Medical

## 2019-05-25 ENCOUNTER — Other Ambulatory Visit: Payer: Self-pay

## 2019-05-25 ENCOUNTER — Ambulatory Visit: Payer: Medicaid Other | Admitting: Medical

## 2019-05-25 VITALS — BP 132/86 | HR 70 | Temp 97.9°F | Ht 69.0 in | Wt 171.2 lb

## 2019-05-25 DIAGNOSIS — E118 Type 2 diabetes mellitus with unspecified complications: Secondary | ICD-10-CM | POA: Diagnosis not present

## 2019-05-25 DIAGNOSIS — I255 Ischemic cardiomyopathy: Secondary | ICD-10-CM | POA: Diagnosis not present

## 2019-05-25 DIAGNOSIS — N1832 Chronic kidney disease, stage 3b: Secondary | ICD-10-CM

## 2019-05-25 DIAGNOSIS — R209 Unspecified disturbances of skin sensation: Secondary | ICD-10-CM

## 2019-05-25 DIAGNOSIS — R200 Anesthesia of skin: Secondary | ICD-10-CM

## 2019-05-25 DIAGNOSIS — I739 Peripheral vascular disease, unspecified: Secondary | ICD-10-CM

## 2019-05-25 DIAGNOSIS — E875 Hyperkalemia: Secondary | ICD-10-CM

## 2019-05-25 DIAGNOSIS — E782 Mixed hyperlipidemia: Secondary | ICD-10-CM | POA: Diagnosis not present

## 2019-05-25 DIAGNOSIS — Z794 Long term (current) use of insulin: Secondary | ICD-10-CM

## 2019-05-25 DIAGNOSIS — I10 Essential (primary) hypertension: Secondary | ICD-10-CM

## 2019-05-25 DIAGNOSIS — I5042 Chronic combined systolic (congestive) and diastolic (congestive) heart failure: Secondary | ICD-10-CM | POA: Diagnosis not present

## 2019-05-25 DIAGNOSIS — I25110 Atherosclerotic heart disease of native coronary artery with unstable angina pectoris: Secondary | ICD-10-CM | POA: Diagnosis not present

## 2019-05-25 MED ORDER — ISOSORBIDE MONONITRATE ER 60 MG PO TB24: 60.0000 mg | ORAL_TABLET | Freq: Every day | ORAL | 3 refills | Status: DC

## 2019-05-25 MED ORDER — NITROGLYCERIN 0.4 MG SL SUBL: 0.4000 mg | SUBLINGUAL_TABLET | SUBLINGUAL | 2 refills | Status: DC | PRN

## 2019-05-25 MED ORDER — PANTOPRAZOLE SODIUM 20 MG PO TBEC: 20.0000 mg | DELAYED_RELEASE_TABLET | Freq: Every day | ORAL | 0 refills | Status: DC

## 2019-05-25 NOTE — Patient Instructions (Signed)
Medication Instructions:   STOP Entresto  RESTART: Isosorbide 60 mg daily and Pantoprazole 20 mg daily.   *If you need a refill on your cardiac medications before your next appointment, please call your pharmacy*   Lab Work: Your physician recommends that you return for lab work today: BMET  If you have labs (blood work) drawn today and your tests are completely normal, you will receive your results only by: [name] MyChart Message (if you have MyChart) OR . A paper copy in the mail If you have any lab test that is abnormal or we need to change your treatment, we will call you to review the results.   Testing/Procedures: Your physician has requested that you have a lower extremity arterial duplex. During this test, ultrasound is used to evaluate arterial blood flow in the legs. Allow one hour for this exam. There are no restrictions or special instructions.  Your physician has requested that you have an ankle brachial index (ABI). During this test an ultrasound and blood pressure cuff are used to evaluate the arteries that supply the arms and legs with blood. Allow thirty minutes for this exam. There are no restrictions or special instructions.   Follow-Up: At [facility], you and your health needs are our priority.  As part of our continuing mission to provide you with exceptional heart care, we have created designated Provider Care Teams.  These Care Teams include your primary Cardiologist (physician) and Advanced Practice Providers (APPs -  Physician Assistants and Nurse Practitioners) who all work together to provide you with the care you need, when you need it.  We recommend signing up for the patient portal called "MyChart".  Sign up information is provided on this After Visit Summary.  MyChart is used to connect with patients for Virtual Visits (Telemedicine).  Patients are able to view lab/test results, encounter notes, upcoming appointments, etc.  Non-urgent messages can be sent to  your provider as well.   To learn more about what you can do with MyChart, go to [email or web address].    Your next appointment:   2 month(s)  The format for your next appointment:   In Person  Provider:   Rollene Rotunda, MD

## 2019-05-26 ENCOUNTER — Telehealth: Payer: Self-pay | Admitting: *Deleted

## 2019-05-26 LAB — BASIC METABOLIC PANEL
BUN/Creatinine Ratio: 8 — ABNORMAL LOW (ref 9–20)
BUN: 13 mg/dL (ref 6–24)
CO2: 20 mmol/L (ref 20–29)
Calcium: 9.6 mg/dL (ref 8.7–10.2)
Chloride: 104 mmol/L (ref 96–106)
Creatinine, Ser: 1.56 mg/dL — ABNORMAL HIGH (ref 0.76–1.27)
GFR calc Af Amer: 60 mL/min/{1.73_m2} (ref >59)
GFR calc non Af Amer: 52 mL/min/{1.73_m2} — ABNORMAL LOW (ref >59)
Glucose: 102 mg/dL — ABNORMAL HIGH (ref 65–99)
Potassium: 6.3 mmol/L (ref 3.5–5.2)
Sodium: 141 mmol/L (ref 134–144)

## 2019-05-26 NOTE — Telephone Encounter (Signed)
Recommend ER eval, as he has been off entresto for a week and K is rising. Would get ECG, repeat potassium. If still elevated they can treat in ER. Will need close follow up.

## 2019-05-26 NOTE — Telephone Encounter (Signed)
RN called  Spoke to charge nurse an obtained fax number [phone] for [facility] Emergency  Department    patient name ad date of birth given .

## 2019-05-26 NOTE — Telephone Encounter (Signed)
Called to speak with patient . Wife answered the phone she states patient is right there beside her and for me to give her the inforamtion. RN informed Mrs Lauder - the office received patient results of of his potassium from yesterday . Lab value for his potassium is increasing going up. The Doctor of the day  Dr Cristal Deer- reviewed the information  Recommend the patient got to the ER for evaluation an another EKG , labs.  Wife sates they leave in [place] - the nearest ER is [place] and she said this ER is not with [facility]. RN asked if they could  Go th [facility] in [place]. She states not they do not have transportation to [place] and at the present time they are waiting in their daughter to come back from running an errand.  Wife states understands

## 2019-05-26 NOTE — Telephone Encounter (Signed)
[facility] called into report  A critical lab value  - potassium level from yesterday  6.3 .  Report  Will be faxed to office   will show  DOD- DR Cristal Deer ,  Send message to  [place] who order lab

## 2019-06-02 ENCOUNTER — Ambulatory Visit (HOSPITAL_COMMUNITY)
Admission: RE | Admit: 2019-06-02 | Payer: Medicaid Other | Source: Ambulatory Visit | Attending: Medical | Admitting: Medical

## 2019-06-04 ENCOUNTER — Other Ambulatory Visit: Payer: Self-pay

## 2019-06-04 ENCOUNTER — Ambulatory Visit (INDEPENDENT_AMBULATORY_CARE_PROVIDER_SITE_OTHER): Payer: Medicaid Other | Admitting: Pharmacist Clinician (PhC)/ Clinical Pharmacy Specialist

## 2019-06-04 VITALS — BP 150/96 | HR 72 | Resp 16 | Ht 69.0 in | Wt 171.0 lb

## 2019-06-04 DIAGNOSIS — E875 Hyperkalemia: Secondary | ICD-10-CM

## 2019-06-04 DIAGNOSIS — I255 Ischemic cardiomyopathy: Secondary | ICD-10-CM

## 2019-06-04 NOTE — Progress Notes (Signed)
06/06/2019 [name] [birth year] [id number]   HPI:  [name] is a 49 y.o. male patient of Dr Percival Spanish, with a PMH below who presents today for heart failure medication titration.  He was seen in January by Dr. Percival Spanish for increased chest discomfort, at which time he was started on isosorbide mono 60 mg daily.  He was then seen by Roby Lofts PA about 2 weeks later, as the issues was unresolved.  He was scheduled for a left heart cath and his carvedilol was re-started at 3.125 mg bid.  Cath done on January 29 with DES x 2 to mRCA, echo showed EF to be 35-40%.  At his follow up visit, again with Roby Lofts, the losartan was switched to Entresto 24/26 mg.  Just a week later, BMET showed his potassium up from 5.5 to 6.3  Repeat lab the next day was back down to 5.5.  Over the next week it increased back up to 6.3 despite having stopped Entresto.  At the second 6.3 reading, patient was advised by DOD Harrell Gave) to go to ED for EKG and treatment.  Pt voiced understanding, stated they would just need to wait for a family member, as they had no transportation.    Today he returns, originally scheduled for Entresto titration, that was not cancelled after d/c med.  Turns out patient did not go to ED as requested on March 2, and has not has BMET drawn since that 6.3 reading.  He is in the office today with his wife and notes to be feeling well, other than some numbness in one of his legs.    Past Medical History: CAD S/p BMS to mLAD 2010, subsequent ISR treated with DES in 2018  DM2  Doesn't know A1c;   Hypertension BP 150/96 today in the office, on carvedilol 3.125 mg bid  hyperlipidemia 05/2018 TC 101. TG 143, HDL 33, LDL 39 - on atorvastatin 80 mg  CKD Stage 3, latest CrCl 1.56 with CrCl 64.2  Medication non-compliance Chart notes several times that patient has compliance issues.  Wife states he takes all meds, as she doles them out,      Blood Pressure Goal:  130/80  Current  Medications:  Carvedilol 3.125 mg bid  Labs:  05/25/19 Na 141, K 6.3, Glu 102, BUN 13, SCr 1.56  Wt Readings from Last 3 Encounters:  06/04/19 171 lb (77.6 kg)  05/25/19 171 lb 3.2 oz (77.7 kg)  05/11/19 170 lb (77.1 kg)   BP Readings from Last 3 Encounters:  06/04/19 (!) 150/96  05/25/19 132/86  05/11/19 124/80   Pulse Readings from Last 3 Encounters:  06/04/19 72  05/25/19 70  05/11/19 72    Current Outpatient Medications  Medication Sig Dispense Refill  . aspirin 81 MG chewable tablet Chew 81 mg by mouth daily.    [name] atorvastatin (LIPITOR) 80 MG tablet Take 1 tablet (80 mg total) by mouth daily. 30 tablet 0  . carvedilol (COREG) 3.125 MG tablet Take 1 tablet (3.125 mg total) by mouth 2 (two) times daily. 180 tablet 3  . citalopram (CELEXA) 40 MG tablet Take 40 mg by mouth daily.    . clopidogrel (PLAVIX) 75 MG tablet Take 1 tablet (75 mg total) by mouth daily. 30 tablet 0  . empagliflozin (JARDIANCE) 10 MG TABS tablet Take 10 mg by mouth daily.    [name] glipiZIDE (GLUCOTROL) 10 MG tablet Take 1 tablet (10 mg total) by mouth daily before breakfast. 30 tablet  0  . insulin glargine (LANTUS) 100 UNIT/ML injection Inject into the skin daily.    . isosorbide mononitrate (IMDUR) 60 MG 24 hr tablet Take 1 tablet (60 mg total) by mouth daily. 90 tablet 3  . nitroGLYCERIN (NITROSTAT) 0.4 MG SL tablet Place 1 tablet (0.4 mg total) under the tongue every 5 (five) minutes as needed for chest pain. 25 tablet 2  . pantoprazole (PROTONIX) 20 MG tablet Take 1 tablet (20 mg total) by mouth daily. 30 tablet 0   No current facility-administered medications for this visit.    Allergies  Allergen Reactions  . Penicillins Rash    Has patient had a PCN reaction causing immediate rash, facial/tongue/throat swelling, SOB or lightheadedness with hypotension: Yes Has patient had a PCN reaction causing severe rash involving mucus membranes or skin necrosis: Yes Has patient had a PCN reaction that required  hospitalization: No Has patient had a PCN reaction occurring within the last 10 years: Yes If all of the above answers are "NO", then may proceed with Cephalosporin use.     Past Medical History:  Diagnosis Date  . Alcohol abuse   . CAD (coronary artery disease), native coronary artery    a. 01/2009 s/p Anterior MI and thrombectomy with BMS to mid LAD;  b. 06/2016 Cath/PCI: LM nl, LAD 44m/d, LCX nl, OM1/2/3 nl, RDA 62m. Initial attempt @ PCI failed followed by successful CTO LAD PCI and DES (2.5x38 Synergy)-->rec for indefinte DAPT.  . CKD (chronic kidney disease), stage III   . Essential hypertension   . History of nonadherence to medical treatment   . Hyperlipidemia   . Ischemic cardiomyopathy    a. EF 40% by nuc in 2012;  b. 06/2016 Echo: EF 40-45%, apicalanteroseptal and apical AK, Gr2 DD, mod Ca2+ Ao annulus, mild MR.  [name] Slurred speech - Chronic   . Type 2 diabetes mellitus ([facility])     Blood pressure (!) 150/96, pulse 72, resp. rate 16, height 5\' 9"  (1.753 m), weight 171 lb (77.6 kg), SpO2 93 %.  Ischemic cardiomyopathy Patient with EF 35-40% unable to tolerate Entresto due to hyperkalemia.  If he was compliant with losartan prior to that, it also cause some measure of hyperkalemia, as he was at 5.5 around the time of switching medications.  Unable to give him further ACEI/ARB/ARNI at this time.  Will not charge for visit today, as appointment should have been cancelled.  Will have him go to lab and stressed dangers of having potassium level too high.  Patient feeling well overall today.  He may need something for his BP in the future, but until we get labs back, I am hesitant to prescribe anything.    Because patient lives far from hospital access, I am giving him 3 packets of Lokelma today.  Should his potassium still be elevated, we can have him use as appropriate amount and get him back to a lab in [facility] for repeat measures.    [facility] PharmD CPP [facility] [address] [place], [place] [phone]

## 2019-06-05 LAB — BASIC METABOLIC PANEL
BUN/Creatinine Ratio: 7 — ABNORMAL LOW (ref 9–20)
BUN: 11 mg/dL (ref 6–24)
CO2: 23 mmol/L (ref 20–29)
Calcium: 8.6 mg/dL — ABNORMAL LOW (ref 8.7–10.2)
Chloride: 105 mmol/L (ref 96–106)
Creatinine, Ser: 1.66 mg/dL — ABNORMAL HIGH (ref 0.76–1.27)
GFR calc Af Amer: 56 mL/min/{1.73_m2} — ABNORMAL LOW (ref >59)
GFR calc non Af Amer: 48 mL/min/{1.73_m2} — ABNORMAL LOW (ref >59)
Glucose: 63 mg/dL — ABNORMAL LOW (ref 65–99)
Potassium: 5.2 mmol/L (ref 3.5–5.2)
Sodium: 141 mmol/L (ref 134–144)

## 2019-06-06 ENCOUNTER — Encounter: Payer: Self-pay | Admitting: Pharmacist Clinician (PhC)/ Clinical Pharmacy Specialist

## 2019-06-06 NOTE — Patient Instructions (Signed)
Go to lab today for repeat potassium level.    If it is still elevated, we will have you use one of the packets of Lokelma.  DO NOT USE THESE UNLESS WE SPECIFICALLY ASK YOU TO.

## 2019-06-06 NOTE — Assessment & Plan Note (Addendum)
Patient with EF 35-40% unable to tolerate Entresto due to hyperkalemia.  If he was compliant with losartan prior to that, it also cause some measure of hyperkalemia, as he was at 5.5 around the time of switching medications.  Unable to give him further ACEI/ARB/ARNI at this time.  Will not charge for visit today, as appointment should have been cancelled.  Will have him go to lab and stressed dangers of having potassium level too high.  Patient feeling well overall today.  He may need something for his BP in the future, but until we get labs back, I am hesitant to prescribe anything.    Because patient lives far from hospital access, I am giving him 3 packets of Lokelma today.  Should his potassium still be elevated, we can have him use as appropriate amount and get him back to a lab in [facility] for repeat measures.

## 2019-06-08 ENCOUNTER — Telehealth: Payer: Self-pay | Admitting: Cardiology

## 2019-06-08 NOTE — Telephone Encounter (Signed)
Wife updated with lab results and MD's recommendation. Wife verbalized understanding.

## 2019-06-08 NOTE — Telephone Encounter (Signed)
   Pt's wife calling would like to know pt's lab results  Please call

## 2019-06-26 ENCOUNTER — Other Ambulatory Visit: Payer: Self-pay | Admitting: Cardiology

## 2019-06-26 ENCOUNTER — Other Ambulatory Visit: Payer: Self-pay | Admitting: Medical

## 2019-06-26 NOTE — Telephone Encounter (Signed)
This is Dr. Hochrein's pt. °

## 2019-07-01 ENCOUNTER — Other Ambulatory Visit: Payer: Self-pay

## 2019-07-01 ENCOUNTER — Emergency Department (HOSPITAL_COMMUNITY): Payer: Medicaid Other

## 2019-07-01 ENCOUNTER — Inpatient Hospital Stay (HOSPITAL_COMMUNITY)
Admission: EM | Admit: 2019-07-01 | Discharge: 2019-07-03 | DRG: 101 | Disposition: A | Discharge status: Home / Self Care | Payer: Medicaid Other | Attending: Family Medicine | Admitting: Family Medicine

## 2019-07-01 ENCOUNTER — Encounter (HOSPITAL_COMMUNITY): Payer: Self-pay

## 2019-07-01 DIAGNOSIS — R159 Full incontinence of feces: Secondary | ICD-10-CM | POA: Diagnosis present

## 2019-07-01 DIAGNOSIS — E1165 Type 2 diabetes mellitus with hyperglycemia: Secondary | ICD-10-CM | POA: Diagnosis present

## 2019-07-01 DIAGNOSIS — E1169 Type 2 diabetes mellitus with other specified complication: Secondary | ICD-10-CM

## 2019-07-01 DIAGNOSIS — R569 Unspecified convulsions: Secondary | ICD-10-CM | POA: Diagnosis not present

## 2019-07-01 DIAGNOSIS — E860 Dehydration: Secondary | ICD-10-CM | POA: Diagnosis present

## 2019-07-01 DIAGNOSIS — I10 Essential (primary) hypertension: Secondary | ICD-10-CM | POA: Diagnosis present

## 2019-07-01 DIAGNOSIS — E785 Hyperlipidemia, unspecified: Secondary | ICD-10-CM | POA: Diagnosis present

## 2019-07-01 DIAGNOSIS — E875 Hyperkalemia: Secondary | ICD-10-CM

## 2019-07-01 DIAGNOSIS — I25110 Atherosclerotic heart disease of native coronary artery with unstable angina pectoris: Secondary | ICD-10-CM | POA: Diagnosis present

## 2019-07-01 DIAGNOSIS — Z880 Allergy status to penicillin: Secondary | ICD-10-CM

## 2019-07-01 DIAGNOSIS — N1832 Chronic kidney disease, stage 3b: Secondary | ICD-10-CM | POA: Diagnosis present

## 2019-07-01 DIAGNOSIS — Z833 Family history of diabetes mellitus: Secondary | ICD-10-CM

## 2019-07-01 DIAGNOSIS — I5022 Chronic systolic (congestive) heart failure: Secondary | ICD-10-CM | POA: Diagnosis present

## 2019-07-01 DIAGNOSIS — I252 Old myocardial infarction: Secondary | ICD-10-CM

## 2019-07-01 DIAGNOSIS — G40909 Epilepsy, unspecified, not intractable, without status epilepticus: Secondary | ICD-10-CM | POA: Diagnosis not present

## 2019-07-01 DIAGNOSIS — N179 Acute kidney failure, unspecified: Secondary | ICD-10-CM | POA: Diagnosis present

## 2019-07-01 DIAGNOSIS — IMO0002 Reserved for concepts with insufficient information to code with codable children: Secondary | ICD-10-CM | POA: Diagnosis present

## 2019-07-01 DIAGNOSIS — R4020 Unspecified coma: Secondary | ICD-10-CM | POA: Diagnosis not present

## 2019-07-01 DIAGNOSIS — R0689 Other abnormalities of breathing: Secondary | ICD-10-CM | POA: Diagnosis not present

## 2019-07-01 DIAGNOSIS — E118 Type 2 diabetes mellitus with unspecified complications: Secondary | ICD-10-CM

## 2019-07-01 DIAGNOSIS — Z955 Presence of coronary angioplasty implant and graft: Secondary | ICD-10-CM

## 2019-07-01 DIAGNOSIS — I152 Hypertension secondary to endocrine disorders: Secondary | ICD-10-CM | POA: Diagnosis present

## 2019-07-01 DIAGNOSIS — Z03818 Encounter for observation for suspected exposure to other biological agents ruled out: Secondary | ICD-10-CM | POA: Diagnosis not present

## 2019-07-01 DIAGNOSIS — Z20822 Contact with and (suspected) exposure to covid-19: Secondary | ICD-10-CM | POA: Diagnosis present

## 2019-07-01 DIAGNOSIS — Z7982 Long term (current) use of aspirin: Secondary | ICD-10-CM

## 2019-07-01 DIAGNOSIS — Z794 Long term (current) use of insulin: Secondary | ICD-10-CM

## 2019-07-01 DIAGNOSIS — Z8249 Family history of ischemic heart disease and other diseases of the circulatory system: Secondary | ICD-10-CM

## 2019-07-01 DIAGNOSIS — Z7902 Long term (current) use of antithrombotics/antiplatelets: Secondary | ICD-10-CM

## 2019-07-01 DIAGNOSIS — R32 Unspecified urinary incontinence: Secondary | ICD-10-CM | POA: Diagnosis present

## 2019-07-01 DIAGNOSIS — I2510 Atherosclerotic heart disease of native coronary artery without angina pectoris: Secondary | ICD-10-CM | POA: Diagnosis present

## 2019-07-01 DIAGNOSIS — I255 Ischemic cardiomyopathy: Secondary | ICD-10-CM | POA: Diagnosis present

## 2019-07-01 DIAGNOSIS — Z79899 Other long term (current) drug therapy: Secondary | ICD-10-CM

## 2019-07-01 DIAGNOSIS — E1159 Type 2 diabetes mellitus with other circulatory complications: Secondary | ICD-10-CM | POA: Diagnosis present

## 2019-07-01 DIAGNOSIS — E1122 Type 2 diabetes mellitus with diabetic chronic kidney disease: Secondary | ICD-10-CM | POA: Diagnosis present

## 2019-07-01 DIAGNOSIS — I959 Hypotension, unspecified: Secondary | ICD-10-CM | POA: Diagnosis not present

## 2019-07-01 DIAGNOSIS — F329 Major depressive disorder, single episode, unspecified: Secondary | ICD-10-CM | POA: Diagnosis present

## 2019-07-01 DIAGNOSIS — R55 Syncope and collapse: Secondary | ICD-10-CM | POA: Diagnosis not present

## 2019-07-01 DIAGNOSIS — R0902 Hypoxemia: Secondary | ICD-10-CM | POA: Diagnosis not present

## 2019-07-01 DIAGNOSIS — I130 Hypertensive heart and chronic kidney disease with heart failure and stage 1 through stage 4 chronic kidney disease, or unspecified chronic kidney disease: Secondary | ICD-10-CM | POA: Diagnosis present

## 2019-07-01 LAB — CBC
HCT: 43.6 % (ref 39.0–52.0)
Hemoglobin: 14.4 g/dL (ref 13.0–17.0)
MCH: 27.9 pg (ref 26.0–34.0)
MCHC: 33 g/dL (ref 30.0–36.0)
MCV: 84.3 fL (ref 80.0–100.0)
Platelets: 223 10*3/uL (ref 150–400)
RBC: 5.17 MIL/uL (ref 4.22–5.81)
RDW: 12.8 % (ref 11.5–15.5)
WBC: 9.8 10*3/uL (ref 4.0–10.5)
nRBC: 0 % (ref 0.0–0.2)

## 2019-07-01 LAB — CBG MONITORING, ED: Glucose-Capillary: 136 mg/dL — ABNORMAL HIGH (ref 70–99)

## 2019-07-01 LAB — RAPID URINE DRUG SCREEN, HOSP PERFORMED
Amphetamines: NOT DETECTED
Barbiturates: NOT DETECTED
Benzodiazepines: NOT DETECTED
Cocaine: NOT DETECTED
Opiates: NOT DETECTED
Tetrahydrocannabinol: NOT DETECTED

## 2019-07-01 LAB — GLUCOSE, CAPILLARY: Glucose-Capillary: 225 mg/dL — ABNORMAL HIGH (ref 70–99)

## 2019-07-01 LAB — BASIC METABOLIC PANEL
Anion gap: 8 (ref 5–15)
BUN: 18 mg/dL (ref 6–20)
CO2: 22 mmol/L (ref 22–32)
Calcium: 8.5 mg/dL — ABNORMAL LOW (ref 8.9–10.3)
Chloride: 104 mmol/L (ref 98–111)
Creatinine, Ser: 1.93 mg/dL — ABNORMAL HIGH (ref 0.61–1.24)
GFR calc Af Amer: 47 mL/min — ABNORMAL LOW (ref >60)
GFR calc non Af Amer: 40 mL/min — ABNORMAL LOW (ref >60)
Glucose, Bld: 147 mg/dL — ABNORMAL HIGH (ref 70–99)
Potassium: 5.7 mmol/L — ABNORMAL HIGH (ref 3.5–5.1)
Sodium: 134 mmol/L — ABNORMAL LOW (ref 135–145)

## 2019-07-01 LAB — ETHANOL: Alcohol, Ethyl (B): <10 mg/dL (ref <10)

## 2019-07-01 LAB — TSH: TSH: 1.972 u[IU]/mL (ref 0.350–4.500)

## 2019-07-01 LAB — PHOSPHORUS: Phosphorus: 2.5 mg/dL (ref 2.5–4.6)

## 2019-07-01 LAB — MAGNESIUM: Magnesium: 1.5 mg/dL — ABNORMAL LOW (ref 1.7–2.4)

## 2019-07-01 MED ORDER — OMEGA-3-ACID ETHYL ESTERS 1 G PO CAPS
1.0000 g | ORAL_CAPSULE | Freq: Every day | ORAL | Status: DC
  Administered 2019-07-02 – 2019-07-03 (×2): 1 g via ORAL
  Filled 2019-07-01 (×2): qty 1

## 2019-07-01 MED ORDER — POLYETHYLENE GLYCOL 3350 17 G PO PACK: 17.0000 g | PACK | Freq: Every day | ORAL | Status: DC | PRN

## 2019-07-01 MED ORDER — ONDANSETRON HCL 4 MG/2ML IJ SOLN: 4.0000 mg | Freq: Four times a day (QID) | INTRAMUSCULAR | Status: DC | PRN

## 2019-07-01 MED ORDER — GLIPIZIDE 5 MG PO TABS
10.0000 mg | ORAL_TABLET | Freq: Every day | ORAL | Status: DC
  Administered 2019-07-02 – 2019-07-03 (×2): 10 mg via ORAL
  Filled 2019-07-01 (×2): qty 2

## 2019-07-01 MED ORDER — INSULIN ASPART 100 UNIT/ML ~~LOC~~ SOLN
0.0000 [IU] | Freq: Three times a day (TID) | SUBCUTANEOUS | Status: DC
  Administered 2019-07-02 (×2): 2 [IU] via SUBCUTANEOUS
  Administered 2019-07-02: 3 [IU] via SUBCUTANEOUS
  Administered 2019-07-03: 5 [IU] via SUBCUTANEOUS
  Administered 2019-07-03: 2 [IU] via SUBCUTANEOUS

## 2019-07-01 MED ORDER — CARVEDILOL 3.125 MG PO TABS
3.1250 mg | ORAL_TABLET | Freq: Two times a day (BID) | ORAL | Status: DC
  Administered 2019-07-01 – 2019-07-03 (×4): 3.125 mg via ORAL
  Filled 2019-07-01 (×4): qty 1

## 2019-07-01 MED ORDER — ACETAMINOPHEN 325 MG PO TABS: 650.0000 mg | ORAL_TABLET | Freq: Four times a day (QID) | ORAL | Status: DC | PRN

## 2019-07-01 MED ORDER — INSULIN ASPART 100 UNIT/ML ~~LOC~~ SOLN
0.0000 [IU] | Freq: Every day | SUBCUTANEOUS | Status: DC
  Administered 2019-07-01 – 2019-07-02 (×2): 2 [IU] via SUBCUTANEOUS

## 2019-07-01 MED ORDER — LEVETIRACETAM IN NACL 500 MG/100ML IV SOLN
500.0000 mg | Freq: Two times a day (BID) | INTRAVENOUS | Status: DC
  Administered 2019-07-02 – 2019-07-03 (×3): 500 mg via INTRAVENOUS
  Filled 2019-07-01 (×3): qty 100

## 2019-07-01 MED ORDER — ACETAMINOPHEN 650 MG RE SUPP: 650.0000 mg | Freq: Four times a day (QID) | RECTAL | Status: DC | PRN

## 2019-07-01 MED ORDER — ONDANSETRON HCL 4 MG PO TABS: 4.0000 mg | ORAL_TABLET | Freq: Four times a day (QID) | ORAL | Status: DC | PRN

## 2019-07-01 MED ORDER — LORAZEPAM 2 MG/ML IJ SOLN: 1.0000 mg | INTRAMUSCULAR | Status: DC | PRN

## 2019-07-01 MED ORDER — LEVETIRACETAM IN NACL 1000 MG/100ML IV SOLN
1000.0000 mg | Freq: Once | INTRAVENOUS | Status: AC
  Administered 2019-07-01: 1000 mg via INTRAVENOUS
  Filled 2019-07-01: qty 100

## 2019-07-01 MED ORDER — PANTOPRAZOLE SODIUM 20 MG PO TBEC
20.0000 mg | DELAYED_RELEASE_TABLET | Freq: Every day | ORAL | Status: DC
  Filled 2019-07-01 (×2): qty 1

## 2019-07-01 MED ORDER — MAGNESIUM SULFATE 2 GM/50ML IV SOLN
2.0000 g | Freq: Once | INTRAVENOUS | Status: AC
  Administered 2019-07-01: 2 g via INTRAVENOUS
  Filled 2019-07-01: qty 50

## 2019-07-01 MED ORDER — CLOPIDOGREL BISULFATE 75 MG PO TABS
75.0000 mg | ORAL_TABLET | Freq: Every day | ORAL | Status: DC
  Administered 2019-07-02 – 2019-07-03 (×2): 75 mg via ORAL
  Filled 2019-07-01 (×2): qty 1

## 2019-07-01 MED ORDER — ASPIRIN 81 MG PO CHEW
81.0000 mg | CHEWABLE_TABLET | Freq: Every day | ORAL | Status: DC
  Administered 2019-07-02 – 2019-07-03 (×2): 81 mg via ORAL
  Filled 2019-07-01 (×2): qty 1

## 2019-07-01 MED ORDER — GABAPENTIN 300 MG PO CAPS
300.0000 mg | ORAL_CAPSULE | Freq: Three times a day (TID) | ORAL | Status: DC
  Administered 2019-07-01 – 2019-07-03 (×5): 300 mg via ORAL
  Filled 2019-07-01 (×5): qty 1

## 2019-07-01 MED ORDER — SODIUM ZIRCONIUM CYCLOSILICATE 5 G PO PACK
5.0000 g | PACK | ORAL | Status: AC
  Administered 2019-07-01: 5 g via ORAL
  Filled 2019-07-01: qty 1

## 2019-07-01 MED ORDER — SODIUM CHLORIDE 0.9 % IV SOLN: INTRAVENOUS | Status: AC

## 2019-07-01 MED ORDER — ENOXAPARIN SODIUM 40 MG/0.4ML ~~LOC~~ SOLN
40.0000 mg | SUBCUTANEOUS | Status: DC
  Administered 2019-07-01 – 2019-07-02 (×2): 40 mg via SUBCUTANEOUS
  Filled 2019-07-01 (×2): qty 0.4

## 2019-07-01 MED ORDER — GADOBUTROL 1 MMOL/ML IV SOLN
7.0000 mL | Freq: Once | INTRAVENOUS | Status: AC | PRN
  Administered 2019-07-01: 7 mL via INTRAVENOUS

## 2019-07-01 MED ORDER — NITROGLYCERIN 0.4 MG SL SUBL: 0.4000 mg | SUBLINGUAL_TABLET | SUBLINGUAL | Status: DC | PRN

## 2019-07-01 MED ORDER — ATORVASTATIN CALCIUM 40 MG PO TABS
80.0000 mg | ORAL_TABLET | Freq: Every day | ORAL | Status: DC
  Administered 2019-07-02 – 2019-07-03 (×2): 80 mg via ORAL
  Filled 2019-07-01 (×2): qty 2

## 2019-07-01 MED ORDER — CITALOPRAM HYDROBROMIDE 20 MG PO TABS
40.0000 mg | ORAL_TABLET | Freq: Every day | ORAL | Status: DC
  Administered 2019-07-02 – 2019-07-03 (×2): 40 mg via ORAL
  Filled 2019-07-01 (×2): qty 2

## 2019-07-01 MED ORDER — ISOSORBIDE MONONITRATE ER 60 MG PO TB24
60.0000 mg | ORAL_TABLET | Freq: Every day | ORAL | Status: DC
  Administered 2019-07-02 – 2019-07-03 (×2): 60 mg via ORAL
  Filled 2019-07-01 (×2): qty 1
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
## 2019-07-01 NOTE — ED Notes (Signed)
Called Tele-Neuro beeped to [phone]. 

## 2019-07-01 NOTE — ED Notes (Signed)
Pt returned from MRI °
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
## 2019-07-01 NOTE — ED Notes (Signed)
Pt transported to CT Scan
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
## 2019-07-01 NOTE — Consult Note (Signed)
[facility] TeleNeurology Consult Services  Stat Consult  Date of Service:   07/01/2019 13:44:05  Impression:     .  R56.9 - Seizures  Comments/Sign-Out: Patient is a 49 yo male with a PMH of CAD, CKD, HTN, HLD, ischemic cardiomyopathy, alcohol abuse, h/o seizure for which he did not seek medical attention, DM II, baseline speech impediment who p/w a witnessed seizure, preceded by epigastric aura-n/v. No new deficits on exam. CTH nAF. Na 134. CKD noted on labs. No clear provoking factors. Unlikely EtoH withdrawal. Given second event, with possible partial focus given epigastric aura suggestive of temporal lobe seizures, additional work-up is recommended. Will begin AED in this setting as well.  CT HEAD: Showed No Acute Hemorrhage or Acute Core Infarct Reviewed  Metrics: [facility] Notification Time: 07/01/2019 13:42:00 Stamp Time: 07/01/2019 13:44:05 Callback Response Time: 07/01/2019 13:50:00  Our recommendations are outlined below.  Recommendations:     .  Load With Keppra 1000 mg Now     .  Start Keppra 500 mg BID     .  Rec EEG  Imaging Studies:     .  MRI Head with and Without Contrast, if possible with CKD. If unable to given contrast then w/o.   Therapies:     .  Physical Therapy, Occupational Therapy, Speech Therapy Assessment When Applicable  Other WorkUp:     .  Infectious/metabolic workup per primary team  Disposition: Neurology Follow Up Recommended  Sign Out:     .  Discussed with Emergency Department Provider  ----------------------------------------------------------------------------------------------------  Chief Complaint: seizure  History of Present Illness: Patient is a 49 year old Male.  Patient is a 49 yo male with a PMH of CAD, CKD, HTN, HLD, ischemic cardiomyopathy, alcohol abuse, h/o seizure for which he did not seek medical attention, DM II, baseline speech impediment who p/w a witnessed seizure, preceded by  epigastric aura-n/v. History obtained from ED MD, who spoke with patients son. [name] reports that about 9:30 this morning he began to notice n/v and stomach upset. He was in the car with his son about 11:30AM, he felt his n/v increasing. He denies any other prodromal symptoms. No headache, diplopia, weakness, numbness. He then had a witnessed GTC lasting for several minutes, with head deviation and frothing from the mouth. Incontinent of urine. He returned to baseline shortly after. At baseline, now in the ED. His son also reported that he had a witnessed GTC shortly after being discharged from having PCI for CAD. Similar event. At that time patient refused to seek medical attention. Patient and his family endorse that he is not at baseline. Patient reports he drinks 2 beers per day, last beer between 1-4pm yesterday. No fevers or rashes noted.       Examination: BP(Reviewed), Pulse(Reviewed), Blood Glucose(Reviewed)  Neuro Exam:  General: Alert,Awake, Oriented to Time, Place, Person  Speech: Fluent:  Language: Intact:  Face: Symmetric:  Facial Sensation: Intact:  Visual Fields: Intact:  Extraocular Movements: Intact:  Motor Exam: No Drift:  Sensation: Intact:  Coordination: Intact:     Patient/Family was informed the Neurology Consult would occur via TeleHealth consult by way of interactive audio and video telecommunications and consented to receiving care in this manner.  Due to the immediate potential for life-threatening deterioration due to underlying acute neurologic illness, I spent 21 minutes providing critical care. This time includes time for face to face visit via telemedicine, review of medical records, imaging studies and discussion of findings with providers, the  patient and/or family.   Dr Jennefer Bravo   [facility] [phone]  Case [id number]

## 2019-07-01 NOTE — ED Notes (Signed)
Pts son [name] arrived at Pt bedside. MD Provider Notified and speaking with family member now.

## 2019-07-01 NOTE — ED Notes (Signed)
ED Provider at bedside. 

## 2019-07-02 ENCOUNTER — Observation Stay (HOSPITAL_COMMUNITY)
Admit: 2019-07-02 | Discharge: 2019-07-02 | Disposition: A | Discharge status: Home / Self Care | Payer: Medicaid Other | Attending: Internal Medicine | Admitting: Internal Medicine

## 2019-07-02 DIAGNOSIS — Z03818 Encounter for observation for suspected exposure to other biological agents ruled out: Secondary | ICD-10-CM | POA: Diagnosis not present

## 2019-07-02 DIAGNOSIS — R159 Full incontinence of feces: Secondary | ICD-10-CM | POA: Diagnosis present

## 2019-07-02 DIAGNOSIS — N179 Acute kidney failure, unspecified: Secondary | ICD-10-CM | POA: Diagnosis present

## 2019-07-02 DIAGNOSIS — I255 Ischemic cardiomyopathy: Secondary | ICD-10-CM | POA: Diagnosis present

## 2019-07-02 DIAGNOSIS — G40909 Epilepsy, unspecified, not intractable, without status epilepticus: Secondary | ICD-10-CM | POA: Diagnosis not present

## 2019-07-02 DIAGNOSIS — Z833 Family history of diabetes mellitus: Secondary | ICD-10-CM | POA: Diagnosis not present

## 2019-07-02 DIAGNOSIS — Z20822 Contact with and (suspected) exposure to covid-19: Secondary | ICD-10-CM | POA: Diagnosis present

## 2019-07-02 DIAGNOSIS — E785 Hyperlipidemia, unspecified: Secondary | ICD-10-CM | POA: Diagnosis present

## 2019-07-02 DIAGNOSIS — I130 Hypertensive heart and chronic kidney disease with heart failure and stage 1 through stage 4 chronic kidney disease, or unspecified chronic kidney disease: Secondary | ICD-10-CM | POA: Diagnosis present

## 2019-07-02 DIAGNOSIS — R569 Unspecified convulsions: Secondary | ICD-10-CM | POA: Diagnosis not present

## 2019-07-02 DIAGNOSIS — I5022 Chronic systolic (congestive) heart failure: Secondary | ICD-10-CM | POA: Diagnosis present

## 2019-07-02 DIAGNOSIS — Z7982 Long term (current) use of aspirin: Secondary | ICD-10-CM | POA: Diagnosis not present

## 2019-07-02 DIAGNOSIS — I10 Essential (primary) hypertension: Secondary | ICD-10-CM | POA: Diagnosis not present

## 2019-07-02 DIAGNOSIS — E1169 Type 2 diabetes mellitus with other specified complication: Secondary | ICD-10-CM | POA: Diagnosis not present

## 2019-07-02 DIAGNOSIS — Z7902 Long term (current) use of antithrombotics/antiplatelets: Secondary | ICD-10-CM | POA: Diagnosis not present

## 2019-07-02 DIAGNOSIS — F329 Major depressive disorder, single episode, unspecified: Secondary | ICD-10-CM | POA: Diagnosis present

## 2019-07-02 DIAGNOSIS — E1165 Type 2 diabetes mellitus with hyperglycemia: Secondary | ICD-10-CM | POA: Diagnosis present

## 2019-07-02 DIAGNOSIS — R32 Unspecified urinary incontinence: Secondary | ICD-10-CM | POA: Diagnosis present

## 2019-07-02 DIAGNOSIS — Z8249 Family history of ischemic heart disease and other diseases of the circulatory system: Secondary | ICD-10-CM | POA: Diagnosis not present

## 2019-07-02 DIAGNOSIS — Z794 Long term (current) use of insulin: Secondary | ICD-10-CM | POA: Diagnosis not present

## 2019-07-02 DIAGNOSIS — Z79899 Other long term (current) drug therapy: Secondary | ICD-10-CM | POA: Diagnosis not present

## 2019-07-02 DIAGNOSIS — E875 Hyperkalemia: Secondary | ICD-10-CM | POA: Diagnosis not present

## 2019-07-02 DIAGNOSIS — N1832 Chronic kidney disease, stage 3b: Secondary | ICD-10-CM | POA: Diagnosis present

## 2019-07-02 DIAGNOSIS — E1122 Type 2 diabetes mellitus with diabetic chronic kidney disease: Secondary | ICD-10-CM | POA: Diagnosis present

## 2019-07-02 DIAGNOSIS — Z880 Allergy status to penicillin: Secondary | ICD-10-CM | POA: Diagnosis not present

## 2019-07-02 DIAGNOSIS — E860 Dehydration: Secondary | ICD-10-CM | POA: Diagnosis present

## 2019-07-02 DIAGNOSIS — Z955 Presence of coronary angioplasty implant and graft: Secondary | ICD-10-CM | POA: Diagnosis not present

## 2019-07-02 DIAGNOSIS — I252 Old myocardial infarction: Secondary | ICD-10-CM | POA: Diagnosis not present

## 2019-07-02 DIAGNOSIS — R4020 Unspecified coma: Secondary | ICD-10-CM | POA: Diagnosis not present

## 2019-07-02 LAB — GLUCOSE, CAPILLARY
Glucose-Capillary: 157 mg/dL — ABNORMAL HIGH (ref 70–99)
Glucose-Capillary: 179 mg/dL — ABNORMAL HIGH (ref 70–99)
Glucose-Capillary: 247 mg/dL — ABNORMAL HIGH (ref 70–99)
Glucose-Capillary: 213 mg/dL — ABNORMAL HIGH (ref 70–99)

## 2019-07-02 LAB — PROLACTIN: Prolactin: 8.2 ng/mL (ref 4.0–15.2)

## 2019-07-02 LAB — SARS CORONAVIRUS 2 (TAT 6-24 HRS): SARS Coronavirus 2: NEGATIVE

## 2019-07-02 LAB — HIV ANTIBODY (ROUTINE TESTING W REFLEX): HIV Screen 4th Generation wRfx: NONREACTIVE

## 2019-07-02 MED ORDER — PANTOPRAZOLE SODIUM 40 MG PO TBEC
40.0000 mg | DELAYED_RELEASE_TABLET | Freq: Every day | ORAL | Status: DC
  Administered 2019-07-03: 40 mg via ORAL
  Filled 2019-07-02 (×2): qty 1

## 2019-07-02 NOTE — Progress Notes (Signed)
EEG completed, results pending. 

## 2019-07-02 NOTE — Progress Notes (Signed)
Patient Demographics:    [name], is a 49 y.o. male, DOB - 1970/10/23, SWF:[id number]  Admit date - 07/01/2019   Admitting Physician Ejiroghene Arlyce Dice, MD  Outpatient Primary MD for the patient is Neale Burly, MD  LOS - 0   Chief Complaint  Patient presents with  . Loss of Consciousness        Subjective:    [name] today has no fevers, no emesis,  No chest pain,   --Patient's wife and son at bedside, questions answered -Patient complains of headache, wife and son believes that patient is not quite back to baseline  Assessment  & Plan :    Principal Problem:   Loss of consciousness ([name]) Active Problems:   Type 2 diabetes mellitus, uncontrolled ([place])   Essential hypertension   Major depression   Coronary artery disease involving native coronary artery of native heart with unstable angina pectoris [facility])  Brief Summary:- 49 y.o. male with medical history significant for diabetes mellitus, hypertension, chronic renal failure, coronary artery disease, chronic stuttering speech admitted on 07/01/2019 with unresponsive episode and jerking movements with concerns about possible seizures  A/p 1) possible seizures--- telemetry neurologist recommends Keppra will continue this along with as needed lorazepam --EEG pending -Patient wife and son at bedside they state that patient is not quite back to baseline -CT head and brain MRI without acute findings -Prolactin level is 8.2, not elevated - 2)H/o CAD--- LAD stent in 2018, RCA stent in January 2021 -No ACS type symptoms at this time -Continue Plavix, aspirin, isosorbide,, Coreg, Lipitor  3)HFrEF--echo from 04/29/2019 with EF of 35%--- continue Coreg, losartan on hold due to hyperkalemia and renal concerns  4)DM2-no recent A1c, continue to hold glipizide, Use Novolog/Humalog Sliding scale insulin with Accu-Cheks/Fingersticks as ordered     5)AKI----acute kidney injury on CKD stage - III --worsening renal function most likely due to losartan use and possible dehydration - creatinine on admission=1.93  , baseline creatinine = 1.6 to 1.7   ,   renally adjust medications, avoid nephrotoxic agents / dehydration  / hypotension -Hold losartan especially given hyperkalemia  6) patient with recurrent episode of hyperkalemia--- losartan has been discontinued  Disposition/Need for in-Hospital Stay- patient unable to be discharged at this time due to -Patient wife and son at bedside they state that patient is not quite back to baseline---awaiting EEG report, continue Keppra that was just started -Patient From: home D/C Place: home Barriers: Not Clinically Stable- ----Patient wife and son at bedside they state that patient is not quite back to baseline--- anticipate discharge home on 07/03/2019 with family  Code Status : full  Family Communication:   Wife and son at bedside  Consults  : Telemetry neurology  DVT Prophylaxis  :  Lovenox -  - SCDs   Lab Results  Component Value Date   PLT 223 07/01/2019    Inpatient Medications  Scheduled Meds: . aspirin  81 mg Oral Daily  . atorvastatin  80 mg Oral Daily  . carvedilol  3.125 mg Oral BID  . citalopram  40 mg Oral Daily  . clopidogrel  75 mg Oral Daily  . enoxaparin (LOVENOX) injection  40 mg Subcutaneous Q24H  . gabapentin  300 mg Oral TID  .  glipiZIDE  10 mg Oral QAC breakfast  . insulin aspart  0-5 Units Subcutaneous QHS  . insulin aspart  0-9 Units Subcutaneous TID WC  . isosorbide mononitrate  60 mg Oral Daily  . omega-3 acid ethyl esters  1 g Oral Daily  . pantoprazole  40 mg Oral Daily   Continuous Infusions: . levETIRAcetam 500 mg (07/02/19 0506)   PRN Meds:.acetaminophen **OR** acetaminophen, LORazepam, nitroGLYCERIN, ondansetron **OR** ondansetron (ZOFRAN) IV, polyethylene glycol    Anti-infectives (From admission, onward)   None        Objective:    Vitals:   07/01/19 1848 07/01/19 2150 07/02/19 0402 07/02/19 1300  BP: 118/79 106/73 108/69 105/71  Pulse: 70 71 61 64  Resp: 18 20 10 18   Temp: 98 F (36.7 C) 98.2 F (36.8 C) 97.8 F (36.6 C) 98.1 F (36.7 C)  TempSrc: Oral Oral Oral Oral  SpO2: 97% 96% 97% 97%  Weight:      Height:        Wt Readings from Last 3 Encounters:  07/01/19 77.6 kg  06/04/19 77.6 kg  05/25/19 77.7 kg     Intake/Output Summary (Last 24 hours) at 07/02/2019 1755 Last data filed at 07/02/2019 1300 Gross per 24 hour  Intake 1003.24 ml  Output 200 ml  Net 803.24 ml   Physical Exam Gen:- Awake Alert,  Resting  HEENT:- [place].AT, No sclera icterus Neck-Supple Neck,No JVD,.  Lungs-  CTAB , fair symmetrical air movement CV- S1, S2 normal, regular  Abd-  +ve B.Sounds, Abd Soft, No tenderness,    Extremity/Skin:- No  edema, pedal pulses present  Psych-affect is appropriate, oriented x3 Neuro-chronic speech concerns (stutters) - Generalized weakness---no new focal deficits, no tremors   Data Review:   Micro Results Recent Results (from the past 240 hour(s))  SARS CORONAVIRUS 2 (TAT 6-24 HRS) Nasopharyngeal Nasopharyngeal Swab     Status: None   Collection Time: 07/01/19  5:24 PM   Specimen: Nasopharyngeal Swab  Result Value Ref Range Status   SARS Coronavirus 2 NEGATIVE NEGATIVE Final    Comment: (NOTE) SARS-CoV-2 target nucleic acids are NOT DETECTED. The SARS-CoV-2 RNA is generally detectable in upper and lower respiratory specimens during the acute phase of infection. Negative results do not preclude SARS-CoV-2 infection, do not rule out co-infections with other pathogens, and should not be used as the sole basis for treatment or other patient management decisions. Negative results must be combined with clinical observations, patient history, and epidemiological information. The expected result is Negative. Fact Sheet for Patients: 08/31/19 Fact Sheet for  Healthcare Providers: HairSlick.no This test is not yet approved or cleared by the quierodirigir.com FDA and  has been authorized for detection and/or diagnosis of SARS-CoV-2 by FDA under an Emergency Use Authorization (EUA). This EUA will remain  in effect (meaning this test can be used) for the duration of the COVID-19 declaration under Section 56 4(b)(1) of the Act, 21 U.S.C. section 360bbb-3(b)(1), unless the authorization is terminated or revoked sooner. Performed at [facility], [address], [place], [place]     Radiology Reports CT Head Wo Contrast  Result Date: 07/01/2019 CLINICAL DATA:  Seizure EXAM: CT HEAD WITHOUT CONTRAST TECHNIQUE: Contiguous axial images were obtained from the base of the skull through the vertex without intravenous contrast. COMPARISON:  CT head 08/19/2016 FINDINGS: Brain: No evidence of acute infarction, hemorrhage, hydrocephalus, extra-axial collection or mass lesion/mass effect. Vascular: Negative for hyperdense vessel Skull: Negative Sinuses/Orbits: Mild mucosal edema  paranasal sinuses. No orbital lesion. Other: None IMPRESSION: Negative CT head. Electronically Signed   By: Marlan Palau M.D.   On: 07/01/2019 14:27   MR Brain W and Wo Contrast  Result Date: 07/01/2019 CLINICAL DATA:  49 year old male status post seizure. EXAM: MRI HEAD WITHOUT AND WITH CONTRAST TECHNIQUE: Multiplanar, multiecho pulse sequences of the brain and surrounding structures were obtained without and with intravenous contrast. CONTRAST:  35mL GADAVIST GADOBUTROL 1 MMOL/ML IV SOLN COMPARISON:  Head CT earlier today. Report of [facility] CTA head and neck 02/16/2017 (no images available). FINDINGS: Brain: No restricted diffusion to suggest acute infarction. No midline shift, mass effect, evidence of mass lesion, ventriculomegaly, extra-axial collection or acute intracranial hemorrhage. Cervicomedullary junction and pituitary are  within normal limits. Thin slice coronal images demonstrate symmetric hippocampal formations, within normal limits. No mesial temporal lobe signal abnormality identified. There is minimal nonspecific cerebral white matter T2 and FLAIR hyperintensity. No cortical encephalomalacia or definite chronic cerebral blood products. Deep gray nuclei, brainstem and cerebellum appear normal. No abnormal enhancement identified. No dural thickening. Vascular: Major intracranial vascular flow voids are preserved. The major dural venous sinuses are enhancing and appear to be patent. Skull and upper cervical spine: Negative visible cervical spine and bone marrow signal. Sinuses/Orbits: Postoperative changes to both globes. Otherwise negative orbits. Mild to moderate scattered paranasal sinus mucosal thickening. Other: Mastoids are clear. Visible internal auditory structures appear normal. Trace retained secretions in the nasopharynx. IMPRESSION: 1. No acute intracranial abnormality. Normal brain aside from minimal nonspecific white matter signal changes. 2. Mild to moderate paranasal sinus mucosal thickening. Electronically Signed   By: Odessa Fleming M.D.   On: 07/01/2019 17:20     CBC Recent Labs  Lab 07/01/19 1252  WBC 9.8  HGB 14.4  HCT 43.6  PLT 223  MCV 84.3  MCH 27.9  MCHC 33.0  RDW 12.8    Chemistries  Recent Labs  Lab 07/01/19 1252 07/01/19 1542  NA 134*  --   K 5.7*  --   CL 104  --   CO2 22  --   GLUCOSE 147*  --   BUN 18  --   CREATININE 1.93*  --   CALCIUM 8.5*  --   MG  --  1.5*   ------------------------------------------------------------------------------------------------------------------ No results for input(s): CHOL, HDL, LDLCALC, TRIG, CHOLHDL, LDLDIRECT in the last 72 hours.  Lab Results  Component Value Date   HGBA1C 7.3 (H) 08/19/2016   ------------------------------------------------------------------------------------------------------------------ Recent Labs     07/01/19 1542  TSH 1.972   ------------------------------------------------------------------------------------------------------------------ No results for input(s): VITAMINB12, FOLATE, FERRITIN, TIBC, IRON, RETICCTPCT in the last 72 hours.  Coagulation profile No results for input(s): INR, PROTIME in the last 168 hours.  No results for input(s): DDIMER in the last 72 hours.  Cardiac Enzymes No results for input(s): CKMB, TROPONINI, MYOGLOBIN in the last 168 hours.  Invalid input(s): CK ------------------------------------------------------------------------------------------------------------------ No results found for: BNP   Shon Hale M.D on 07/02/2019 at 5:55 PM  Go to [email or web address] - for contact info  [facility] - Office  [phone]
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
## 2019-07-03 DIAGNOSIS — R4020 Unspecified coma: Secondary | ICD-10-CM

## 2019-07-03 LAB — GLUCOSE, CAPILLARY
Glucose-Capillary: 151 mg/dL — ABNORMAL HIGH (ref 70–99)
Glucose-Capillary: 271 mg/dL — ABNORMAL HIGH (ref 70–99)

## 2019-07-03 LAB — COMPREHENSIVE METABOLIC PANEL
ALT: 19 U/L (ref 0–44)
AST: 21 U/L (ref 15–41)
Albumin: 3.3 g/dL — ABNORMAL LOW (ref 3.5–5.0)
Alkaline Phosphatase: 82 U/L (ref 38–126)
Anion gap: 7 (ref 5–15)
BUN: 16 mg/dL (ref 6–20)
CO2: 22 mmol/L (ref 22–32)
Calcium: 8.2 mg/dL — ABNORMAL LOW (ref 8.9–10.3)
Chloride: 110 mmol/L (ref 98–111)
Creatinine, Ser: 1.86 mg/dL — ABNORMAL HIGH (ref 0.61–1.24)
GFR calc Af Amer: 49 mL/min — ABNORMAL LOW (ref >60)
GFR calc non Af Amer: 42 mL/min — ABNORMAL LOW (ref >60)
Glucose, Bld: 186 mg/dL — ABNORMAL HIGH (ref 70–99)
Potassium: 4.7 mmol/L (ref 3.5–5.1)
Sodium: 139 mmol/L (ref 135–145)
Total Bilirubin: 0.5 mg/dL (ref 0.3–1.2)
Total Protein: 6.3 g/dL — ABNORMAL LOW (ref 6.5–8.1)

## 2019-07-03 LAB — CBC
HCT: 40.9 % (ref 39.0–52.0)
Hemoglobin: 13 g/dL (ref 13.0–17.0)
MCH: 27.1 pg (ref 26.0–34.0)
MCHC: 31.8 g/dL (ref 30.0–36.0)
MCV: 85.2 fL (ref 80.0–100.0)
Platelets: 199 10*3/uL (ref 150–400)
RBC: 4.8 MIL/uL (ref 4.22–5.81)
RDW: 12.9 % (ref 11.5–15.5)
WBC: 7.6 10*3/uL (ref 4.0–10.5)
nRBC: 0 % (ref 0.0–0.2)

## 2019-07-03 MED ORDER — ISOSORBIDE MONONITRATE ER 60 MG PO TB24: 60.0000 mg | ORAL_TABLET | Freq: Every day | ORAL | 3 refills | Status: DC

## 2019-07-03 MED ORDER — ACETAMINOPHEN 325 MG PO TABS: 650.0000 mg | ORAL_TABLET | Freq: Four times a day (QID) | ORAL | 0 refills | Status: DC | PRN

## 2019-07-03 MED ORDER — CLOPIDOGREL BISULFATE 75 MG PO TABS: 75.0000 mg | ORAL_TABLET | Freq: Every day | ORAL | 6 refills | Status: DC

## 2019-07-03 MED ORDER — INSULIN GLARGINE 100 UNIT/ML ~~LOC~~ SOLN: 10.0000 [IU] | Freq: Every day | SUBCUTANEOUS | 11 refills | Status: DC

## 2019-07-03 MED ORDER — ATORVASTATIN CALCIUM 80 MG PO TABS: 80.0000 mg | ORAL_TABLET | Freq: Every day | ORAL | 11 refills | Status: DC

## 2019-07-03 MED ORDER — LOSARTAN POTASSIUM 50 MG PO TABS: 50.0000 mg | ORAL_TABLET | Freq: Every day | ORAL | 5 refills | Status: DC

## 2019-07-03 MED ORDER — INSULIN ASPART 100 UNIT/ML FLEXPEN: 0.0000 [IU] | PEN_INJECTOR | SUBCUTANEOUS | 11 refills | Status: DC

## 2019-07-03 MED ORDER — PANTOPRAZOLE SODIUM 40 MG PO TBEC: 40.0000 mg | DELAYED_RELEASE_TABLET | Freq: Every day | ORAL | 5 refills | Status: DC

## 2019-07-03 MED ORDER — ASPIRIN 81 MG PO CHEW: 81.0000 mg | CHEWABLE_TABLET | Freq: Every day | ORAL | 2 refills | Status: AC

## 2019-07-03 MED ORDER — LEVETIRACETAM 500 MG PO TABS: 500.0000 mg | ORAL_TABLET | Freq: Two times a day (BID) | ORAL | 2 refills | Status: DC

## 2019-07-03 MED ORDER — SERTRALINE HCL 50 MG PO TABS: 50.0000 mg | ORAL_TABLET | Freq: Every day | ORAL | 5 refills | Status: DC

## 2019-07-03 MED ORDER — CARVEDILOL 3.125 MG PO TABS: 3.1250 mg | ORAL_TABLET | Freq: Two times a day (BID) | ORAL | 3 refills | Status: DC

## 2019-07-03 MED ORDER — GABAPENTIN 300 MG PO CAPS: 300.0000 mg | ORAL_CAPSULE | Freq: Three times a day (TID) | ORAL | 3 refills | Status: DC

## 2019-07-03 NOTE — Procedures (Signed)
Patient Name: [name]  MRN: [id number]  Epilepsy Attending: Charlsie Quest  Referring Physician/Provider: Dr Kennis Carina Date: 07/02/2019 Duration: 25.17 mins  Patient history: 49 year old male who presented after an episode of loss of consciousness.  Per son patient suddenly became unresponsive, was foaming at mouth with his eyes rolled back and jerking of upper extremities. Patient's son also described 4 other similar episodes in the past.  Level of alertness: awake, asleep  AEDs during EEG study: gabapentin  Technical aspects: This EEG study was done with scalp electrodes positioned according to the 10-20 International system of electrode placement. Electrical activity was acquired at a sampling rate of 500Hz  and reviewed with a high frequency filter of 70Hz  and a low frequency filter of 1Hz . EEG data were recorded continuously and digitally stored.   DESCRIPTION: The posterior dominant rhythm consists of 9-10 Hz activity of moderate voltage (25-35 uV) seen predominantly in posterior head regions, symmetric and reactive to eye opening and eye closing. Sleep was characterized by vertex waves, sleep spindles (12-14hz ), maximal frontocentral. Photic driving was not seen during photic stimulation. Hyperventilation was not performed.  IMPRESSION: This study is within normal limits. No seizures or epileptiform discharges were seen throughout the recording.  Jennah Satchell 

## 2019-07-03 NOTE — Progress Notes (Signed)
Nsg Discharge Note  Admit Date:  07/01/2019 Discharge date: 07/03/2019   [name] to be D/C'd Home per MD order.  AVS completed.  Copy for chart, and copy for patient signed, and dated. Patient/caregiver able to verbalize understanding.  Discharge Medication: Allergies as of 07/03/2019      Reactions   Penicillins Rash   Has patient had a PCN reaction causing immediate rash, facial/tongue/throat swelling, SOB or lightheadedness with hypotension: Yes Has patient had a PCN reaction causing severe rash involving mucus membranes or skin necrosis: Yes Has patient had a PCN reaction that required hospitalization: No Has patient had a PCN reaction occurring within the last 10 years: Yes If all of the above answers are "NO", then may proceed with Cephalosporin use.      Medication List    STOP taking these medications   citalopram 40 MG tablet Commonly known as: CELEXA   glipiZIDE 10 MG tablet Commonly known as: GLUCOTROL   Jardiance 10 MG Tabs tablet Generic drug: empagliflozin     TAKE these medications   acetaminophen 325 MG tablet Commonly known as: TYLENOL Take 2 tablets (650 mg total) by mouth every 6 (six) hours as needed for mild pain or fever (or Fever >/= 101).   aspirin 81 MG chewable tablet Chew 1 tablet (81 mg total) by mouth daily with breakfast. What changed: when to take this   atorvastatin 80 MG tablet Commonly known as: LIPITOR Take 1 tablet (80 mg total) by mouth daily.   carvedilol 3.125 MG tablet Commonly known as: COREG Take 1 tablet (3.125 mg total) by mouth 2 (two) times daily with a meal. What changed: when to take this   clopidogrel 75 MG tablet Commonly known as: PLAVIX Take 1 tablet (75 mg total) by mouth daily.   gabapentin 300 MG capsule Commonly known as: NEURONTIN Take 1 capsule (300 mg total) by mouth 3 (three) times daily.   insulin aspart 100 UNIT/ML FlexPen Commonly known as: NOVOLOG Inject 0-10 Units into the skin See admin  instructions. insulin aspart (novoLOG) injection 0-10 Units 0-10 Units Subcutaneous, 3 times daily with meals CBG < 70: Implement Hypoglycemia Standing Orders and refer to Hypoglycemia Standing Orders sidebar report  CBG 70 - 120: 0 unit CBG 121 - 150: 0 unit  CBG 151 - 200: 1 unit CBG 201 - 250: 2 units CBG 251 - 300: 4 units CBG 301 - 350: 6 units  CBG 351 - 400: 8 units  CBG > 400: 10 units   insulin glargine 100 UNIT/ML injection Commonly known as: Lantus Inject 0.1 mLs (10 Units total) into the skin at bedtime.   isosorbide mononitrate 60 MG 24 hr tablet Commonly known as: IMDUR Take 1 tablet (60 mg total) by mouth daily.   levETIRAcetam 500 MG tablet Commonly known as: Keppra Take 1 tablet (500 mg total) by mouth 2 (two) times daily.   losartan 50 MG tablet Commonly known as: COZAAR Take 1 tablet (50 mg total) by mouth daily. What changed:   medication strength  how much to take   nitroGLYCERIN 0.4 MG SL tablet Commonly known as: NITROSTAT Place 1 tablet (0.4 mg total) under the tongue every 5 (five) minutes as needed for chest pain.   omega-3 fish oil 1000 MG Caps capsule Commonly known as: MAXEPA Take 1 capsule by mouth daily.   pantoprazole 40 MG tablet Commonly known as: PROTONIX Take 1 tablet (40 mg total) by mouth daily. Start taking on: July 04, 2019 What  changed:   medication strength  how much to take   sertraline 50 MG tablet Commonly known as: ZOLOFT Take 1 tablet (50 mg total) by mouth daily.       Discharge Assessment: Vitals:   07/03/19 0508 07/03/19 0824  BP: 130/90 (!) 143/101  Pulse: 81 72  Resp: 16   Temp: 98.4 F (36.9 C)   SpO2: 95%    Skin clean, dry and intact without evidence of skin break down, no evidence of skin tears noted. IV catheter discontinued intact. Site without signs and symptoms of complications - no redness or edema noted at insertion site, patient denies c/o pain - only slight tenderness at site.  Dressing with  slight pressure applied.  D/c Instructions-Education: Discharge instructions given to patient/family with verbalized understanding. D/c education completed with patient/family including follow up instructions, medication list, d/c activities limitations if indicated, with other d/c instructions as indicated by MD - patient able to verbalize understanding, all questions fully answered. Patient instructed to return to ED, call 911, or call MD for any changes in condition.  Patient escorted via WC, and D/C home via private auto.  Lonn Georgia, RN 07/03/2019 1:38 PM

## 2019-07-03 NOTE — Discharge Summary (Signed)
[name], is a 49 y.o. male  DOB 10/04/1970  MRN [id number].  Admission date:  07/01/2019  Admitting Physician  Ronne Savoia Mariea ClontsEmokpae, MD  Discharge Date:  07/03/2019   Primary MD  Toma DeitersHasanaj, Xaje A, MD  Recommendations for primary care physician for things to follow:   1) you are taking aspirin and Plavix for your heart disease or blood thinners so please Avoid ibuprofen/Advil/Aleve/Motrin/Goody Powders/Naproxen/BC powders/Meloxicam/Diclofenac/Indomethacin and other Nonsteroidal anti-inflammatory medications as these will make you more likely to bleed and can cause stomach ulcers, can also cause Kidney problems.   2)[place] [facility] [place], patients with seizures are not allowed to drive until they have been seizure-free for six months.  Use caution when using heavy equipment or power tools. Avoid working on ladders or at heights. Take showers instead of baths, Do not lock yourself in a room alone (i.e. bathroom).. Ensure the water temperature is not too high on the home water heater. Do not go swimming alone. When caring for infants or small children, sit down when holding, feeding, or changing them to minimize risk of injury to the child in the event you have a seizure.   Do not lock yourself in a room alone (i.e. bathroom).  Maintain good sleep hygiene. Avoid alcohol.  If patienthas another seizure, call 911 and bring them back to the ED if: A. The seizure lasts longer than 5 minutes.  B. The patient doesn't wake shortly after the seizure or has new problems such as difficulty seeing, speaking or moving following the seizure C. The patient was injured during the seizure D. The patient has a temperature over 102 F (39C) E. The patient vomited during the seizure and now is having trouble breathing  3)Please follow-up with Neurologist Dr. Beryle BeamsKofi Doonquah-- Phone: [phone], Address: [address], [place], [place] [id number] in 4 to 6 weeks for recheck and reevaluation.  Please call to make appointment with him  4) please note that there has been several changes to your medications--- including your insulin regimen --Please take your medications exactly as prescribed  5) insulin per sliding scale as noted here below -----insulin aspart (novoLOG) injection 0-10 Units 0-10 Units Subcutaneous, 3 times daily with meals CBG < 70: Implement Hypoglycemia Standing Orders and refer to Hypoglycemia Standing Orders sidebar report  CBG 70 - 120: 0 unit CBG 121 - 150: 0 unit  CBG 151 - 200: 1 unit CBG 201 - 250: 2 units CBG 251 - 300: 4 units CBG 301 - 350: 6 units  CBG 351 - 400: 8 units  CBG > 400: 10 units   Admission Diagnosis  Hyperkalemia [E87.5] Seizure (HCC) [R56.9]   Discharge Diagnosis  Hyperkalemia [E87.5] Seizure (HCC) [R56.9]    Principal Problem:   Loss of consciousness (HCC) Active Problems:   Type 2 diabetes mellitus, uncontrolled (HCC)   Essential hypertension   Major depression   Coronary artery disease involving native coronary artery of native heart with unstable angina pectoris (HCC)   Seizure (HCC)  Past Medical History:  Diagnosis Date  . Alcohol abuse   . CAD (coronary artery disease), native coronary artery    a. 01/2009 s/p Anterior MI and thrombectomy with BMS to mid LAD;  b. 06/2016 Cath/PCI: LM nl, LAD 25m/d, LCX nl, OM1/2/3 nl, RDA 17m. Initial attempt @ PCI failed followed by successful CTO LAD PCI and DES (2.5x38 Synergy)-->rec for indefinte DAPT.  . CKD (chronic kidney disease), stage III   . Essential hypertension   . History of nonadherence to medical treatment   . Hyperlipidemia   . Ischemic cardiomyopathy    a. EF 40% by nuc in 2012;  b. 06/2016 Echo: EF 40-45%, apicalanteroseptal and apical AK, Gr2 DD, mod Ca2+ Ao annulus, mild MR.  [name] Slurred speech - Chronic   . Type 2 diabetes mellitus ([place])     Past Surgical History:    Procedure Laterality Date  . CORONARY CTO INTERVENTION N/A 07/11/2016   Procedure: Coronary CTO Intervention;  Surgeon: Peter M Martinique, MD;  Location: [facility];  Service: Cardiovascular;  Laterality: N/A;  . CORONARY STENT INTERVENTION N/A 07/09/2016   Procedure: Coronary Stent Intervention;  Surgeon: Lorretta Harp, MD;  Location: [facility];  Service: Cardiovascular;  Laterality: N/A;  . CORONARY STENT INTERVENTION N/A 04/24/2019   Procedure: CORONARY STENT INTERVENTION;  Surgeon: Leonie Man, MD;  Location: [facility];  Service: Cardiovascular;  Laterality: N/A;  . Knee arthroscopic surgery Right   . LEFT HEART CATH AND CORONARY ANGIOGRAPHY N/A 07/05/2016   Procedure: Left Heart Cath and Coronary Angiography;  Surgeon: Belva Crome, MD;  Location: [facility];  Service: Cardiovascular;  Laterality: N/A;  . LEFT HEART CATH AND CORONARY ANGIOGRAPHY N/A 04/24/2019   Procedure: LEFT HEART CATH AND CORONARY ANGIOGRAPHY;  Surgeon: Leonie Man, MD;  Location: [facility];  Service: Cardiovascular;  Laterality: N/A;     HPI  from the history and physical done on the day of admission:    Chief Complaint: Loss of consciousness.  HPI: [name] is a 49 y.o. male with medical history significant for diabetes mellitus, hypertension, chronic renal failure, coronary artery disease, chronic stuttering speech. History is mostly obtained from son who is at bedside and chart reviewed, due to patient's speech difficulty and not remembering details of what happened. Patient was brought to the ED via EMS reports of losing consciousness while his son was driving the vehicle.  Patient was in the passenger seat.  Patient's son reports that patient's suddenly was not responding, was foaming at the mouth, and his eyes were rolled back, with jerking of his upper extremities.  Patient son pushed patient's head out of window in case patient vomited.  Patient vomited  once.  This is not the first time patient is losing consciousness.  Patient son describes at least 4 other episodes of similar occurrences.  Per ED provider and neurologist notes patient may have also had urinary incontinence today, but son is unable to confirm this to me.  Patient son reports that patient spouse noticed jerking of his extremities sometimes at night while sleeping.  Also unexplained episodes of fecal and sometimes urinary incontinence.  Patient son describes an episode when patient came into the house was staring at his spouse and suddenly dropped to the floor. Patient reports feeling nauseated before this episode started.  But he does not remember losing consciousness or other details surrounding this.  Patient son reports that patient was not  breathing and did not have a pulse.  But I do not see any record of this or that this was noted by EMS on arrival.   ED Course: Stable vitals.  Unremarkable CBC.  BMP shows creatinine 1.9, baseline 1.5-1.6.  Potassium 5.7..  Brain MRI with and without contrast as recommended by telemetry neurologist without acute intracranial abnormality.  Telemetry neurology evaluations also include loading with Keppra and EEG.    Hospital Course:     Brief Summary:- 49 y.o.malewith medical history significant fordiabetes mellitus, hypertension, chronic renal failure, coronary artery disease,chronic stuttering speech admitted on 07/01/2019 with unresponsive episode and jerking movements with concerns about possible seizures  A/p 1) possible seizures--- telemetry neurologist recommends Keppra  --EEG without epileptiform findings -CT head and brain MRI without acute findings -Prolactin level is 8.2, not elevated --Okay to discharge home with Keppra with outpatient follow-up with neurologist Dr. Gerilyn Pilgrim please see discharge instructions -Seizure precautions as outlined in discharge instructions - 2)H/o CAD--- LAD stent in 2018, RCA stent in January  2021 -No ACS type symptoms at this time -Continue Plavix, aspirin, isosorbide,, Coreg, Lipitor  3)HFrEF--echo from 04/29/2019 with EF of 35%--- continue Coreg, -Initially held losartan upon discharge and dose reduced to 50 mg daily due to due to hyperkalemia and renal concerns  4)DM2-A1c 8.0, reflecting uncontrolled DM PTA-  okay to restart glipizide,  follow-up with PCP for further adjustments, patient with history of noncompliance   5)AKI----acute kidney injury on CKD stage - IIIb--worsening renal function most likely due  dehydration - creatinine on admission=1.93  , baseline creatinine = 1.6 to 1.7   ,    creatinine improved to 1.8  -renally adjust medications, avoid nephrotoxic agents / dehydration  / hypotension Losartan adjusted  6) patient with recurrent episode of hyperkalemia--- losartan reduced to 50 mg daily  Disposition--discharge home with family  Code Status : full  Family Communication:   Wife and son at bedside  Consults  : Telemetry neurology  Discharge Condition: stable  Follow UP--neurology Dr. Gerilyn Pilgrim was advised    Consults obtained -telemetry neurology  Diet and Activity recommendation:  As advised  Discharge Instructions     Discharge Instructions    Call MD for:  difficulty breathing, headache or visual disturbances   Complete by: As directed    Call MD for:  persistant dizziness or light-headedness   Complete by: As directed    Call MD for:  persistant nausea and vomiting   Complete by: As directed    Call MD for:  temperature >100.4   Complete by: As directed    Diet - low sodium heart healthy   Complete by: As directed    Diet Carb Modified   Complete by: As directed    Discharge instructions   Complete by: As directed    1) you are taking aspirin and Plavix for your heart disease or blood thinners so please Avoid ibuprofen/Advil/Aleve/Motrin/Goody Powders/Naproxen/BC powders/Meloxicam/Diclofenac/Indomethacin and other Nonsteroidal  anti-inflammatory medications as these will make you more likely to bleed and can cause stomach ulcers, can also cause Kidney problems.   2)Per [facility] statutes, patients with seizures are not allowed to drive until they have been seizure-free for six months.  Use caution when using heavy equipment or power tools. Avoid working on ladders or at heights. Take showers instead of baths, Do not lock yourself in a room alone (i.e. bathroom).. Ensure the water temperature is not too high on the home water heater. Do not go swimming alone.  When caring for infants or small children, sit down when holding, feeding, or changing them to minimize risk of injury to the child in the event you have a seizure.   Do not lock yourself in a room alone (i.e. bathroom).  Maintain good sleep hygiene. Avoid alcohol.  If patienthas another seizure, call 911 and bring them back to the ED if: A. The seizure lasts longer than 5 minutes.  B. The patient doesn't wake shortly after the seizure or has new problems such as difficulty seeing, speaking or moving following the seizure C. The patient was injured during the seizure D. The patient has a temperature over 102 F (39C) E. The patient vomited during the seizure and now is having trouble breathing  3)Please follow-up with Neurologist Dr. Beryle Beams-- Phone: [phone], Address: [address], [place], [place] [address] in 4 to 6 weeks for recheck and reevaluation.  Please call to make appointment with him  4) please note that there has been several changes to your medications--- including your insulin regimen --Please take your medications exactly as prescribed  5) insulin per sliding scale as noted here below -----insulin aspart (novoLOG) injection 0-10 Units 0-10 Units Subcutaneous, 3 times daily with meals CBG < 70: Implement Hypoglycemia Standing Orders and refer to Hypoglycemia Standing Orders sidebar report  CBG 70 - 120: 0 unit CBG  121 - 150: 0 unit  CBG 151 - 200: 1 unit CBG 201 - 250: 2 units CBG 251 - 300: 4 units CBG 301 - 350: 6 units  CBG 351 - 400: 8 units  CBG > 400: 10 units   Increase activity slowly   Complete by: As directed         Discharge Medications     Allergies as of 07/03/2019      Reactions   Penicillins Rash   Has patient had a PCN reaction causing immediate rash, facial/tongue/throat swelling, SOB or lightheadedness with hypotension: Yes Has patient had a PCN reaction causing severe rash involving mucus membranes or skin necrosis: Yes Has patient had a PCN reaction that required hospitalization: No Has patient had a PCN reaction occurring within the last 10 years: Yes If all of the above answers are "NO", then may proceed with Cephalosporin use.      Medication List    STOP taking these medications   citalopram 40 MG tablet Commonly known as: CELEXA   glipiZIDE 10 MG tablet Commonly known as: GLUCOTROL   Jardiance 10 MG Tabs tablet Generic drug: empagliflozin     TAKE these medications   acetaminophen 325 MG tablet Commonly known as: TYLENOL Take 2 tablets (650 mg total) by mouth every 6 (six) hours as needed for mild pain or fever (or Fever >/= 101).   aspirin 81 MG chewable tablet Chew 1 tablet (81 mg total) by mouth daily with breakfast. What changed: when to take this   atorvastatin 80 MG tablet Commonly known as: LIPITOR Take 1 tablet (80 mg total) by mouth daily.   carvedilol 3.125 MG tablet Commonly known as: COREG Take 1 tablet (3.125 mg total) by mouth 2 (two) times daily with a meal. What changed: when to take this   clopidogrel 75 MG tablet Commonly known as: PLAVIX Take 1 tablet (75 mg total) by mouth daily.   gabapentin 300 MG capsule Commonly known as: NEURONTIN Take 1 capsule (300 mg total) by mouth 3 (three) times daily.   insulin aspart 100 UNIT/ML FlexPen Commonly known as: NOVOLOG  Inject 0-10 Units into the skin See admin instructions. insulin  aspart (novoLOG) injection 0-10 Units 0-10 Units Subcutaneous, 3 times daily with meals CBG < 70: Implement Hypoglycemia Standing Orders and refer to Hypoglycemia Standing Orders sidebar report  CBG 70 - 120: 0 unit CBG 121 - 150: 0 unit  CBG 151 - 200: 1 unit CBG 201 - 250: 2 units CBG 251 - 300: 4 units CBG 301 - 350: 6 units  CBG 351 - 400: 8 units  CBG > 400: 10 units   insulin glargine 100 UNIT/ML injection Commonly known as: Lantus Inject 0.1 mLs (10 Units total) into the skin at bedtime.   isosorbide mononitrate 60 MG 24 hr tablet Commonly known as: IMDUR Take 1 tablet (60 mg total) by mouth daily.   levETIRAcetam 500 MG tablet Commonly known as: Keppra Take 1 tablet (500 mg total) by mouth 2 (two) times daily.   losartan 50 MG tablet Commonly known as: COZAAR Take 1 tablet (50 mg total) by mouth daily. What changed:   medication strength  how much to take   nitroGLYCERIN 0.4 MG SL tablet Commonly known as: NITROSTAT Place 1 tablet (0.4 mg total) under the tongue every 5 (five) minutes as needed for chest pain.   omega-3 fish oil 1000 MG Caps capsule Commonly known as: MAXEPA Take 1 capsule by mouth daily.   pantoprazole 40 MG tablet Commonly known as: PROTONIX Take 1 tablet (40 mg total) by mouth daily. Start taking on: July 04, 2019 What changed:   medication strength  how much to take   sertraline 50 MG tablet Commonly known as: ZOLOFT Take 1 tablet (50 mg total) by mouth daily.       Major procedures and Radiology Reports - PLEASE review detailed and final reports for all details, in brief -    CT Head Wo Contrast  Result Date: 07/01/2019 CLINICAL DATA:  Seizure EXAM: CT HEAD WITHOUT CONTRAST TECHNIQUE: Contiguous axial images were obtained from the base of the skull through the vertex without intravenous contrast. COMPARISON:  CT head 08/19/2016 FINDINGS: Brain: No evidence of acute infarction, hemorrhage, hydrocephalus, extra-axial collection or mass  lesion/mass effect. Vascular: Negative for hyperdense vessel Skull: Negative Sinuses/Orbits: Mild mucosal edema paranasal sinuses. No orbital lesion. Other: None IMPRESSION: Negative CT head. Electronically Signed   By: Marlan Palau M.D.   On: 07/01/2019 14:27   MR Brain W and Wo Contrast  Result Date: 07/01/2019 CLINICAL DATA:  49 year old male status post seizure. EXAM: MRI HEAD WITHOUT AND WITH CONTRAST TECHNIQUE: Multiplanar, multiecho pulse sequences of the brain and surrounding structures were obtained without and with intravenous contrast. CONTRAST:  1mL GADAVIST GADOBUTROL 1 MMOL/ML IV SOLN COMPARISON:  Head CT earlier today. Report of [facility] CTA head and neck 02/16/2017 (no images available). FINDINGS: Brain: No restricted diffusion to suggest acute infarction. No midline shift, mass effect, evidence of mass lesion, ventriculomegaly, extra-axial collection or acute intracranial hemorrhage. Cervicomedullary junction and pituitary are within normal limits. Thin slice coronal images demonstrate symmetric hippocampal formations, within normal limits. No mesial temporal lobe signal abnormality identified. There is minimal nonspecific cerebral white matter T2 and FLAIR hyperintensity. No cortical encephalomalacia or definite chronic cerebral blood products. Deep gray nuclei, brainstem and cerebellum appear normal. No abnormal enhancement identified. No dural thickening. Vascular: Major intracranial vascular flow voids are preserved. The major dural venous sinuses are enhancing and appear to be patent. Skull and upper cervical spine: Negative visible cervical spine and bone marrow signal.  Sinuses/Orbits: Postoperative changes to both globes. Otherwise negative orbits. Mild to moderate scattered paranasal sinus mucosal thickening. Other: Mastoids are clear. Visible internal auditory structures appear normal. Trace retained secretions in the nasopharynx. IMPRESSION: 1. No acute intracranial  abnormality. Normal brain aside from minimal nonspecific white matter signal changes. 2. Mild to moderate paranasal sinus mucosal thickening. Electronically Signed   By: Odessa Fleming M.D.   On: 07/01/2019 17:20   EEG adult  Result Date: 07/03/2019 Charlsie Quest, MD     07/03/2019 10:40 AM Patient Name: [name] MRN: [id number] Epilepsy Attending: Charlsie Quest Referring Physician/Provider: Dr Kennis Carina Date: 07/02/2019 Duration: 25.17 mins Patient history: 49 year old male who presented after an episode of loss of consciousness.  Per son patient suddenly became unresponsive, was foaming at mouth with his eyes rolled back and jerking of upper extremities. Patient's son also described 4 other similar episodes in the past. Level of alertness: awake, asleep AEDs during EEG study: gabapentin Technical aspects: This EEG study was done with scalp electrodes positioned according to the 10-20 International system of electrode placement. Electrical activity was acquired at a sampling rate of  and reviewed with a high frequency filter of  and a low frequency filter of . EEG data were recorded continuously and digitally stored. DESCRIPTION: The posterior dominant rhythm consists of 9-10 Hz activity of moderate voltage (25-35 uV) seen predominantly in posterior head regions, symmetric and reactive to eye opening and eye closing. Sleep was characterized by vertex waves, sleep spindles (12-14hz ), maximal frontocentral. Photic driving was not seen during photic stimulation. Hyperventilation was not performed. IMPRESSION: This study is within normal limits. No seizures or epileptiform discharges were seen throughout the recording. Priyanka Annabelle Harman    Micro Results    Recent Results (from the past 240 hour(s))  SARS CORONAVIRUS 2 (TAT 6-24 HRS) Nasopharyngeal Nasopharyngeal Swab     Status: None   Collection Time: 07/01/19  5:24 PM   Specimen: Nasopharyngeal Swab  Result Value Ref Range Status    SARS Coronavirus 2 NEGATIVE NEGATIVE Final    Comment: (NOTE) SARS-CoV-2 target nucleic acids are NOT DETECTED. The SARS-CoV-2 RNA is generally detectable in upper and lower respiratory specimens during the acute phase of infection. Negative results do not preclude SARS-CoV-2 infection, do not rule out co-infections with other pathogens, and should not be used as the sole basis for treatment or other patient management decisions. Negative results must be combined with clinical observations, patient history, and epidemiological information. The expected result is Negative. Fact Sheet for Patients: HairSlick.no Fact Sheet for Healthcare Providers: quierodirigir.com This test is not yet approved or cleared by the [place] FDA and  has been authorized for detection and/or diagnosis of SARS-CoV-2 by FDA under an Emergency Use Authorization (EUA). This EUA will remain  in effect (meaning this test can be used) for the duration of the COVID-19 declaration under Section 56 4(b)(1) of the Act, 21 U.S.C. section 360bbb-3(b)(1), unless the authorization is terminated or revoked sooner. Performed at [facility], [address] [place], [place] [address]        Today   Subjective    [name] today has no new complaints No fever  Or chills   No Nausea, Vomiting or Diarrhea  Per son and wife patient appears back to baseline          Patient has been seen and examined prior to discharge   Objective   Blood pressure (!) 143/101, pulse 72, temperature  98.4 F (36.9 C), temperature source Oral, resp. rate 16, height 5\' 9"  (1.753 m), weight 77.6 kg, SpO2 95 %.   Intake/Output Summary (Last 24 hours) at 07/03/2019 1308 Last data filed at 07/02/2019 1700 Gross per 24 hour  Intake 240 ml  Output --  Net 240 ml   Exam Gen:- Awake Alert, no acute distress  HEENT:- [place].AT, No sclera icterus Neck-Supple Neck,No JVD,.   Lungs-  CTAB , good air movement bilaterally  CV- S1, S2 normal, regular Abd-  +ve B.Sounds, Abd Soft, No tenderness,    Extremity/Skin:- No  edema,   good pulses Psych-affect is appropriate, oriented x3 Neuro-chronic speech deficits (stutters), no new focal deficits, no tremors    Data Review   CBC w Diff:  Lab Results  Component Value Date   WBC 7.6 07/03/2019   HGB 13.0 07/03/2019   HGB 14.0 04/17/2019   HCT 40.9 07/03/2019   HCT 41.5 04/17/2019   PLT 199 07/03/2019   PLT 258 04/17/2019   LYMPHOPCT 27.7 10/30/2016   MONOPCT 9.3 10/30/2016   EOSPCT 1.8 10/30/2016   BASOPCT 0.5 10/30/2016    CMP:  Lab Results  Component Value Date   NA 139 07/03/2019   NA 141 06/04/2019   K 4.7 07/03/2019   CL 110 07/03/2019   CO2 22 07/03/2019   BUN 16 07/03/2019   BUN 11 06/04/2019   CREATININE 1.86 (H) 07/03/2019   CREATININE 1.79 (H) 05/19/2019   PROT 6.3 (L) 07/03/2019   ALBUMIN 3.3 (L) 07/03/2019   BILITOT 0.5 07/03/2019   ALKPHOS 82 07/03/2019   AST 21 07/03/2019   ALT 19 07/03/2019  .   Total Discharge time is about 33 minutes  09/02/2019 M.D on 07/03/2019 at 1:08 PM  Go to [email or web address] -  for contact info  [facility] - Office  [phone]

## 2019-07-03 NOTE — Progress Notes (Signed)
Telemetry called nurse at 9:52am stating patient had 12 beat run of vtach but now is sinus rhythm. MD made aware. Will continue to assess.

## 2019-07-04 LAB — HEMOGLOBIN A1C
Hgb A1c MFr Bld: 8 % — ABNORMAL HIGH (ref 4.8–5.6)
Mean Plasma Glucose: 183 mg/dL

## 2019-07-06 DIAGNOSIS — G40909 Epilepsy, unspecified, not intractable, without status epilepticus: Secondary | ICD-10-CM | POA: Diagnosis not present

## 2019-07-06 DIAGNOSIS — E1143 Type 2 diabetes mellitus with diabetic autonomic (poly)neuropathy: Secondary | ICD-10-CM | POA: Diagnosis not present

## 2019-07-06 DIAGNOSIS — I10 Essential (primary) hypertension: Secondary | ICD-10-CM | POA: Diagnosis not present

## 2019-07-28 DIAGNOSIS — G40909 Epilepsy, unspecified, not intractable, without status epilepticus: Secondary | ICD-10-CM | POA: Diagnosis not present

## 2019-07-28 DIAGNOSIS — L0212 Furuncle of neck: Secondary | ICD-10-CM | POA: Diagnosis not present

## 2019-07-28 DIAGNOSIS — I10 Essential (primary) hypertension: Secondary | ICD-10-CM | POA: Diagnosis not present

## 2019-07-28 DIAGNOSIS — I2510 Atherosclerotic heart disease of native coronary artery without angina pectoris: Secondary | ICD-10-CM | POA: Diagnosis not present

## 2019-07-28 DIAGNOSIS — E1143 Type 2 diabetes mellitus with diabetic autonomic (poly)neuropathy: Secondary | ICD-10-CM | POA: Diagnosis not present

## 2019-07-28 NOTE — Progress Notes (Signed)
Cardiology Office Note   Date:  07/29/2019   ID:  [name], DOB [birth year], MRN [id number]  PCP:  Toma Deiters, MD  Cardiologist:   Rollene Rotunda, MD   Chief Complaint  Patient presents with  . Dizziness      History of Present Illness: [name] is a 49 y.o. male who presents for evaluation of CAD s/p BMS to mLAD 2010 with subsequent ISR managed with PCI/DES to LAD in 2018, chronic combined CHF and ischemic cardiomyopathy. He underwent a LHC which occurred 04/24/2019 and showed severe 2 vessel CAD with 85% mRCA stenosis managed with PCI/DES with 2nd stent placed upstream for catheter related dissection; he had 80% dLAD disease which was not amenable to stenting, and mild-moderate ISR on previous p-mLAD stent. He was recommended for aspirin and plavix as well as aggressive risk factor modifications for remaining disease. Echo 04/29/19 showed EF 35-40%, G1DD, and no significant valvular abnormalities.  He had hyperkalemia and had his Entresto stopped.    Since he was last seen he had a seizure.  I reviewed these records for this visit.  He was in the hospital and there was no significant abnormality on CT or MRI.  This was not felt to be a syncopal spell.  He was seen by neurology and was sent home on Keppra.  I do note that his creatinine was mildly elevated but stable.  His potassium which had been elevated on Entresto was back down to normal.  He did have his Cozaar held temporarily during that hospitalization but was sent home on a reduced dose.  He said he did have blood work yesterday with his primary physician.  He has had no further episodes similar to this.  He is getting some lightheadedness.  It is difficult to know whether this is associated with position.  He has some shortness of breath if he climbs a flight of stairs.  However, he is not having any chest pressure, neck or arm discomfort per his description.  He is not having any swelling.  He is much improved compared to  prior to his angioplasty.    Past Medical History:  Diagnosis Date  . Alcohol abuse   . CAD (coronary artery disease), native coronary artery    a. 01/2009 s/p Anterior MI and thrombectomy with BMS to mid LAD;  b. 06/2016 Cath/PCI: LM nl, LAD 42m/d, LCX nl, OM1/2/3 nl, RDA 61m. Initial attempt @ PCI failed followed by successful CTO LAD PCI and DES (2.5x38 Synergy)-->rec for indefinte DAPT.  . CKD (chronic kidney disease), stage III   . Essential hypertension   . History of nonadherence to medical treatment   . Hyperlipidemia   . Ischemic cardiomyopathy    a. EF 40% by nuc in 2012;  b. 06/2016 Echo: EF 40-45%, apicalanteroseptal and apical AK, Gr2 DD, mod Ca2+ Ao annulus, mild MR.  [name] Slurred speech - Chronic   . Type 2 diabetes mellitus (HCC)     Past Surgical History:  Procedure Laterality Date  . CORONARY CTO INTERVENTION N/A 07/11/2016   Procedure: Coronary CTO Intervention;  Surgeon: Peter M Swaziland, MD;  Location: [facility] INVASIVE CV LAB;  Service: Cardiovascular;  Laterality: N/A;  . CORONARY STENT INTERVENTION N/A 07/09/2016   Procedure: Coronary Stent Intervention;  Surgeon: Runell Gess, MD;  Location: [facility] INVASIVE CV LAB;  Service: Cardiovascular;  Laterality: N/A;  . CORONARY STENT INTERVENTION N/A 04/24/2019   Procedure: CORONARY STENT INTERVENTION;  Surgeon: Marykay Lex, MD;  Location: [facility] INVASIVE CV LAB;  Service: Cardiovascular;  Laterality: N/A;  . Knee arthroscopic surgery Right   . LEFT HEART CATH AND CORONARY ANGIOGRAPHY N/A 07/05/2016   Procedure: Left Heart Cath and Coronary Angiography;  Surgeon: Lyn Records, MD;  Location: [facility] INVASIVE CV LAB;  Service: Cardiovascular;  Laterality: N/A;  . LEFT HEART CATH AND CORONARY ANGIOGRAPHY N/A 04/24/2019   Procedure: LEFT HEART CATH AND CORONARY ANGIOGRAPHY;  Surgeon: Marykay Lex, MD;  Location: [facility] INVASIVE CV LAB;  Service: Cardiovascular;  Laterality: N/A;     Current Outpatient Medications  Medication Sig Dispense  Refill  . acetaminophen (TYLENOL) 325 MG tablet Take 2 tablets (650 mg total) by mouth every 6 (six) hours as needed for mild pain or fever (or Fever >/= 101). 100 tablet 0  . aspirin 81 MG chewable tablet Chew 1 tablet (81 mg total) by mouth daily with breakfast. 120 tablet 2  . atorvastatin (LIPITOR) 80 MG tablet Take 1 tablet (80 mg total) by mouth daily. 30 tablet 11  . carvedilol (COREG) 3.125 MG tablet Take 1 tablet (3.125 mg total) by mouth 2 (two) times daily with a meal. 180 tablet 3  . clopidogrel (PLAVIX) 75 MG tablet Take 1 tablet (75 mg total) by mouth daily. 30 tablet 6  . gabapentin (NEURONTIN) 300 MG capsule Take 1 capsule (300 mg total) by mouth 3 (three) times daily. 90 capsule 3  . insulin aspart (NOVOLOG) 100 UNIT/ML FlexPen Inject 0-10 Units into the skin See admin instructions. insulin aspart (novoLOG) injection 0-10 Units 0-10 Units Subcutaneous, 3 times daily with meals CBG < 70: Implement Hypoglycemia Standing Orders and refer to Hypoglycemia Standing Orders sidebar report  CBG 70 - 120: 0 unit CBG 121 - 150: 0 unit  CBG 151 - 200: 1 unit CBG 201 - 250: 2 units CBG 251 - 300: 4 units CBG 301 - 350: 6 units  CBG 351 - 400: 8 units  CBG > 400: 10 units 15 mL 11  . insulin glargine (LANTUS) 100 UNIT/ML injection Inject 0.1 mLs (10 Units total) into the skin at bedtime. 10 mL 11  . isosorbide mononitrate (IMDUR) 60 MG 24 hr tablet Take 1 tablet (60 mg total) by mouth daily. 90 tablet 3  . levETIRAcetam (KEPPRA) 500 MG tablet Take 1 tablet (500 mg total) by mouth 2 (two) times daily. 60 tablet 2  . losartan (COZAAR) 50 MG tablet Take 1 tablet (50 mg total) by mouth daily. 30 tablet 5  . nitroGLYCERIN (NITROSTAT) 0.4 MG SL tablet Place 1 tablet (0.4 mg total) under the tongue every 5 (five) minutes as needed for chest pain. 25 tablet 2  . omega-3 fish oil (MAXEPA) 1000 MG CAPS capsule Take 1 capsule by mouth daily.    . pantoprazole (PROTONIX) 40 MG tablet Take 1 tablet (40 mg total)  by mouth daily. 30 tablet 5  . sertraline (ZOLOFT) 50 MG tablet Take 1 tablet (50 mg total) by mouth daily. 30 tablet 5   No current facility-administered medications for this visit.    Allergies:   Penicillins    ROS:  Please see the history of present illness.   Otherwise, review of systems are positive for none.   All other systems are reviewed and negative.    PHYSICAL EXAM: VS:  Ht 5\' 9"  (1.753 m)   Wt 170 lb (77.1 kg)   BMI 25.10 kg/m  , BMI Body mass index is 25.1 kg/m. GENERAL:  Well appearing NECK:  No jugular venous distention, waveform within normal limits, carotid upstroke brisk and symmetric, no bruits, no thyromegaly LUNGS:  Clear to auscultation bilaterally CHEST:  Unremarkable HEART:  PMI not displaced or sustained,S1 and S2 within normal limits, no S3, no S4, no clicks, no rubs, no murmurs ABD:  Flat, positive bowel sounds normal in frequency in pitch, no bruits, no rebound, no guarding, no midline pulsatile mass, no hepatomegaly, no splenomegaly EXT:  2 plus pulses throughout, no edema, no cyanosis no clubbing   EKG:  EKG is not ordered today.    Recent Labs: 07/01/2019: Magnesium 1.5; TSH 1.972 07/03/2019: ALT 19; BUN 16; Creatinine, Ser 1.86; Hemoglobin 13.0; Platelets 199; Potassium 4.7; Sodium 139    Lipid Panel    Component Value Date/Time   CHOL 146 07/27/2016 1039   TRIG 170 (H) 07/27/2016 1039   HDL 62 07/27/2016 1039   CHOLHDL 2.4 07/27/2016 1039   CHOLHDL 2.1 07/06/2016 0534   VLDL 19 07/06/2016 0534   LDLCALC 50 07/27/2016 1039      Wt Readings from Last 3 Encounters:  07/29/19 170 lb (77.1 kg)  07/01/19 171 lb (77.6 kg)  06/04/19 171 lb (77.6 kg)      Other studies Reviewed: Additional studies/ records that were reviewed today include: Hospital recrods. Review of the above records demonstrates:  Please see elsewhere in the note.     ASSESSMENT AND PLAN:  CAD:     He is having no symptoms.  No further cardiovascular testing is  suggested.  He should continue with risk reduction.  Hypertension: Blood pressure is at target.  No change in therapy.  Hyperlipidemia:    Total cholesterol is 174.  HDL is 45.  I do not have the most recent LDL although previously it was 50.  I will try to obtain the records from his primary provider.   DM II:     Last A1c that I can see was 8.0 in April.  This is being managed by Neale Burly, MD.  I will suggest consideration of a SGLT2i or GLP1ra but will defer to the PCP.  CKD stage III:   Creatinine was most recently 1.86.  I will obtain the records from his primary provider who is following up on this.    Ischemic cardiomyopathy:  His ejection fraction was 35 to 40% which is mildly lower.  He is not tolerating further med titration.  I will follow up with echocardiography in the future.   DIZZINESS: He was not orthostatic in the office today.  I will apply a 2-week monitor to further evaluate.  This certainly could be related to the Hydro as its 1 new medications.  Current medicines are reviewed at length with the patient today.  The patient does not have concerns regarding medicines.  The following changes have been made:  no change  Labs/ tests ordered today include:   Orders Placed This Encounter  Procedures  . LONG TERM MONITOR (3-14 DAYS)     Disposition:   FU with me in 2 months.     Signed, [name], MD  07/29/2019 2:03 PM    [place] [facility]

## 2019-07-29 ENCOUNTER — Other Ambulatory Visit: Payer: Self-pay

## 2019-07-29 ENCOUNTER — Telehealth: Payer: Self-pay | Admitting: Radiology

## 2019-07-29 ENCOUNTER — Ambulatory Visit (INDEPENDENT_AMBULATORY_CARE_PROVIDER_SITE_OTHER): Payer: Medicaid Other | Admitting: Cardiology

## 2019-07-29 ENCOUNTER — Encounter: Payer: Self-pay | Admitting: Cardiology

## 2019-07-29 VITALS — Ht 69.0 in | Wt 170.0 lb

## 2019-07-29 DIAGNOSIS — N1832 Chronic kidney disease, stage 3b: Secondary | ICD-10-CM

## 2019-07-29 DIAGNOSIS — R42 Dizziness and giddiness: Secondary | ICD-10-CM | POA: Diagnosis not present

## 2019-07-29 DIAGNOSIS — I2510 Atherosclerotic heart disease of native coronary artery without angina pectoris: Secondary | ICD-10-CM

## 2019-07-29 DIAGNOSIS — E785 Hyperlipidemia, unspecified: Secondary | ICD-10-CM

## 2019-07-29 DIAGNOSIS — I10 Essential (primary) hypertension: Secondary | ICD-10-CM

## 2019-07-29 DIAGNOSIS — Z7189 Other specified counseling: Secondary | ICD-10-CM

## 2019-07-29 DIAGNOSIS — I255 Ischemic cardiomyopathy: Secondary | ICD-10-CM

## 2019-07-29 DIAGNOSIS — E118 Type 2 diabetes mellitus with unspecified complications: Secondary | ICD-10-CM | POA: Diagnosis not present

## 2019-07-29 DIAGNOSIS — Z794 Long term (current) use of insulin: Secondary | ICD-10-CM | POA: Diagnosis not present

## 2019-07-29 NOTE — Telephone Encounter (Signed)
Enrolled patient for a 14 day Zio monitor to be mailed to patients home.  

## 2019-07-29 NOTE — Patient Instructions (Signed)
Medication Instructions:  The current medical regimen is effective;  continue present plan and medications.  *If you need a refill on your cardiac medications before your next appointment, please call your pharmacy*  Testing/Procedures: ZIO XT- Long Term Monitor Instructions   Your physician has requested you wear your ZIO patch monitor 14 days.   This is a single patch monitor.  [employer] supplies one patch monitor per enrollment.  Additional stickers are not available.   Please do not apply patch if you will be having a Nuclear Stress Test, Echocardiogram, Cardiac CT, MRI, or Chest Xray during the time frame you would be wearing the monitor. The patch cannot be worn during these tests.  You cannot remove and re-apply the ZIO XT patch monitor.   Your ZIO patch monitor will be sent USPS Priority mail from [employer] directly to your home address. The monitor may also be mailed to a PO BOX if home delivery is not available.   It may take 3-5 days to receive your monitor after you have been enrolled.   Once you have received you monitor, please review enclosed instructions.  Your monitor has already been registered assigning a specific monitor serial # to you.   Applying the monitor   Shave hair from upper left chest.   Hold abrader disc by orange tab.  Rub abrader in 40 strokes over left upper chest as indicated in your monitor instructions.   Clean area with 4 enclosed alcohol pads .  Use all pads to assure are is cleaned thoroughly.  Let dry.   Apply patch as indicated in monitor instructions.  Patch will be place under collarbone on left side of chest with arrow pointing upward.   Rub patch adhesive wings for 2 minutes.Remove white label marked "1".  Remove white label marked "2".  Rub patch adhesive wings for 2 additional minutes.   While looking in a mirror, press and release button in center of patch.  A small green light will flash 3-4 times .  This will be your only  indicator the monitor has been turned on.     Do not shower for the first 24 hours.  You may shower after the first 24 hours.   Press button if you feel a symptom. You will hear a small click.  Record Date, Time and Symptom in the Patient Log Book.   When you are ready to remove patch, follow instructions on last 2 pages of Patient Log Book.  Stick patch monitor onto last page of Patient Log Book.   Place Patient Log Book in Blue box.  Use locking tab on box and tape box closed securely.  The Orange and White box has prepaid postage on it.  Please place in mailbox as soon as possible.  Your physician should have your test results approximately 7 days after the monitor has been mailed back to [employer].   Call [employer] Customer Care at [phone] if you have questions regarding your ZIO XT patch monitor.  Call them immediately if you see an orange light blinking on your monitor.   If your monitor falls off in less than 4 days contact our Monitor department at [phone].  If your monitor becomes loose or falls off after 4 days call [employer] at [phone] for suggestions on securing your monitor.   Follow-Up: At [facility], you and your health needs are our priority.  As part of our continuing mission to provide you with exceptional heart care, we have   created designated Provider Care Teams.  These Care Teams include your primary Cardiologist (physician) and Advanced Practice Providers (APPs -  Physician Assistants and Nurse Practitioners) who all work together to provide you with the care you need, when you need it.  We recommend signing up for the patient portal called "MyChart".  Sign up information is provided on this After Visit Summary.  MyChart is used to connect with patients for Virtual Visits (Telemedicine).  Patients are able to view lab/test results, encounter notes, upcoming appointments, etc.  Non-urgent messages can be sent to your provider as well.   To learn  more about what you can do with MyChart, go to [email or web address].    Your next appointment:   2 month(s)  The format for your next appointment:   In Person  Provider:   Rollene Rotunda, MD  Thank you for choosing [facility]!!

## 2019-08-03 ENCOUNTER — Telehealth: Payer: Self-pay | Admitting: Cardiology

## 2019-08-03 NOTE — Telephone Encounter (Signed)
PCP prescribed patient sodium bicarbonate wants to make sure it's okay to take before he starts taking it.  Please advise.

## 2019-08-03 NOTE — Telephone Encounter (Signed)
Returned call to wife with pharmacist advice. She voiced understanding.   PCP Rx'ed for patient's kidneys, per wife's report

## 2019-08-03 NOTE — Telephone Encounter (Signed)
I don't see any direct drug interactions.  Would recommend he take sodium bicarb about 2 hours apart from other meds

## 2019-08-03 NOTE — Telephone Encounter (Signed)
Sent to [facility] for med review of sodium bicarbonate with current meds

## 2019-08-10 ENCOUNTER — Other Ambulatory Visit: Payer: Self-pay

## 2019-08-10 ENCOUNTER — Ambulatory Visit (INDEPENDENT_AMBULATORY_CARE_PROVIDER_SITE_OTHER): Payer: Medicaid Other

## 2019-08-10 ENCOUNTER — Ambulatory Visit (HOSPITAL_COMMUNITY)
Admission: RE | Admit: 2019-08-10 | Discharge: 2019-08-10 | Disposition: A | Discharge status: Home / Self Care | Payer: Medicaid Other | Source: Ambulatory Visit | Attending: Medical | Admitting: Medical

## 2019-08-10 DIAGNOSIS — R42 Dizziness and giddiness: Secondary | ICD-10-CM

## 2019-08-10 DIAGNOSIS — I2510 Atherosclerotic heart disease of native coronary artery without angina pectoris: Secondary | ICD-10-CM | POA: Diagnosis not present

## 2019-08-10 NOTE — Progress Notes (Signed)
*  PRELIMINARY RESULTS* Echocardiogram 2D Echocardiogram has been performed.  [name] 08/10/2019, 11:12 AM

## 2019-08-13 ENCOUNTER — Telehealth: Payer: Self-pay | Admitting: Cardiology

## 2019-08-13 NOTE — Telephone Encounter (Signed)
New Message   pts wife is calling and is wanting the Echo results  She also says the pt is wanting to take the monitor off and is wondering if he can do this    Please advise

## 2019-08-13 NOTE — Telephone Encounter (Signed)
Pt's wife aware echo has not been reviewed at this time and once it has will call with results and also instructed that pt needs to wear monitor for 2 weeks if can tolerate Pt has only had for 3 days Pt's wife verbalized understanding and agrees with plan ./cy

## 2019-08-14 NOTE — Telephone Encounter (Signed)
Follow up    Pts wife is calling for echo results    Please advise

## 2019-08-14 NOTE — Telephone Encounter (Signed)
Spoke with pt wife, aware we are waiting on md review.

## 2019-08-17 NOTE — Telephone Encounter (Signed)
This result went to [place].  I see she resulted it this weekend.  EF was improved.  No change in therapy.

## 2019-08-17 NOTE — Telephone Encounter (Signed)
Spoke with patient, results reviewed. Patient verbalized understanding.

## 2019-08-26 ENCOUNTER — Encounter: Payer: Self-pay | Admitting: Neurology

## 2019-09-12 ENCOUNTER — Emergency Department (HOSPITAL_COMMUNITY): Payer: Medicaid Other

## 2019-09-12 ENCOUNTER — Emergency Department (HOSPITAL_COMMUNITY)
Admission: EM | Admit: 2019-09-12 | Discharge: 2019-09-12 | Disposition: A | Discharge status: Home / Self Care | Payer: Medicaid Other | Attending: Emergency Medicine | Admitting: Emergency Medicine

## 2019-09-12 ENCOUNTER — Encounter (HOSPITAL_COMMUNITY): Payer: Self-pay | Admitting: *Deleted

## 2019-09-12 ENCOUNTER — Other Ambulatory Visit: Payer: Self-pay

## 2019-09-12 DIAGNOSIS — I2510 Atherosclerotic heart disease of native coronary artery without angina pectoris: Secondary | ICD-10-CM | POA: Diagnosis not present

## 2019-09-12 DIAGNOSIS — Z7982 Long term (current) use of aspirin: Secondary | ICD-10-CM | POA: Diagnosis not present

## 2019-09-12 DIAGNOSIS — W19XXXA Unspecified fall, initial encounter: Secondary | ICD-10-CM | POA: Diagnosis not present

## 2019-09-12 DIAGNOSIS — R42 Dizziness and giddiness: Secondary | ICD-10-CM | POA: Diagnosis present

## 2019-09-12 DIAGNOSIS — Z955 Presence of coronary angioplasty implant and graft: Secondary | ICD-10-CM | POA: Diagnosis not present

## 2019-09-12 DIAGNOSIS — N183 Chronic kidney disease, stage 3 unspecified: Secondary | ICD-10-CM | POA: Diagnosis not present

## 2019-09-12 DIAGNOSIS — R55 Syncope and collapse: Secondary | ICD-10-CM | POA: Diagnosis not present

## 2019-09-12 DIAGNOSIS — E1122 Type 2 diabetes mellitus with diabetic chronic kidney disease: Secondary | ICD-10-CM | POA: Insufficient documentation

## 2019-09-12 DIAGNOSIS — R4020 Unspecified coma: Secondary | ICD-10-CM | POA: Diagnosis not present

## 2019-09-12 DIAGNOSIS — R519 Headache, unspecified: Secondary | ICD-10-CM | POA: Diagnosis not present

## 2019-09-12 DIAGNOSIS — I129 Hypertensive chronic kidney disease with stage 1 through stage 4 chronic kidney disease, or unspecified chronic kidney disease: Secondary | ICD-10-CM | POA: Diagnosis not present

## 2019-09-12 DIAGNOSIS — Z794 Long term (current) use of insulin: Secondary | ICD-10-CM | POA: Diagnosis not present

## 2019-09-12 DIAGNOSIS — R52 Pain, unspecified: Secondary | ICD-10-CM | POA: Diagnosis not present

## 2019-09-12 DIAGNOSIS — E860 Dehydration: Secondary | ICD-10-CM | POA: Insufficient documentation

## 2019-09-12 DIAGNOSIS — R001 Bradycardia, unspecified: Secondary | ICD-10-CM | POA: Diagnosis not present

## 2019-09-12 LAB — BASIC METABOLIC PANEL
Anion gap: 8 (ref 5–15)
Anion gap: 7 (ref 5–15)
BUN: 18 mg/dL (ref 6–20)
BUN: 18 mg/dL (ref 6–20)
CO2: 24 mmol/L (ref 22–32)
CO2: 24 mmol/L (ref 22–32)
Calcium: 8.4 mg/dL — ABNORMAL LOW (ref 8.9–10.3)
Calcium: 7.9 mg/dL — ABNORMAL LOW (ref 8.9–10.3)
Chloride: 103 mmol/L (ref 98–111)
Chloride: 106 mmol/L (ref 98–111)
Creatinine, Ser: 1.83 mg/dL — ABNORMAL HIGH (ref 0.61–1.24)
Creatinine, Ser: 1.8 mg/dL — ABNORMAL HIGH (ref 0.61–1.24)
GFR calc Af Amer: 49 mL/min — ABNORMAL LOW (ref >60)
GFR calc Af Amer: 50 mL/min — ABNORMAL LOW (ref >60)
GFR calc non Af Amer: 43 mL/min — ABNORMAL LOW (ref >60)
GFR calc non Af Amer: 44 mL/min — ABNORMAL LOW (ref >60)
Glucose, Bld: 190 mg/dL — ABNORMAL HIGH (ref 70–99)
Glucose, Bld: 158 mg/dL — ABNORMAL HIGH (ref 70–99)
Potassium: 5.4 mmol/L — ABNORMAL HIGH (ref 3.5–5.1)
Potassium: 4.9 mmol/L (ref 3.5–5.1)
Sodium: 135 mmol/L (ref 135–145)
Sodium: 137 mmol/L (ref 135–145)

## 2019-09-12 LAB — CBC WITH DIFFERENTIAL/PLATELET
Abs Immature Granulocytes: 0.04 10*3/uL (ref 0.00–0.07)
Basophils Absolute: 0 10*3/uL (ref 0.0–0.1)
Basophils Relative: 1 %
Eosinophils Absolute: 0.2 10*3/uL (ref 0.0–0.5)
Eosinophils Relative: 2 %
HCT: 45 % (ref 39.0–52.0)
Hemoglobin: 14.9 g/dL (ref 13.0–17.0)
Immature Granulocytes: 1 %
Lymphocytes Relative: 21 %
Lymphs Abs: 1.9 10*3/uL (ref 0.7–4.0)
MCH: 27.8 pg (ref 26.0–34.0)
MCHC: 33.1 g/dL (ref 30.0–36.0)
MCV: 84 fL (ref 80.0–100.0)
Monocytes Absolute: 0.6 10*3/uL (ref 0.1–1.0)
Monocytes Relative: 7 %
Neutro Abs: 6.1 10*3/uL (ref 1.7–7.7)
Neutrophils Relative %: 68 %
Platelets: 262 10*3/uL (ref 150–400)
RBC: 5.36 MIL/uL (ref 4.22–5.81)
RDW: 12.9 % (ref 11.5–15.5)
WBC: 8.8 10*3/uL (ref 4.0–10.5)
nRBC: 0 % (ref 0.0–0.2)

## 2019-09-12 LAB — CK: Total CK: 83 U/L (ref 49–397)

## 2019-09-12 LAB — TROPONIN I (HIGH SENSITIVITY)
Troponin I (High Sensitivity): 6 ng/L (ref <18)
Troponin I (High Sensitivity): 6 ng/L (ref <18)

## 2019-09-12 LAB — CBG MONITORING, ED: Glucose-Capillary: 170 mg/dL — ABNORMAL HIGH (ref 70–99)

## 2019-09-12 MED ORDER — LEVETIRACETAM IN NACL 500 MG/100ML IV SOLN
500.0000 mg | Freq: Once | INTRAVENOUS | Status: AC
  Administered 2019-09-12: 500 mg via INTRAVENOUS
  Filled 2019-09-12: qty 100

## 2019-09-12 MED ORDER — SODIUM CHLORIDE 0.9 % IV BOLUS
1000.0000 mL | Freq: Once | INTRAVENOUS | Status: AC
  Administered 2019-09-12: 1000 mL via INTRAVENOUS

## 2019-09-12 NOTE — ED Triage Notes (Signed)
Per EMS , reports he was having some chest pain and a headache , walked into the living room at home and per family he passed out. EMS reports b/p 100/60 lying upon their arrival. Pt became lightheaded when they attempted to do sitting b/p. Pt reports headahce and chest pain resolved. Denies pain. Has not had anything to eat or drink today

## 2019-09-12 NOTE — ED Provider Notes (Signed)
[facility] EMERGENCY DEPARTMENT Provider Note   CSN: [id number] Arrival date & time: 09/12/19  1321     History Chief Complaint  Patient presents with   Hypotension    Toy Eisemann is a 49 y.o. male.  Patient had an episode where he felt dizzy in the kitchen and passed out.  Patient has a history of similar episodes.  He has history of coronary artery disease and seizures.  Patient did not have any seizure activity according to his son  The history is provided by the patient and a relative. No language interpreter was used.  Loss of Consciousness Episode history:  Single Most recent episode:  Today Timing:  Intermittent Progression:  Resolved Chronicity:  New Context: not blood draw   Witnessed: no   Relieved by:  Nothing Worsened by:  Nothing Ineffective treatments:  None tried Associated symptoms: dizziness   Associated symptoms: no anxiety, no chest pain, no headaches and no seizures   Risk factors: no congenital heart disease        Past Medical History:  Diagnosis Date   Alcohol abuse    CAD (coronary artery disease), native coronary artery    a. 01/2009 s/p Anterior MI and thrombectomy with BMS to mid LAD;  b. 06/2016 Cath/PCI: LM nl, LAD 64m/d, LCX nl, OM1/2/3 nl, RDA 43m. Initial attempt @ PCI failed followed by successful CTO LAD PCI and DES (2.5x38 Synergy)-->rec for indefinte DAPT.   CKD (chronic kidney disease), stage III    Essential hypertension    History of nonadherence to medical treatment    Hyperlipidemia    Ischemic cardiomyopathy    a. EF 40% by nuc in 2012;  b. 06/2016 Echo: EF 40-45%, apicalanteroseptal and apical AK, Gr2 DD, mod Ca2+ Ao annulus, mild MR.   Slurred speech - Chronic    Type 2 diabetes mellitus [facility])     Patient Active Problem List   Diagnosis Date Noted   Seizure (HCC) 07/02/2019   Loss of consciousness (HCC) 07/01/2019   Ischemic cardiomyopathy 03/29/2019   Educated about COVID-19 virus infection 03/29/2019     Type 2 diabetes mellitus with complication, with long-term current use of insulin (HCC) 03/12/2018   Long-term use of high-risk medication 03/12/2018   Coronary artery disease involving native coronary artery of native heart with unstable angina pectoris (HCC) 10/31/2016   Epistaxis 08/18/2016   Chronic kidney disease (CKD), active medical management without dialysis, stage 3 (moderate)    Unstable angina (HCC)    Acute renal failure superimposed on stage 3 chronic kidney disease (HCC)    Chest pain 07/05/2016   Major depression 08/22/2011   Hyperlipidemia 02/22/2009   CORONARY ATHEROSCLEROSIS NATIVE CORONARY ARTERY 02/22/2009   Type 2 diabetes mellitus, uncontrolled (HCC) 02/21/2009   Essential hypertension 02/21/2009    Past Surgical History:  Procedure Laterality Date   CORONARY CTO INTERVENTION N/A 07/11/2016   Procedure: Coronary CTO Intervention;  Surgeon: Peter M Swaziland, MD;  Location: [facility] INVASIVE CV LAB;  Service: Cardiovascular;  Laterality: N/A;   CORONARY STENT INTERVENTION N/A 07/09/2016   Procedure: Coronary Stent Intervention;  Surgeon: Runell Gess, MD;  Location: [facility] INVASIVE CV LAB;  Service: Cardiovascular;  Laterality: N/A;   CORONARY STENT INTERVENTION N/A 04/24/2019   Procedure: CORONARY STENT INTERVENTION;  Surgeon: Marykay Lex, MD;  Location: [facility] INVASIVE CV LAB;  Service: Cardiovascular;  Laterality: N/A;   Knee arthroscopic surgery Right    LEFT HEART CATH AND CORONARY ANGIOGRAPHY N/A 07/05/2016   Procedure: Left  Heart Cath and Coronary Angiography;  Surgeon: Lyn RecordsHenry W Smith, MD;  [facility];  Service: Cardiovascular;  Laterality: N/A;   LEFT HEART CATH AND CORONARY ANGIOGRAPHY N/A 04/24/2019   Procedure: LEFT HEART CATH AND CORONARY ANGIOGRAPHY;  Surgeon: Marykay LexHarding, David W, MD;  [facility];  Service: Cardiovascular;  Laterality: N/A;       Family History  Problem Relation Age of Onset   Diabetes  Mellitus II Other    Heart disease Mother     Social History   Tobacco Use   Smoking status: Never Smoker   Smokeless tobacco: Never Used  Vaping Use   Vaping Use: Never used  Substance Use Topics   Alcohol use: No    Alcohol/week: 1.0 standard drink    Types: 1 Cans of beer per week   Drug use: No    Home Medications Prior to Admission medications   Medication Sig Start Date End Date Taking? Authorizing Provider  aspirin 81 MG chewable tablet Chew 1 tablet (81 mg total) by mouth daily with breakfast. 07/03/19  Yes Emokpae, Courage, MD  atorvastatin (LIPITOR) 80 MG tablet Take 1 tablet (80 mg total) by mouth daily. Patient taking differently: Take 80 mg by mouth at bedtime.  07/03/19  Yes Shon HaleEmokpae, Courage, MD  carvedilol (COREG) 3.125 MG tablet Take 1 tablet (3.125 mg total) by mouth 2 (two) times daily with a meal. 07/03/19 10/01/19 Yes Emokpae, Courage, MD  clopidogrel (PLAVIX) 75 MG tablet Take 1 tablet (75 mg total) by mouth daily. 07/03/19  Yes Emokpae, Courage, MD  gabapentin (NEURONTIN) 300 MG capsule Take 1 capsule (300 mg total) by mouth 3 (three) times daily. 07/03/19  Yes Emokpae, Courage, MD  insulin aspart (NOVOLOG) 100 UNIT/ML FlexPen Inject 0-10 Units into the skin See admin instructions. insulin aspart (novoLOG) injection 0-10 Units 0-10 Units Subcutaneous, 3 times daily with meals CBG < 70: Implement Hypoglycemia Standing Orders and refer to Hypoglycemia Standing Orders sidebar report  CBG 70 - 120: 0 unit CBG 121 - 150: 0 unit  CBG 151 - 200: 1 unit CBG 201 - 250: 2 units CBG 251 - 300: 4 units CBG 301 - 350: 6 units  CBG 351 - 400: 8 units  CBG > 400: 10 units 07/03/19  Yes Emokpae, Courage, MD  insulin glargine (LANTUS) 100 UNIT/ML injection Inject 0.1 mLs (10 Units total) into the skin at bedtime. Patient taking differently: Inject 20 Units into the skin at bedtime.  07/03/19  Yes Shon HaleEmokpae, Courage, MD  isosorbide mononitrate (IMDUR) 60 MG 24 hr tablet Take 1 tablet (60 mg  total) by mouth daily. 07/03/19  Yes Emokpae, Courage, MD  levETIRAcetam (KEPPRA) 500 MG tablet Take 1 tablet (500 mg total) by mouth 2 (two) times daily. 07/03/19  Yes Emokpae, Courage, MD  losartan (COZAAR) 50 MG tablet Take 1 tablet (50 mg total) by mouth daily. 07/03/19  Yes Emokpae, Courage, MD  nitroGLYCERIN (NITROSTAT) 0.4 MG SL tablet Place 1 tablet (0.4 mg total) under the tongue every 5 (five) minutes as needed for chest pain. 05/25/19  Yes Kroeger, Dot LanesKrista M., PA-C  Omega-3 1000 MG CAPS Take 1 capsule by mouth daily.   Yes [provider]  pantoprazole (PROTONIX) 40 MG tablet Take 1 tablet (40 mg total) by mouth daily. 07/04/19  Yes Emokpae, Courage, MD  sertraline (ZOLOFT) 50 MG tablet Take 1 tablet (50 mg total) by mouth daily. 07/03/19  Yes Shon HaleEmokpae, Courage, MD  sodium bicarbonate 650 MG  tablet Take 650 mg by mouth 2 (two) times daily. 07/31/19  Yes [provider]    Allergies    Penicillins  Review of Systems   Review of Systems  Constitutional: Negative for appetite change and fatigue.  HENT: Negative for congestion, ear discharge and sinus pressure.   Eyes: Negative for discharge.  Respiratory: Negative for cough.   Cardiovascular: Positive for syncope. Negative for chest pain.  Gastrointestinal: Negative for abdominal pain and diarrhea.  Genitourinary: Negative for frequency and hematuria.  Musculoskeletal: Negative for back pain.  Skin: Negative for rash.  Neurological: Positive for dizziness. Negative for seizures and headaches.  Psychiatric/Behavioral: Negative for hallucinations.    Physical Exam Updated Vital Signs BP 120/77    Pulse (!) 57    Temp 97.6 F (36.4 C) (Oral)    Resp 18    Ht 5\' 9"  (1.753 m)    Wt 77.6 kg    SpO2 93%    BMI 25.25 kg/m   Physical Exam Vitals and nursing note reviewed.  Constitutional:      Appearance: He is well-developed.  HENT:     Head: Normocephalic.     Nose: Nose normal.  Eyes:     General: No scleral icterus.     Conjunctiva/sclera: Conjunctivae normal.  Neck:     Thyroid: No thyromegaly.  Cardiovascular:     Rate and Rhythm: Normal rate and regular rhythm.     Heart sounds: No murmur heard.  No friction rub. No gallop.   Pulmonary:     Breath sounds: No stridor. No wheezing or rales.  Chest:     Chest wall: No tenderness.  Abdominal:     General: There is no distension.     Tenderness: There is no abdominal tenderness. There is no rebound.  Musculoskeletal:        General: Normal range of motion.     Cervical back: Neck supple.  Lymphadenopathy:     Cervical: No cervical adenopathy.  Skin:    Findings: No erythema or rash.  Neurological:     Mental Status: He is alert and oriented to person, place, and time.     Motor: No abnormal muscle tone.     Coordination: Coordination normal.  Psychiatric:        Behavior: Behavior normal.     ED Results / Procedures / Treatments   Labs (all labs ordered are listed, but only abnormal results are displayed) Labs Reviewed  BASIC METABOLIC PANEL - Abnormal; Notable for the following components:      Result Value   Potassium 5.4 (*)    Glucose, Bld 190 (*)    Creatinine, Ser 1.83 (*)    Calcium 8.4 (*)    GFR calc non Af Amer 43 (*)    GFR calc Af Amer 49 (*)    All other components within normal limits  BASIC METABOLIC PANEL - Abnormal; Notable for the following components:   Glucose, Bld 158 (*)    Creatinine, Ser 1.80 (*)    Calcium 7.9 (*)    GFR calc non Af Amer 44 (*)    GFR calc Af Amer 50 (*)    All other components within normal limits  CBG MONITORING, ED - Abnormal; Notable for the following components:   Glucose-Capillary 170 (*)    All other components within normal limits  CBC WITH DIFFERENTIAL/PLATELET  CK  TROPONIN I (HIGH SENSITIVITY)  TROPONIN I (HIGH SENSITIVITY)    EKG EKG Interpretation  Date/Time:  Saturday September 12 2019 15:10:08 EDT Ventricular Rate:  61 PR Interval:    QRS Duration: 87 QT  Interval:  462 QTC Calculation: 466 R Axis:   57 Text Interpretation: Sinus rhythm Anterolateral infarct, age indeterminate Abnormal T, consider ischemia, lateral leads Confirmed by Bethann Berkshire [phone]) on 09/12/2019 3:15:57 PM   Radiology CT Head Wo Contrast  Result Date: 09/12/2019 CLINICAL DATA:  Syncope with headache and some chest pain. Possible stroke. EXAM: CT HEAD WITHOUT CONTRAST TECHNIQUE: Contiguous axial images were obtained from the base of the skull through the vertex without intravenous contrast. COMPARISON:  07/01/2019 FINDINGS: Brain: Ventricles, cisterns and other CSF spaces are normal. There is no mass, mass effect, shift of midline structures or acute hemorrhage. No evidence of acute infarction. Vascular: No hyperdense vessel or unexpected calcification. Skull: Normal. Negative for fracture or focal lesion. Sinuses/Orbits: No acute finding. Other: None. IMPRESSION: No acute findings. Electronically Signed   By: Elberta Fortis M.D.   On: 09/12/2019 15:50   DG Chest Portable 1 View  Result Date: 09/12/2019 CLINICAL DATA:  Chest pain, syncope EXAM: PORTABLE CHEST 1 VIEW COMPARISON:  04/04/2019 FINDINGS: The heart size and mediastinal contours are within normal limits. Low lung volumes with streaky bibasilar opacities, left greater than right. No pleural effusion or pneumothorax is seen. The visualized skeletal structures are unremarkable. IMPRESSION: Low lung volumes with streaky bibasilar opacities, left greater than right, which may represent atelectasis versus developing infiltrates. Electronically Signed   By: Duanne Guess D.O.   On: 09/12/2019 15:25    Procedures Procedures (including critical care time)  Medications Ordered in ED Medications  sodium chloride 0.9 % bolus 1,000 mL (0 mLs Intravenous Stopped 09/12/19 1623)  levETIRAcetam (KEPPRA) IVPB 500 mg/100 mL premix (0 mg Intravenous Stopped 09/12/19 1646)  sodium chloride 0.9 % bolus 1,000 mL (1,000 mLs Intravenous  New Bag/Given 09/12/19 1639)    ED Course  I have reviewed the triage vital signs and the nursing notes.  Pertinent labs & imaging results that were available during my care of the patient were reviewed by me and considered in my medical decision making (see chart for details).    MDM Rules/Calculators/A&P                         Patient with syncopal episode.  Troponin x2 is negative.  Labs initially showed hyperkalemia but the repeat lab was normal potassium.  CT head negative.  Patient with syncopal episode and dehydration.  Patient improved with 2 L of fluids will be discharged home for follow-up with his PCP       This patient presents to the ED for concern of syncope this involves an extensive number of treatment options, and is a complaint that carries with it a high risk of complications and morbidity.  The differential diagnosis includes MI stroke dehydration   Lab Tests:   I Ordered, reviewed, and interpreted labs, which included CBC chemistries troponin.  Troponin was normal.  Potassium initially elevated  Medicines ordered:   I ordered medication normal saline for dehydration  Imaging Studies ordered:   I ordered imaging studies which included CT head and chest x-ray and  I independently visualized and interpreted imaging which showed CT of head unremarkable.  Mild streaky basilar opacities most likely atelectasis  Additional history obtained:   Additional history obtained from relative  Previous records obtained and reviewed   Consultations Obtained:   Reevaluation:  After the interventions stated above,  I reevaluated the patient and found improved  Critical Interventions:     Final Clinical Impression(s) / ED Diagnoses Final diagnoses:  Dehydration    Rx / DC Orders ED Discharge Orders    None       Bethann Berkshire, MD 09/12/19 1806

## 2019-09-12 NOTE — Discharge Instructions (Addendum)
Drink plenty of fluids and follow-up with your family doctor this week for recheck.  Rest over the weekend

## 2019-09-29 NOTE — Progress Notes (Signed)
Cardiology Office Note   Date:  09/30/2019   ID:  [name], DOB 11-Oct-1970, MRN [id number]  PCP:  Toma Deiters, MD  Cardiologist:   Rollene Rotunda, MD   Chief Complaint  Patient presents with  . Dizziness      History of Present Illness: [name] is a 49 y.o. male who presents for evaluation of CAD s/p BMS to mLAD 2010 with subsequent ISR managed with PCI/DES to LAD in 2018, chronic combined CHF and ischemic cardiomyopathy. He underwent a LHC which occurred 04/24/2019 and showed severe 2 vessel CAD with 85% mRCA stenosis managed with PCI/DES with 2nd stent placed upstream for catheter related dissection; he had 80% dLAD disease which was not amenable to stenting, and mild-moderate ISR on previous p-mLAD stent. He was recommended for aspirin and plavix as well as aggressive risk factor modifications for remaining disease. Echo 04/29/19 showed EF 35-40%, G1DD, and no significant valvular abnormalities.  He had hyperkalemia and had his Entresto stopped.  He had a follow up echo and had some improvement in his EF to 45%.  The apex is akinetic. He wore a monitor but unfortunately I do not have these results.  Thankfully since I last saw him he feels better. He is not having any of the dizziness that he was having. He is doing yard work. He thinks his breathing is okay. He has had no further seizures. He was in the hospital previously as previously mentioned and is still following with neurology. He denies any chest pressure, neck or arm discomfort. He has no new shortness of breath, PND or orthopnea. He has had no weight gain or edema.   Past Medical History:  Diagnosis Date  . Alcohol abuse   . CAD (coronary artery disease), native coronary artery    a. 01/2009 s/p Anterior MI and thrombectomy with BMS to mid LAD;  b. 06/2016 Cath/PCI: LM nl, LAD 73m/d, LCX nl, OM1/2/3 nl, RDA 31m. Initial attempt @ PCI failed followed by successful CTO LAD PCI and DES (2.5x38 Synergy)-->rec for  indefinte DAPT.  . CKD (chronic kidney disease), stage III   . Essential hypertension   . History of nonadherence to medical treatment   . Hyperlipidemia   . Ischemic cardiomyopathy    a. EF 40% by nuc in 2012;  b. 06/2016 Echo: EF 40-45%, apicalanteroseptal and apical AK, Gr2 DD, mod Ca2+ Ao annulus, mild MR.  [name] Slurred speech - Chronic   . Type 2 diabetes mellitus (HCC)     Past Surgical History:  Procedure Laterality Date  . CORONARY CTO INTERVENTION N/A 07/11/2016   Procedure: Coronary CTO Intervention;  Surgeon: Peter M Swaziland, MD;  Location: [facility] INVASIVE CV LAB;  Service: Cardiovascular;  Laterality: N/A;  . CORONARY STENT INTERVENTION N/A 07/09/2016   Procedure: Coronary Stent Intervention;  Surgeon: Runell Gess, MD;  Location: [facility] INVASIVE CV LAB;  Service: Cardiovascular;  Laterality: N/A;  . CORONARY STENT INTERVENTION N/A 04/24/2019   Procedure: CORONARY STENT INTERVENTION;  Surgeon: Marykay Lex, MD;  Location: [facility] INVASIVE CV LAB;  Service: Cardiovascular;  Laterality: N/A;  . Knee arthroscopic surgery Right   . LEFT HEART CATH AND CORONARY ANGIOGRAPHY N/A 07/05/2016   Procedure: Left Heart Cath and Coronary Angiography;  Surgeon: Lyn Records, MD;  Location: [facility] INVASIVE CV LAB;  Service: Cardiovascular;  Laterality: N/A;  . LEFT HEART CATH AND CORONARY ANGIOGRAPHY N/A 04/24/2019   Procedure: LEFT HEART CATH AND CORONARY ANGIOGRAPHY;  Surgeon: Bryan Lemma  W, MD;  Location: [facility] INVASIVE CV LAB;  Service: Cardiovascular;  Laterality: N/A;     Current Outpatient Medications  Medication Sig Dispense Refill  . aspirin 81 MG chewable tablet Chew 1 tablet (81 mg total) by mouth daily with breakfast. 120 tablet 2  . atorvastatin (LIPITOR) 80 MG tablet Take 1 tablet (80 mg total) by mouth daily. (Patient taking differently: Take 80 mg by mouth at bedtime. ) 30 tablet 11  . carvedilol (COREG) 3.125 MG tablet Take 1 tablet (3.125 mg total) by mouth 2 (two) times daily with a meal.  180 tablet 3  . clopidogrel (PLAVIX) 75 MG tablet Take 1 tablet (75 mg total) by mouth daily. 30 tablet 6  . gabapentin (NEURONTIN) 300 MG capsule Take 1 capsule (300 mg total) by mouth 3 (three) times daily. 90 capsule 3  . insulin aspart (NOVOLOG) 100 UNIT/ML FlexPen Inject 0-10 Units into the skin See admin instructions. insulin aspart (novoLOG) injection 0-10 Units 0-10 Units Subcutaneous, 3 times daily with meals CBG < 70: Implement Hypoglycemia Standing Orders and refer to Hypoglycemia Standing Orders sidebar report  CBG 70 - 120: 0 unit CBG 121 - 150: 0 unit  CBG 151 - 200: 1 unit CBG 201 - 250: 2 units CBG 251 - 300: 4 units CBG 301 - 350: 6 units  CBG 351 - 400: 8 units  CBG > 400: 10 units 15 mL 11  . insulin glargine (LANTUS) 100 UNIT/ML injection Inject 0.1 mLs (10 Units total) into the skin at bedtime. (Patient taking differently: Inject 20 Units into the skin at bedtime. ) 10 mL 11  . isosorbide mononitrate (IMDUR) 60 MG 24 hr tablet Take 1 tablet (60 mg total) by mouth daily. 90 tablet 3  . levETIRAcetam (KEPPRA) 500 MG tablet Take 1 tablet (500 mg total) by mouth 2 (two) times daily. 60 tablet 2  . nitroGLYCERIN (NITROSTAT) 0.4 MG SL tablet Place 1 tablet (0.4 mg total) under the tongue every 5 (five) minutes as needed for chest pain. 25 tablet 2  . Omega-3 1000 MG CAPS Take 1 capsule by mouth daily.    . pantoprazole (PROTONIX) 40 MG tablet Take 1 tablet (40 mg total) by mouth daily. 30 tablet 5  . sertraline (ZOLOFT) 50 MG tablet Take 1 tablet (50 mg total) by mouth daily. 30 tablet 5  . sodium bicarbonate 650 MG tablet Take 650 mg by mouth 2 (two) times daily.    . sacubitril-valsartan (ENTRESTO) 49-51 MG Take 1 tablet by mouth 2 (two) times daily. 60 tablet 6   No current facility-administered medications for this visit.    Allergies:   Penicillins    ROS:  Please see the history of present illness.   Otherwise, review of systems are positive for none.   All other systems are  reviewed and negative.    PHYSICAL EXAM: VS:  BP 132/90   Pulse 80   Ht 5\' 9"  (1.753 m)   Wt 172 lb (78 kg)   BMI 25.40 kg/m  , BMI Body mass index is 25.4 kg/m. GENERAL:  Well appearing NECK:  No jugular venous distention, waveform within normal limits, carotid upstroke brisk and symmetric, no bruits, no thyromegaly LUNGS:  Clear to auscultation bilaterally CHEST:  Unremarkable HEART:  PMI not displaced or sustained,S1 and S2 within normal limits, no S3, no S4, no clicks, no rubs, no murmurs ABD:  Flat, positive bowel sounds normal in frequency in pitch, no bruits, no rebound, no guarding, no  midline pulsatile mass, no hepatomegaly, no splenomegaly EXT:  2 plus pulses throughout, no edema, no cyanosis no clubbing  EKG:  EKG is not ordered today.    Recent Labs: 07/01/2019: Magnesium 1.5; TSH 1.972 07/03/2019: ALT 19 09/12/2019: BUN 18; Creatinine, Ser 1.80; Hemoglobin 14.9; Platelets 262; Potassium 4.9; Sodium 137    Lipid Panel    Component Value Date/Time   CHOL 146 07/27/2016 1039   TRIG 170 (H) 07/27/2016 1039   HDL 62 07/27/2016 1039   CHOLHDL 2.4 07/27/2016 1039   CHOLHDL 2.1 07/06/2016 0534   VLDL 19 07/06/2016 0534   LDLCALC 50 07/27/2016 1039      Wt Readings from Last 3 Encounters:  09/30/19 172 lb (78 kg)  09/12/19 171 lb (77.6 kg)  07/29/19 170 lb (77.1 kg)      Other studies Reviewed: Additional studies/ records that were reviewed today include: Labs Review of the above records demonstrates:  Please see elsewhere in the note.     ASSESSMENT AND PLAN:  CAD:      The patient has no new sypmtoms.  No further cardiovascular testing is indicated.  We will continue with aggressive risk reduction and meds as listed.  Hypertension: Blood pressure is at target but I am going to treat this also in the context of managing his cardiomyopathy.  Hyperlipidemia:    Total cholesterol is 94. This was in May. No change in therapy.  DM II:     Last A1c that I can  see was 8.0 in April. I will defer toHasanaj, Myra Gianotti, MD.  I will suggest consideration of a SGLT2i or GLP1ra but will defer to the PCP.  CKD stage III:   Creatinine was most recently 1.80 which was lower than previous. His potassium has normalized.  Ischemic cardiomyopathy:  His ejection fraction was improved to 45%. I would like again to try the [facility] since he is tolerating Cozaar. I would be 49/52. However, we will keep a very close eye on his potassium. I have ordered potassium in 1 week after starting the [facility] and then 2 weeks after that.  Dizziness:   He is no longer having dizziness. I will follow up with the results of the monitor.  Covid education: He had a long discussion about this. He and his wife think they will both get the vaccine.  Current medicines are reviewed at length with the patient today.  The patient does not have concerns regarding medicines.  The following changes have been made:   As above.   Labs/ tests ordered today include:    Orders Placed This Encounter  Procedures  . Basic metabolic panel  . Basic metabolic panel     Disposition:   FU with me in 2 months.     Signed, Rollene Rotunda, MD  09/30/2019 3:45 PM    [facility]

## 2019-09-30 ENCOUNTER — Encounter: Payer: Self-pay | Admitting: Cardiology

## 2019-09-30 ENCOUNTER — Other Ambulatory Visit: Payer: Self-pay

## 2019-09-30 ENCOUNTER — Ambulatory Visit: Payer: Medicaid Other | Admitting: Cardiology

## 2019-09-30 VITALS — BP 132/90 | HR 80 | Ht 69.0 in | Wt 172.0 lb

## 2019-09-30 DIAGNOSIS — Z7189 Other specified counseling: Secondary | ICD-10-CM | POA: Diagnosis not present

## 2019-09-30 DIAGNOSIS — I25110 Atherosclerotic heart disease of native coronary artery with unstable angina pectoris: Secondary | ICD-10-CM | POA: Diagnosis not present

## 2019-09-30 DIAGNOSIS — E118 Type 2 diabetes mellitus with unspecified complications: Secondary | ICD-10-CM

## 2019-09-30 DIAGNOSIS — I10 Essential (primary) hypertension: Secondary | ICD-10-CM

## 2019-09-30 DIAGNOSIS — Z79899 Other long term (current) drug therapy: Secondary | ICD-10-CM | POA: Diagnosis not present

## 2019-09-30 DIAGNOSIS — I5042 Chronic combined systolic (congestive) and diastolic (congestive) heart failure: Secondary | ICD-10-CM

## 2019-09-30 MED ORDER — ENTRESTO 49-51 MG PO TABS: 1.0000 | ORAL_TABLET | Freq: Two times a day (BID) | ORAL | 6 refills | Status: DC

## 2019-09-30 NOTE — Patient Instructions (Addendum)
Medication Instructions:  Please discontinue your Losartan.   Start Entresto 49-51 mg (1) twice a day only after you have stopped taking the Losartan for 48 hours. Continue all other medications as listed.  *If you need a refill on your cardiac medications before your next appointment, please call your pharmacy*  Lab Work: Please have blood work 1 week after starting Sherryll Burger and then 2 weeks after that (BMP).  If you have labs (blood work) drawn today and your tests are completely normal, you will receive your results only by: [name] MyChart Message (if you have MyChart) OR . A paper copy in the mail If you have any lab test that is abnormal or we need to change your treatment, we will call you to review the results.  Follow-Up: At [facility], you and your health needs are our priority.  As part of our continuing mission to provide you with exceptional heart care, we have created designated Provider Care Teams.  These Care Teams include your primary Cardiologist (physician) and Advanced Practice Providers (APPs -  Physician Assistants and Nurse Practitioners) who all work together to provide you with the care you need, when you need it.  We recommend signing up for the patient portal called "MyChart".  Sign up information is provided on this After Visit Summary.  MyChart is used to connect with patients for Virtual Visits (Telemedicine).  Patients are able to view lab/test results, encounter notes, upcoming appointments, etc.  Non-urgent messages can be sent to your provider as well.   To learn more about what you can do with MyChart, go to [email or web address].    Your next appointment:   2 month(s)  The format for your next appointment:   In Person  Provider:   Rollene Rotunda, MD   Thank you for choosing [facility]!!

## 2019-10-01 ENCOUNTER — Telehealth: Payer: Self-pay

## 2019-10-01 NOTE — Telephone Encounter (Signed)
**Note De-Identified  Obfuscation** I did an [place] PA through covermymeds and received the following message:  [name] [name] - PA Case ID: [id number] Outcome  Approved today  PA Case: [id number] Coverage Starts on: 10/01/2019 12:00:00 AM, Coverage Ends on: 09/30/2020 12:00:00 AM.  Drug Entresto 49-51MG  tablets  [employer] Electronic PA Form [phone] NCPDP)   I have notified [facility] and the pts wife [name] (DPR) of this approval.

## 2019-10-07 ENCOUNTER — Other Ambulatory Visit: Payer: Self-pay

## 2019-10-07 ENCOUNTER — Telehealth: Payer: Self-pay | Admitting: Cardiology

## 2019-10-07 DIAGNOSIS — I5042 Chronic combined systolic (congestive) and diastolic (congestive) heart failure: Secondary | ICD-10-CM

## 2019-10-07 DIAGNOSIS — Z79899 Other long term (current) drug therapy: Secondary | ICD-10-CM

## 2019-10-07 NOTE — Telephone Encounter (Signed)
[name] is calling stating [name] went [facility] today for labs as Dr. Antoine Poche advised and they stated they had no record of it. Please advise.

## 2019-10-07 NOTE — Telephone Encounter (Signed)
Spoke with the patients wife, [name]. She states that the patient went to [facility] but they did not have orders to draw labs. I released lab orders and called [facility] to confirm. Kathlee Nations that the patient can now go back and get labs drawn. They verbalized understanding.

## 2019-10-08 ENCOUNTER — Telehealth: Payer: Self-pay | Admitting: Cardiology

## 2019-10-08 ENCOUNTER — Other Ambulatory Visit: Payer: Self-pay | Admitting: *Deleted

## 2019-10-08 DIAGNOSIS — I5042 Chronic combined systolic (congestive) and diastolic (congestive) heart failure: Secondary | ICD-10-CM

## 2019-10-08 DIAGNOSIS — Z79899 Other long term (current) drug therapy: Secondary | ICD-10-CM

## 2019-10-08 DIAGNOSIS — I25110 Atherosclerotic heart disease of native coronary artery with unstable angina pectoris: Secondary | ICD-10-CM

## 2019-10-08 NOTE — Telephone Encounter (Signed)
[facility] in [place] called stating the patient is there for lab work, but they do not have orders. Informed her of the note below that lab orders were given to [facility] in [place]. She stated she would inform the patient.

## 2019-10-08 NOTE — Telephone Encounter (Signed)
New Message:    [name] called and said she took patient again today to get lab work, they would not do it.

## 2019-10-08 NOTE — Telephone Encounter (Signed)
Returned call to patient, patient states they went for lab work today again and they were not able to get it done.    They did not have orders.      [facility] yesterday when patient was in lab and nurse informed them the order was for [facility] and patient was made aware.   He states he went back to the same location [facility]) today.     Apologized for the confusion/frustrations.  Changed order to [facility] and advised if they get to the lab and they still do not have orders, do not leave, have [facility] call our office.    Patient verbalized understanding

## 2019-10-13 DIAGNOSIS — E1143 Type 2 diabetes mellitus with diabetic autonomic (poly)neuropathy: Secondary | ICD-10-CM | POA: Diagnosis not present

## 2019-10-13 DIAGNOSIS — G40909 Epilepsy, unspecified, not intractable, without status epilepticus: Secondary | ICD-10-CM | POA: Diagnosis not present

## 2019-10-13 DIAGNOSIS — I10 Essential (primary) hypertension: Secondary | ICD-10-CM | POA: Diagnosis not present

## 2019-10-13 DIAGNOSIS — I2510 Atherosclerotic heart disease of native coronary artery without angina pectoris: Secondary | ICD-10-CM | POA: Diagnosis not present

## 2019-10-13 DIAGNOSIS — L0212 Furuncle of neck: Secondary | ICD-10-CM | POA: Diagnosis not present

## 2019-10-22 DIAGNOSIS — E1143 Type 2 diabetes mellitus with diabetic autonomic (poly)neuropathy: Secondary | ICD-10-CM | POA: Diagnosis not present

## 2019-10-22 DIAGNOSIS — G40909 Epilepsy, unspecified, not intractable, without status epilepticus: Secondary | ICD-10-CM | POA: Diagnosis not present

## 2019-10-22 DIAGNOSIS — I10 Essential (primary) hypertension: Secondary | ICD-10-CM | POA: Diagnosis not present

## 2019-10-22 DIAGNOSIS — I2510 Atherosclerotic heart disease of native coronary artery without angina pectoris: Secondary | ICD-10-CM | POA: Diagnosis not present

## 2019-10-22 DIAGNOSIS — L0212 Furuncle of neck: Secondary | ICD-10-CM | POA: Diagnosis not present

## 2019-11-09 ENCOUNTER — Other Ambulatory Visit (HOSPITAL_COMMUNITY): Payer: Medicaid Other

## 2019-11-10 ENCOUNTER — Other Ambulatory Visit: Payer: Self-pay

## 2019-11-10 ENCOUNTER — Encounter: Payer: Self-pay | Admitting: Neurology

## 2019-11-10 ENCOUNTER — Ambulatory Visit (INDEPENDENT_AMBULATORY_CARE_PROVIDER_SITE_OTHER): Payer: Medicaid Other | Admitting: Neurology

## 2019-11-10 VITALS — BP 146/92 | HR 89 | Ht 69.0 in | Wt 163.0 lb

## 2019-11-10 DIAGNOSIS — G40009 Localization-related (focal) (partial) idiopathic epilepsy and epileptic syndromes with seizures of localized onset, not intractable, without status epilepticus: Secondary | ICD-10-CM

## 2019-11-10 MED ORDER — OXCARBAZEPINE 300 MG PO TABS: ORAL_TABLET | ORAL | 5 refills | Status: DC

## 2019-11-10 MED ORDER — LEVETIRACETAM 500 MG PO TABS: 500.0000 mg | ORAL_TABLET | Freq: Two times a day (BID) | ORAL | 2 refills | Status: DC
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
## 2019-11-10 NOTE — Patient Instructions (Signed)
1. Continue Keppra 500mg  twice a day. After starting your new seizure medication, continue taking it twice a day until you are on oxcarbazepine 2 tablets twice a day. At that point, reduce Keppra to 1 tablet daily for 1 week, then stop  2. Once you get back from vacation, start the oxcarbazepine 300mg : take 1/2 tablet twice a day for 1 week, then increase to 1 tablet twice a day for 1 week, then increase to 2 tablets twice a day and continue  3. Keep a calendar of the seizures  4. Follow-up in 3 months, call for any changes  Seizure Precautions: 1. If medication has been prescribed for you to prevent seizures, take it exactly as directed.  Do not stop taking the medicine without talking to your doctor first, even if you have not had a seizure in a long time.   2. Avoid activities in which a seizure would cause danger to yourself or to others.  Don't operate dangerous machinery, swim alone, or climb in high or dangerous places, such as on ladders, roofs, or girders.  Do not drive unless your doctor says you may.  3. If you have any warning that you may have a seizure, lay down in a safe place where you can't hurt yourself.    4.  No driving for 6 months from last seizure, as per [facility]   Please refer to the following link on the Epilepsy Foundation of America's website for more information: http://www.epilepsyfoundation.org/answerplace/Social/driving/drivingu.cfm   5.  Maintain good sleep hygiene. Avoid alcohol.  6.  Contact your doctor if you have any problems that may be related to the medicine you are taking.  7.  Call 911 and bring the patient back to the ED if:        A.  The seizure lasts longer than 5 minutes.       B.  The patient doesn't awaken shortly after the seizure  C.  The patient has new problems such as difficulty seeing, speaking or moving  D.  The patient was injured during the seizure  E.  The patient has a temperature over 102 F (39C)  F.  The  patient vomited and now is having trouble breathing

## 2019-12-01 DIAGNOSIS — R42 Dizziness and giddiness: Secondary | ICD-10-CM | POA: Insufficient documentation

## 2019-12-01 NOTE — Progress Notes (Deleted)
Cardiology Office Note   Date:  12/01/2019   ID:  [name], DOB 01/23/1971, MRN [id number]  PCP:  Toma Deiters, MD  Cardiologist:   Rollene Rotunda, MD   No chief complaint on file.     History of Present Illness: [name] is a 49 y.o. male who presents for evaluation of CAD s/p BMS to mLAD 2010 with subsequent ISR managed with PCI/DES to LAD in 2018, chronic combined CHF and ischemic cardiomyopathy. He underwent a LHC which occurred 04/24/2019 and showed severe 2 vessel CAD with 85% mRCA stenosis managed with PCI/DES with 2nd stent placed upstream for catheter related dissection; he had 80% dLAD disease which was not amenable to stenting, and mild-moderate ISR on previous p-mLAD stent. He was recommended for aspirin and plavix as well as aggressive risk factor modifications for remaining disease. Echo 04/29/19 showed EF 35-40%, G1DD, and no significant valvular abnormalities.  He had hyperkalemia and had his Entresto stopped.  He had a follow up echo and had some improvement in his EF to 45%.  The apex is akinetic. He wore a monitor and had no significant arrhythmias with 8 beat NSVT.  ***   ***but unfortunately I do not have these results.  Thankfully since I last saw him he feels better. He is not having any of the dizziness that he was having. He is doing yard work. He thinks his breathing is okay. He has had no further seizures. He was in the hospital previously as previously mentioned and is still following with neurology. He denies any chest pressure, neck or arm discomfort. He has no new shortness of breath, PND or orthopnea. He has had no weight gain or edema.   Past Medical History:  Diagnosis Date  . Alcohol abuse   . CAD (coronary artery disease), native coronary artery    a. 01/2009 s/p Anterior MI and thrombectomy with BMS to mid LAD;  b. 06/2016 Cath/PCI: LM nl, LAD 81m/d, LCX nl, OM1/2/3 nl, RDA 82m. Initial attempt @ PCI failed followed by successful CTO LAD PCI  and DES (2.5x38 Synergy)-->rec for indefinte DAPT.  . CKD (chronic kidney disease), stage III   . Essential hypertension   . History of nonadherence to medical treatment   . Hyperlipidemia   . Ischemic cardiomyopathy    a. EF 40% by nuc in 2012;  b. 06/2016 Echo: EF 40-45%, apicalanteroseptal and apical AK, Gr2 DD, mod Ca2+ Ao annulus, mild MR.  [name] Slurred speech - Chronic   . Type 2 diabetes mellitus (HCC)     Past Surgical History:  Procedure Laterality Date  . CORONARY CTO INTERVENTION N/A 07/11/2016   Procedure: Coronary CTO Intervention;  Surgeon: Peter M Swaziland, MD;  Location: [facility] INVASIVE CV LAB;  Service: Cardiovascular;  Laterality: N/A;  . CORONARY STENT INTERVENTION N/A 07/09/2016   Procedure: Coronary Stent Intervention;  Surgeon: Runell Gess, MD;  Location: [facility] INVASIVE CV LAB;  Service: Cardiovascular;  Laterality: N/A;  . CORONARY STENT INTERVENTION N/A 04/24/2019   Procedure: CORONARY STENT INTERVENTION;  Surgeon: Marykay Lex, MD;  Location: [facility] INVASIVE CV LAB;  Service: Cardiovascular;  Laterality: N/A;  . Knee arthroscopic surgery Right   . LEFT HEART CATH AND CORONARY ANGIOGRAPHY N/A 07/05/2016   Procedure: Left Heart Cath and Coronary Angiography;  Surgeon: Lyn Records, MD;  Location: [facility] INVASIVE CV LAB;  Service: Cardiovascular;  Laterality: N/A;  . LEFT HEART CATH AND CORONARY ANGIOGRAPHY N/A 04/24/2019   Procedure: LEFT HEART  CATH AND CORONARY ANGIOGRAPHY;  Surgeon: Marykay Lex, MD;  Location: [facility] INVASIVE CV LAB;  Service: Cardiovascular;  Laterality: N/A;     Current Outpatient Medications  Medication Sig Dispense Refill  . aspirin 81 MG chewable tablet Chew 1 tablet (81 mg total) by mouth daily with breakfast. 120 tablet 2  . atorvastatin (LIPITOR) 80 MG tablet Take 1 tablet (80 mg total) by mouth daily. (Patient taking differently: Take 80 mg by mouth at bedtime. ) 30 tablet 11  . carvedilol (COREG) 3.125 MG tablet Take 1 tablet (3.125 mg total) by  mouth 2 (two) times daily with a meal. 180 tablet 3  . clopidogrel (PLAVIX) 75 MG tablet Take 1 tablet (75 mg total) by mouth daily. 30 tablet 6  . gabapentin (NEURONTIN) 300 MG capsule Take 1 capsule (300 mg total) by mouth 3 (three) times daily. 90 capsule 3  . insulin aspart (NOVOLOG) 100 UNIT/ML FlexPen Inject 0-10 Units into the skin See admin instructions. insulin aspart (novoLOG) injection 0-10 Units 0-10 Units Subcutaneous, 3 times daily with meals CBG < 70: Implement Hypoglycemia Standing Orders and refer to Hypoglycemia Standing Orders sidebar report  CBG 70 - 120: 0 unit CBG 121 - 150: 0 unit  CBG 151 - 200: 1 unit CBG 201 - 250: 2 units CBG 251 - 300: 4 units CBG 301 - 350: 6 units  CBG 351 - 400: 8 units  CBG > 400: 10 units 15 mL 11  . insulin glargine (LANTUS) 100 UNIT/ML injection Inject 0.1 mLs (10 Units total) into the skin at bedtime. (Patient taking differently: Inject 20 Units into the skin at bedtime. ) 10 mL 11  . isosorbide mononitrate (IMDUR) 60 MG 24 hr tablet Take 1 tablet (60 mg total) by mouth daily. 90 tablet 3  . levETIRAcetam (KEPPRA) 500 MG tablet Take 1 tablet (500 mg total) by mouth 2 (two) times daily. 60 tablet 2  . nitroGLYCERIN (NITROSTAT) 0.4 MG SL tablet Place 1 tablet (0.4 mg total) under the tongue every 5 (five) minutes as needed for chest pain. 25 tablet 2  . Omega-3 1000 MG CAPS Take 2 capsules by mouth daily.     . Oxcarbazepine (TRILEPTAL) 300 MG tablet Take 1/2 tablet twice a day for 1 week, then increase to 1 tablet twice a day for 1 week, then increase to 2 tablets twice a day and continue 120 tablet 5  . pantoprazole (PROTONIX) 40 MG tablet Take 1 tablet (40 mg total) by mouth daily. 30 tablet 5  . sacubitril-valsartan (ENTRESTO) 49-51 MG Take 1 tablet by mouth 2 (two) times daily. 60 tablet 6  . sertraline (ZOLOFT) 50 MG tablet Take 1 tablet (50 mg total) by mouth daily. 30 tablet 5  . sodium bicarbonate 650 MG tablet Take 650 mg by mouth 2 (two)  times daily.     No current facility-administered medications for this visit.    Allergies:   Penicillins    ROS:  Please see the history of present illness.   Otherwise, review of systems are positive for ***.   All other systems are reviewed and negative.    PHYSICAL EXAM: VS:  There were no vitals taken for this visit. , BMI There is no height or weight on file to calculate BMI. GENERAL:  Well appearing NECK:  No jugular venous distention, waveform within normal limits, carotid upstroke brisk and symmetric, no bruits, no thyromegaly LUNGS:  Clear to auscultation bilaterally CHEST:  Unremarkable HEART:  PMI not  displaced or sustained,S1 and S2 within normal limits, no S3, no S4, no clicks, no rubs, *** murmurs ABD:  Flat, positive bowel sounds normal in frequency in pitch, no bruits, no rebound, no guarding, no midline pulsatile mass, no hepatomegaly, no splenomegaly EXT:  2 plus pulses throughout, no edema, no cyanosis no clubbing    ***GENERAL:  Well appearing NECK:  No jugular venous distention, waveform within normal limits, carotid upstroke brisk and symmetric, no bruits, no thyromegaly LUNGS:  Clear to auscultation bilaterally CHEST:  Unremarkable HEART:  PMI not displaced or sustained,S1 and S2 within normal limits, no S3, no S4, no clicks, no rubs, no murmurs ABD:  Flat, positive bowel sounds normal in frequency in pitch, no bruits, no rebound, no guarding, no midline pulsatile mass, no hepatomegaly, no splenomegaly EXT:  2 plus pulses throughout, no edema, no cyanosis no clubbing   EKG:  EKG is ***ordered today.    Recent Labs: 07/01/2019: Magnesium 1.5; TSH 1.972 07/03/2019: ALT 19 09/12/2019: BUN 18; Creatinine, Ser 1.80; Hemoglobin 14.9; Platelets 262; Potassium 4.9; Sodium 137    Lipid Panel    Component Value Date/Time   CHOL 146 07/27/2016 1039   TRIG 170 (H) 07/27/2016 1039   HDL 62 07/27/2016 1039   CHOLHDL 2.4 07/27/2016 1039   CHOLHDL 2.1 07/06/2016  0534   VLDL 19 07/06/2016 0534   LDLCALC 50 07/27/2016 1039      Wt Readings from Last 3 Encounters:  11/10/19 163 lb (73.9 kg)  09/30/19 172 lb (78 kg)  09/12/19 171 lb (77.6 kg)      Other studies Reviewed: Additional studies/ records that were reviewed today include: *** Review of the above records demonstrates:  Please see elsewhere in the note.     ASSESSMENT AND PLAN:  CAD:  ***    The patient has no new sypmtoms.  No further cardiovascular testing is indicated.  We will continue with aggressive risk reduction and meds as listed.  Hypertension: Blood pressure is *** at target but I am going to treat this also in the context of managing his cardiomyopathy.  Hyperlipidemia:    Total cholesterol is *** 94. This was in May. No change in therapy.  DM II:     Last A1c that I can see was *** 8.0 in April. I will defer toHasanaj, Myra GianottiXaje A, MD.  I will suggest consideration of a SGLT2i or GLP1ra but will defer to the PCP.  CKD stage III:   Creatinine was *** most recently 1.80 which was lower than previous. His potassium has normalized.  Ischemic cardiomyopathy:  His ejection fraction was improved to 45% on echo in May.  ***  I would like again to try the [facility] since he is tolerating Cozaar. I would be 49/52. However, we will keep a very close eye on his potassium. I have ordered potassium in 1 week after starting the [facility] and then 2 weeks after that.  Dizziness:   ***  He is no longer having dizziness. I will follow up with the results of the monitor.  Covid education:   ***  e had a long discussion about this. He and his wife think they will both get the vaccine.  Current medicines are reviewed at length with the patient today.  The patient does not have concerns regarding medicines.  The following changes have been made:   ***  Labs/ tests ordered today include:  ***  No orders of the defined types were placed in this encounter.  Disposition:   FU with me in ***  months.     Signed, Rollene Rotunda, MD  12/01/2019 9:32 PM    [facility]

## 2019-12-02 ENCOUNTER — Ambulatory Visit: Payer: Medicaid Other | Admitting: Cardiology

## 2020-02-11 ENCOUNTER — Ambulatory Visit: Payer: Medicaid Other | Admitting: Neurology

## 2020-04-20 ENCOUNTER — Ambulatory Visit: Payer: Medicaid Other | Admitting: Cardiology

## 2020-04-22 ENCOUNTER — Telehealth: Payer: Self-pay | Admitting: Cardiology

## 2020-04-22 MED ORDER — NITROGLYCERIN 0.4 MG SL SUBL: 0.4000 mg | SUBLINGUAL_TABLET | SUBLINGUAL | 2 refills | Status: DC | PRN

## 2020-04-22 NOTE — Telephone Encounter (Signed)
Pt's son is calling stating [name] thinks his stents are stopping back up. He has been rescheduled for the first available appointment with [name] on 05/10/20 for a 2 month f/u as well as to discuss this. Pt's son requested [name] his mother/pt's wife be called back in regards to this instead of him. Please advise.

## 2020-04-22 NOTE — Telephone Encounter (Signed)
Spoke with patient's wife. Asked if patient is currently having chest pain. She reports patient is not, last time it happened was a few days ago. Cannot give description of pain or symptoms. States patient is hard headed and doesn't tell anyone when he's hurting. She reports his nitroglycerin isn't helping but that it may be old. Patient has an appointment with [name] on 05/10/20. Nitroglycerine refilled. Informed spouse if chest pain returns and is unrelieved by nitroglycerine to report to the local ER immediately. Spouse verbalized understanding.

## 2020-05-02 ENCOUNTER — Ambulatory Visit: Payer: Medicaid Other | Admitting: Podiatry

## 2020-05-02 ENCOUNTER — Other Ambulatory Visit: Payer: Self-pay

## 2020-05-02 ENCOUNTER — Encounter: Payer: Self-pay | Admitting: Podiatry

## 2020-05-02 DIAGNOSIS — L600 Ingrowing nail: Secondary | ICD-10-CM

## 2020-05-02 NOTE — Patient Instructions (Signed)

## 2020-05-03 NOTE — Progress Notes (Signed)
Subjective:   Patient ID: [name], male   DOB: 50 y.o.   MRN: [id number]   HPI Patient presents with a damaged right hallux nail that is been removed several times before and is grown back and it is painful when pressed.  He cannot trim it himself and has caregivers with him today.  Patient does not smoke and is not active   Review of Systems  All other systems reviewed and are negative.       Objective:  Physical Exam Vitals and nursing note reviewed.  Constitutional:      Appearance: He is well-developed and well-nourished.  Cardiovascular:     Pulses: Intact distal pulses.  Pulmonary:     Effort: Pulmonary effort is normal.  Musculoskeletal:        General: Normal range of motion.  Skin:    General: Skin is warm.  Neurological:     Mental Status: He is alert.     Neurovascular status intact muscle strength found to be adequate range of motion within normal limits.  Patient is found to have a thickened dystrophic painful hallux nail right that is painful when pressed to make shoe gear difficult.  Patient is found to have good digital perfusion well oriented x3     Assessment:  Nail disease of the right hallux with structural changes and pain     Plan:  H&P reviewed condition recommended nail removal.  I explained this to the patient caregiver and procedure and they signed consent form understanding risk and today I infiltrated the right hallux 60 mg like Marcaine mixture sterile prep done and using sterile instrumentation remove the hallux nail exposed matrix applied phenol for applications 30 seconds followed by alcohol lavage sterile dressing gave instructions on soaks and reappoint

## 2020-05-10 ENCOUNTER — Ambulatory Visit (INDEPENDENT_AMBULATORY_CARE_PROVIDER_SITE_OTHER): Payer: Medicaid Other | Admitting: Cardiology

## 2020-05-10 ENCOUNTER — Encounter: Payer: Self-pay | Admitting: Cardiology

## 2020-05-10 ENCOUNTER — Other Ambulatory Visit: Payer: Self-pay

## 2020-05-10 VITALS — BP 140/74 | HR 86 | Ht 67.0 in | Wt 159.0 lb

## 2020-05-10 DIAGNOSIS — R072 Precordial pain: Secondary | ICD-10-CM | POA: Diagnosis not present

## 2020-05-10 DIAGNOSIS — Z794 Long term (current) use of insulin: Secondary | ICD-10-CM

## 2020-05-10 DIAGNOSIS — R079 Chest pain, unspecified: Secondary | ICD-10-CM

## 2020-05-10 DIAGNOSIS — E118 Type 2 diabetes mellitus with unspecified complications: Secondary | ICD-10-CM | POA: Diagnosis not present

## 2020-05-10 DIAGNOSIS — N183 Chronic kidney disease, stage 3 unspecified: Secondary | ICD-10-CM

## 2020-05-10 DIAGNOSIS — Z9861 Coronary angioplasty status: Secondary | ICD-10-CM | POA: Diagnosis not present

## 2020-05-10 DIAGNOSIS — Z79899 Other long term (current) drug therapy: Secondary | ICD-10-CM | POA: Diagnosis not present

## 2020-05-10 DIAGNOSIS — I255 Ischemic cardiomyopathy: Secondary | ICD-10-CM | POA: Diagnosis not present

## 2020-05-10 DIAGNOSIS — R569 Unspecified convulsions: Secondary | ICD-10-CM | POA: Diagnosis not present

## 2020-05-10 DIAGNOSIS — I2510 Atherosclerotic heart disease of native coronary artery without angina pectoris: Secondary | ICD-10-CM

## 2020-05-10 DIAGNOSIS — E875 Hyperkalemia: Secondary | ICD-10-CM | POA: Diagnosis not present

## 2020-05-10 LAB — BASIC METABOLIC PANEL
BUN/Creatinine Ratio: 7 — ABNORMAL LOW (ref 9–20)
BUN: 13 mg/dL (ref 6–24)
CO2: 22 mmol/L (ref 20–29)
Calcium: 9 mg/dL (ref 8.7–10.2)
Chloride: 98 mmol/L (ref 96–106)
Creatinine, Ser: 1.77 mg/dL — ABNORMAL HIGH (ref 0.76–1.27)
GFR calc Af Amer: 51 mL/min/{1.73_m2} — ABNORMAL LOW (ref >59)
GFR calc non Af Amer: 44 mL/min/{1.73_m2} — ABNORMAL LOW (ref >59)
Glucose: 398 mg/dL — ABNORMAL HIGH (ref 65–99)
Potassium: 5.7 mmol/L — ABNORMAL HIGH (ref 3.5–5.2)
Sodium: 136 mmol/L (ref 134–144)

## 2020-05-10 NOTE — Assessment & Plan Note (Signed)
Check lexiscan

## 2020-05-10 NOTE — Addendum Note (Signed)
Addended by: Barrie Dunker on: 05/10/2020 10:41 AM   Modules accepted: Orders

## 2020-05-10 NOTE — Addendum Note (Signed)
Addended by: Abelino Derrick on: 05/10/2020 02:59 PM   Modules accepted: Orders

## 2020-05-10 NOTE — Assessment & Plan Note (Signed)
F/U with [facility] about his medications

## 2020-05-10 NOTE — Patient Instructions (Signed)
Medication Instructions:  Continue current medications  *If you need a refill on your cardiac medications before your next appointment, please call your pharmacy*   Lab Work: BMP If you have labs (blood work) drawn today and your tests are completely normal, you will receive your results only by: [name] MyChart Message (if you have MyChart) OR . A paper copy in the mail If you have any lab test that is abnormal or we need to change your treatment, we will call you to review the results.   Testing/Procedures: Your physician has requested that you have a lexiscan myoview. For further information please visit [email or web address] Please follow instruction sheet, as given.  Follow-Up: At [facility], you and your health needs are our priority.  As part of our continuing mission to provide you with exceptional heart care, we have created designated Provider Care Teams.  These Care Teams include your primary Cardiologist (physician) and Advanced Practice Providers (APPs -  Physician Assistants and Nurse Practitioners) who all work together to provide you with the care you need, when you need it.  We recommend signing up for the patient portal called "MyChart".  Sign up information is provided on this After Visit Summary.  MyChart is used to connect with patients for Virtual Visits (Telemedicine).  Patients are able to view lab/test results, encounter notes, upcoming appointments, etc.  Non-urgent messages can be sent to your provider as well.   To learn more about what you can do with MyChart, go to [email or web address].    Your next appointment:   2-3 week(s)  The format for your next appointment:   In Person  Provider:   You may see Rollene Rotunda, MD or one of the following Advanced Practice Providers on your designated Care Team:    Theodore Demark, PA-C  Joni Reining, DNP, ANP    Other Instructions You have been referred to [facility] Address: [address]  [place], [place] [address] Phone: [phone]  Dr Caryl Ada Neurologist Phone: [phone] Fax: [phone]  [address] [place] [address]

## 2020-05-10 NOTE — Assessment & Plan Note (Signed)
Entresto resumed July 2021- check BMP as he has had hyperkalemia in the past

## 2020-05-10 NOTE — Assessment & Plan Note (Signed)
Refer to [facility] for CRI 3 with IDDM.

## 2020-05-10 NOTE — Assessment & Plan Note (Signed)
Referred to his new PCP to manage

## 2020-05-10 NOTE — Progress Notes (Signed)
Cardiology Office Note:    Date:  05/10/2020   ID:  [name], DOB 1970/09/01, MRN [id number]  PCP:  Toma Deiters, MD  Cardiologist:  Rollene Rotunda, MD  Electrophysiologist:  None   Referring MD: Toma Deiters, MD   CC: chest pain  History of Present Illness:    [name] is a 50 y.o. male with a hx of coronary disease.  He had an LAD BMS in 2010.  He had in-stent restenosis in 2018 treated with PCI.  Catheterization in January 2021 showed a patent LAD stent with an 80% distal LAD.  He had a 70% proximal RCA and an 85% mid RCA, the RCA was treated with PCI and DES.  Patient also has several other medical issues.  He has insulin-dependent diabetes.  He has treated dyslipidemia.  He has chronic renal insufficiency 3 with a GFR 44.  There is a history of seizures.  He has chronic slurred speech and there appears to be some cognitive to deficit.  His last ejection fraction was 45% by echocardiogram May 2021.  In the past he has had problems with hyperkalemia.  He was on Entresto at one point and this was stopped.  Dr. Antoine Poche resume this when he saw him in July since the patient was tolerating losartan.  Patient presents to the office today accompanied by a family member.  They have several complaints and issues.  They say that the patient has had some chest pain.  The episodes sound like they are not exertional.  The patient describes them as sharp and localized and lasting seconds.  There are also concerned about fatigue and sleepiness.  The family decided to take him off his Keppra.  They are also wondering about other medicines they could stop to reduce his sleepiness.  Patient has been evaluated by [facility] neurology, they saw him last in August 2021.  They also tell me he was previously referred to a nephrologist but they never received any information on that.  In addition they also say that Dr. Antoine Poche told them to the patient could drink a beer a day to stay hydrated, I could find  no documentation of that recommendation.  The patient tells me he is not drinking any alcohol now.  The family also tells me they have decided to switch primary care providers to [facility].  They have not actually seen them yet.  Past Medical History:  Diagnosis Date  . Alcohol abuse   . CAD (coronary artery disease), native coronary artery    a. 01/2009 s/p Anterior MI and thrombectomy with BMS to mid LAD;  b. 06/2016 Cath/PCI: LM nl, LAD 40m/d, LCX nl, OM1/2/3 nl, RDA 86m. Initial attempt @ PCI failed followed by successful CTO LAD PCI and DES (2.5x38 Synergy)-->rec for indefinte DAPT.  . CKD (chronic kidney disease), stage III (HCC)   . Essential hypertension   . History of nonadherence to medical treatment   . Hyperlipidemia   . Ischemic cardiomyopathy    a. EF 40% by nuc in 2012;  b. 06/2016 Echo: EF 40-45%, apicalanteroseptal and apical AK, Gr2 DD, mod Ca2+ Ao annulus, mild MR.  [name] Slurred speech - Chronic   . Type 2 diabetes mellitus (HCC)     Past Surgical History:  Procedure Laterality Date  . CORONARY CTO INTERVENTION N/A 07/11/2016   Procedure: Coronary CTO Intervention;  Surgeon: Peter M Swaziland, MD;  Location: [facility] INVASIVE CV LAB;  Service: Cardiovascular;  Laterality: N/A;  .  CORONARY STENT INTERVENTION N/A 07/09/2016   Procedure: Coronary Stent Intervention;  Surgeon: Runell Gess, MD;  Location: [facility] INVASIVE CV LAB;  Service: Cardiovascular;  Laterality: N/A;  . CORONARY STENT INTERVENTION N/A 04/24/2019   Procedure: CORONARY STENT INTERVENTION;  Surgeon: Marykay Lex, MD;  Location: [facility] INVASIVE CV LAB;  Service: Cardiovascular;  Laterality: N/A;  . Knee arthroscopic surgery Right   . LEFT HEART CATH AND CORONARY ANGIOGRAPHY N/A 07/05/2016   Procedure: Left Heart Cath and Coronary Angiography;  Surgeon: Lyn Records, MD;  Location: [facility] INVASIVE CV LAB;  Service: Cardiovascular;  Laterality: N/A;  . LEFT HEART CATH AND CORONARY ANGIOGRAPHY N/A  04/24/2019   Procedure: LEFT HEART CATH AND CORONARY ANGIOGRAPHY;  Surgeon: Marykay Lex, MD;  Location: [facility] INVASIVE CV LAB;  Service: Cardiovascular;  Laterality: N/A;    Current Medications: Current Meds  Medication Sig  . aspirin 81 MG chewable tablet Chew 1 tablet (81 mg total) by mouth daily with breakfast.  . atorvastatin (LIPITOR) 80 MG tablet Take 1 tablet (80 mg total) by mouth daily. (Patient taking differently: Take 80 mg by mouth at bedtime.)  . clopidogrel (PLAVIX) 75 MG tablet Take 1 tablet (75 mg total) by mouth daily.  [name] gabapentin (NEURONTIN) 300 MG capsule Take 1 capsule (300 mg total) by mouth 3 (three) times daily.  . insulin aspart (NOVOLOG) 100 UNIT/ML FlexPen Inject 0-10 Units into the skin See admin instructions. insulin aspart (novoLOG) injection 0-10 Units 0-10 Units Subcutaneous, 3 times daily with meals CBG < 70: Implement Hypoglycemia Standing Orders and refer to Hypoglycemia Standing Orders sidebar report  CBG 70 - 120: 0 unit CBG 121 - 150: 0 unit  CBG 151 - 200: 1 unit CBG 201 - 250: 2 units CBG 251 - 300: 4 units CBG 301 - 350: 6 units  CBG 351 - 400: 8 units  CBG > 400: 10 units  . insulin glargine (LANTUS) 100 UNIT/ML injection Inject 0.1 mLs (10 Units total) into the skin at bedtime. (Patient taking differently: Inject 20 Units into the skin at bedtime.)  . isosorbide mononitrate (IMDUR) 60 MG 24 hr tablet Take 1 tablet (60 mg total) by mouth daily.  . nitroGLYCERIN (NITROSTAT) 0.4 MG SL tablet Place 1 tablet (0.4 mg total) under the tongue every 5 (five) minutes as needed for chest pain.  . Omega-3 1000 MG CAPS Take 2 capsules by mouth daily.   . Oxcarbazepine (TRILEPTAL) 300 MG tablet Take 1/2 tablet twice a day for 1 week, then increase to 1 tablet twice a day for 1 week, then increase to 2 tablets twice a day and continue  . pantoprazole (PROTONIX) 40 MG tablet Take 1 tablet (40 mg total) by mouth daily.  . sacubitril-valsartan (ENTRESTO) 49-51 MG Take 1  tablet by mouth 2 (two) times daily.  . sertraline (ZOLOFT) 50 MG tablet Take 1 tablet (50 mg total) by mouth daily.  . sodium bicarbonate 650 MG tablet Take 650 mg by mouth 2 (two) times daily.  . [DISCONTINUED] levETIRAcetam (KEPPRA) 500 MG tablet Take 1 tablet (500 mg total) by mouth 2 (two) times daily.     Allergies:   Penicillins   Social History   Socioeconomic History  . Marital status: Married    Spouse name: Not on file  . Number of children: Not on file  . Years of education: Not on file  . Highest education level: Not on file  Occupational History  . Not on file  Tobacco  Use  . Smoking status: Never Smoker  . Smokeless tobacco: Never Used  Vaping Use  . Vaping Use: Never used  Substance and Sexual Activity  . Alcohol use: No    Alcohol/week: 1.0 standard drink    Types: 1 Cans of beer per week  . Drug use: No  . Sexual activity: Yes  Other Topics Concern  . Not on file  Social History Narrative   Married with one child   [place] in [place], [place]   Right Handed   One story home   Drinks some caffeine (Green Tea)   Social Determinants of Health   Financial Resource Strain: Not on file  Food Insecurity: Not on file  Transportation Needs: Not on file  Physical Activity: Not on file  Stress: Not on file  Social Connections: Not on file     Family History: The patient's family history includes Diabetes Mellitus II in an other family member; Heart disease in his mother.  ROS:   Please see the history of present illness.     All other systems reviewed and are negative.  EKGs/Labs/Other Studies Reviewed:    The following studies were reviewed today: Cath/PCI 04/24/2019-  EKG:  EKG is ordered today.  The ekg ordered today demonstrates NSR, HR 86, inferior and AL Qs- no change  Recent Labs: 07/01/2019: Magnesium 1.5; TSH 1.972 07/03/2019: ALT 19 09/12/2019: BUN 18; Creatinine, Ser 1.80; Hemoglobin 14.9; Platelets 262; Potassium 4.9; Sodium 137  Recent Lipid  Panel    Component Value Date/Time   CHOL 146 07/27/2016 1039   TRIG 170 (H) 07/27/2016 1039   HDL 62 07/27/2016 1039   CHOLHDL 2.4 07/27/2016 1039   CHOLHDL 2.1 07/06/2016 0534   VLDL 19 07/06/2016 0534   LDLCALC 50 07/27/2016 1039    Physical Exam:    VS:  BP 140/74   Pulse 86   Ht 5\' 7"  (1.702 m)   Wt 159 lb (72.1 kg)   SpO2 96%   BMI 24.90 kg/m     Wt Readings from Last 3 Encounters:  05/10/20 159 lb (72.1 kg)  11/10/19 163 lb (73.9 kg)  09/30/19 172 lb (78 kg)     GEN:  Well nourished, well developed in no acute distress HEENT: Normal NECK: No JVD; No carotid bruits CARDIAC: RRR, no murmurs, rubs, gallops RESPIRATORY:  Clear to auscultation without rales, wheezing or rhonchi  ABDOMEN: Soft, non-tender, non-distended MUSCULOSKELETAL:  No edema; No deformity  SKIN: Warm and dry NEUROLOGIC:  Alert and oriented x 3- slurred speech PSYCHIATRIC:  Normal affect   ASSESSMENT:    Chest pain with moderate risk for cardiac etiology Check lexiscan  CAD S/P percutaneous coronary angioplasty LAD PCI 2010 ISR 2018- PCI Jan 2021- two site RCA PCI with DES  Chronic kidney disease (CKD), active medical management without dialysis, stage 3 (moderate) Refer to [facility] for CRI 3 with IDDM.  Ischemic cardiomyopathy Entresto resumed July 2021- check BMP as he has had hyperkalemia in the past  Seizure [facility](HCC) F/U with [facility] about his medications  Type 2 diabetes mellitus with complication, with long-term current use of insulin (HCC) Referred to his new PCP to manage  PLAN:    As above- check, BMP, check Myoview, refer to renal, encouraged to contact [facility].   Medication Adjustments/Labs and Tests Ordered: Current medicines are reviewed at length with the patient today.  Concerns regarding medicines are outlined above.  Orders Placed This Encounter  Procedures  . Basic Metabolic Panel (  BMET)  . Ambulatory referral to Nephrology  .  MYOCARDIAL PERFUSION IMAGING  . EKG 12-Lead   No orders of the defined types were placed in this encounter.   Patient Instructions   Medication Instructions:  Continue current medications  *If you need a refill on your cardiac medications before your next appointment, please call your pharmacy*   Lab Work: [facility] If you have labs (blood work) drawn today and your tests are completely normal, you will receive your results only by: [name] MyChart Message (if you have MyChart) OR . A paper copy in the mail If you have any lab test that is abnormal or we need to change your treatment, we will call you to review the results.   Testing/Procedures: Your physician has requested that you have a lexiscan myoview. For further information please visit [email or web address] Please follow instruction sheet, as given.  Follow-Up: At [facility], you and your health needs are our priority.  As part of our continuing mission to provide you with exceptional heart care, we have created designated Provider Care Teams.  These Care Teams include your primary Cardiologist (physician) and Advanced Practice Providers (APPs -  Physician Assistants and Nurse Practitioners) who all work together to provide you with the care you need, when you need it.  We recommend signing up for the patient portal called "MyChart".  Sign up information is provided on this After Visit Summary.  MyChart is used to connect with patients for Virtual Visits (Telemedicine).  Patients are able to view lab/test results, encounter notes, upcoming appointments, etc.  Non-urgent messages can be sent to your provider as well.   To learn more about what you can do with MyChart, go to [email or web address].    Your next appointment:   2-3 week(s)  The format for your next appointment:   In Person  Provider:   You may see Rollene Rotunda, MD or one of the following Advanced Practice Providers on your designated Care Team:    Theodore Demark, PA-C  Joni Reining, DNP, ANP    Other Instructions You have been referred to [facility] Address: [address], [place], [place] [address] Phone: [phone]  Dr Caryl Ada Neurologist Phone: [phone] Fax: [phone]  [address] [place] [address]     Jolene Provost, PA-C  05/10/2020 9:01 AM    [facility]

## 2020-05-10 NOTE — Assessment & Plan Note (Signed)
LAD PCI 2010 ISR 2018- PCI Jan 2021- two site RCA PCI with DES

## 2020-05-12 ENCOUNTER — Encounter: Payer: Self-pay | Admitting: *Deleted

## 2020-05-12 ENCOUNTER — Telehealth: Payer: Self-pay | Admitting: *Deleted

## 2020-05-12 DIAGNOSIS — Z79899 Other long term (current) drug therapy: Secondary | ICD-10-CM

## 2020-05-12 NOTE — Telephone Encounter (Signed)
-----   Message from Abelino Derrick, [place] sent at 05/11/2020  8:00 AM EST ----- Please let the patient know his potasium is again too high on Entresto.  He should stop this and repeat BMP in two weeks.   LUKE KILROY PA-C 05/11/2020 8:00 AM

## 2020-05-12 NOTE — Telephone Encounter (Signed)
Pt aware of his blood work, BMP ordered and mail to pt, pt made aware to get BMP done in 2 weeks

## 2020-05-13 ENCOUNTER — Other Ambulatory Visit: Payer: Self-pay

## 2020-05-16 ENCOUNTER — Ambulatory Visit: Payer: Medicaid Other | Admitting: Neurology

## 2020-05-17 ENCOUNTER — Telehealth (HOSPITAL_COMMUNITY): Payer: Self-pay | Admitting: *Deleted

## 2020-05-17 NOTE — Telephone Encounter (Signed)
Close encounter °

## 2020-05-18 ENCOUNTER — Other Ambulatory Visit: Payer: Self-pay

## 2020-05-18 ENCOUNTER — Encounter: Payer: Self-pay | Admitting: Family Medicine

## 2020-05-18 ENCOUNTER — Ambulatory Visit: Payer: Medicaid Other | Admitting: Family Medicine

## 2020-05-18 ENCOUNTER — Other Ambulatory Visit: Payer: Self-pay | Admitting: Family Medicine

## 2020-05-18 VITALS — BP 138/86 | HR 95 | Temp 98.4°F | Ht 67.0 in | Wt 156.2 lb

## 2020-05-18 DIAGNOSIS — E118 Type 2 diabetes mellitus with unspecified complications: Secondary | ICD-10-CM

## 2020-05-18 DIAGNOSIS — Z7689 Persons encountering health services in other specified circumstances: Secondary | ICD-10-CM | POA: Diagnosis not present

## 2020-05-18 DIAGNOSIS — I10 Essential (primary) hypertension: Secondary | ICD-10-CM | POA: Diagnosis not present

## 2020-05-18 DIAGNOSIS — Z9861 Coronary angioplasty status: Secondary | ICD-10-CM | POA: Diagnosis not present

## 2020-05-18 DIAGNOSIS — N183 Chronic kidney disease, stage 3 unspecified: Secondary | ICD-10-CM

## 2020-05-18 DIAGNOSIS — L0291 Cutaneous abscess, unspecified: Secondary | ICD-10-CM | POA: Diagnosis not present

## 2020-05-18 DIAGNOSIS — F419 Anxiety disorder, unspecified: Secondary | ICD-10-CM | POA: Diagnosis not present

## 2020-05-18 DIAGNOSIS — Z794 Long term (current) use of insulin: Secondary | ICD-10-CM

## 2020-05-18 DIAGNOSIS — I2510 Atherosclerotic heart disease of native coronary artery without angina pectoris: Secondary | ICD-10-CM | POA: Diagnosis not present

## 2020-05-18 MED ORDER — SERTRALINE HCL 100 MG PO TABS
100.0000 mg | ORAL_TABLET | Freq: Every day | ORAL | 3 refills | Status: DC
Start: 2020-05-18 — End: 2020-08-29

## 2020-05-18 MED ORDER — SULFAMETHOXAZOLE-TRIMETHOPRIM 800-160 MG PO TABS: 1.0000 | ORAL_TABLET | Freq: Two times a day (BID) | ORAL | 0 refills | Status: AC

## 2020-05-18 MED ORDER — PEN NEEDLES 32G X 5 MM MISC: 3 refills | Status: AC

## 2020-05-18 NOTE — Patient Instructions (Signed)
Skin Abscess  A skin abscess is an infected area on or under your skin that contains a collection of pus and other material. An abscess may also be called a furuncle, carbuncle, or boil. An abscess can occur in or on almost any part of your body. Some abscesses break open (rupture) on their own. Most continue to get worse unless they are treated. The infection can spread deeper into the body and eventually into your blood, which can make you feel ill. Treatment usually involves draining the abscess. What are the causes? An abscess occurs when germs, like bacteria, pass through your skin and cause an infection. This may be caused by:  A scrape or cut on your skin.  A puncture wound through your skin, including a needle injection or insect bite.  Blocked oil or sweat glands.  Blocked and infected hair follicles.  A cyst that forms beneath your skin (sebaceous cyst) and becomes infected. What increases the risk? This condition is more likely to develop in people who:  Have a weak body defense system (immune system).  Have diabetes.  Have dry and irritated skin.  Get frequent injections or use illegal IV drugs.  Have a foreign body in a wound, such as a splinter.  Have problems with their lymph system or veins. What are the signs or symptoms? Symptoms of this condition include:  A painful, firm bump under the skin.  A bump with pus at the top. This may break through the skin and drain. Other symptoms include:  Redness surrounding the abscess site.  Warmth.  Swelling of the lymph nodes (glands) near the abscess.  Tenderness.  A sore on the skin. How is this diagnosed? This condition may be diagnosed based on:  A physical exam.  Your medical history.  A sample of pus. This may be used to find out what is causing the infection.  Blood tests.  Imaging tests, such as an ultrasound, CT scan, or MRI. How is this treated? A small abscess that drains on its own may not  need treatment. Treatment for larger abscesses may include:  Moist heat or heat pack applied to the area several times a day.  A procedure to drain the abscess (incision and drainage).  Antibiotic medicines. For a severe abscess, you may first get antibiotics through an IV and then change to antibiotics by mouth. Follow these instructions at home: Medicines  Take over-the-counter and prescription medicines only as told by your health care provider.  If you were prescribed an antibiotic medicine, take it as told by your health care provider. Do not stop taking the antibiotic even if you start to feel better.   Abscess care  If you have an abscess that has not drained, apply heat to the affected area. Use the heat source that your health care provider recommends, such as a moist heat pack or a heating pad. ? Place a towel between your skin and the heat source. ? Leave the heat on for 20-30 minutes. ? Remove the heat if your skin turns bright red. This is especially important if you are unable to feel pain, heat, or cold. You may have a greater risk of getting burned.  Follow instructions from your health care provider about how to take care of your abscess. Make sure you: ? Cover the abscess with a bandage (dressing). ? Change your dressing or gauze as told by your health care provider. ? Wash your hands with soap and water before you change the   dressing or gauze. If soap and water are not available, use hand sanitizer.  Check your abscess every day for signs of a worsening infection. Check for: ? More redness, swelling, or pain. ? More fluid or blood. ? Warmth. ? More pus or a bad smell.   General instructions  To avoid spreading the infection: ? Do not share personal care items, towels, or hot tubs with others. ? Avoid making skin contact with other people.  Keep all follow-up visits as told by your health care provider. This is important. Contact a health care provider if you  have:  More redness, swelling, or pain around your abscess.  More fluid or blood coming from your abscess.  Warm skin around your abscess.  More pus or a bad smell coming from your abscess.  A fever.  Muscle aches.  Chills or a general ill feeling. Get help right away if you:  Have severe pain.  See red streaks on your skin spreading away from the abscess. Summary  A skin abscess is an infected area on or under your skin that contains a collection of pus and other material.  A small abscess that drains on its own may not need treatment.  Treatment for larger abscesses may include having a procedure to drain the abscess and taking an antibiotic. This information is not intended to replace advice given to you by your health care provider. Make sure you discuss any questions you have with your health care provider. Document Revised: 07/03/2018 Document Reviewed: 04/25/2017 Elsevier Patient Education  2021 [employer]  

## 2020-05-18 NOTE — Progress Notes (Signed)
New Patient Office Visit  Subjective:  Patient ID: [name], male    DOB: 1970/07/19  Age: 50 y.o. MRN: [id number]  CC:  Chief Complaint  Patient presents with  . New Patient (Initial Visit)    HPI [name] presents to establish care. He is followed by Dr. Antoine [place] for cardiology. He was seen on 05/10/20 and his [name] was discontinued due to hyperkalemia. He needs to have a repeat BMP around 05/24/20 to follow this. He has been referred to nephrology for CKD and has an appointment with [name] on 05/24/20. He is followed by [name] for seizures.   1. Anxiety [name] reports an increase in anxiety. He takes zoloft 50 mg daily and reports that he does well on this with side effects. There are young children in the he reports increased anxiety and irritability due to this.   2. Boils on buttocks He has 3 boils for about 1 week. The areas have been draining yellow, green pus. He denies fever. Reports tenderness. 2 of the areas have gotten smaller in size.   Past Medical History:  Diagnosis Date  . Alcohol abuse   . CAD (coronary artery disease), native coronary artery    a. 01/2009 s/p Anterior MI and thrombectomy with BMS to mid LAD;  b. 06/2016 Cath/PCI: LM nl, LAD 24m/d, LCX nl, OM1/2/3 nl, RDA 64m. Initial attempt @ PCI failed followed by successful CTO LAD PCI and DES (2.5x38 Synergy)-->rec for indefinte DAPT.  . CKD (chronic [place] disease), stage III (HCC)   . Essential hypertension   . History of nonadherence to medical treatment   . Hyperlipidemia   . Ischemic cardiomyopathy    a. EF 40% by nuc in 2012;  b. 06/2016 Echo: EF 40-45%, apicalanteroseptal and apical AK, Gr2 DD, mod Ca2+ Ao annulus, mild MR.  [name] Slurred speech - Chronic   . Type 2 diabetes mellitus (HCC)     Past Surgical History:  Procedure Laterality Date  . CORONARY CTO INTERVENTION N/A 07/11/2016   Procedure: Coronary CTO Intervention;  Surgeon: Kevin M Swaziland, MD;  Location: [facility] INVASIVE CV LAB;   Service: Cardiovascular;  Laterality: N/A;  . CORONARY STENT INTERVENTION N/A 07/09/2016   Procedure: Coronary Stent Intervention;  Surgeon: Kevin Gess, MD;  Location: [facility] INVASIVE CV LAB;  Service: Cardiovascular;  Laterality: N/A;  . CORONARY STENT INTERVENTION N/A 04/24/2019   Procedure: CORONARY STENT INTERVENTION;  Surgeon: Kevin Lex, MD;  Location: [facility] INVASIVE CV LAB;  Service: Cardiovascular;  Laterality: N/A;  . Knee arthroscopic surgery Right   . LEFT HEART CATH AND CORONARY ANGIOGRAPHY N/A 07/05/2016   Procedure: Left Heart Cath and Coronary Angiography;  Surgeon: Kevin Records, MD;  Location: [facility] INVASIVE CV LAB;  Service: Cardiovascular;  Laterality: N/A;  . LEFT HEART CATH AND CORONARY ANGIOGRAPHY N/A 04/24/2019   Procedure: LEFT HEART CATH AND CORONARY ANGIOGRAPHY;  Surgeon: Kevin Lex, MD;  Location: [facility] INVASIVE CV LAB;  Service: Cardiovascular;  Laterality: N/A;    Family History  Problem Relation Age of Onset  . Diabetes Mellitus II Other   . Heart disease Mother     Social History   Socioeconomic History  . Marital status: Married    Spouse name: Not on file  . Number of children: Not on file  . Years of education: Not on file  . Highest education level: Not on file  Occupational History  . Not on file  Tobacco Use  . Smoking status: Never  Smoker  . Smokeless tobacco: Never Used  Vaping Use  . Vaping Use: Never used  Substance and Sexual Activity  . Alcohol use: No    Alcohol/week: 1.0 standard drink    Types: 1 Cans of beer per week  . Drug use: No  . Sexual activity: Yes  Other Topics Concern  . Not on file  Social History Narrative   Married with one child   Lives in [name], [place]   Right Handed   One story home   Drinks some caffeine (Green Tea)   Social Determinants of Health   Financial Resource Strain: Not on file  Food Insecurity: Not on file  Transportation Needs: Not on file  Physical Activity: Not on file  Stress: Not on  file  Social Connections: Not on file  Intimate Partner Violence: Not on file    ROS Review of Systems As per HPI.   Objective:   Today's Vitals: BP 138/86 Comment: manual recheck  Pulse 95   Temp 98.4 F (36.9 C) (Temporal)   Ht 5\' 7"  (1.702 m)   Wt 156 lb 4 oz (70.9 kg)   BMI 24.47 kg/m   Physical Exam Vitals and nursing note reviewed.  Constitutional:      General: He is not in acute distress.    Appearance: Normal appearance. He is not ill-appearing.  Cardiovascular:     Rate and Rhythm: Normal rate and regular rhythm.     Pulses: Normal pulses.     Heart sounds: Normal heart sounds. No murmur heard.   Pulmonary:     Effort: Pulmonary effort is normal. No respiratory distress.     Breath sounds: Normal breath sounds.  Musculoskeletal:     Right lower leg: No edema.     Left lower leg: No edema.  Skin:    General: Skin is warm and dry.     Comments: 3 small abscess present in lower gluteal fold. Largest one is 2 cm x 2 cm. Areas are open with scant yellow drainage present. No areas of fluctuance or induration.   Neurological:     General: No focal deficit present.     Mental Status: He is alert and oriented to person, place, and time.  Psychiatric:        Mood and Affect: Mood normal.        Behavior: Behavior normal.     Assessment & Plan:   [name] was seen today for new patient (initial visit).  Diagnoses and all orders for this visit:  Abscess Start bactrim. Warm compresses. Keep area clean and dry.  -     sulfamethoxazole-trimethoprim (BACTRIM DS) 800-160 MG tablet; Take 1 tablet by mouth 2 (two) times daily for 7 days.  Type 2 diabetes mellitus with complication, with long-term current use of insulin (HCC) Refill for pen needles provided. He is unsure of when he last had an a1C. Follow up in 1 weeks for chronic follow up with lab work.  -     Insulin Pen Needle (PEN NEEDLES) 32G X 5 MM MISC; Use with Novolog Flexpen as directed  Essential  hypertension BP at goal today. Follow up in 1 week for chronic follow up and lab work.   CAD S/P percutaneous coronary angioplasty Managed by cardiology.  Chronic [place] disease (CKD), active medical management without dialysis, stage 3 (moderate) (HCC) Appointment with [name] [place] scheduled for 05/24/20.  Anxiety Increase zoloft to 100 mg daily. Return to office for new or worsening symptoms.  -  sertraline (ZOLOFT) 100 MG tablet; Take 1 tablet (100 mg total) by mouth daily.  Encounter to establish care Reviewed available [place]. Will address HM at next visit.    Follow-up: Keep scheduled appointment on 05/25/20 for chronic follow up.  The patient indicates understanding of these issues and agrees with the plan.   Gabriel Earing, FNP

## 2020-05-19 ENCOUNTER — Encounter (HOSPITAL_COMMUNITY): Payer: Self-pay | Admitting: Cardiology

## 2020-05-19 ENCOUNTER — Ambulatory Visit (HOSPITAL_COMMUNITY)
Admission: RE | Admit: 2020-05-19 | Payer: Medicaid Other | Source: Ambulatory Visit | Attending: Cardiology | Admitting: Cardiology

## 2020-05-24 DIAGNOSIS — I255 Ischemic cardiomyopathy: Secondary | ICD-10-CM | POA: Diagnosis not present

## 2020-05-24 DIAGNOSIS — E1129 Type 2 diabetes mellitus with other diabetic kidney complication: Secondary | ICD-10-CM | POA: Diagnosis not present

## 2020-05-24 DIAGNOSIS — E875 Hyperkalemia: Secondary | ICD-10-CM | POA: Diagnosis not present

## 2020-05-24 DIAGNOSIS — N183 Chronic kidney disease, stage 3 unspecified: Secondary | ICD-10-CM | POA: Diagnosis not present

## 2020-05-24 DIAGNOSIS — I129 Hypertensive chronic kidney disease with stage 1 through stage 4 chronic kidney disease, or unspecified chronic kidney disease: Secondary | ICD-10-CM | POA: Diagnosis not present

## 2020-05-25 ENCOUNTER — Ambulatory Visit: Payer: Medicaid Other | Admitting: Family Medicine

## 2020-05-25 ENCOUNTER — Other Ambulatory Visit: Payer: Self-pay | Admitting: Internal Medicine

## 2020-05-25 DIAGNOSIS — N183 Chronic kidney disease, stage 3 unspecified: Secondary | ICD-10-CM

## 2020-05-26 ENCOUNTER — Other Ambulatory Visit: Payer: Self-pay

## 2020-05-26 ENCOUNTER — Ambulatory Visit (INDEPENDENT_AMBULATORY_CARE_PROVIDER_SITE_OTHER): Payer: Medicaid Other

## 2020-05-26 ENCOUNTER — Encounter: Payer: Self-pay | Admitting: Family Medicine

## 2020-05-26 ENCOUNTER — Ambulatory Visit: Payer: Medicaid Other | Admitting: Family Medicine

## 2020-05-26 VITALS — BP 136/82 | HR 69 | Temp 98.4°F | Ht 67.0 in | Wt 160.6 lb

## 2020-05-26 DIAGNOSIS — Z9861 Coronary angioplasty status: Secondary | ICD-10-CM | POA: Diagnosis not present

## 2020-05-26 DIAGNOSIS — G8929 Other chronic pain: Secondary | ICD-10-CM | POA: Diagnosis not present

## 2020-05-26 DIAGNOSIS — R569 Unspecified convulsions: Secondary | ICD-10-CM

## 2020-05-26 DIAGNOSIS — M25562 Pain in left knee: Secondary | ICD-10-CM | POA: Diagnosis not present

## 2020-05-26 DIAGNOSIS — Z6825 Body mass index (BMI) 25.0-25.9, adult: Secondary | ICD-10-CM | POA: Diagnosis not present

## 2020-05-26 DIAGNOSIS — R5383 Other fatigue: Secondary | ICD-10-CM | POA: Diagnosis not present

## 2020-05-26 DIAGNOSIS — I10 Essential (primary) hypertension: Secondary | ICD-10-CM

## 2020-05-26 DIAGNOSIS — E118 Type 2 diabetes mellitus with unspecified complications: Secondary | ICD-10-CM

## 2020-05-26 DIAGNOSIS — I2510 Atherosclerotic heart disease of native coronary artery without angina pectoris: Secondary | ICD-10-CM

## 2020-05-26 DIAGNOSIS — N183 Chronic kidney disease, stage 3 unspecified: Secondary | ICD-10-CM | POA: Diagnosis not present

## 2020-05-26 DIAGNOSIS — Z794 Long term (current) use of insulin: Secondary | ICD-10-CM

## 2020-05-26 LAB — BAYER DCA HB A1C WAIVED: HB A1C (BAYER DCA - WAIVED): 9.2 % — ABNORMAL HIGH (ref <7.0)

## 2020-05-26 MED ORDER — INSULIN GLARGINE 100 UNIT/ML ~~LOC~~ SOLN: 14.0000 [IU] | Freq: Every day | SUBCUTANEOUS | 4 refills | Status: DC

## 2020-05-26 NOTE — Patient Instructions (Addendum)
Call [facility] [phone].  Call if fasting blood sugars stay above 180  Diabetes Mellitus and Foot Care Foot care is an important part of your health, especially when you have diabetes. Diabetes may cause you to have problems because of poor blood flow (circulation) to your feet and legs, which can cause your skin to:  Become thinner and drier.  Break more easily.  Heal more slowly.  Peel and crack. You may also have nerve damage (neuropathy) in your legs and feet, causing decreased feeling in them. This means that you may not notice minor injuries to your feet that could lead to more serious problems. Noticing and addressing any potential problems early is the best way to prevent future foot problems. How to care for your feet Foot hygiene  Wash your feet daily with warm water and mild soap. Do not use hot water. Then, pat your feet and the areas between your toes until they are completely dry. Do not soak your feet as this can dry your skin.  Trim your toenails straight across. Do not dig under them or around the cuticle. File the edges of your nails with an emery board or nail file.  Apply a moisturizing lotion or petroleum jelly to the skin on your feet and to dry, brittle toenails. Use lotion that does not contain alcohol and is unscented. Do not apply lotion between your toes.   Shoes and socks  Wear clean socks or stockings every day. Make sure they are not too tight. Do not wear knee-high stockings since they may decrease blood flow to your legs.  Wear shoes that fit properly and have enough cushioning. Always look in your shoes before you put them on to be sure there are no objects inside.  To break in new shoes, wear them for just a few hours a day. This prevents injuries on your feet. Wounds, scrapes, corns, and calluses  Check your feet daily for blisters, cuts, bruises, sores, and redness. If you cannot see the bottom of your feet, use a mirror or ask someone  for help.  Do not cut corns or calluses or try to remove them with medicine.  If you find a minor scrape, cut, or break in the skin on your feet, keep it and the skin around it clean and dry. You may clean these areas with mild soap and water. Do not clean the area with peroxide, alcohol, or iodine.  If you have a wound, scrape, corn, or callus on your foot, look at it several times a day to make sure it is healing and not infected. Check for: ? Redness, swelling, or pain. ? Fluid or blood. ? Warmth. ? Pus or a bad smell.   General tips  Do not cross your legs. This may decrease blood flow to your feet.  Do not use heating pads or hot water bottles on your feet. They may burn your skin. If you have lost feeling in your feet or legs, you may not know this is happening until it is too late.  Protect your feet from hot and cold by wearing shoes, such as at the beach or on hot pavement.  Schedule a complete foot exam at least once a year (annually) or more often if you have foot problems. Report any cuts, sores, or bruises to your health care provider immediately. Where to find more information  American Diabetes Association: www.diabetes.org  Association of Diabetes Care & Education Specialists: www.diabeteseducator.org Contact a health care provider  if:  You have a medical condition that increases your risk of infection and you have any cuts, sores, or bruises on your feet.  You have an injury that is not healing.  You have redness on your legs or feet.  You feel burning or tingling in your legs or feet.  You have pain or cramps in your legs and feet.  Your legs or feet are numb.  Your feet always feel cold.  You have pain around any toenails. Get help right away if:  You have a wound, scrape, corn, or callus on your foot and: ? You have pain, swelling, or redness that gets worse. ? You have fluid or blood coming from the wound, scrape, corn, or callus. ? Your wound,  scrape, corn, or callus feels warm to the touch. ? You have pus or a bad smell coming from the wound, scrape, corn, or callus. ? You have a fever. ? You have a red line going up your leg. Summary  Check your feet every day for blisters, cuts, bruises, sores, and redness.  Apply a moisturizing lotion or petroleum jelly to the skin on your feet and to dry, brittle toenails.  Wear shoes that fit properly and have enough cushioning.  If you have foot problems, report any cuts, sores, or bruises to your health care provider immediately.  Schedule a complete foot exam at least once a year (annually) or more often if you have foot problems. This information is not intended to replace advice given to you by your health care provider. Make sure you discuss any questions you have with your health care provider. Document Revised: 10/01/2019 Document Reviewed: 10/01/2019 Elsevier Patient Education  2021 [employer].

## 2020-05-26 NOTE — Progress Notes (Signed)
Established Patient Office Visit  Subjective:  Patient ID: [name], male    DOB: 07-30-1970  Age: 50 y.o. MRN: [id number]  CC:  Chief Complaint  Patient presents with  . Medical Management of Chronic Issues    HPI [name] presents for chronic follow up.  1. T2DM  Current symptoms include paresthesia of the feet. Patient denies hypoglycemia , nausea, polydipsia, polyuria, visual disturbances, vomiting and weight loss.  Current diabetic medications include: Lantus 10 units at night. Novolog sliding scale Compliant with meds - Yes  Current monitoring regimen: home blood tests - 2-3 times daily Home blood sugar records: fasting range: 200s Any episodes of hypoglycemia? no  Known diabetic complications: nephropathy, cardiovascular disease and peripheral vascular disease Eye exam current (within one year): yes, Novemember 2021 Podiatry yearly?  No Weight trend: stable Current diet: diabetic Current exercise: walking and yard work  2. CKD Saw kidney doctor yesterday and reports that things are stable. They have ordered a scan at [facility] for him.  3. Seizures On Keppra. Denies seizures since he has been on Keppra. Managed by neurologist at [facility] Last visit was in August. Hasn't followed up since.   4. Chronic knee pain [name] reports chronic pain of his left knee with recent worsening. It is a constant pain. Pain is moderate. He denies known injury or trauma. He has had a knee replacement of his right knee in the past. He takes tylenol for the pain.   Past Medical History:  Diagnosis Date  . Alcohol abuse   . CAD (coronary artery disease), native coronary artery    a. 01/2009 s/p Anterior MI and thrombectomy with BMS to mid LAD;  b. 06/2016 Cath/PCI: LM nl, LAD 41md, LCX nl, OM1/2/3 nl, RDA 661mInitial attempt @ PCI failed followed by successful CTO LAD PCI and DES (2.5x38 Synergy)-->rec for indefinte DAPT.  . CKD (chronic kidney disease), stage III ([facility]  . Essential  hypertension   . History of nonadherence to medical treatment   . Hyperlipidemia   . Ischemic cardiomyopathy    a. EF 40% by nuc in 2012;  b. 06/2016 Echo: EF 40-45%, apicalanteroseptal and apical AK, Gr2 DD, mod Ca2+ Ao annulus, mild MR.  . Marland Kitchenlurred speech - Chronic   . Type 2 diabetes mellitus ([facility]    Past Surgical History:  Procedure Laterality Date  . CORONARY CTO INTERVENTION N/A 07/11/2016   Procedure: Coronary CTO Intervention;  Surgeon: Peter M JoMartiniqueMD;  [facility];  Service: Cardiovascular;  Laterality: N/A;  . CORONARY STENT INTERVENTION N/A 07/09/2016   Procedure: Coronary Stent Intervention;  Surgeon: JoLorretta HarpMD;  [facility];  Service: Cardiovascular;  Laterality: N/A;  . CORONARY STENT INTERVENTION N/A 04/24/2019   Procedure: CORONARY STENT INTERVENTION;  Surgeon: HaLeonie ManMD;  [facility];  Service: Cardiovascular;  Laterality: N/A;  . Knee arthroscopic surgery Right   . LEFT HEART CATH AND CORONARY ANGIOGRAPHY N/A 07/05/2016   Procedure: Left Heart Cath and Coronary Angiography;  Surgeon: HeBelva CromeMD;  [facility];  Service: Cardiovascular;  Laterality: N/A;  . LEFT HEART CATH AND CORONARY ANGIOGRAPHY N/A 04/24/2019   Procedure: LEFT HEART CATH AND CORONARY ANGIOGRAPHY;  Surgeon: HaLeonie ManMD;  [facility];  Service: Cardiovascular;  Laterality: N/A;    Family History  Problem Relation Age of Onset  . Diabetes Mellitus II Other   .  Heart disease Mother     Social History   Socioeconomic History  . Marital status: Married    Spouse name: Not on file  . Number of children: Not on file  . Years of education: Not on file  . Highest education level: Not on file  Occupational History  . Not on file  Tobacco Use  . Smoking status: Never Smoker  . Smokeless tobacco: Never Used  Vaping Use  . Vaping Use: Never used  Substance and Sexual Activity  .  Alcohol use: No    Alcohol/week: 1.0 standard drink    Types: 1 Cans of beer per week  . Drug use: No  . Sexual activity: Yes  Other Topics Concern  . Not on file  Social History Narrative   Married with one child   Lives in [place], [place]   Right Handed   One story home   Drinks some caffeine (Green Tea)   Social Determinants of Health   Financial Resource Strain: Not on file  Food Insecurity: Not on file  Transportation Needs: Not on file  Physical Activity: Not on file  Stress: Not on file  Social Connections: Not on file  Intimate Partner Violence: Not on file    Outpatient Medications Prior to Visit  Medication Sig Dispense Refill  . aspirin 81 MG chewable tablet Chew 1 tablet (81 mg total) by mouth daily with breakfast. 120 tablet 2  . atorvastatin (LIPITOR) 80 MG tablet Take 1 tablet (80 mg total) by mouth daily. (Patient taking differently: Take 80 mg by mouth at bedtime.) 30 tablet 11  . carvedilol (COREG) 3.125 MG tablet Take 1 tablet (3.125 mg total) by mouth 2 (two) times daily with a meal. 180 tablet 3  . clopidogrel (PLAVIX) 75 MG tablet Take 1 tablet (75 mg total) by mouth daily. 30 tablet 6  . gabapentin (NEURONTIN) 300 MG capsule Take 1 capsule (300 mg total) by mouth 3 (three) times daily. 90 capsule 3  . insulin aspart (NOVOLOG) 100 UNIT/ML FlexPen Inject 0-10 Units into the skin See admin instructions. insulin aspart (novoLOG) injection 0-10 Units 0-10 Units Subcutaneous, 3 times daily with meals CBG < 70: Implement Hypoglycemia Standing Orders and refer to Hypoglycemia Standing Orders sidebar report  CBG 70 - 120: 0 unit CBG 121 - 150: 0 unit  CBG 151 - 200: 1 unit CBG 201 - 250: 2 units CBG 251 - 300: 4 units CBG 301 - 350: 6 units  CBG 351 - 400: 8 units  CBG > 400: 10 units 15 mL 11  . insulin glargine (LANTUS) 100 UNIT/ML injection Inject 0.1 mLs (10 Units total) into the skin at bedtime. (Patient taking differently: Inject 20 Units into the skin at bedtime.) 10  mL 11  . Insulin Pen Needle (PEN NEEDLES) 32G X 5 MM MISC Use with Novolog Flexpen as directed 100 each 3  . Insulin Syringe-Needle U-100 (TRUEPLUS INSULIN SYRINGE) 31G X 5/16" 1 ML MISC Use to inject insulin once daily Dx E11.8 100 each 3  . isosorbide mononitrate (IMDUR) 60 MG 24 hr tablet Take 1 tablet (60 mg total) by mouth daily. 90 tablet 3  . levETIRAcetam (KEPPRA) 500 MG tablet Take 500 mg by mouth 2 (two) times daily.    . nitroGLYCERIN (NITROSTAT) 0.4 MG SL tablet Place 1 tablet (0.4 mg total) under the tongue every 5 (five) minutes as needed for chest pain. 25 tablet 2  . Omega-3 1000 MG CAPS Take 2 capsules by mouth   daily.     . Oxcarbazepine (TRILEPTAL) 300 MG tablet Take 1/2 tablet twice a day for 1 week, then increase to 1 tablet twice a day for 1 week, then increase to 2 tablets twice a day and continue 120 tablet 5  . pantoprazole (PROTONIX) 40 MG tablet Take 1 tablet (40 mg total) by mouth daily. 30 tablet 5  . sacubitril-valsartan (ENTRESTO) 49-51 MG Take 1 tablet by mouth 2 (two) times daily. 60 tablet 6  . sertraline (ZOLOFT) 100 MG tablet Take 1 tablet (100 mg total) by mouth daily. 30 tablet 3  . sodium bicarbonate 650 MG tablet Take 650 mg by mouth 2 (two) times daily.     No facility-administered medications prior to visit.    Allergies  Allergen Reactions  . Penicillins Rash         ROS Review of Systems Negative unless specially indicated above in HPI.   Objective:    Physical Exam Vitals and nursing note reviewed.  Constitutional:      General: He is not in acute distress.    Appearance: Normal appearance. He is not ill-appearing, toxic-appearing or diaphoretic.  HENT:     Head: Normocephalic and atraumatic.  Eyes:     Conjunctiva/sclera: Conjunctivae normal.     Pupils: Pupils are equal, round, and reactive to light.  Cardiovascular:     Rate and Rhythm: Normal rate and regular rhythm.     Heart sounds: Normal heart sounds. No murmur  heard.   Pulmonary:     Effort: Pulmonary effort is normal. No respiratory distress.     Breath sounds: Normal breath sounds.  Abdominal:     General: Bowel sounds are normal. There is no distension.     Palpations: Abdomen is soft.     Tenderness: There is no abdominal tenderness.  Musculoskeletal:     Cervical back: Neck supple. No tenderness.     Left knee: No swelling, deformity, effusion, erythema or bony tenderness. Normal range of motion. No tenderness. Normal alignment and normal patellar mobility.     Right lower leg: No edema.     Left lower leg: No edema.  Skin:    General: Skin is warm and dry.  Neurological:     Mental Status: He is alert and oriented to person, place, and time. Mental status is at baseline.     Motor: No weakness.  Psychiatric:        Mood and Affect: Mood normal.        Behavior: Behavior normal.        Thought Content: Thought content normal.        Judgment: Judgment normal.    Diabetic Foot Exam - Simple   Simple Foot Form Diabetic Foot exam was performed with the following findings: Yes 05/26/2020  2:50 PM  Visual Inspection See comments: Yes Sensation Testing Intact to touch and monofilament testing bilaterally: Yes Pulse Check Posterior Tibialis and Dorsalis pulse intact bilaterally: Yes Comments Callus to 5th toe bilaterally.       BP 136/82   Pulse 69   Temp 98.4 F (36.9 C) (Temporal)   Ht 5' 7" (1.702 m)   Wt 160 lb 9.6 oz (72.8 kg)   SpO2 99%   BMI 25.15 kg/m  Wt Readings from Last 3 Encounters:  05/26/20 160 lb 9.6 oz (72.8 kg)  05/18/20 156 lb 4 oz (70.9 kg)  05/10/20 159 lb (72.1 kg)     Health Maintenance Due  Topic Date Due  .  Hepatitis C Screening  Never done  . PNEUMOCOCCAL POLYSACCHARIDE VACCINE AGE 44-64 HIGH RISK  Never done  . OPHTHALMOLOGY EXAM  Never done  . COLONOSCOPY (Pts 45-91yr Insurance coverage will need to be confirmed)  Never done  . FOOT EXAM  08/09/2017  . HEMOGLOBIN A1C  01/01/2020     There are no preventive care reminders to display for this patient.  Lab Results  Component Value Date   TSH 1.972 07/01/2019   Lab Results  Component Value Date   WBC 8.8 09/12/2019   HGB 14.9 09/12/2019   HCT 45.0 09/12/2019   MCV 84.0 09/12/2019   PLT 262 09/12/2019   Lab Results  Component Value Date   NA 136 05/10/2020   K 5.7 (H) 05/10/2020   CO2 22 05/10/2020   GLUCOSE 398 (H) 05/10/2020   BUN 13 05/10/2020   CREATININE 1.77 (H) 05/10/2020   BILITOT 0.5 07/03/2019   ALKPHOS 82 07/03/2019   AST 21 07/03/2019   ALT 19 07/03/2019   PROT 6.3 (L) 07/03/2019   ALBUMIN 3.3 (L) 07/03/2019   CALCIUM 9.0 05/10/2020   ANIONGAP 7 09/12/2019   Lab Results  Component Value Date   CHOL 146 07/27/2016   Lab Results  Component Value Date   HDL 62 07/27/2016   Lab Results  Component Value Date   LDLCALC 50 07/27/2016   Lab Results  Component Value Date   TRIG 170 (H) 07/27/2016   Lab Results  Component Value Date   CHOLHDL 2.4 07/27/2016   Lab Results  Component Value Date   HGBA1C 8.0 (H) 07/02/2019      Assessment & Plan:   KShemwas seen today for medical management of chronic issues.  Diagnoses and all orders for this visit:  Type 2 diabetes mellitus with complication, with long-term current use of insulin ([place] Uncontrolled. A1c 9.2 today. Increase Lantus to 14 units at bedtime. Continue sliding scale with Novolog. Notify provider for fasting bloods >180. Foot exam today- order for diabetic shoes given. Eye exam in November. Labs pending as below.  -     CMP14+EGFR -     Lipid panel -     CBC with Differential/Platelet -     Bayer DCA Hb A1c Waived -     For Home Use Only DME Diabetic Shoe -     insulin glargine (LANTUS) 100 UNIT/ML injection; Inject 0.14 mLs (14 Units total) into the skin at bedtime.  Essential hypertension Well controlled on current regimen. Labs pending as below.  -     CMP14+EGFR -     Lipid panel -     CBC with  Differential/Platelet -     Thyroid Panel With TSH  BMI 25.0-25.9,adult Diet and exercise. Labs pending as below.  -     CMP14+EGFR -     Lipid panel -     CBC with Differential/Platelet -     Thyroid Panel With TSH  Chronic pain of left knee Xray today in office, radiology report pending. Tylenol for pain.  -     DG Knee 1-2 Views Left; Future  [facility] [facility] On keppra.  -     CMP14+EGFR -     CBC with Differential/Platelet  Chronic kidney disease (CKD), active medical management without dialysis, stage 3 (moderate) ([facility] [facility]. Stable stage 3a. Labs pending as below.  -     CMP14+EGFR -     CBC with Differential/Platelet  CAD S/P percutaneous coronary angioplasty Managed by cardiology. Reminded patient to schedule appointment with cardiology for follow up as requested. I will CC lab results to cardiology.    Follow-up: Return in about 3 months (around 08/26/2020) for chronic follow up.   The patient indicates understanding of these issues and agrees with the plan.    [name], FNP

## 2020-05-27 ENCOUNTER — Other Ambulatory Visit: Payer: Self-pay

## 2020-05-27 DIAGNOSIS — E875 Hyperkalemia: Secondary | ICD-10-CM

## 2020-05-27 LAB — THYROID PANEL WITH TSH
Free Thyroxine Index: 1.2 (ref 1.2–4.9)
T3 Uptake Ratio: 24 % (ref 24–39)
T4, Total: 5.2 ug/dL (ref 4.5–12.0)
TSH: 3.29 u[IU]/mL (ref 0.450–4.500)

## 2020-05-27 LAB — CBC WITH DIFFERENTIAL/PLATELET
Basophils Absolute: 0 10*3/uL (ref 0.0–0.2)
Basos: 0 %
EOS (ABSOLUTE): 0.1 10*3/uL (ref 0.0–0.4)
Eos: 1 %
Hematocrit: 41.3 % (ref 37.5–51.0)
Hemoglobin: 14.2 g/dL (ref 13.0–17.7)
Immature Grans (Abs): 0 10*3/uL (ref 0.0–0.1)
Immature Granulocytes: 0 %
Lymphocytes Absolute: 1.9 10*3/uL (ref 0.7–3.1)
Lymphs: 25 %
MCH: 28.2 pg (ref 26.6–33.0)
MCHC: 34.4 g/dL (ref 31.5–35.7)
MCV: 82 fL (ref 79–97)
Monocytes Absolute: 0.5 10*3/uL (ref 0.1–0.9)
Monocytes: 7 %
Neutrophils Absolute: 4.9 10*3/uL (ref 1.4–7.0)
Neutrophils: 67 %
Platelets: 252 10*3/uL (ref 150–450)
RBC: 5.03 x10E6/uL (ref 4.14–5.80)
RDW: 13 % (ref 11.6–15.4)
WBC: 7.4 10*3/uL (ref 3.4–10.8)

## 2020-05-27 LAB — CMP14+EGFR
ALT: 13 IU/L (ref 0–44)
AST: 19 IU/L (ref 0–40)
Albumin/Globulin Ratio: 1.9 (ref 1.2–2.2)
Albumin: 4.3 g/dL (ref 4.0–5.0)
Alkaline Phosphatase: 104 IU/L (ref 44–121)
BUN/Creatinine Ratio: 7 — ABNORMAL LOW (ref 9–20)
BUN: 12 mg/dL (ref 6–24)
Bilirubin Total: 0.4 mg/dL (ref 0.0–1.2)
CO2: 22 mmol/L (ref 20–29)
Calcium: 8.9 mg/dL (ref 8.7–10.2)
Chloride: 101 mmol/L (ref 96–106)
Creatinine, Ser: 1.77 mg/dL — ABNORMAL HIGH (ref 0.76–1.27)
Globulin, Total: 2.3 g/dL (ref 1.5–4.5)
Glucose: 315 mg/dL — ABNORMAL HIGH (ref 65–99)
Potassium: 5.4 mmol/L — ABNORMAL HIGH (ref 3.5–5.2)
Sodium: 138 mmol/L (ref 134–144)
Total Protein: 6.6 g/dL (ref 6.0–8.5)
eGFR: 47 mL/min/{1.73_m2} — ABNORMAL LOW (ref >59)

## 2020-05-27 LAB — LIPID PANEL
Chol/HDL Ratio: 4.1 ratio (ref 0.0–5.0)
Cholesterol, Total: 161 mg/dL (ref 100–199)
HDL: 39 mg/dL — ABNORMAL LOW (ref >39)
LDL Chol Calc (NIH): 82 mg/dL (ref 0–99)
Triglycerides: 239 mg/dL — ABNORMAL HIGH (ref 0–149)
VLDL Cholesterol Cal: 40 mg/dL (ref 5–40)

## 2020-06-01 NOTE — Progress Notes (Deleted)
Cardiology Office Note   Date:  06/01/2020   ID:  Kevin HazelKevin Weist, DOB 09/28/1970, MRN [id number]  PCP:  Gabriel EaringMorgan, Tiffany M, FNP  Cardiologist:   Rollene RotundaJames Artem Bunte, MD   No chief complaint on file.     History of Present Illness: [name] is a 50 y.o. male who presents for evaluation of CAD s/p BMS to mLAD 2010 with subsequent ISR managed with PCI/DES to LAD in 2018, chronic combined CHF and ischemic cardiomyopathy. He underwent a LHC which occurred 04/24/2019 and showed severe 2 vessel CAD with 85% mRCA stenosis managed with PCI/DES with 2nd stent placed upstream for catheter related dissection; he had 80% dLAD disease which was not amenable to stenting, and mild-moderate ISR on previous p-mLAD stent. He was recommended for aspirin and plavix as well as aggressive risk factor modifications for remaining disease. Echo 04/29/19 showed EF 35-40%, G1DD, and no significant valvular abnormalities.  He had hyperkalemia and had his Entresto stopped.  He had a follow up echo and had some improvement in his EF to 45%.  The apex is akinetic. He wore a monitor ***    Patient also has several other medical issues.  He has insulin-dependent diabetes.  He has treated dyslipidemia.  He has chronic renal insufficiency 3 with a GFR 44.  There is a history of seizures.  He has chronic slurred speech and there appears to be some cognitive to deficit.  His last ejection fraction was 45% by echocardiogram May 2021.  In the past he has had problems with hyperkalemia.  He was on Entresto at one point and this was stopped.  Dr. Antoine PocheHochrein resume this when he saw him in July since the patient was tolerating losartan.  ***  Patient presents to the office today accompanied by a family member.  They have several complaints and issues.  They say that the patient has had some chest pain.  The episodes sound like they are not exertional.  The patient describes them as sharp and localized and lasting seconds.  There are also  concerned about fatigue and sleepiness.  The family decided to take him off his Keppra.  They are also wondering about other medicines they could stop to reduce his sleepiness.  Patient has been [facility] by [facility], they saw him last in August 2021.  They also tell me he was previously referred to a nephrologist but they never received any information on that.  In addition they also say that Dr. Antoine PocheHochrein told them to the patient could drink a beer a day to stay hydrated, I could find no documentation of that recommendation.  The patient tells me he is not drinking any alcohol now.  The family also tells me they have decided to switch primary care providers to [facility].  They have not actually seen them yet. ***Thankfully since I last saw him he feels better. He is not having any of the dizziness that he was having. He is doing yard work. He thinks his breathing is okay. He has had no further seizures. He was in the hospital previously as previously mentioned and is still following with neurology. He denies any chest pressure, neck or arm discomfort. He has no new shortness of breath, PND or orthopnea. He has had no weight gain or edema.   Past Medical History:  Diagnosis Date  . Alcohol abuse   . CAD (coronary artery disease), native coronary artery    a. 01/2009 s/p Anterior MI and  thrombectomy with BMS to mid LAD;  b. 06/2016 Cath/PCI: LM nl, LAD 78m/d, LCX nl, OM1/2/3 nl, RDA 44m. Initial attempt @ PCI failed followed by successful CTO LAD PCI and DES (2.5x38 Synergy)-->rec for indefinte DAPT.  . CKD (chronic kidney disease), stage III (HCC)   . Essential hypertension   . History of nonadherence to medical treatment   . Hyperlipidemia   . Ischemic cardiomyopathy    a. EF 40% by nuc in 2012;  b. 06/2016 Echo: EF 40-45%, apicalanteroseptal and apical AK, Gr2 DD, mod Ca2+ Ao annulus, mild MR.  [name] Slurred speech - Chronic   . Type 2 diabetes mellitus (HCC)     Past  Surgical History:  Procedure Laterality Date  . CORONARY CTO INTERVENTION N/A 07/11/2016   Procedure: Coronary CTO Intervention;  Surgeon: Peter M Swaziland, MD;  [facility] INVASIVE CV LAB;  Service: Cardiovascular;  Laterality: N/A;  . CORONARY STENT INTERVENTION N/A 07/09/2016   Procedure: Coronary Stent Intervention;  Surgeon: Runell Gess, MD;  Location: [facility] INVASIVE CV LAB;  Service: Cardiovascular;  Laterality: N/A;  . CORONARY STENT INTERVENTION N/A 04/24/2019   Procedure: CORONARY STENT INTERVENTION;  Surgeon: Marykay Lex, MD;  Location: [facility] INVASIVE CV LAB;  Service: Cardiovascular;  Laterality: N/A;  . Knee arthroscopic surgery Right   . LEFT HEART CATH AND CORONARY ANGIOGRAPHY N/A 07/05/2016   Procedure: Left Heart Cath and Coronary Angiography;  Surgeon: Lyn Records, MD;  Location: [facility] INVASIVE CV LAB;  Service: Cardiovascular;  Laterality: N/A;  . LEFT HEART CATH AND CORONARY ANGIOGRAPHY N/A 04/24/2019   Procedure: LEFT HEART CATH AND CORONARY ANGIOGRAPHY;  Surgeon: Marykay Lex, MD;  Location: [facility] INVASIVE CV LAB;  Service: Cardiovascular;  Laterality: N/A;     Current Outpatient Medications  Medication Sig Dispense Refill  . aspirin 81 MG chewable tablet Chew 1 tablet (81 mg total) by mouth daily with breakfast. 120 tablet 2  . atorvastatin (LIPITOR) 80 MG tablet Take 1 tablet (80 mg total) by mouth daily. (Patient taking differently: Take 80 mg by mouth at bedtime.) 30 tablet 11  . carvedilol (COREG) 3.125 MG tablet Take 1 tablet (3.125 mg total) by mouth 2 (two) times daily with a meal. 180 tablet 3  . clopidogrel (PLAVIX) 75 MG tablet Take 1 tablet (75 mg total) by mouth daily. 30 tablet 6  . gabapentin (NEURONTIN) 300 MG capsule Take 1 capsule (300 mg total) by mouth 3 (three) times daily. 90 capsule 3  . insulin aspart (NOVOLOG) 100 UNIT/ML FlexPen Inject 0-10 Units into the skin See admin instructions. insulin aspart (novoLOG) injection 0-10 Units 0-10 Units  Subcutaneous, 3 times daily with meals CBG < 70: Implement Hypoglycemia Standing Orders and refer to Hypoglycemia Standing Orders sidebar report  CBG 70 - 120: 0 unit CBG 121 - 150: 0 unit  CBG 151 - 200: 1 unit CBG 201 - 250: 2 units CBG 251 - 300: 4 units CBG 301 - 350: 6 units  CBG 351 - 400: 8 units  CBG > 400: 10 units 15 mL 11  . insulin glargine (LANTUS) 100 UNIT/ML injection Inject 0.14 mLs (14 Units total) into the skin at bedtime. 10 mL 4  . Insulin Pen Needle (PEN NEEDLES) 32G X 5 MM MISC Use with Novolog Flexpen as directed 100 each 3  . Insulin Syringe-Needle U-100 (TRUEPLUS INSULIN SYRINGE) 31G X 5/16" 1 ML MISC Use to inject insulin once daily Dx E11.8 100 each 3  . isosorbide mononitrate (IMDUR)  60 MG 24 hr tablet Take 1 tablet (60 mg total) by mouth daily. 90 tablet 3  . levETIRAcetam (KEPPRA) 500 MG tablet Take 500 mg by mouth 2 (two) times daily.    . nitroGLYCERIN (NITROSTAT) 0.4 MG SL tablet Place 1 tablet (0.4 mg total) under the tongue every 5 (five) minutes as needed for chest pain. 25 tablet 2  . Omega-3 1000 MG CAPS Take 2 capsules by mouth daily.     . Oxcarbazepine (TRILEPTAL) 300 MG tablet Take 1/2 tablet twice a day for 1 week, then increase to 1 tablet twice a day for 1 week, then increase to 2 tablets twice a day and continue 120 tablet 5  . pantoprazole (PROTONIX) 40 MG tablet Take 1 tablet (40 mg total) by mouth daily. 30 tablet 5  . sacubitril-valsartan (ENTRESTO) 49-51 MG Take 1 tablet by mouth 2 (two) times daily. 60 tablet 6  . sertraline (ZOLOFT) 100 MG tablet Take 1 tablet (100 mg total) by mouth daily. 30 tablet 3  . sodium bicarbonate 650 MG tablet Take 650 mg by mouth 2 (two) times daily.     No current facility-administered medications for this visit.    Allergies:   Penicillins    ROS:  Please see the history of present illness.   Otherwise, review of systems are positive for ***.   All other systems are reviewed and negative.    PHYSICAL EXAM: VS:   There were no vitals taken for this visit. , BMI There is no height or weight on file to calculate BMI. Marland KitchenGENERAL:  Well appearing NECK:  No jugular venous distention, waveform within normal limits, carotid upstroke brisk and symmetric, no bruits, no thyromegaly LUNGS:  Clear to auscultation bilaterally CHEST:  Unremarkable HEART:  PMI not displaced or sustained,S1 and S2 within normal limits, no S3, no S4, no clicks, no rubs, *** murmurs ABD:  Flat, positive bowel sounds normal in frequency in pitch, no bruits, no rebound, no guarding, no midline pulsatile mass, no hepatomegaly, no splenomegaly EXT:  2 plus pulses throughout, no edema, no cyanosis no clubbing    ***GENERAL:  Well appearing NECK:  No jugular venous distention, waveform within normal limits, carotid upstroke brisk and symmetric, no bruits, no thyromegaly LUNGS:  Clear to auscultation bilaterally CHEST:  Unremarkable HEART:  PMI not displaced or sustained,S1 and S2 within normal limits, no S3, no S4, no clicks, no rubs, no murmurs ABD:  Flat, positive bowel sounds normal in frequency in pitch, no bruits, no rebound, no guarding, no midline pulsatile mass, no hepatomegaly, no splenomegaly EXT:  2 plus pulses throughout, no edema, no cyanosis no clubbing  EKG:  EKG is *** ordered today. ***    Recent Labs: 07/01/2019: Magnesium 1.5 05/26/2020: ALT 13; BUN 12; Creatinine, Ser 1.77; Hemoglobin 14.2; Platelets 252; Potassium 5.4; Sodium 138; TSH 3.290    Lipid Panel    Component Value Date/Time   CHOL 161 05/26/2020 1425   TRIG 239 (H) 05/26/2020 1425   HDL 39 (L) 05/26/2020 1425   CHOLHDL 4.1 05/26/2020 1425   CHOLHDL 2.1 07/06/2016 0534   VLDL 19 07/06/2016 0534   LDLCALC 82 05/26/2020 1425      Wt Readings from Last 3 Encounters:  05/26/20 160 lb 9.6 oz (72.8 kg)  05/18/20 156 lb 4 oz (70.9 kg)  05/10/20 159 lb (72.1 kg)      Other studies Reviewed: Additional studies/ records that were reviewed today  include: *** Review of the above records  demonstrates:  Please see elsewhere in the note.     ASSESSMENT AND PLAN:  CAD:  ***     The patient has no new sypmtoms.  No further cardiovascular testing is indicated.  We will continue with aggressive risk reduction and meds as listed.  Hypertension: Blood pressure is *** at target but I am going to treat this also in the context of managing his cardiomyopathy.  Hyperlipidemia:    Total cholesterol is *** 94. This was in May. No change in therapy.  DM II:     Last A1c was *** that I can see was 8.0 in April. I will defer toMorgan, Birder Robson, FNP.  I will suggest consideration of a SGLT2i or GLP1ra but will defer to the PCP.  CKD stage III:   *** Creatinine was most recently 1.80 which was lower than previous. His potassium has normalized.  Ischemic cardiomyopathy:  ***  His ejection fraction was improved to 45%. I would like again to try the [facility] since he is tolerating Cozaar. I would be 49/52. However, we will keep a very close eye on his potassium. I have ordered potassium in 1 week after starting the [facility] and then 2 weeks after that.  Dizziness:  ***   He is no longer having dizziness. I will follow up with the results of the monitor.  Covid education: He had a long discussion about this. He and his wife think they will both get the vaccine.  Current medicines are reviewed at length with the patient today.  The patient does not have concerns regarding medicines.  The following changes have been made:   ***  Labs/ tests ordered today include:  ***  No orders of the defined types were placed in this encounter.    Disposition:   FU with me in ***  months.     Signed, Rollene Rotunda, MD  06/01/2020 9:46 PM    [facility]

## 2020-06-02 ENCOUNTER — Telehealth (HOSPITAL_COMMUNITY): Payer: Self-pay | Admitting: Cardiology

## 2020-06-02 NOTE — Telephone Encounter (Signed)
Just an FYI. We have made several attempts to contact this patient including sending a letter to schedule or reschedule their Myoview. We will be removing the patient from the echo/NUC WQ.  05/19/20 NO SHOWED- MAILED LETTER [facility]      Thank you

## 2020-06-03 ENCOUNTER — Ambulatory Visit: Payer: Medicaid Other | Admitting: Cardiology

## 2020-06-03 DIAGNOSIS — E118 Type 2 diabetes mellitus with unspecified complications: Secondary | ICD-10-CM

## 2020-06-03 DIAGNOSIS — N1831 Chronic kidney disease, stage 3a: Secondary | ICD-10-CM

## 2020-06-03 DIAGNOSIS — I2510 Atherosclerotic heart disease of native coronary artery without angina pectoris: Secondary | ICD-10-CM

## 2020-06-03 DIAGNOSIS — I10 Essential (primary) hypertension: Secondary | ICD-10-CM

## 2020-06-03 DIAGNOSIS — E785 Hyperlipidemia, unspecified: Secondary | ICD-10-CM

## 2020-06-08 ENCOUNTER — Other Ambulatory Visit: Payer: Self-pay | Admitting: Internal Medicine

## 2020-06-08 ENCOUNTER — Other Ambulatory Visit (HOSPITAL_COMMUNITY): Payer: Self-pay | Admitting: Internal Medicine

## 2020-06-08 DIAGNOSIS — N183 Chronic kidney disease, stage 3 unspecified: Secondary | ICD-10-CM

## 2020-06-09 ENCOUNTER — Encounter: Payer: Self-pay | Admitting: Family Medicine

## 2020-06-09 ENCOUNTER — Ambulatory Visit: Payer: Medicaid Other | Admitting: Family Medicine

## 2020-06-09 ENCOUNTER — Other Ambulatory Visit: Payer: Self-pay

## 2020-06-09 VITALS — BP 152/85 | HR 90 | Temp 98.4°F | Ht 67.0 in | Wt 160.2 lb

## 2020-06-09 DIAGNOSIS — F331 Major depressive disorder, recurrent, moderate: Secondary | ICD-10-CM

## 2020-06-09 DIAGNOSIS — E875 Hyperkalemia: Secondary | ICD-10-CM

## 2020-06-09 NOTE — Patient Instructions (Signed)

## 2020-06-09 NOTE — Progress Notes (Signed)
Acute Office Visit  Subjective:    Patient ID: [name], male    DOB: 27-Oct-1970, 50 y.o.   MRN: [id number]  Chief Complaint  Patient presents with  . Depression    HPI  Patient is in today for depression. [name] reports that he is feeling better with the increase in Zoloft 2 weeks ago. He is requesting paperwork today that would allow him to keep his dog with him as an emotional support.    He also needs to have his potassium level checked. His cardiologist has had him hold his Entresto due to hyperkalemia. He potassium was still a little elevated 2 weeks ago.     Past Medical History:  Diagnosis Date  . Alcohol abuse   . CAD (coronary artery disease), native coronary artery    a. 01/2009 s/p Anterior MI and thrombectomy with BMS to mid LAD;  b. 06/2016 Cath/PCI: LM nl, LAD 39md, LCX nl, OM1/2/3 nl, RDA 668mInitial attempt @ PCI failed followed by successful CTO LAD PCI and DES (2.5x38 Synergy)-->rec for indefinte DAPT.  . CKD (chronic kidney disease), stage III ([facility]  . Essential hypertension   . History of nonadherence to medical treatment   . Hyperlipidemia   . Ischemic cardiomyopathy    a. EF 40% by nuc in 2012;  b. 06/2016 Echo: EF 40-45%, apicalanteroseptal and apical AK, Gr2 DD, mod Ca2+ Ao annulus, mild MR.  . Marland Kitchenlurred speech - Chronic   . Type 2 diabetes mellitus ([facility]    Past Surgical History:  Procedure Laterality Date  . CORONARY CTO INTERVENTION N/A 07/11/2016   Procedure: Coronary CTO Intervention;  Surgeon: Peter M JoMartiniqueMD;  [facility];  Service: Cardiovascular;  Laterality: N/A;  . CORONARY STENT INTERVENTION N/A 07/09/2016   Procedure: Coronary Stent Intervention;  Surgeon: JoLorretta HarpMD;  [facility];  Service: Cardiovascular;  Laterality: N/A;  . CORONARY STENT INTERVENTION N/A 04/24/2019   Procedure: CORONARY STENT INTERVENTION;  Surgeon: HaLeonie ManMD;  [facility];  Service:  Cardiovascular;  Laterality: N/A;  . Knee arthroscopic surgery Right   . LEFT HEART CATH AND CORONARY ANGIOGRAPHY N/A 07/05/2016   Procedure: Left Heart Cath and Coronary Angiography;  Surgeon: HeBelva CromeMD;  [facility];  Service: Cardiovascular;  Laterality: N/A;  . LEFT HEART CATH AND CORONARY ANGIOGRAPHY N/A 04/24/2019   Procedure: LEFT HEART CATH AND CORONARY ANGIOGRAPHY;  Surgeon: HaLeonie ManMD;  [facility];  Service: Cardiovascular;  Laterality: N/A;    Family History  Problem Relation Age of Onset  . Diabetes Mellitus II Other   . Heart disease Mother     Social History   Socioeconomic History  . Marital status: Married    Spouse name: Not on file  . Number of children: Not on file  . Years of education: Not on file  . Highest education level: Not on file  Occupational History  . Not on file  Tobacco Use  . Smoking status: Never Smoker  . Smokeless tobacco: Never Used  Vaping Use  . Vaping Use: Never used  Substance and Sexual Activity  . Alcohol use: No    Alcohol/week: 1.0 standard drink    Types: 1 Cans of beer per week  . Drug use: No  . Sexual activity: Yes  Other Topics Concern  . Not on file  Social History Narrative   Married with  one child   Lives in [place], [place]   [place]   One story home   Drinks some caffeine (Green Tea)   Social Determinants of Health   Financial Resource Strain: Not on file  Food Insecurity: Not on file  Transportation Needs: Not on file  Physical Activity: Not on file  Stress: Not on file  Social Connections: Not on file  Intimate Partner Violence: Not on file    Outpatient Medications Prior to Visit  Medication Sig Dispense Refill  . aspirin 81 MG chewable tablet Chew 1 tablet (81 mg total) by mouth daily with breakfast. 120 tablet 2  . atorvastatin (LIPITOR) 80 MG tablet Take 1 tablet (80 mg total) by mouth daily. (Patient taking differently: Take 80 mg by mouth at  bedtime.) 30 tablet 11  . carvedilol (COREG) 3.125 MG tablet Take 1 tablet (3.125 mg total) by mouth 2 (two) times daily with a meal. 180 tablet 3  . clopidogrel (PLAVIX) 75 MG tablet Take 1 tablet (75 mg total) by mouth daily. 30 tablet 6  . gabapentin (NEURONTIN) 300 MG capsule Take 1 capsule (300 mg total) by mouth 3 (three) times daily. 90 capsule 3  . insulin aspart (NOVOLOG) 100 UNIT/ML FlexPen Inject 0-10 Units into the skin See admin instructions. insulin aspart (novoLOG) injection 0-10 Units 0-10 Units Subcutaneous, 3 times daily with meals CBG < 70: Implement Hypoglycemia Standing Orders and refer to Hypoglycemia Standing Orders sidebar report  CBG 70 - 120: 0 unit CBG 121 - 150: 0 unit  CBG 151 - 200: 1 unit CBG 201 - 250: 2 units CBG 251 - 300: 4 units CBG 301 - 350: 6 units  CBG 351 - 400: 8 units  CBG > 400: 10 units 15 mL 11  . insulin glargine (LANTUS) 100 UNIT/ML injection Inject 0.14 mLs (14 Units total) into the skin at bedtime. 10 mL 4  . Insulin Pen Needle (PEN NEEDLES) 32G X 5 MM MISC Use with Novolog Flexpen as directed 100 each 3  . Insulin Syringe-Needle U-100 (TRUEPLUS INSULIN SYRINGE) 31G X 5/16" 1 ML MISC Use to inject insulin once daily Dx E11.8 100 each 3  . isosorbide mononitrate (IMDUR) 60 MG 24 hr tablet Take 1 tablet (60 mg total) by mouth daily. 90 tablet 3  . levETIRAcetam (KEPPRA) 500 MG tablet Take 500 mg by mouth 2 (two) times daily.    . nitroGLYCERIN (NITROSTAT) 0.4 MG SL tablet Place 1 tablet (0.4 mg total) under the tongue every 5 (five) minutes as needed for chest pain. 25 tablet 2  . Omega-3 1000 MG CAPS Take 2 capsules by mouth daily.     . Oxcarbazepine (TRILEPTAL) 300 MG tablet Take 1/2 tablet twice a day for 1 week, then increase to 1 tablet twice a day for 1 week, then increase to 2 tablets twice a day and continue 120 tablet 5  . pantoprazole (PROTONIX) 40 MG tablet Take 1 tablet (40 mg total) by mouth daily. 30 tablet 5  . sacubitril-valsartan  (ENTRESTO) 49-51 MG Take 1 tablet by mouth 2 (two) times daily. 60 tablet 6  . sertraline (ZOLOFT) 100 MG tablet Take 1 tablet (100 mg total) by mouth daily. 30 tablet 3  . sodium bicarbonate 650 MG tablet Take 650 mg by mouth 2 (two) times daily.     No facility-administered medications prior to visit.    Allergies  Allergen Reactions  . Penicillins Rash         Review of  Systems As per HPI.     Objective:    Physical Exam Vitals and nursing note reviewed.  Constitutional:      General: He is not in acute distress.    Appearance: He is not ill-appearing, toxic-appearing or diaphoretic.  HENT:     Head: Normocephalic and atraumatic.  Pulmonary:     Effort: Pulmonary effort is normal. No respiratory distress.  Musculoskeletal:     Right lower leg: No edema.     Left lower leg: No edema.  Skin:    General: Skin is warm and dry.  Neurological:     Mental Status: He is alert and oriented to person, place, and time. Mental status is at baseline.  Psychiatric:        Mood and Affect: Mood normal.        Behavior: Behavior normal.        Thought Content: Thought content normal.        Judgment: Judgment normal.     BP (!) 152/85   Pulse 90   Temp 98.4 F (36.9 C) (Temporal)   Ht 5' 7" (1.702 m)   Wt 160 lb 4 oz (72.7 kg)   BMI 25.10 kg/m  Wt Readings from Last 3 Encounters:  06/09/20 160 lb 4 oz (72.7 kg)  05/26/20 160 lb 9.6 oz (72.8 kg)  05/18/20 156 lb 4 oz (70.9 kg)    Health Maintenance Due  Topic Date Due  . PNEUMOCOCCAL POLYSACCHARIDE VACCINE AGE 70-64 HIGH RISK  Never done  . COVID-19 Vaccine (1) Never done  . OPHTHALMOLOGY EXAM  Never done  . COLONOSCOPY (Pts 45-61yr Insurance coverage will need to be confirmed)  Never done    There are no preventive care reminders to display for this patient.   Lab Results  Component Value Date   TSH 3.290 05/26/2020   Lab Results  Component Value Date   WBC 7.4 05/26/2020   HGB 14.2 05/26/2020   HCT 41.3  05/26/2020   MCV 82 05/26/2020   PLT 252 05/26/2020   Lab Results  Component Value Date   NA 138 06/09/2020   K 5.0 06/09/2020   CO2 20 06/09/2020   GLUCOSE 399 (H) 06/09/2020   BUN 11 06/09/2020   CREATININE 1.86 (H) 06/09/2020   BILITOT 0.4 05/26/2020   ALKPHOS 104 05/26/2020   AST 19 05/26/2020   ALT 13 05/26/2020   PROT 6.6 05/26/2020   ALBUMIN 4.3 05/26/2020   CALCIUM 8.5 (L) 06/09/2020   ANIONGAP 7 09/12/2019   Lab Results  Component Value Date   CHOL 161 05/26/2020   Lab Results  Component Value Date   HDL 39 (L) 05/26/2020   Lab Results  Component Value Date   LDLCALC 82 05/26/2020   Lab Results  Component Value Date   TRIG 239 (H) 05/26/2020   Lab Results  Component Value Date   CHOLHDL 4.1 05/26/2020   Lab Results  Component Value Date   HGBA1C 9.2 (H) 05/26/2020       Assessment & Plan:   KAprilwas seen today for depression.  Diagnoses and all orders for this visit:  Moderate episode of recurrent major depressive disorder ([place] Well controlled on current regimen. Note given for emotional support dog.  Serum potassium elevated Labs pending as below. Reminded patient to schedule follow up with cardiologist.  -     BMP8+EGFR  The patient indicates understanding of these issues and agrees with the plan.  TGwenlyn Perking FNP

## 2020-06-10 LAB — BMP8+EGFR
BUN/Creatinine Ratio: 6 — ABNORMAL LOW (ref 9–20)
BUN: 11 mg/dL (ref 6–24)
CO2: 20 mmol/L (ref 20–29)
Calcium: 8.5 mg/dL — ABNORMAL LOW (ref 8.7–10.2)
Chloride: 99 mmol/L (ref 96–106)
Creatinine, Ser: 1.86 mg/dL — ABNORMAL HIGH (ref 0.76–1.27)
Glucose: 399 mg/dL — ABNORMAL HIGH (ref 65–99)
Potassium: 5 mmol/L (ref 3.5–5.2)
Sodium: 138 mmol/L (ref 134–144)
eGFR: 44 mL/min/{1.73_m2} — ABNORMAL LOW (ref >59)

## 2020-06-15 ENCOUNTER — Ambulatory Visit (HOSPITAL_COMMUNITY): Payer: Medicaid Other

## 2020-06-15 ENCOUNTER — Encounter (HOSPITAL_COMMUNITY): Payer: Self-pay

## 2020-06-16 ENCOUNTER — Other Ambulatory Visit: Payer: Self-pay | Admitting: Internal Medicine

## 2020-06-21 ENCOUNTER — Ambulatory Visit (HOSPITAL_COMMUNITY): Admission: RE | Admit: 2020-06-21 | Payer: Medicaid Other | Source: Ambulatory Visit

## 2020-06-23 ENCOUNTER — Ambulatory Visit (INDEPENDENT_AMBULATORY_CARE_PROVIDER_SITE_OTHER): Payer: Medicaid Other | Admitting: Family Medicine

## 2020-06-23 ENCOUNTER — Encounter: Payer: Self-pay | Admitting: Family Medicine

## 2020-06-23 DIAGNOSIS — J069 Acute upper respiratory infection, unspecified: Secondary | ICD-10-CM | POA: Diagnosis not present

## 2020-06-23 MED ORDER — BENZONATATE 100 MG PO CAPS: 100.0000 mg | ORAL_CAPSULE | Freq: Three times a day (TID) | ORAL | 0 refills | Status: DC | PRN

## 2020-06-23 MED ORDER — PREDNISONE 20 MG PO TABS
20.0000 mg | ORAL_TABLET | Freq: Every day | ORAL | 0 refills | Status: AC
Start: 2020-06-23 — End: 2020-06-28

## 2020-06-23 NOTE — Progress Notes (Signed)
   Virtual Visit  Note Due to COVID-19 pandemic this visit was conducted virtually. This visit type was conducted due to national recommendations for restrictions regarding the COVID-19 Pandemic (e.g. social distancing, sheltering in place) in an effort to limit this patient's exposure and mitigate transmission in our community. All issues noted in this document were discussed and addressed.  A physical exam was not performed with this format.  I connected with [name] on 06/23/20 at [phone] by phone and verified that I am speaking with the correct person using two identifiers. [name] is currently located at home and his wife is currently with him during the visit. The provider, Gabriel Earing, FNP is located in their office at time of visit.  I discussed the limitations, risks, security and privacy concerns of performing an evaluation and management service by phone and the availability of in person appointments. I also discussed with the patient that there may be a patient responsible charge related to this service. The patient expressed understanding and agreed to proceed.  CC: cough  History and Present Illness:  HPI  [name] reports cough, head and chest congestion, sore throat and body aches x 3 days. Denies fever or chills. Denies chest pain or shortness of breath. He does report that his ribs are sore from coughing. He has tried mucinex with little improvement. He has a home Covid test but has not taken this yet. He has not been vaccinated. He reports that his blood sugars have been stable and he is staying well hydrated.   ROS  As per HPI.    Observations/Objective: Alert and oriented x 3. Able to speak in full sentences without difficulty.   Assessment and Plan: [name] was seen today for cough.  Diagnoses and all orders for this visit:  Viral URI with cough Declined Covid PCR. Patient will complete home Covid test instead and call with results. Tessalon perles for cough.  Prednisone burst for possible costochondritis. Mucinex and nasal saline for congestion. Stay well hydration. Aware of when to seek emergency care. Return to office for new or worsening symptoms, or if symptoms persist.  -     benzonatate (TESSALON PERLES) 100 MG capsule; Take 1 capsule (100 mg total) by mouth 3 (three) times daily as needed for cough. -     predniSONE (DELTASONE) 20 MG tablet; Take 1 tablet (20 mg total) by mouth daily with breakfast for 5 days.   Follow Up Instructions: As needed.     I discussed the assessment and treatment plan with the patient. The patient was provided an opportunity to ask questions and all were answered. The patient agreed with the plan and demonstrated an understanding of the instructions.   The patient was advised to call back or seek an in-person evaluation if the symptoms worsen or if the condition fails to improve as anticipated.  The above assessment and management plan was discussed with the patient. The patient verbalized understanding of and has agreed to the management plan. Patient is aware to call the clinic if symptoms persist or worsen. Patient is aware when to return to the clinic for a follow-up visit. Patient educated on when it is appropriate to go to the emergency department.   Time call ended:  [id number]  I provided 12 minutes of  phone time during this encounter.    Gabriel Earing, FNP

## 2020-06-27 DIAGNOSIS — E119 Type 2 diabetes mellitus without complications: Secondary | ICD-10-CM | POA: Diagnosis not present

## 2020-06-27 DIAGNOSIS — J209 Acute bronchitis, unspecified: Secondary | ICD-10-CM | POA: Diagnosis not present

## 2020-06-27 DIAGNOSIS — F418 Other specified anxiety disorders: Secondary | ICD-10-CM | POA: Diagnosis not present

## 2020-06-27 DIAGNOSIS — E785 Hyperlipidemia, unspecified: Secondary | ICD-10-CM | POA: Diagnosis not present

## 2020-06-27 DIAGNOSIS — B9689 Other specified bacterial agents as the cause of diseases classified elsewhere: Secondary | ICD-10-CM | POA: Diagnosis not present

## 2020-06-27 DIAGNOSIS — J329 Chronic sinusitis, unspecified: Secondary | ICD-10-CM | POA: Diagnosis not present

## 2020-06-27 DIAGNOSIS — I119 Hypertensive heart disease without heart failure: Secondary | ICD-10-CM | POA: Diagnosis not present

## 2020-06-27 DIAGNOSIS — R059 Cough, unspecified: Secondary | ICD-10-CM | POA: Diagnosis not present

## 2020-06-27 DIAGNOSIS — J208 Acute bronchitis due to other specified organisms: Secondary | ICD-10-CM | POA: Diagnosis not present

## 2020-07-19 ENCOUNTER — Ambulatory Visit (INDEPENDENT_AMBULATORY_CARE_PROVIDER_SITE_OTHER): Payer: Medicaid Other | Admitting: Nurse Practitioner

## 2020-07-19 DIAGNOSIS — J069 Acute upper respiratory infection, unspecified: Secondary | ICD-10-CM

## 2020-07-19 MED ORDER — BENZONATATE 100 MG PO CAPS: 100.0000 mg | ORAL_CAPSULE | Freq: Three times a day (TID) | ORAL | 0 refills | Status: DC | PRN

## 2020-07-19 MED ORDER — FLUTICASONE PROPIONATE 50 MCG/ACT NA SUSP: 2.0000 | Freq: Every day | NASAL | 6 refills | Status: DC

## 2020-07-19 MED ORDER — CETIRIZINE HCL 10 MG PO TABS: 10.0000 mg | ORAL_TABLET | Freq: Every day | ORAL | 1 refills | Status: DC

## 2020-07-19 NOTE — Patient Instructions (Signed)
Viral Respiratory Infection A viral respiratory infection is an illness that affects parts of the body that are used for breathing. These include the lungs, nose, and throat. It is caused by a germ called a virus. Some examples of this kind of infection are:  A cold.  The flu (influenza).  A respiratory syncytial virus (RSV) infection. A person who gets this illness may have the following symptoms:  A stuffy or runny nose.  Yellow or green fluid in the nose.  A cough.  Sneezing.  Tiredness (fatigue).  Achy muscles.  A sore throat.  Sweating or chills.  A fever.  A headache. Follow these instructions at home: Managing pain and congestion  Take over-the-counter and prescription medicines only as told by your doctor.  If you have a sore throat, gargle with salt water. Do this 3-4 times per day or as needed. To make a salt-water mixture, dissolve -1 tsp of salt in 1 cup of warm water. Make sure that all the salt dissolves.  Use nose drops made from salt water. This helps with stuffiness (congestion). It also helps soften the skin around your nose.  Drink enough fluid to keep your pee (urine) pale yellow. General instructions  Rest as much as possible.  Do not drink alcohol.  Do not use any products that have nicotine or tobacco, such as cigarettes and e-cigarettes. If you need help quitting, ask your doctor.  Keep all follow-up visits as told by your doctor. This is important.   How is this prevented?  Get a flu shot every year. Ask your doctor when you should get your flu shot.  Do not let other people get your germs. If you are sick: ? Stay home from work or school. ? Wash your hands with soap and water often. Wash your hands after you cough or sneeze. If soap and water are not available, use hand sanitizer.  Avoid contact with people who are sick during cold and flu season. This is in fall and winter.   Get help if:  Your symptoms last for 10 days or  longer.  Your symptoms get worse over time.  You have a fever.  You have very bad pain in your face or forehead.  Parts of your jaw or neck become very swollen. Get help right away if:  You feel pain or pressure in your chest.  You have shortness of breath.  You faint or feel like you will faint.  You keep throwing up (vomiting).  You feel confused. Summary  A viral respiratory infection is an illness that affects parts of the body that are used for breathing.  Examples of this illness include a cold, the flu, and respiratory syncytial virus (RSV) infection.  The infection can cause a runny nose, cough, sneezing, sore throat, and fever.  Follow what your doctor tells you about taking medicines, drinking lots of fluid, washing your hands, resting at home, and avoiding people who are sick. This information is not intended to replace advice given to you by your health care provider. Make sure you discuss any questions you have with your health care provider. Document Revised: 03/20/2018 Document Reviewed: 04/22/2017 Elsevier Patient Education  2021 Elsevier Inc.  

## 2020-07-19 NOTE — Progress Notes (Signed)
   Virtual Visit  Note Due to COVID-19 pandemic this visit was conducted virtually. This visit type was conducted due to national recommendations for restrictions regarding the COVID-19 Pandemic (e.g. social distancing, sheltering in place) in an effort to limit this patient's exposure and mitigate transmission in our community. All issues noted in this document were discussed and addressed.  A physical exam was not performed with this format.  I connected with [name] on 07/21/20 at  9:30 AM by telephone and verified that I am speaking with the correct person using two identifiers. [name] is currently located at home during visit. The provider, Daryll Drown, NP is located in their office at time of visit.  I discussed the limitations, risks, security and privacy concerns of performing an evaluation and management service by telephone and the availability of in person appointments. I also discussed with the patient that there may be a patient responsible charge related to this service. The patient expressed understanding and agreed to proceed.   History and Present Illness:  URI  This is a new problem. Episode onset: In the past 24 hours. The problem has been gradually worsening. There has been no fever. Associated symptoms include congestion and coughing. Pertinent negatives include no chest pain, headaches or nausea. He has tried nothing for the symptoms.      Review of Systems  HENT: Positive for congestion.   Respiratory: Positive for cough.   Cardiovascular: Negative for chest pain.  Gastrointestinal: Negative for nausea.  Neurological: Negative for headaches.     Observations/Objective: Televisit patient did not sound to be in distress.  Assessment and Plan: Take medication as prescribed Increase hydration Cool mist humidifier Flonase/saline nasal spray Benzonatate for cough Zyrtec   Follow Up Instructions: Follow-up with worsening or unresolved symptoms     I discussed the assessment and treatment plan with the patient. The patient was provided an opportunity to ask questions and all were answered. The patient agreed with the plan and demonstrated an understanding of the instructions.   The patient was advised to call back or seek an in-person evaluation if the symptoms worsen or if the condition fails to improve as anticipated.  The above assessment and management plan was discussed with the patient. The patient verbalized understanding of and has agreed to the management plan. Patient is aware to call the clinic if symptoms persist or worsen. Patient is aware when to return to the clinic for a follow-up visit. Patient educated on when it is appropriate to go to the emergency department.   Time call ended: 9:40 AM  I provided 10 minutes of  non face-to-face time during this encounter.    Daryll Drown, NP

## 2020-07-20 ENCOUNTER — Ambulatory Visit (HOSPITAL_COMMUNITY): Admission: RE | Admit: 2020-07-20 | Payer: Medicaid Other | Source: Ambulatory Visit

## 2020-07-21 ENCOUNTER — Encounter: Payer: Self-pay | Admitting: Nurse Practitioner

## 2020-07-21 DIAGNOSIS — J069 Acute upper respiratory infection, unspecified: Secondary | ICD-10-CM | POA: Insufficient documentation

## 2020-07-21 NOTE — Assessment & Plan Note (Signed)
Symptoms worsen with seasonal allergies. Take medication as prescribed Increase hydration Cool mist humidifier Flonase/saline nasal spray Benzonatate for cough Zyrtec

## 2020-08-29 ENCOUNTER — Ambulatory Visit: Payer: Medicaid Other | Admitting: Family Medicine

## 2020-08-29 ENCOUNTER — Encounter: Payer: Self-pay | Admitting: Family Medicine

## 2020-08-29 ENCOUNTER — Other Ambulatory Visit: Payer: Self-pay

## 2020-08-29 VITALS — BP 149/92 | HR 96 | Temp 97.5°F | Ht 67.0 in | Wt 155.5 lb

## 2020-08-29 DIAGNOSIS — F419 Anxiety disorder, unspecified: Secondary | ICD-10-CM | POA: Diagnosis not present

## 2020-08-29 DIAGNOSIS — I10 Essential (primary) hypertension: Secondary | ICD-10-CM

## 2020-08-29 MED ORDER — CITALOPRAM HYDROBROMIDE 10 MG PO TABS: 10.0000 mg | ORAL_TABLET | Freq: Every day | ORAL | 1 refills | Status: DC

## 2020-08-29 NOTE — Patient Instructions (Signed)

## 2020-08-29 NOTE — Progress Notes (Signed)
Established Patient Office Visit  Subjective:  Patient ID: [name], male    DOB: 1971/01/24  Age: 50 y.o. MRN: [id number]  CC:  Chief Complaint  Patient presents with  . Anxiety    HPI [name] presents for anxiety. He has been irritable. He did stop taking his zoloft 2 weeks ago. It was making him sleepy. He does reports headaches since stopping zoloft. He is anxious when the grandchildren are in the home and are being loud.   GAD 7 : Generalized Anxiety Score 08/29/2020  Nervous, Anxious, on Edge 1  Control/stop worrying 0  Worry too much - different things 0  Trouble relaxing 0  Restless 0  Easily annoyed or irritable 1  Afraid - awful might happen 0  Total GAD 7 Score 2  Anxiety Difficulty Somewhat difficult    Depression screen [facility] 2/9 08/29/2020 09/19/2016 07/27/2016  Decreased Interest 0 0 0  Down, Depressed, Hopeless 0 0 0  PHQ - 2 Score 0 0 0  Altered sleeping 0 - -  Tired, decreased energy 0 - -  Change in appetite 0 - -  Feeling bad or failure about yourself  0 - -  Trouble concentrating 0 - -  Moving slowly or fidgety/restless 0 - -  Suicidal thoughts 0 - -  PHQ-9 Score 0 - -  Difficult doing work/chores Not difficult at all - -     Past Medical History:  Diagnosis Date  . Alcohol abuse   . CAD (coronary artery disease), native coronary artery    a. 01/2009 s/p Anterior MI and thrombectomy with BMS to mid LAD;  b. 06/2016 Cath/PCI: LM nl, LAD 31md, LCX nl, OM1/2/3 nl, RDA 633mInitial attempt @ PCI failed followed by successful CTO LAD PCI and DES (2.5x38 Synergy)-->rec for indefinte DAPT.  . CKD (chronic kidney disease), stage III [facility]  . Essential hypertension   . History of nonadherence to medical treatment   . Hyperlipidemia   . Ischemic cardiomyopathy    a. EF 40% by nuc in 2012;  b. 06/2016 Echo: EF 40-45%, apicalanteroseptal and apical AK, Gr2 DD, mod Ca2+ Ao annulus, mild MR.  . Marland Kitchenlurred speech - Chronic   . Type 2 diabetes mellitus ([facility]     Past Surgical History:  Procedure Laterality Date  . CORONARY CTO INTERVENTION N/A 07/11/2016   Procedure: Coronary CTO Intervention;  Surgeon: Peter M JoMartiniqueMD;  [facility];  Service: Cardiovascular;  Laterality: N/A;  . CORONARY STENT INTERVENTION N/A 07/09/2016   Procedure: Coronary Stent Intervention;  Surgeon: JoLorretta HarpMD;  [facility];  Service: Cardiovascular;  Laterality: N/A;  . CORONARY STENT INTERVENTION N/A 04/24/2019   Procedure: CORONARY STENT INTERVENTION;  Surgeon: HaLeonie ManMD;  [facility];  Service: Cardiovascular;  Laterality: N/A;  . Knee arthroscopic surgery Right   . LEFT HEART CATH AND CORONARY ANGIOGRAPHY N/A 07/05/2016   Procedure: Left Heart Cath and Coronary Angiography;  Surgeon: HeBelva CromeMD;  [facility];  Service: Cardiovascular;  Laterality: N/A;  . LEFT HEART CATH AND CORONARY ANGIOGRAPHY N/A 04/24/2019   Procedure: LEFT HEART CATH AND CORONARY ANGIOGRAPHY;  Surgeon: HaLeonie ManMD;  [facility];  Service: Cardiovascular;  Laterality: N/A;    Family History  Problem Relation Age of Onset  . Diabetes Mellitus II Other   . Heart disease Mother     Social History  Socioeconomic History  . Marital status: Married    Spouse name: Not on file  . Number of children: Not on file  . Years of education: Not on file  . Highest education level: Not on file  Occupational History  . Not on file  Tobacco Use  . Smoking status: Never Smoker  . Smokeless tobacco: Never Used  Vaping Use  . Vaping Use: Never used  Substance and Sexual Activity  . Alcohol use: No    Alcohol/week: 1.0 standard drink    Types: 1 Cans of beer per week  . Drug use: No  . Sexual activity: Yes  Other Topics Concern  . Not on file  Social History Narrative   Married with one child   Lives in [place], [place]   Right Handed   One story home   Drinks some caffeine  (Green Tea)   Social Determinants of Health   Financial Resource Strain: Not on file  Food Insecurity: Not on file  Transportation Needs: Not on file  Physical Activity: Not on file  Stress: Not on file  Social Connections: Not on file  Intimate Partner Violence: Not on file    Outpatient Medications Prior to Visit  Medication Sig Dispense Refill  . aspirin 81 MG chewable tablet Chew 1 tablet (81 mg total) by mouth daily with breakfast. 120 tablet 2  . atorvastatin (LIPITOR) 80 MG tablet Take 1 tablet (80 mg total) by mouth daily. (Patient taking differently: Take 80 mg by mouth at bedtime.) 30 tablet 11  . carvedilol (COREG) 3.125 MG tablet Take 1 tablet (3.125 mg total) by mouth 2 (two) times daily with a meal. 180 tablet 3  . cetirizine (ZYRTEC ALLERGY) 10 MG tablet Take 1 tablet (10 mg total) by mouth daily. 60 tablet 1  . clopidogrel (PLAVIX) 75 MG tablet Take 1 tablet (75 mg total) by mouth daily. 30 tablet 6  . fluticasone (FLONASE) 50 MCG/ACT nasal spray Place 2 sprays into both nostrils daily. 16 g 6  . gabapentin (NEURONTIN) 300 MG capsule Take 1 capsule (300 mg total) by mouth 3 (three) times daily. 90 capsule 3  . insulin aspart (NOVOLOG) 100 UNIT/ML FlexPen Inject 0-10 Units into the skin See admin instructions. insulin aspart (novoLOG) injection 0-10 Units 0-10 Units Subcutaneous, 3 times daily with meals CBG < 70: Implement Hypoglycemia Standing Orders and refer to Hypoglycemia Standing Orders sidebar report  CBG 70 - 120: 0 unit CBG 121 - 150: 0 unit  CBG 151 - 200: 1 unit CBG 201 - 250: 2 units CBG 251 - 300: 4 units CBG 301 - 350: 6 units  CBG 351 - 400: 8 units  CBG > 400: 10 units 15 mL 11  . insulin glargine (LANTUS) 100 UNIT/ML injection Inject 0.14 mLs (14 Units total) into the skin at bedtime. 10 mL 4  . Insulin Pen Needle (PEN NEEDLES) 32G X 5 MM MISC Use with Novolog Flexpen as directed 100 each 3  . Insulin Syringe-Needle U-100 (TRUEPLUS INSULIN SYRINGE) 31G X  5/16" 1 ML MISC Use to inject insulin once daily Dx E11.8 100 each 3  . isosorbide mononitrate (IMDUR) 60 MG 24 hr tablet Take 1 tablet (60 mg total) by mouth daily. 90 tablet 3  . levETIRAcetam (KEPPRA) 500 MG tablet Take 500 mg by mouth 2 (two) times daily.    . nitroGLYCERIN (NITROSTAT) 0.4 MG SL tablet Place 1 tablet (0.4 mg total) under the tongue every 5 (five) minutes as needed for  chest pain. 25 tablet 2  . pantoprazole (PROTONIX) 40 MG tablet Take 1 tablet (40 mg total) by mouth daily. 30 tablet 5  . sodium bicarbonate 650 MG tablet Take 650 mg by mouth 2 (two) times daily.    . Oxcarbazepine (TRILEPTAL) 300 MG tablet Take 1/2 tablet twice a day for 1 week, then increase to 1 tablet twice a day for 1 week, then increase to 2 tablets twice a day and continue (Patient not taking: Reported on 08/29/2020) 120 tablet 5  . benzonatate (TESSALON PERLES) 100 MG capsule Take 1 capsule (100 mg total) by mouth 3 (three) times daily as needed for cough. 20 capsule 0  . Omega-3 1000 MG CAPS Take 2 capsules by mouth daily.     . sacubitril-valsartan (ENTRESTO) 49-51 MG Take 1 tablet by mouth 2 (two) times daily. 60 tablet 6  . sertraline (ZOLOFT) 100 MG tablet Take 1 tablet (100 mg total) by mouth daily. 30 tablet 3   No facility-administered medications prior to visit.    Allergies  Allergen Reactions  . Penicillins Rash         ROS Review of Systems As per HPI.    Objective:    Physical Exam Vitals and nursing note reviewed.  Constitutional:      General: He is not in acute distress.    Appearance: Normal appearance. He is not ill-appearing, toxic-appearing or diaphoretic.  HENT:     Head: Normocephalic and atraumatic.  Cardiovascular:     Rate and Rhythm: Normal rate and regular rhythm.     Heart sounds: Normal heart sounds. No murmur heard.   Pulmonary:     Effort: Pulmonary effort is normal. No respiratory distress.     Breath sounds: Normal breath sounds. No wheezing.   Skin:    General: Skin is warm and dry.  Neurological:     Mental Status: He is alert and oriented to person, place, and time. Mental status is at baseline.  Psychiatric:        Mood and Affect: Mood normal.        Behavior: Behavior normal.     BP (!) 149/92   Pulse 96   Temp (!) 97.5 F (36.4 C) (Oral)   Ht 5' 7"  (1.702 m)   Wt 155 lb 8 oz (70.5 kg)   BMI 24.35 kg/m  Wt Readings from Last 3 Encounters:  08/29/20 155 lb 8 oz (70.5 kg)  06/09/20 160 lb 4 oz (72.7 kg)  05/26/20 160 lb 9.6 oz (72.8 kg)     Health Maintenance Due  Topic Date Due  . OPHTHALMOLOGY EXAM  03/07/2019    There are no preventive care reminders to display for this patient.  Lab Results  Component Value Date   TSH 3.290 05/26/2020   Lab Results  Component Value Date   WBC 7.4 05/26/2020   HGB 14.2 05/26/2020   HCT 41.3 05/26/2020   MCV 82 05/26/2020   PLT 252 05/26/2020   Lab Results  Component Value Date   NA 138 06/09/2020   K 5.0 06/09/2020   CO2 20 06/09/2020   GLUCOSE 399 (H) 06/09/2020   BUN 11 06/09/2020   CREATININE 1.86 (H) 06/09/2020   BILITOT 0.4 05/26/2020   ALKPHOS 104 05/26/2020   AST 19 05/26/2020   ALT 13 05/26/2020   PROT 6.6 05/26/2020   ALBUMIN 4.3 05/26/2020   CALCIUM 8.5 (L) 06/09/2020   ANIONGAP 7 09/12/2019   EGFR 44 (L) 06/09/2020  Lab Results  Component Value Date   CHOL 161 05/26/2020   Lab Results  Component Value Date   HDL 39 (L) 05/26/2020   Lab Results  Component Value Date   LDLCALC 82 05/26/2020   Lab Results  Component Value Date   TRIG 239 (H) 05/26/2020   Lab Results  Component Value Date   CHOLHDL 4.1 05/26/2020   Lab Results  Component Value Date   HGBA1C 9.2 (H) 05/26/2020      Assessment & Plan:   Zen was seen today for anxiety.  Diagnoses and all orders for this visit:  Anxiety Was previously on zoloft but patient stopped abruptly 2 weeks ago. Reports increased anxiety since. Stopped zoloft because it made  him sleepy. Will try celexa as below. Discussed with patient to take medication daily, do not abruptly stop taking this medication.  -     citalopram (CELEXA) 10 MG tablet; Take 1 tablet (10 mg total) by mouth daily.  Primary hypertension Not at goal today. Asymptomatic. Continue current regimen. Will recheck at chronic follow up appointment next week.    Follow-up: Return for keep scheduled appointment on 6/17.   The patient indicates understanding of these issues and agrees with the plan.    [name], FNP

## 2020-09-01 ENCOUNTER — Telehealth: Payer: Self-pay | Admitting: Cardiology

## 2020-09-01 NOTE — Telephone Encounter (Signed)
Attempted to contact patient on number provided. Unable to reach patient and unable to leave message due to VM being full. Will try again at a later time.

## 2020-09-01 NOTE — Telephone Encounter (Signed)
Pt c/o of Chest Pain: STAT if CP now or developed within 24 hours  1. Are you having CP right now? no  2. Are you experiencing any other symptoms (ex. SOB, nausea, vomiting, sweating)? no  3. How long have you been experiencing CP?  Only lasted for about 10 min this morning then it resolved on its own   4. Is your CP continuous or coming and going? Comes and goes. Today was the first time in a while that he had chest pain   5. Have you taken Nitroglycerin? no ?

## 2020-09-02 NOTE — Telephone Encounter (Signed)
Unable to reach pt or leave a message mailbox is full 

## 2020-09-06 IMAGING — CT CT HEAD W/O CM
3 series · 16 of 47 positions shown, 19 images · non-contrast
Comparison: 07/01/2019

CLINICAL DATA: Syncope with headache and some chest pain. Possible
stroke.

EXAM:
CT HEAD WITHOUT CONTRAST
TECHNIQUE: Contiguous axial images were obtained from the base of the skull
through the vertex without intravenous contrast.

[Series 2: head w o · axial · 0.39mm/px · z∈[+1305,+1430]mm · 10 of 31 slices shown, 13 images]
[im 3/31  brain]
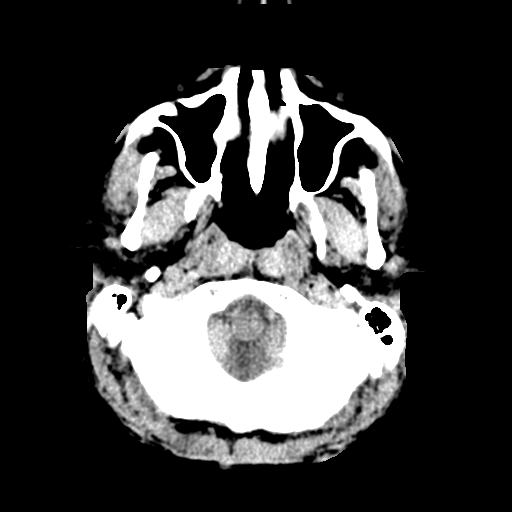
[im 3/31  bone]
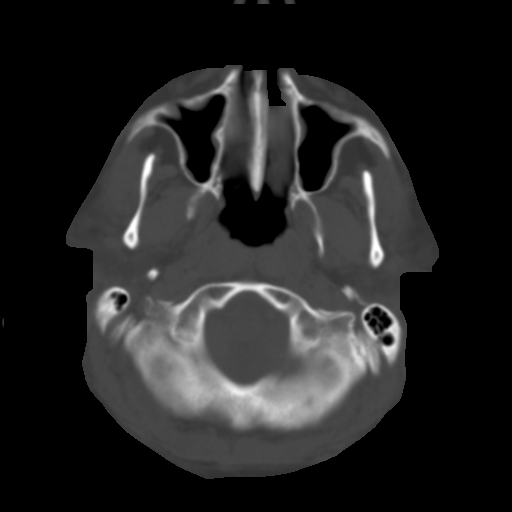
[im 6/31  brain]
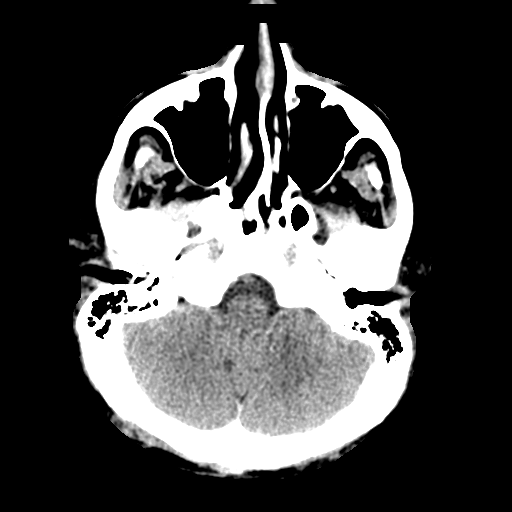
[im 9/31  brain]
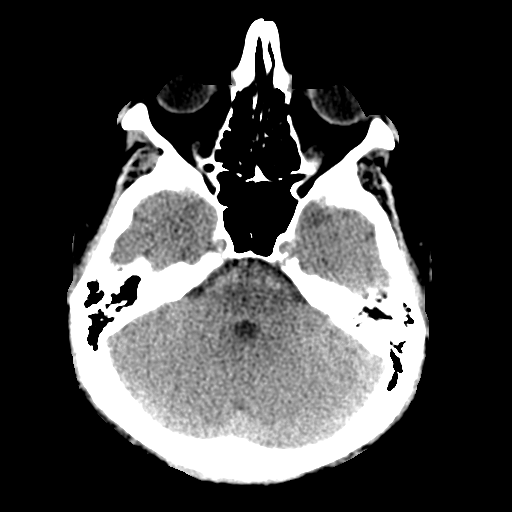
[im 11/31  brain]
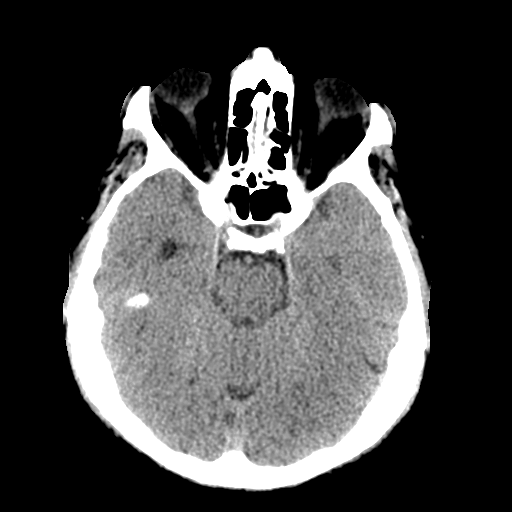
[im 14/31  brain]
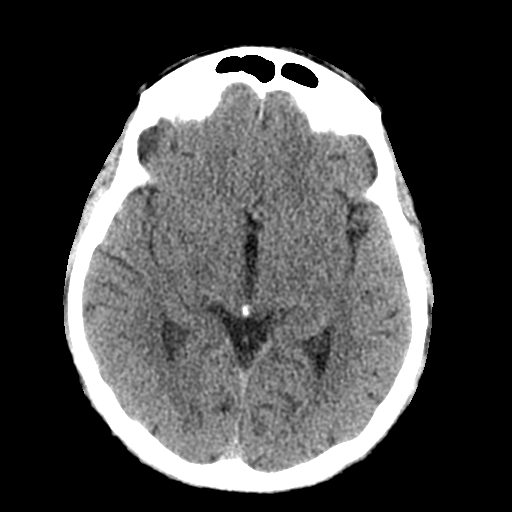
[im 14/31  bone]
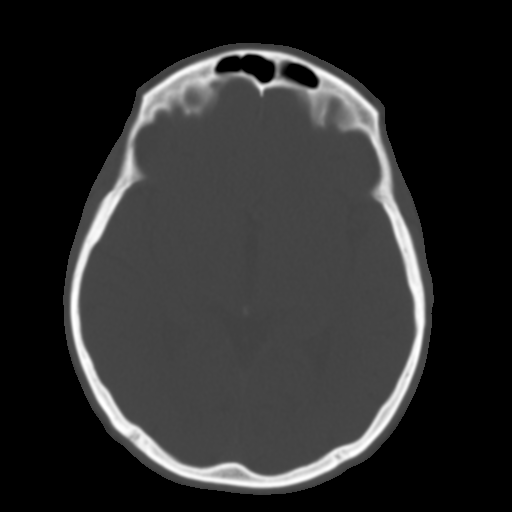
[im 17/31  brain]
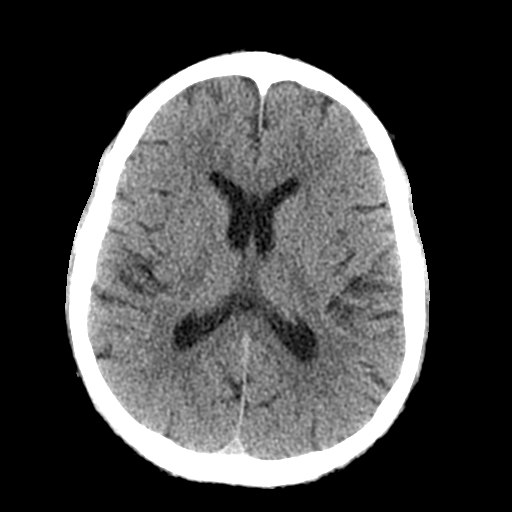
[im 20/31  brain]
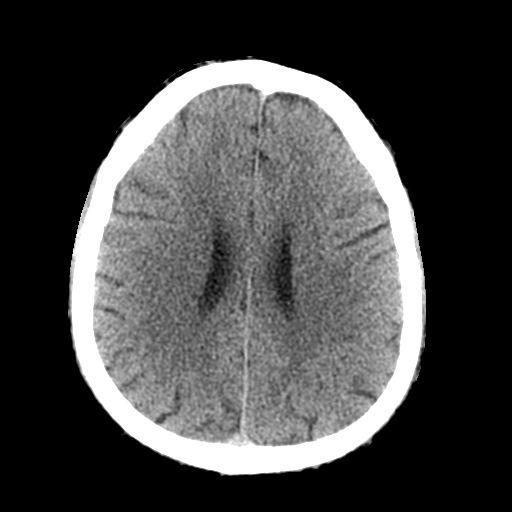
[im 23/31  brain]
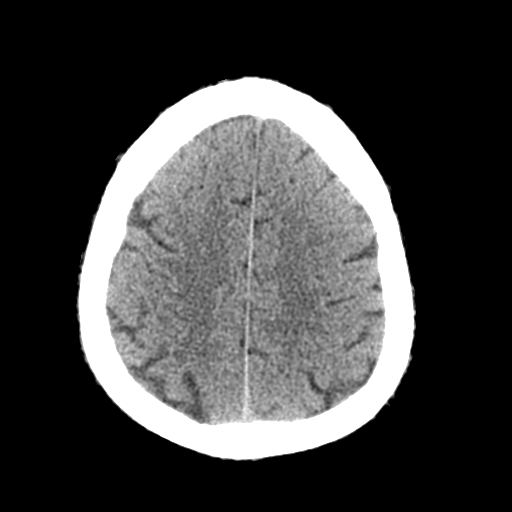
[im 25/31  brain]
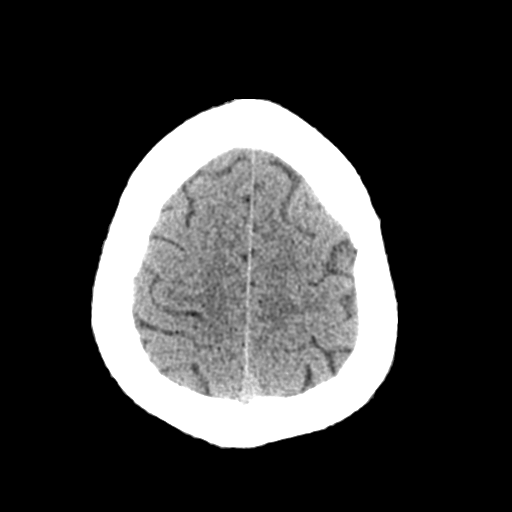
[im 25/31  bone]
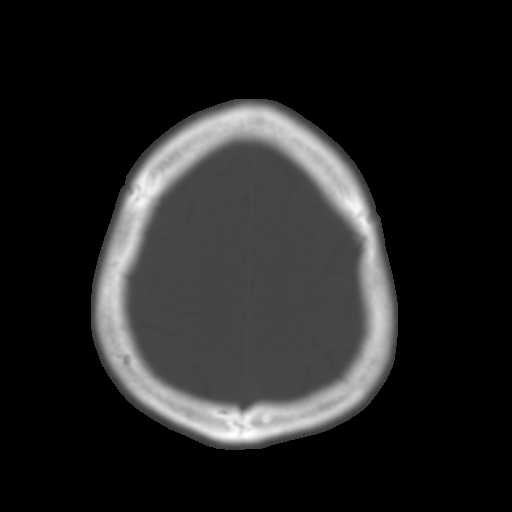
[im 28/31  brain]
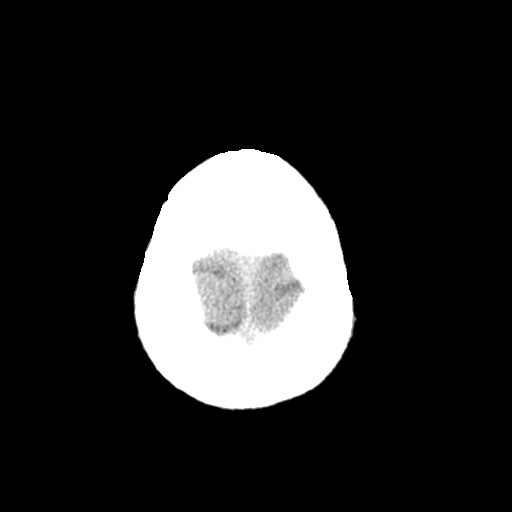

[Series 4: coronal soft · coronal · 0.32mm/px · 3 of 67 slices shown]
[im 23/67  brain]
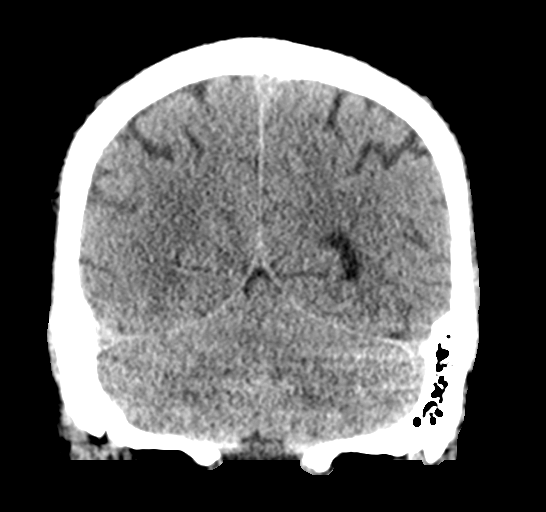
[im 30/67  brain]
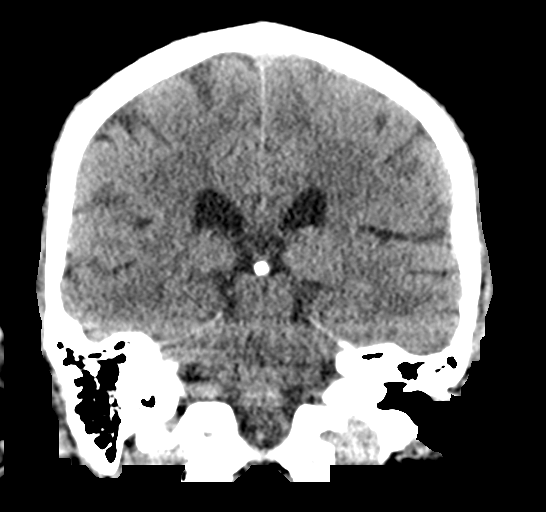
[im 37/67  brain]
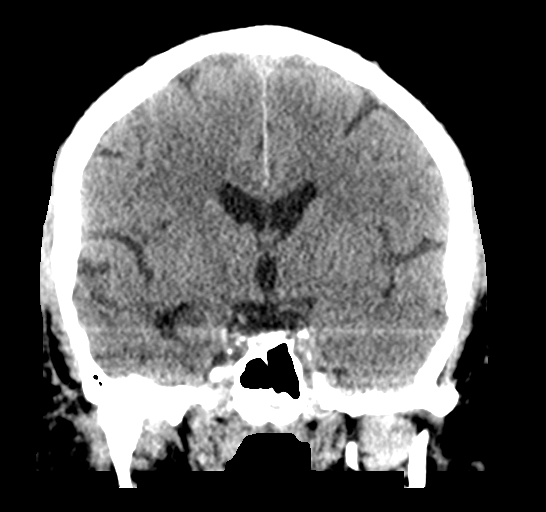

[Series 5: sagittal soft · sagittal · 0.34mm/px · 3 of 53 slices shown]
[im 18/53  brain]
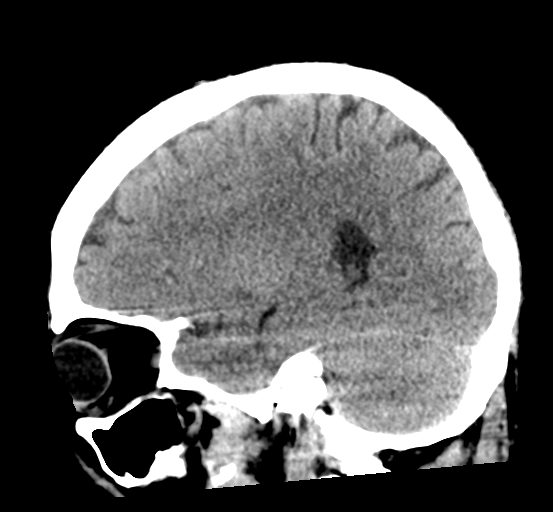
[im 27/53  brain]
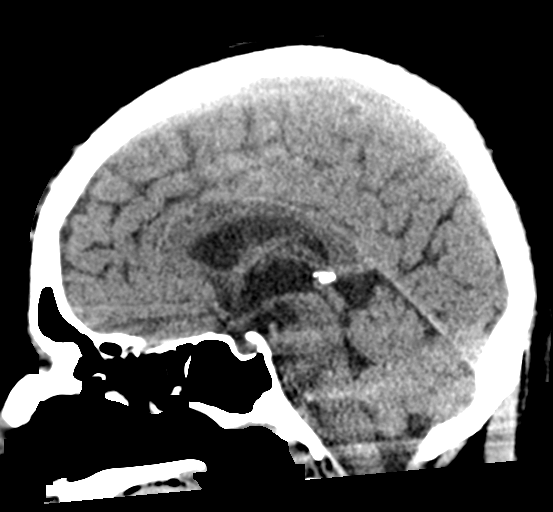
[im 35/53  brain]
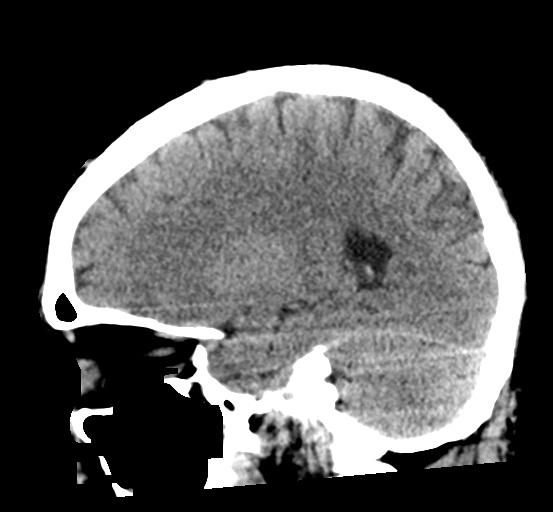

[16 of 47 positions shown; findings below may reference images not displayed]

FINDINGS: Brain: Ventricles, cisterns and other CSF spaces are normal. There
is no mass, mass effect, shift of midline structures or acute
hemorrhage. No evidence of acute infarction.

Vascular: No hyperdense vessel or unexpected calcification.

Skull: Normal. Negative for fracture or focal lesion.

Sinuses/Orbits: No acute finding.

Other: None.
IMPRESSION: No acute findings.

## 2020-09-08 DIAGNOSIS — Z880 Allergy status to penicillin: Secondary | ICD-10-CM | POA: Diagnosis not present

## 2020-09-08 DIAGNOSIS — I10 Essential (primary) hypertension: Secondary | ICD-10-CM | POA: Diagnosis not present

## 2020-09-08 DIAGNOSIS — Z79899 Other long term (current) drug therapy: Secondary | ICD-10-CM | POA: Diagnosis not present

## 2020-09-08 DIAGNOSIS — Z7982 Long term (current) use of aspirin: Secondary | ICD-10-CM | POA: Diagnosis not present

## 2020-09-08 DIAGNOSIS — H53141 Visual discomfort, right eye: Secondary | ICD-10-CM | POA: Diagnosis not present

## 2020-09-08 DIAGNOSIS — T675XXA Heat exhaustion, unspecified, initial encounter: Secondary | ICD-10-CM | POA: Diagnosis not present

## 2020-09-08 DIAGNOSIS — E119 Type 2 diabetes mellitus without complications: Secondary | ICD-10-CM | POA: Diagnosis not present

## 2020-09-08 DIAGNOSIS — R519 Headache, unspecified: Secondary | ICD-10-CM | POA: Diagnosis not present

## 2020-09-09 ENCOUNTER — Ambulatory Visit: Payer: Medicaid Other | Admitting: Family Medicine

## 2020-09-19 ENCOUNTER — Ambulatory Visit: Payer: Medicaid Other | Admitting: Family Medicine

## 2020-09-22 ENCOUNTER — Ambulatory Visit: Payer: Medicaid Other | Admitting: Cardiology

## 2020-11-11 ENCOUNTER — Ambulatory Visit: Payer: Medicaid Other | Admitting: Family Medicine

## 2020-11-11 ENCOUNTER — Encounter: Payer: Self-pay | Admitting: Family Medicine

## 2020-11-11 ENCOUNTER — Other Ambulatory Visit: Payer: Self-pay | Admitting: Family Medicine

## 2020-11-11 ENCOUNTER — Other Ambulatory Visit: Payer: Self-pay

## 2020-11-11 VITALS — BP 144/84 | HR 96 | Temp 98.8°F | Ht 67.0 in | Wt 151.1 lb

## 2020-11-11 DIAGNOSIS — F419 Anxiety disorder, unspecified: Secondary | ICD-10-CM

## 2020-11-11 DIAGNOSIS — E118 Type 2 diabetes mellitus with unspecified complications: Secondary | ICD-10-CM | POA: Diagnosis not present

## 2020-11-11 DIAGNOSIS — N183 Chronic kidney disease, stage 3 unspecified: Secondary | ICD-10-CM

## 2020-11-11 DIAGNOSIS — E1159 Type 2 diabetes mellitus with other circulatory complications: Secondary | ICD-10-CM | POA: Diagnosis not present

## 2020-11-11 DIAGNOSIS — L0292 Furuncle, unspecified: Secondary | ICD-10-CM

## 2020-11-11 DIAGNOSIS — L739 Follicular disorder, unspecified: Secondary | ICD-10-CM

## 2020-11-11 DIAGNOSIS — I152 Hypertension secondary to endocrine disorders: Secondary | ICD-10-CM

## 2020-11-11 DIAGNOSIS — E1169 Type 2 diabetes mellitus with other specified complication: Secondary | ICD-10-CM | POA: Insufficient documentation

## 2020-11-11 DIAGNOSIS — E785 Hyperlipidemia, unspecified: Secondary | ICD-10-CM | POA: Diagnosis not present

## 2020-11-11 DIAGNOSIS — F3342 Major depressive disorder, recurrent, in full remission: Secondary | ICD-10-CM

## 2020-11-11 DIAGNOSIS — Z794 Long term (current) use of insulin: Secondary | ICD-10-CM | POA: Diagnosis not present

## 2020-11-11 LAB — BAYER DCA HB A1C WAIVED: HB A1C (BAYER DCA - WAIVED): 8.3 % — ABNORMAL HIGH (ref <7.0)

## 2020-11-11 MED ORDER — FREESTYLE LIBRE 2 SENSOR MISC: 1.0000 | 2 refills | Status: AC

## 2020-11-11 MED ORDER — BLOOD GLUCOSE METER KIT: PACK | 0 refills | Status: DC

## 2020-11-11 MED ORDER — CEPHALEXIN 500 MG PO CAPS: 500.0000 mg | ORAL_CAPSULE | Freq: Two times a day (BID) | ORAL | 0 refills | Status: DC

## 2020-11-11 MED ORDER — FREESTYLE LIBRE 2 READER DEVI: 1.0000 | Freq: Every day | 0 refills | Status: AC

## 2020-11-11 MED ORDER — ACCU-CHEK GUIDE VI STRP: ORAL_STRIP | 3 refills | Status: AC

## 2020-11-11 MED ORDER — ACCU-CHEK SOFTCLIX LANCETS MISC: 3 refills | Status: AC

## 2020-11-11 MED ORDER — LISINOPRIL 5 MG PO TABS: 5.0000 mg | ORAL_TABLET | Freq: Every day | ORAL | 3 refills | Status: DC

## 2020-11-11 NOTE — Progress Notes (Signed)
Acute Office Visit  Subjective:    Patient ID: [name], male    DOB: 22-May-1970, 50 y.o.   MRN: [id number]  Chief Complaint  Patient presents with   Recurrent Skin Infections   Rash    HPI Patient is in today for boil on his buttocks. He reports 3 small boils on the right side for 2-3 days. They are tender and red. He denies fever or drain. He also has a rash on his back. This started after working in the heat a few days ago.  It is itchy. He has not used any remedies.   He is overdue for a chronic follow up today. He hasn't been checking his blood sugars because the meter is missing. He would like to have a libre if possible. His wife has one and it has been helpful for her. He went to an eye doctor in [place]. They have diagnosed him with glaucoma in his right eye. He has been following for treatment for this. He has not been checking his blood pressure at home. He will see cardiology next week.   Depression screen [facility] 2/9 11/11/2020 08/29/2020 09/19/2016  Decreased Interest 0 0 0  Down, Depressed, Hopeless 0 0 0  PHQ - 2 Score 0 0 0  Altered sleeping 0 0 -  Tired, decreased energy 1 0 -  Change in appetite 0 0 -  Feeling bad or failure about yourself  0 0 -  Trouble concentrating 0 0 -  Moving slowly or fidgety/restless 0 0 -  Suicidal thoughts 0 0 -  PHQ-9 Score 1 0 -  Difficult doing work/chores Somewhat difficult Not difficult at all -   GAD 7 : Generalized Anxiety Score 11/11/2020 08/29/2020  Nervous, Anxious, on Edge 0 1  Control/stop worrying 0 0  Worry too much - different things 0 0  Trouble relaxing 0 0  Restless 0 0  Easily annoyed or irritable 1 1  Afraid - awful might happen 0 0  Total GAD 7 Score 1 2  Anxiety Difficulty Somewhat difficult Somewhat difficult     Past Medical History:  Diagnosis Date   Alcohol abuse    CAD (coronary artery disease), native coronary artery    a. 01/2009 s/p Anterior MI and thrombectomy with BMS to mid LAD;  b. 06/2016  Cath/PCI: LM nl, LAD 33md, LCX nl, OM1/2/3 nl, RDA 685mInitial attempt @ PCI failed followed by successful CTO LAD PCI and DES (2.5x38 Synergy)-->rec for indefinte DAPT.   CKD (chronic kidney disease), stage III ([facility]   Essential hypertension    History of nonadherence to medical treatment    Hyperlipidemia    Ischemic cardiomyopathy    a. EF 40% by nuc in 2012;  b. 06/2016 Echo: EF 40-45%, apicalanteroseptal and apical AK, Gr2 DD, mod Ca2+ Ao annulus, mild MR.   Slurred speech - Chronic    Type 2 diabetes mellitus ([facility]    Past Surgical History:  Procedure Laterality Date   CORONARY CTO INTERVENTION N/A 07/11/2016   Procedure: Coronary CTO Intervention;  Surgeon: Peter M JoMartiniqueMD;  [facility];  Service: Cardiovascular;  Laterality: N/A;   CORONARY STENT INTERVENTION N/A 07/09/2016   Procedure: Coronary Stent Intervention;  Surgeon: JoLorretta HarpMD;  [facility];  Service: Cardiovascular;  Laterality: N/A;   CORONARY STENT INTERVENTION N/A 04/24/2019   Procedure: CORONARY STENT INTERVENTION;  Surgeon: HaLeonie ManMD;  [facility];  Service: Cardiovascular;  Laterality: N/A;   Knee arthroscopic surgery Right    LEFT HEART CATH AND CORONARY ANGIOGRAPHY N/A 07/05/2016   Procedure: Left Heart Cath and Coronary Angiography;  Surgeon: Belva Crome, MD;  Location: [facility];  Service: Cardiovascular;  Laterality: N/A;   LEFT HEART CATH AND CORONARY ANGIOGRAPHY N/A 04/24/2019   Procedure: LEFT HEART CATH AND CORONARY ANGIOGRAPHY;  Surgeon: Leonie Man, MD;  Location: [place] [facility];  Service: Cardiovascular;  Laterality: N/A;    Family History  Problem Relation Age of Onset   Diabetes Mellitus II Other    Heart disease Mother     Social History   Socioeconomic History   Marital status: Married    Spouse name: Not on file   Number of children: Not on file   Years of education: Not on file   Highest  education level: Not on file  Occupational History   Not on file  Tobacco Use   Smoking status: Never   Smokeless tobacco: Never  Vaping Use   Vaping Use: Never used  Substance and Sexual Activity   Alcohol use: No    Alcohol/week: 1.0 standard drink    Types: 1 Cans of beer per week   Drug use: No   Sexual activity: Yes  Other Topics Concern   Not on file  Social History Narrative   Married with one child   Lives in [place], [place]   Right Handed   One story home   Drinks some caffeine (Green Tea)   Social Determinants of Health   Financial Resource Strain: Not on file  Food Insecurity: Not on file  Transportation Needs: Not on file  Physical Activity: Not on file  Stress: Not on file  Social Connections: Not on file  Intimate Partner Violence: Not on file    Outpatient Medications Prior to Visit  Medication Sig Dispense Refill   aspirin 81 MG chewable tablet Chew 1 tablet (81 mg total) by mouth daily with breakfast. 120 tablet 2   atorvastatin (LIPITOR) 80 MG tablet Take 1 tablet (80 mg total) by mouth daily. (Patient taking differently: Take 80 mg by mouth at bedtime.) 30 tablet 11   carvedilol (COREG) 3.125 MG tablet Take 1 tablet (3.125 mg total) by mouth 2 (two) times daily with a meal. 180 tablet 3   cetirizine (ZYRTEC ALLERGY) 10 MG tablet Take 1 tablet (10 mg total) by mouth daily. 60 tablet 1   citalopram (CELEXA) 10 MG tablet Take 1 tablet (10 mg total) by mouth daily. 30 tablet 1   clopidogrel (PLAVIX) 75 MG tablet Take 1 tablet (75 mg total) by mouth daily. 30 tablet 6   fluticasone (FLONASE) 50 MCG/ACT nasal spray Place 2 sprays into both nostrils daily. 16 g 6   gabapentin (NEURONTIN) 300 MG capsule Take 1 capsule (300 mg total) by mouth 3 (three) times daily. 90 capsule 3   insulin aspart (NOVOLOG) 100 UNIT/ML FlexPen Inject 0-10 Units into the skin See admin instructions. insulin aspart (novoLOG) injection 0-10 Units 0-10 Units Subcutaneous, 3 times daily with  meals CBG < 70: Implement Hypoglycemia Standing Orders and refer to Hypoglycemia Standing Orders sidebar report  CBG 70 - 120: 0 unit CBG 121 - 150: 0 unit  CBG 151 - 200: 1 unit CBG 201 - 250: 2 units CBG 251 - 300: 4 units CBG 301 - 350: 6 units  CBG 351 - 400: 8 units  CBG > 400: 10 units 15  mL 11   insulin glargine (LANTUS) 100 UNIT/ML injection Inject 0.14 mLs (14 Units total) into the skin at bedtime. 10 mL 4   Insulin Pen Needle (PEN NEEDLES) 32G X 5 MM MISC Use with Novolog Flexpen as directed 100 each 3   Insulin Syringe-Needle U-100 (TRUEPLUS INSULIN SYRINGE) 31G X 5/16" 1 ML MISC Use to inject insulin once daily Dx E11.8 100 each 3   isosorbide mononitrate (IMDUR) 60 MG 24 hr tablet Take 1 tablet (60 mg total) by mouth daily. 90 tablet 3   levETIRAcetam (KEPPRA) 500 MG tablet Take 500 mg by mouth 2 (two) times daily.     nitroGLYCERIN (NITROSTAT) 0.4 MG SL tablet Place 1 tablet (0.4 mg total) under the tongue every 5 (five) minutes as needed for chest pain. 25 tablet 2   Oxcarbazepine (TRILEPTAL) 300 MG tablet Take 1/2 tablet twice a day for 1 week, then increase to 1 tablet twice a day for 1 week, then increase to 2 tablets twice a day and continue 120 tablet 5   pantoprazole (PROTONIX) 40 MG tablet Take 1 tablet (40 mg total) by mouth daily. 30 tablet 5   sodium bicarbonate 650 MG tablet Take 650 mg by mouth 2 (two) times daily.     No facility-administered medications prior to visit.    Allergies  Allergen Reactions   Penicillins Rash         Review of Systems Negative unless specially indicated above in HPI.     Objective:    Physical Exam Vitals and nursing note reviewed.  Constitutional:      General: He is not in acute distress.    Appearance: He is not ill-appearing, toxic-appearing or diaphoretic.  Cardiovascular:     Rate and Rhythm: Normal rate and regular rhythm.  Pulmonary:     Effort: Pulmonary effort is normal. No respiratory distress.     Breath sounds:  Normal breath sounds. No stridor. No wheezing, rhonchi or rales.  Musculoskeletal:     Right lower leg: No edema.     Left lower leg: No edema.  Skin:    General: Skin is warm and dry.     Comments: Folliculitis present to back.   Three 0.5 cm boils present to right buttocks. Mild tenderness and erythema. No drainage or fluctuance noted.   Neurological:     Mental Status: He is alert and oriented to person, place, and time. Mental status is at baseline.  Psychiatric:        Mood and Affect: Mood normal.        Behavior: Behavior normal.    BP (!) 144/84   Pulse 96   Temp 98.8 F (37.1 C) (Temporal)   Ht 5' 7"  (1.702 m)   Wt 151 lb 2 oz (68.5 kg)   BMI 23.67 kg/m  Wt Readings from Last 3 Encounters:  11/11/20 151 lb 2 oz (68.5 kg)  08/29/20 155 lb 8 oz (70.5 kg)  06/09/20 160 lb 4 oz (72.7 kg)    Health Maintenance Due  Topic Date Due   URINE MICROALBUMIN  09/18/2017   OPHTHALMOLOGY EXAM  03/07/2019   INFLUENZA VACCINE  10/24/2020    There are no preventive care reminders to display for this patient.   Lab Results  Component Value Date   TSH 3.290 05/26/2020   Lab Results  Component Value Date   WBC 7.4 05/26/2020   HGB 14.2 05/26/2020   HCT 41.3 05/26/2020   MCV 82 05/26/2020  PLT 252 05/26/2020   Lab Results  Component Value Date   NA 138 06/09/2020   K 5.0 06/09/2020   CO2 20 06/09/2020   GLUCOSE 399 (H) 06/09/2020   BUN 11 06/09/2020   CREATININE 1.86 (H) 06/09/2020   BILITOT 0.4 05/26/2020   ALKPHOS 104 05/26/2020   AST 19 05/26/2020   ALT 13 05/26/2020   PROT 6.6 05/26/2020   ALBUMIN 4.3 05/26/2020   CALCIUM 8.5 (L) 06/09/2020   ANIONGAP 7 09/12/2019   EGFR 44 (L) 06/09/2020   Lab Results  Component Value Date   CHOL 161 05/26/2020   Lab Results  Component Value Date   HDL 39 (L) 05/26/2020   Lab Results  Component Value Date   LDLCALC 82 05/26/2020   Lab Results  Component Value Date   TRIG 239 (H) 05/26/2020   Lab Results   Component Value Date   CHOLHDL 4.1 05/26/2020   Lab Results  Component Value Date   HGBA1C 9.2 (H) 05/26/2020       Assessment & Plan:   Bellamy was seen today for recurrent skin infections and rash.  Diagnoses and all orders for this visit:  Type 2 diabetes mellitus with complication, with long-term current use of insulin (HCC) A1c and labs pending. Last a1c was uncontrolled at 9 five months ago. Will adjust medications as needed pending results. He has not been checking his blood sugars as he as lost his meter. New meter ordered today along with libre. Will request records for eye exam that was completed at [facility] in [place]. On statin and aspirin. Added lisinopril today due to elevated BP.  -     blood glucose meter kit and supplies; Dispense based on patient and insurance preference. Use up to four times daily as directed. (FOR ICD-10 E10.9, E11.9). -     CBC with Differential/Platelet -     CMP14+EGFR -     Lipid panel -     Bayer DCA Hb A1c Waived -     Microalbumin / creatinine urine ratio -     Continuous Blood Gluc Receiver (FREESTYLE LIBRE 2 READER) DEVI; 1 each by Does not apply route daily. -     Continuous Blood Gluc Sensor (FREESTYLE LIBRE 2 SENSOR) MISC; 1 each by Does not apply route every 14 (fourteen) days.  Hypertension associated with diabetes ([place]) Not at goal. Lisinopril added as below. Labs pending.  -     lisinopril (ZESTRIL) 5 MG tablet; Take 1 tablet (5 mg total) by mouth daily. -     CBC with Differential/Platelet -     CMP14+EGFR  Hyperlipidemia associated with type 2 diabetes mellitus ([place]) On statin. Labs pending.  -     CBC with Differential/Platelet -     CMP14+EGFR -     Lipid panel  Chronic kidney disease (CKD), active medical management without dialysis, stage 3 (moderate) (HCC) Labs pending.  -     CBC with Differential/Platelet -     CMP14+EGFR  Recurrent major depressive disorder, in full remission ([place]) Well controlled on current  regimen.   Anxiety Well controlled on current regimen.   Folliculitis Keflex as below, if improvement, will try antifungal.  -     cephALEXin (KEFLEX) 500 MG capsule; Take 1 capsule (500 mg total) by mouth 2 (two) times daily.  Furuncle Keflex as below. Warm compresses.  -     cephALEXin (KEFLEX) 500 MG capsule; Take 1 capsule (500 mg total) by mouth 2 (two)  times daily.   Return in about 3 months (around 02/11/2021) for chronic follow up. Sooner for new or worsening symptoms.   The patient indicates understanding of these issues and agrees with the plan.  [name], FNP

## 2020-11-11 NOTE — Patient Instructions (Signed)
Folliculitis Folliculitis is inflammation of the hair follicles. Folliculitis most commonly occurs on the scalp, thighs, legs, back, and buttocks. However, it can occur anywhere on the body. What are the causes? This condition may be caused by: A bacterial infection (common). A fungal infection. A viral infection. Contact with certain chemicals, especially oils and tars. Shaving or waxing. Greasy ointments or creams applied to the skin. Long-lasting folliculitis and folliculitis that keeps coming back may be caused by bacteria. This bacteria can live anywhere on your skin and is often found in the nostrils. What increases the risk? You are more likely to develop this condition if you have: A weakened immune system. Diabetes. Obesity. What are the signs or symptoms? Symptoms of this condition include: Redness. Soreness. Swelling. Itching. Small white or yellow, pus-filled, itchy spots (pustules) that appear over a reddened area. If there is an infection that goes deep into the follicle, these may develop into a boil (furuncle). A group of closely packed boils (carbuncle). These tend to form in hairy, sweaty areas of the body. How is this diagnosed? This condition is diagnosed with a skin exam. To find what is causing the condition, your health care provider may take a sample of one of the pustules or boils for testing in a lab. How is this treated? This condition may be treated by: Applying warm compresses to the affected areas. Taking an antibiotic medicine or applying an antibiotic medicine to the skin. Applying or bathing with an antiseptic solution. Taking an over-the-counter medicine to help with itching. Having a procedure to drain any pustules or boils. This may be done if a pustule or boil contains a lot of pus or fluid. Having laser hair removal. This may be done to treat long-lasting folliculitis. Follow these instructions at home: Managing pain and swelling  If  directed, apply heat to the affected area as often as told by your health care provider. Use the heat source that your health care provider recommends, such as a moist heat pack or a heating pad. Place a towel between your skin and the heat source. Leave the heat on for 20-30 minutes. Remove the heat if your skin turns bright red. This is especially important if you are unable to feel pain, heat, or cold. You may have a greater risk of getting burned. General instructions If you were prescribed an antibiotic medicine, take it or apply it as told by your health care provider. Do not stop using the antibiotic even if your condition improves. Check the irritated area every day for signs of infection. Check for: Redness, swelling, or pain. Fluid or blood. Warmth. Pus or a bad smell. Do not shave irritated skin. Take over-the-counter and prescription medicines only as told by your health care provider. Keep all follow-up visits as told by your health care provider. This is important. Get help right away if: You have more redness, swelling, or pain in the affected area. Red streaks are spreading from the affected area. You have a fever. Summary Folliculitis is inflammation of the hair follicles. Folliculitis most commonly occurs on the scalp, thighs, legs, back, and buttocks. This condition may be treated by taking an antibiotic medicine or applying an antibiotic medicine to the skin, and applying or bathing with an antiseptic solution. If you were prescribed an antibiotic medicine, take it or apply it as told by your health care provider. Do not stop using the antibiotic even if your condition improves. Get help right away if you have new or   worsening symptoms. Keep all follow-up visits as told by your health care provider. This is important. This information is not intended to replace advice given to you by your health care provider. Make sure you discuss any questions you have with your health  care provider. Document Revised: 10/11/2017 Document Reviewed: 10/19/2017 Elsevier Patient Education  2022 [employer]  

## 2020-11-12 LAB — CMP14+EGFR
ALT: 11 IU/L (ref 0–44)
AST: 14 IU/L (ref 0–40)
Albumin/Globulin Ratio: 1.8 (ref 1.2–2.2)
Albumin: 3.9 g/dL — ABNORMAL LOW (ref 4.0–5.0)
Alkaline Phosphatase: 91 IU/L (ref 44–121)
BUN/Creatinine Ratio: 7 — ABNORMAL LOW (ref 9–20)
BUN: 13 mg/dL (ref 6–24)
Bilirubin Total: 0.4 mg/dL (ref 0.0–1.2)
CO2: 17 mmol/L — ABNORMAL LOW (ref 20–29)
Calcium: 7.9 mg/dL — ABNORMAL LOW (ref 8.7–10.2)
Chloride: 101 mmol/L (ref 96–106)
Creatinine, Ser: 1.84 mg/dL — ABNORMAL HIGH (ref 0.76–1.27)
Globulin, Total: 2.2 g/dL (ref 1.5–4.5)
Glucose: 532 mg/dL (ref 65–99)
Potassium: 4.5 mmol/L (ref 3.5–5.2)
Sodium: 134 mmol/L (ref 134–144)
Total Protein: 6.1 g/dL (ref 6.0–8.5)
eGFR: 44 mL/min/{1.73_m2} — ABNORMAL LOW (ref >59)

## 2020-11-12 LAB — CBC WITH DIFFERENTIAL/PLATELET
Basophils Absolute: 0.1 10*3/uL (ref 0.0–0.2)
Basos: 1 %
EOS (ABSOLUTE): 0.1 10*3/uL (ref 0.0–0.4)
Eos: 1 %
Hematocrit: 39.9 % (ref 37.5–51.0)
Hemoglobin: 13.4 g/dL (ref 13.0–17.7)
Immature Grans (Abs): 0 10*3/uL (ref 0.0–0.1)
Immature Granulocytes: 1 %
Lymphocytes Absolute: 1.3 10*3/uL (ref 0.7–3.1)
Lymphs: 18 %
MCH: 27.8 pg (ref 26.6–33.0)
MCHC: 33.6 g/dL (ref 31.5–35.7)
MCV: 83 fL (ref 79–97)
Monocytes Absolute: 0.6 10*3/uL (ref 0.1–0.9)
Monocytes: 8 %
Neutrophils Absolute: 5.4 10*3/uL (ref 1.4–7.0)
Neutrophils: 71 %
Platelets: 219 10*3/uL (ref 150–450)
RBC: 4.82 x10E6/uL (ref 4.14–5.80)
RDW: 12.8 % (ref 11.6–15.4)
WBC: 7.5 10*3/uL (ref 3.4–10.8)

## 2020-11-12 LAB — LIPID PANEL
Chol/HDL Ratio: 4.2 ratio (ref 0.0–5.0)
Cholesterol, Total: 143 mg/dL (ref 100–199)
HDL: 34 mg/dL — ABNORMAL LOW (ref >39)
LDL Chol Calc (NIH): 87 mg/dL (ref 0–99)
Triglycerides: 122 mg/dL (ref 0–149)
VLDL Cholesterol Cal: 22 mg/dL (ref 5–40)

## 2020-11-14 ENCOUNTER — Other Ambulatory Visit: Payer: Self-pay | Admitting: Family Medicine

## 2020-11-14 DIAGNOSIS — E118 Type 2 diabetes mellitus with unspecified complications: Secondary | ICD-10-CM

## 2020-11-14 DIAGNOSIS — Z794 Long term (current) use of insulin: Secondary | ICD-10-CM

## 2020-11-14 DIAGNOSIS — N183 Chronic kidney disease, stage 3 unspecified: Secondary | ICD-10-CM

## 2020-11-14 MED ORDER — INSULIN GLARGINE 100 UNIT/ML ~~LOC~~ SOLN: 18.0000 [IU] | Freq: Every day | SUBCUTANEOUS | 4 refills | Status: DC

## 2020-11-14 MED ORDER — DAPAGLIFLOZIN PROPANEDIOL 5 MG PO TABS: 5.0000 mg | ORAL_TABLET | Freq: Every day | ORAL | 2 refills | Status: DC

## 2020-11-15 ENCOUNTER — Telehealth: Payer: Self-pay | Admitting: *Deleted

## 2020-11-15 NOTE — Telephone Encounter (Signed)
Prior Auth for [employer] and Reader-APPROVED  KEY- [id number]  KEY-[id number] Pt's wife is aware

## 2020-11-20 NOTE — Progress Notes (Deleted)
Cardiology Office Note   Date:  11/20/2020   ID:  [name], DOB 1970-06-11, MRN [id number]  PCP:  [name], FNP  Cardiologist:   [name], MD   No chief complaint on file.     History of Present Illness: [name] is a 50 y.o. male who presents for evaluation of CAD s/p BMS to mLAD 2010 with subsequent ISR managed with PCI/DES to LAD in 2018, chronic combined CHF and ischemic cardiomyopathy. He underwent a LHC which occurred 04/24/2019 and showed severe 2 vessel CAD with 85% mRCA stenosis managed with PCI/DES with 2nd stent placed upstream for catheter related dissection; he had 80% dLAD disease which was not amenable to stenting, and mild-moderate ISR on previous p-mLAD stent. He was recommended for aspirin and plavix as well as aggressive risk factor modifications for remaining disease. Echo 04/29/19 showed EF 35-40%, G1DD, and no significant valvular abnormalities.  He had hyperkalemia and had his Entresto stopped.  He had a follow up echo and had some improvement in his EF to 45%.  The apex is akinetic. He wore a monitor but unfortunately I do not have these results.  He was to have a perfusion study earlier this year.  He was a no show for this.  ***  ***  Thankfully since I last saw him he feels better. He is not having any of the dizziness that he was having. He is doing yard work. He thinks his breathing is okay. He has had no further seizures. He was in the hospital previously as previously mentioned and is still following with neurology. He denies any chest pressure, neck or arm discomfort. He has no new shortness of breath, PND or orthopnea. He has had no weight gain or edema.   Past Medical History:  Diagnosis Date   Alcohol abuse    CAD (coronary artery disease), native coronary artery    a. 01/2009 s/p Anterior MI and thrombectomy with BMS to mid LAD;  b. 06/2016 Cath/PCI: LM nl, LAD 84md, LCX nl, OM1/2/3 nl, RDA 679mInitial attempt @ PCI failed  followed by successful CTO LAD PCI and DES (2.5x38 Synergy)-->rec for indefinte DAPT.   CKD (chronic kidney disease), stage III ([facility]   Essential hypertension    History of nonadherence to medical treatment    Hyperlipidemia    Ischemic cardiomyopathy    a. EF 40% by nuc in 2012;  b. 06/2016 Echo: EF 40-45%, apicalanteroseptal and apical AK, Gr2 DD, mod Ca2+ Ao annulus, mild MR.   Slurred speech - Chronic    Type 2 diabetes mellitus ([facility]    Past Surgical History:  Procedure Laterality Date   CORONARY CTO INTERVENTION N/A 07/11/2016   Procedure: Coronary CTO Intervention;  Surgeon: Peter M JoMartiniqueMD;  [facility];  Service: Cardiovascular;  Laterality: N/A;   CORONARY STENT INTERVENTION N/A 07/09/2016   Procedure: Coronary Stent Intervention;  Surgeon: JoLorretta HarpMD;  [facility];  Service: Cardiovascular;  Laterality: N/A;   CORONARY STENT INTERVENTION N/A 04/24/2019   Procedure: CORONARY STENT INTERVENTION;  Surgeon: HaLeonie ManMD;  [facility];  Service: Cardiovascular;  Laterality: N/A;   Knee arthroscopic surgery Right    LEFT HEART CATH AND CORONARY ANGIOGRAPHY N/A 07/05/2016   Procedure: Left Heart Cath and Coronary Angiography;  Surgeon: HeBelva CromeMD;  [facility];  Service: Cardiovascular;  Laterality: N/A;   LEFT HEART  CATH AND CORONARY ANGIOGRAPHY N/A 04/24/2019   Procedure: LEFT HEART CATH AND CORONARY ANGIOGRAPHY;  Surgeon: Leonie Man, MD;  Location: [facility];  Service: Cardiovascular;  Laterality: N/A;     Current Outpatient Medications  Medication Sig Dispense Refill   Accu-Chek Softclix Lancets lancets Test BS QID Dx E11.9 400 each 3   aspirin 81 MG chewable tablet Chew 1 tablet (81 mg total) by mouth daily with breakfast. 120 tablet 2   atorvastatin (LIPITOR) 80 MG tablet Take 1 tablet (80 mg total) by mouth daily. (Patient taking differently: Take 80 mg by mouth at  bedtime.) 30 tablet 11   blood glucose meter kit and supplies Dispense based on patient and insurance preference. Use up to four times daily as directed. (FOR ICD-10 E10.9, E11.9). 1 each 0   carvedilol (COREG) 3.125 MG tablet Take 1 tablet (3.125 mg total) by mouth 2 (two) times daily with a meal. 180 tablet 3   cephALEXin (KEFLEX) 500 MG capsule Take 1 capsule (500 mg total) by mouth 2 (two) times daily. 14 capsule 0   cetirizine (ZYRTEC ALLERGY) 10 MG tablet Take 1 tablet (10 mg total) by mouth daily. 60 tablet 1   citalopram (CELEXA) 10 MG tablet Take 1 tablet (10 mg total) by mouth daily. 30 tablet 1   clopidogrel (PLAVIX) 75 MG tablet Take 1 tablet (75 mg total) by mouth daily. 30 tablet 6   Continuous Blood Gluc Receiver (FREESTYLE LIBRE 2 READER) DEVI 1 each by Does not apply route daily. 1 each 0   Continuous Blood Gluc Sensor (FREESTYLE LIBRE 2 SENSOR) MISC 1 each by Does not apply route every 14 (fourteen) days. 2 each 2   dapagliflozin propanediol (FARXIGA) 5 MG TABS tablet Take 1 tablet (5 mg total) by mouth daily before breakfast. 90 tablet 2   fluticasone (FLONASE) 50 MCG/ACT nasal spray Place 2 sprays into both nostrils daily. 16 g 6   gabapentin (NEURONTIN) 300 MG capsule Take 1 capsule (300 mg total) by mouth 3 (three) times daily. 90 capsule 3   glucose blood (ACCU-CHEK GUIDE) test strip Test BS QID Dx E11.9 400 each 3   insulin aspart (NOVOLOG) 100 UNIT/ML FlexPen Inject 0-10 Units into the skin See admin instructions. insulin aspart (novoLOG) injection 0-10 Units 0-10 Units Subcutaneous, 3 times daily with meals CBG < 70: Implement Hypoglycemia Standing Orders and refer to Hypoglycemia Standing Orders sidebar report  CBG 70 - 120: 0 unit CBG 121 - 150: 0 unit  CBG 151 - 200: 1 unit CBG 201 - 250: 2 units CBG 251 - 300: 4 units CBG 301 - 350: 6 units  CBG 351 - 400: 8 units  CBG > 400: 10 units 15 mL 11   insulin glargine (LANTUS) 100 UNIT/ML injection Inject 0.18 mLs (18 Units  total) into the skin at bedtime. 10 mL 4   Insulin Pen Needle (PEN NEEDLES) 32G X 5 MM MISC Use with Novolog Flexpen as directed 100 each 3   Insulin Syringe-Needle U-100 (TRUEPLUS INSULIN SYRINGE) 31G X 5/16" 1 ML MISC Use to inject insulin once daily Dx E11.8 100 each 3   isosorbide mononitrate (IMDUR) 60 MG 24 hr tablet Take 1 tablet (60 mg total) by mouth daily. 90 tablet 3   levETIRAcetam (KEPPRA) 500 MG tablet Take 500 mg by mouth 2 (two) times daily.     lisinopril (ZESTRIL) 5 MG tablet Take 1 tablet (5 mg total) by mouth daily. 90 tablet 3  nitroGLYCERIN (NITROSTAT) 0.4 MG SL tablet Place 1 tablet (0.4 mg total) under the tongue every 5 (five) minutes as needed for chest pain. 25 tablet 2   Oxcarbazepine (TRILEPTAL) 300 MG tablet Take 1/2 tablet twice a day for 1 week, then increase to 1 tablet twice a day for 1 week, then increase to 2 tablets twice a day and continue 120 tablet 5   pantoprazole (PROTONIX) 40 MG tablet Take 1 tablet (40 mg total) by mouth daily. 30 tablet 5   sodium bicarbonate 650 MG tablet Take 650 mg by mouth 2 (two) times daily.     No current facility-administered medications for this visit.    Allergies:   Penicillins    ROS:  Please see the history of present illness.   Otherwise, review of systems are positive for ***.   All other systems are reviewed and negative.    PHYSICAL EXAM: VS:  There were no vitals taken for this visit. , BMI There is no height or weight on file to calculate BMI. GENERAL:  Well appearing NECK:  No jugular venous distention, waveform within normal limits, carotid upstroke brisk and symmetric, no bruits, no thyromegaly LUNGS:  Clear to auscultation bilaterally CHEST:  Unremarkable HEART:  PMI not displaced or sustained,S1 and S2 within normal limits, no S3, no S4, no clicks, no rubs, *** murmurs ABD:  Flat, positive bowel sounds normal in frequency in pitch, no bruits, no rebound, no guarding, no midline pulsatile mass, no  hepatomegaly, no splenomegaly EXT:  2 plus pulses throughout, no edema, no cyanosis no clubbing    ***GENERAL:  Well appearing NECK:  No jugular venous distention, waveform within normal limits, carotid upstroke brisk and symmetric, no bruits, no thyromegaly LUNGS:  Clear to auscultation bilaterally CHEST:  Unremarkable HEART:  PMI not displaced or sustained,S1 and S2 within normal limits, no S3, no S4, no clicks, no rubs, no murmurs ABD:  Flat, positive bowel sounds normal in frequency in pitch, no bruits, no rebound, no guarding, no midline pulsatile mass, no hepatomegaly, no splenomegaly EXT:  2 plus pulses throughout, no edema, no cyanosis no clubbing   EKG:  EKG is *** ordered today. ***   Recent Labs: 05/26/2020: TSH 3.290 11/11/2020: ALT 11; BUN 13; Creatinine, Ser 1.84; Hemoglobin 13.4; Platelets 219; Potassium 4.5; Sodium 134    Lipid Panel    Component Value Date/Time   CHOL 143 11/11/2020 1330   TRIG 122 11/11/2020 1330   HDL 34 (L) 11/11/2020 1330   CHOLHDL 4.2 11/11/2020 1330   CHOLHDL 2.1 07/06/2016 0534   VLDL 19 07/06/2016 0534   LDLCALC 87 11/11/2020 1330      Wt Readings from Last 3 Encounters:  11/11/20 151 lb 2 oz (68.5 kg)  08/29/20 155 lb 8 oz (70.5 kg)  06/09/20 160 lb 4 oz (72.7 kg)      Other studies Reviewed: Additional studies/ records that were reviewed today include: *** Review of the above records demonstrates:  Please see elsewhere in the note.     ASSESSMENT AND PLAN:  CAD:     ***  The patient has no new sypmtoms.  No further cardiovascular testing is indicated.  We will continue with aggressive risk reduction and meds as listed.  Hypertension: Blood pressure is  *** at target but I am going to treat this also in the context of managing his cardiomyopathy.  Hyperlipidemia:    Total cholesterol is ***94. This was in May. No change in therapy.  DM II:     Last A1c that I can see was *** 8.0 in April. I will defer toMorgan, Arman Bogus, FNP.  I will suggest consideration of a SGLT2i or GLP1ra but will defer to the PCP.   CKD stage III:   Creatinine was most recently *** 1.80 which was lower than previous. His potassium has normalized.  Ischemic cardiomyopathy:  EF was 45% on echo last year.  *** His ejection fraction was improved to 45%. I would like again to try the [facility] since he is tolerating Cozaar. I would be 49/52. However, we will keep a very close eye on his potassium. I have ordered potassium in 1 week after starting the [facility] and then 2 weeks after that.  Dizziness:   ***He is no longer having dizziness. I will follow up with the results of the monitor.   Current medicines are reviewed at length with the patient today.  The patient does not have concerns regarding medicines.  The following changes have been made:  ***  Labs/ tests ordered today include:   ***  No orders of the defined types were placed in this encounter.    Disposition:   FU with me in *** months.     Signed, [name], MD  11/20/2020 11:30 AM    [facility]

## 2020-11-21 ENCOUNTER — Ambulatory Visit: Payer: Medicaid Other | Admitting: Cardiology

## 2020-11-21 DIAGNOSIS — E118 Type 2 diabetes mellitus with unspecified complications: Secondary | ICD-10-CM

## 2020-11-21 DIAGNOSIS — R42 Dizziness and giddiness: Secondary | ICD-10-CM

## 2020-11-21 DIAGNOSIS — N183 Chronic kidney disease, stage 3 unspecified: Secondary | ICD-10-CM

## 2020-11-21 DIAGNOSIS — I2510 Atherosclerotic heart disease of native coronary artery without angina pectoris: Secondary | ICD-10-CM

## 2020-11-21 DIAGNOSIS — I10 Essential (primary) hypertension: Secondary | ICD-10-CM

## 2020-11-21 DIAGNOSIS — I255 Ischemic cardiomyopathy: Secondary | ICD-10-CM

## 2020-11-21 DIAGNOSIS — E785 Hyperlipidemia, unspecified: Secondary | ICD-10-CM

## 2020-11-29 DIAGNOSIS — E872 Acidosis: Secondary | ICD-10-CM | POA: Diagnosis not present

## 2020-11-29 DIAGNOSIS — I129 Hypertensive chronic kidney disease with stage 1 through stage 4 chronic kidney disease, or unspecified chronic kidney disease: Secondary | ICD-10-CM | POA: Diagnosis not present

## 2020-11-29 DIAGNOSIS — N183 Chronic kidney disease, stage 3 unspecified: Secondary | ICD-10-CM | POA: Diagnosis not present

## 2020-11-29 DIAGNOSIS — E875 Hyperkalemia: Secondary | ICD-10-CM | POA: Diagnosis not present

## 2020-11-29 DIAGNOSIS — E1129 Type 2 diabetes mellitus with other diabetic kidney complication: Secondary | ICD-10-CM | POA: Diagnosis not present

## 2020-11-29 DIAGNOSIS — I255 Ischemic cardiomyopathy: Secondary | ICD-10-CM | POA: Diagnosis not present

## 2020-12-05 ENCOUNTER — Ambulatory Visit: Payer: Medicaid Other | Admitting: Neurology

## 2021-01-17 ENCOUNTER — Ambulatory Visit: Payer: Medicaid Other | Admitting: Family Medicine

## 2021-01-18 ENCOUNTER — Encounter: Payer: Self-pay | Admitting: Family Medicine

## 2021-01-20 ENCOUNTER — Other Ambulatory Visit: Payer: Self-pay

## 2021-01-20 ENCOUNTER — Ambulatory Visit: Payer: Medicaid Other | Admitting: Family Medicine

## 2021-01-20 ENCOUNTER — Encounter: Payer: Self-pay | Admitting: Family Medicine

## 2021-01-20 VITALS — BP 128/78 | HR 96 | Temp 98.5°F | Ht 67.0 in | Wt 145.0 lb

## 2021-01-20 DIAGNOSIS — L739 Follicular disorder, unspecified: Secondary | ICD-10-CM | POA: Diagnosis not present

## 2021-01-20 DIAGNOSIS — E1159 Type 2 diabetes mellitus with other circulatory complications: Secondary | ICD-10-CM | POA: Diagnosis not present

## 2021-01-20 DIAGNOSIS — F419 Anxiety disorder, unspecified: Secondary | ICD-10-CM | POA: Diagnosis not present

## 2021-01-20 DIAGNOSIS — F331 Major depressive disorder, recurrent, moderate: Secondary | ICD-10-CM

## 2021-01-20 DIAGNOSIS — I152 Hypertension secondary to endocrine disorders: Secondary | ICD-10-CM

## 2021-01-20 MED ORDER — CEPHALEXIN 500 MG PO CAPS: 500.0000 mg | ORAL_CAPSULE | Freq: Two times a day (BID) | ORAL | 0 refills | Status: AC

## 2021-01-20 MED ORDER — OLMESARTAN MEDOXOMIL 20 MG PO TABS: 20.0000 mg | ORAL_TABLET | Freq: Every day | ORAL | 3 refills | Status: DC

## 2021-01-20 MED ORDER — CITALOPRAM HYDROBROMIDE 20 MG PO TABS: 20.0000 mg | ORAL_TABLET | Freq: Every day | ORAL | 3 refills | Status: DC

## 2021-01-20 NOTE — Patient Instructions (Signed)
Folliculitis Folliculitis is inflammation of the hair follicles. Folliculitis most commonly occurs on the scalp, thighs, legs, back, and buttocks. However, it can occur anywhere on the body. What are the causes? This condition may be caused by: A bacterial infection (common). A fungal infection. A viral infection. Contact with certain chemicals, especially oils and tars. Shaving or waxing. Greasy ointments or creams applied to the skin. Long-lasting folliculitis and folliculitis that keeps coming back may be caused by bacteria. This bacteria can live anywhere on your skin and is often found in the nostrils. What increases the risk? You are more likely to develop this condition if you have: A weakened immune system. Diabetes. Obesity. What are the signs or symptoms? Symptoms of this condition include: Redness. Soreness. Swelling. Itching. Small white or yellow, pus-filled, itchy spots (pustules) that appear over a reddened area. If there is an infection that goes deep into the follicle, these may develop into a boil (furuncle). A group of closely packed boils (carbuncle). These tend to form in hairy, sweaty areas of the body. How is this diagnosed? This condition is diagnosed with a skin exam. To find what is causing the condition, your health care provider may take a sample of one of the pustules or boils for testing in a lab. How is this treated? This condition may be treated by: Applying warm compresses to the affected areas. Taking an antibiotic medicine or applying an antibiotic medicine to the skin. Applying or bathing with an antiseptic solution. Taking an over-the-counter medicine to help with itching. Having a procedure to drain any pustules or boils. This may be done if a pustule or boil contains a lot of pus or fluid. Having laser hair removal. This may be done to treat long-lasting folliculitis. Follow these instructions at home: Managing pain and swelling  If  directed, apply heat to the affected area as often as told by your health care provider. Use the heat source that your health care provider recommends, such as a moist heat pack or a heating pad. Place a towel between your skin and the heat source. Leave the heat on for 20-30 minutes. Remove the heat if your skin turns bright red. This is especially important if you are unable to feel pain, heat, or cold. You may have a greater risk of getting burned. General instructions If you were prescribed an antibiotic medicine, take it or apply it as told by your health care provider. Do not stop using the antibiotic even if your condition improves. Check the irritated area every day for signs of infection. Check for: Redness, swelling, or pain. Fluid or blood. Warmth. Pus or a bad smell. Do not shave irritated skin. Take over-the-counter and prescription medicines only as told by your health care provider. Keep all follow-up visits as told by your health care provider. This is important. Get help right away if: You have more redness, swelling, or pain in the affected area. Red streaks are spreading from the affected area. You have a fever. Summary Folliculitis is inflammation of the hair follicles. Folliculitis most commonly occurs on the scalp, thighs, legs, back, and buttocks. This condition may be treated by taking an antibiotic medicine or applying an antibiotic medicine to the skin, and applying or bathing with an antiseptic solution. If you were prescribed an antibiotic medicine, take it or apply it as told by your health care provider. Do not stop using the antibiotic even if your condition improves. Get help right away if you have new or   worsening symptoms. Keep all follow-up visits as told by your health care provider. This is important. This information is not intended to replace advice given to you by your health care provider. Make sure you discuss any questions you have with your health  care provider. Document Revised: 10/11/2017 Document Reviewed: 10/19/2017 Elsevier Patient Education  2022 [employer]  

## 2021-01-20 NOTE — Progress Notes (Signed)
Established Patient Office Visit  Subjective:  Patient ID: [name], male    DOB: 10/26/1970  Age: 50 y.o. MRN: [id number]  CC:  Chief Complaint  Patient presents with   Rash    HPI [name] presents for a rash x 1 week. The rash in on his trunk. It is itchy. It is red, bumpy, and has white heads. He has a history of recurrent folliculitis. Denies fever, warmth, or drainage.   He has had increased irritability and anxiety lately. He wanders if his celexa needs to be increased.   He has also had a dry cough since starting on lisinopril.   Depression screen [facility] 2/9 01/20/2021 11/11/2020 08/29/2020  Decreased Interest 2 0 0  Down, Depressed, Hopeless 2 0 0  PHQ - 2 Score 4 0 0  Altered sleeping 2 0 0  Tired, decreased energy 2 1 0  Change in appetite 2 0 0  Feeling bad or failure about yourself  0 0 0  Trouble concentrating 0 0 0  Moving slowly or fidgety/restless 0 0 0  Suicidal thoughts 0 0 0  PHQ-9 Score 10 1 0  Difficult doing work/chores Not difficult at all Somewhat difficult Not difficult at all   GAD 7 : Generalized Anxiety Score 01/20/2021 11/11/2020 08/29/2020  Nervous, Anxious, on Edge 2 0 1  Control/stop worrying 0 0 0  Worry too much - different things 0 0 0  Trouble relaxing 0 0 0  Restless 0 0 0  Easily annoyed or irritable 3 1 1   Afraid - awful might happen 0 0 0  Total GAD 7 Score 5 1 2   Anxiety Difficulty Not difficult at all Somewhat difficult Somewhat difficult      Past Medical History:  Diagnosis Date   Alcohol abuse    CAD (coronary artery disease), native coronary artery    a. 01/2009 s/p Anterior MI and thrombectomy with BMS to mid LAD;  b. 06/2016 Cath/PCI: LM nl, LAD 65md, LCX nl, OM1/2/3 nl, RDA 667mInitial attempt @ PCI failed followed by successful CTO LAD PCI and DES (2.5x38 Synergy)-->rec for indefinte DAPT.   CKD (chronic kidney disease), stage III ([facility]   Essential hypertension    History of nonadherence to medical treatment     Hyperlipidemia    Ischemic cardiomyopathy    a. EF 40% by nuc in 2012;  b. 06/2016 Echo: EF 40-45%, apicalanteroseptal and apical AK, Gr2 DD, mod Ca2+ Ao annulus, mild MR.   Slurred speech - Chronic    Type 2 diabetes mellitus ([facility]    Past Surgical History:  Procedure Laterality Date   CORONARY CTO INTERVENTION N/A 07/11/2016   Procedure: Coronary CTO Intervention;  Surgeon: Peter M JoMartiniqueMD;  [facility];  Service: Cardiovascular;  Laterality: N/A;   CORONARY STENT INTERVENTION N/A 07/09/2016   Procedure: Coronary Stent Intervention;  Surgeon: JoLorretta HarpMD;  [facility];  Service: Cardiovascular;  Laterality: N/A;   CORONARY STENT INTERVENTION N/A 04/24/2019   Procedure: CORONARY STENT INTERVENTION;  Surgeon: HaLeonie ManMD;  [facility];  Service: Cardiovascular;  Laterality: N/A;   Knee arthroscopic surgery Right    LEFT HEART CATH AND CORONARY ANGIOGRAPHY N/A 07/05/2016   Procedure: Left Heart Cath and Coronary Angiography;  Surgeon: HeBelva CromeMD;  [facility];  Service: Cardiovascular;  Laterality: N/A;   LEFT HEART CATH AND CORONARY ANGIOGRAPHY N/A 04/24/2019  Procedure: LEFT HEART CATH AND CORONARY ANGIOGRAPHY;  Surgeon: Leonie Man, MD;  Location: [place] [facility];  Service: Cardiovascular;  Laterality: N/A;    Family History  Problem Relation Age of Onset   Diabetes Mellitus II Other    Heart disease Mother     Social History   Socioeconomic History   Marital status: Married    Spouse name: Not on file   Number of children: Not on file   Years of education: Not on file   Highest education level: Not on file  Occupational History   Not on file  Tobacco Use   Smoking status: Never   Smokeless tobacco: Never  Vaping Use   Vaping Use: Never used  Substance and Sexual Activity   Alcohol use: No    Alcohol/week: 1.0 standard drink    Types: 1 Cans of beer per week   Drug use:  No   Sexual activity: Yes  Other Topics Concern   Not on file  Social History Narrative   Married with one child   Lives in [place], [place]   Right Handed   One story home   Drinks some caffeine (Green Tea)   Social Determinants of Health   Financial Resource Strain: Not on file  Food Insecurity: Not on file  Transportation Needs: Not on file  Physical Activity: Not on file  Stress: Not on file  Social Connections: Not on file  Intimate Partner Violence: Not on file    Outpatient Medications Prior to Visit  Medication Sig Dispense Refill   Accu-Chek Softclix Lancets lancets Test BS QID Dx E11.9 400 each 3   acetaZOLAMIDE ER (DIAMOX) 500 MG capsule Take 500 mg by mouth 2 (two) times daily.     aspirin 81 MG chewable tablet Chew 1 tablet (81 mg total) by mouth daily with breakfast. 120 tablet 2   atorvastatin (LIPITOR) 80 MG tablet Take 1 tablet (80 mg total) by mouth daily. (Patient taking differently: Take 80 mg by mouth at bedtime.) 30 tablet 11   blood glucose meter kit and supplies Dispense based on patient and insurance preference. Use up to four times daily as directed. (FOR ICD-10 E10.9, E11.9). 1 each 0   carvedilol (COREG) 3.125 MG tablet Take 1 tablet (3.125 mg total) by mouth 2 (two) times daily with a meal. 180 tablet 3   cetirizine (ZYRTEC ALLERGY) 10 MG tablet Take 1 tablet (10 mg total) by mouth daily. 60 tablet 1   citalopram (CELEXA) 10 MG tablet Take 1 tablet (10 mg total) by mouth daily. 30 tablet 1   clopidogrel (PLAVIX) 75 MG tablet Take 1 tablet (75 mg total) by mouth daily. 30 tablet 6   Continuous Blood Gluc Receiver (FREESTYLE LIBRE 2 READER) DEVI 1 each by Does not apply route daily. 1 each 0   Continuous Blood Gluc Sensor (FREESTYLE LIBRE 2 SENSOR) MISC 1 each by Does not apply route every 14 (fourteen) days. 2 each 2   dapagliflozin propanediol (FARXIGA) 5 MG TABS tablet Take 1 tablet (5 mg total) by mouth daily before breakfast. 90 tablet 2   fluticasone  (FLONASE) 50 MCG/ACT nasal spray Place 2 sprays into both nostrils daily. 16 g 6   gabapentin (NEURONTIN) 300 MG capsule Take 1 capsule (300 mg total) by mouth 3 (three) times daily. 90 capsule 3   glucose blood (ACCU-CHEK GUIDE) test strip Test BS QID Dx E11.9 400 each 3   insulin aspart (NOVOLOG) 100 UNIT/ML FlexPen Inject 0-10  Units into the skin See admin instructions. insulin aspart (novoLOG) injection 0-10 Units 0-10 Units Subcutaneous, 3 times daily with meals CBG < 70: Implement Hypoglycemia Standing Orders and refer to Hypoglycemia Standing Orders sidebar report  CBG 70 - 120: 0 unit CBG 121 - 150: 0 unit  CBG 151 - 200: 1 unit CBG 201 - 250: 2 units CBG 251 - 300: 4 units CBG 301 - 350: 6 units  CBG 351 - 400: 8 units  CBG > 400: 10 units 15 mL 11   insulin glargine (LANTUS) 100 UNIT/ML injection Inject 0.18 mLs (18 Units total) into the skin at bedtime. 10 mL 4   Insulin Pen Needle (PEN NEEDLES) 32G X 5 MM MISC Use with Novolog Flexpen as directed 100 each 3   Insulin Syringe-Needle U-100 (TRUEPLUS INSULIN SYRINGE) 31G X 5/16" 1 ML MISC Use to inject insulin once daily Dx E11.8 100 each 3   isosorbide mononitrate (IMDUR) 60 MG 24 hr tablet Take 1 tablet (60 mg total) by mouth daily. 90 tablet 3   levETIRAcetam (KEPPRA) 500 MG tablet Take 500 mg by mouth 2 (two) times daily.     lisinopril (ZESTRIL) 5 MG tablet Take 1 tablet (5 mg total) by mouth daily. 90 tablet 3   nitroGLYCERIN (NITROSTAT) 0.4 MG SL tablet Place 1 tablet (0.4 mg total) under the tongue every 5 (five) minutes as needed for chest pain. 25 tablet 2   Oxcarbazepine (TRILEPTAL) 300 MG tablet Take 1/2 tablet twice a day for 1 week, then increase to 1 tablet twice a day for 1 week, then increase to 2 tablets twice a day and continue 120 tablet 5   pantoprazole (PROTONIX) 40 MG tablet Take 1 tablet (40 mg total) by mouth daily. 30 tablet 5   sodium bicarbonate 650 MG tablet Take 650 mg by mouth 2 (two) times daily.     cephALEXin  (KEFLEX) 500 MG capsule Take 1 capsule (500 mg total) by mouth 2 (two) times daily. 14 capsule 0   No facility-administered medications prior to visit.    Allergies  Allergen Reactions   Penicillins Rash         ROS Review of Systems As per HPI.    Objective:    Physical Exam Vitals and nursing note reviewed.  Constitutional:      General: He is not in acute distress.    Appearance: He is not ill-appearing, toxic-appearing or diaphoretic.  Cardiovascular:     Rate and Rhythm: Normal rate and regular rhythm.     Heart sounds: Normal heart sounds. No murmur heard. Pulmonary:     Effort: No respiratory distress.     Breath sounds: Normal breath sounds.  Skin:    General: Skin is warm and dry.     Findings: Rash (rash consistent with folliculitis to anterior and posterior trunk) present.  Neurological:     Mental Status: He is alert and oriented to person, place, and time.  Psychiatric:        Mood and Affect: Mood normal.        Behavior: Behavior normal.    BP 128/78   Pulse 96   Temp 98.5 F (36.9 C) (Temporal)   Ht 5' 7"  (1.702 m)   Wt 145 lb (65.8 kg)   BMI 22.71 kg/m  Wt Readings from Last 3 Encounters:  01/20/21 145 lb (65.8 kg)  11/11/20 151 lb 2 oz (68.5 kg)  08/29/20 155 lb 8 oz (70.5 kg)  Health Maintenance Due  Topic Date Due   OPHTHALMOLOGY EXAM  03/07/2019   INFLUENZA VACCINE  Never done    There are no preventive care reminders to display for this patient.  Lab Results  Component Value Date   TSH 3.290 05/26/2020   Lab Results  Component Value Date   WBC 7.5 11/11/2020   HGB 13.4 11/11/2020   HCT 39.9 11/11/2020   MCV 83 11/11/2020   PLT 219 11/11/2020   Lab Results  Component Value Date   NA 134 11/11/2020   K 4.5 11/11/2020   CO2 17 (L) 11/11/2020   GLUCOSE 532 (HH) 11/11/2020   BUN 13 11/11/2020   CREATININE 1.84 (H) 11/11/2020   BILITOT 0.4 11/11/2020   ALKPHOS 91 11/11/2020   AST 14 11/11/2020   ALT 11 11/11/2020    PROT 6.1 11/11/2020   ALBUMIN 3.9 (L) 11/11/2020   CALCIUM 7.9 (L) 11/11/2020   ANIONGAP 7 09/12/2019   EGFR 44 (L) 11/11/2020   Lab Results  Component Value Date   CHOL 143 11/11/2020   Lab Results  Component Value Date   HDL 34 (L) 11/11/2020   Lab Results  Component Value Date   LDLCALC 87 11/11/2020   Lab Results  Component Value Date   TRIG 122 11/11/2020   Lab Results  Component Value Date   CHOLHDL 4.2 11/11/2020   Lab Results  Component Value Date   HGBA1C 8.3 (H) 11/11/2020      Assessment & Plan:   [name] was seen today for rash.  Diagnoses and all orders for this visit:  Folliculitis Keflex as below. Handout given.  -     cephALEXin (KEFLEX) 500 MG capsule; Take 1 capsule (500 mg total) by mouth 2 (two) times daily for 10 days.  Moderate episode of recurrent major depressive disorder (HCC) Anxiety PHQ score of 10. Denies SI. GAD score of 5. Increase celexa as below.  -     citalopram (CELEXA) 20 MG tablet; Take 1 tablet (20 mg total) by mouth daily.  Hypertension associated with diabetes ([place]) Reports cough due to lisinopril. Switch to benicar as below.  -     olmesartan (BENICAR) 20 MG tablet; Take 1 tablet (20 mg total) by mouth daily.  Follow-up: Return in about 19 days (around 02/08/2021) for chronic follow up.  The patient indicates understanding of these issues and agrees with the plan.    [name], FNP

## 2021-01-25 ENCOUNTER — Telehealth: Payer: Self-pay | Admitting: Family Medicine

## 2021-01-25 NOTE — Telephone Encounter (Signed)
FAXED MED LIST TO [facility]

## 2021-01-26 ENCOUNTER — Other Ambulatory Visit: Payer: Self-pay | Admitting: *Deleted

## 2021-01-27 MED ORDER — GABAPENTIN 300 MG PO CAPS: 300.0000 mg | ORAL_CAPSULE | Freq: Three times a day (TID) | ORAL | 0 refills | Status: DC

## 2021-01-27 MED ORDER — ISOSORBIDE MONONITRATE ER 60 MG PO TB24: 60.0000 mg | ORAL_TABLET | Freq: Every day | ORAL | 0 refills | Status: DC

## 2021-01-27 MED ORDER — PANTOPRAZOLE SODIUM 40 MG PO TBEC: 40.0000 mg | DELAYED_RELEASE_TABLET | Freq: Every day | ORAL | 0 refills | Status: DC

## 2021-01-27 MED ORDER — ATORVASTATIN CALCIUM 80 MG PO TABS
80.0000 mg | ORAL_TABLET | Freq: Every day | ORAL | 0 refills | Status: DC
Start: 2021-01-27 — End: 2021-02-08

## 2021-01-27 MED ORDER — CARVEDILOL 3.125 MG PO TABS: 3.1250 mg | ORAL_TABLET | Freq: Two times a day (BID) | ORAL | 0 refills | Status: DC

## 2021-01-27 NOTE — Telephone Encounter (Signed)
30 day supply sent in. Patient needs to schedule chronic follow up appointment.

## 2021-02-01 ENCOUNTER — Telehealth (INDEPENDENT_AMBULATORY_CARE_PROVIDER_SITE_OTHER): Payer: Medicaid Other | Admitting: Family Medicine

## 2021-02-01 ENCOUNTER — Encounter: Payer: Self-pay | Admitting: Family Medicine

## 2021-02-01 DIAGNOSIS — L739 Follicular disorder, unspecified: Secondary | ICD-10-CM | POA: Diagnosis not present

## 2021-02-01 MED ORDER — DOXYCYCLINE HYCLATE 100 MG PO TABS
100.0000 mg | ORAL_TABLET | Freq: Two times a day (BID) | ORAL | 0 refills | Status: AC
Start: 2021-02-01 — End: 2021-02-08

## 2021-02-01 NOTE — Progress Notes (Signed)
   Virtual Visit  Note Due to COVID-19 pandemic this visit was conducted virtually. This visit type was conducted due to national recommendations for restrictions regarding the COVID-19 Pandemic (e.g. social distancing, sheltering in place) in an effort to limit this patient's exposure and mitigate transmission in our community. All issues noted in this document were discussed and addressed.  A physical exam was not performed with this format.  I connected with [name] on 02/01/21 at 1201 by telephone and verified that I am speaking with the correct person using two identifiers. [name] is currently located at home and his wife is currently with him during the visit. The provider, Gabriel Earing, FNP is located in their office at time of visit.  I discussed the limitations, risks, security and privacy concerns of performing an evaluation and management service by telephone and the availability of in person appointments. I also discussed with the patient that there may be a patient responsible charge related to this service. The patient expressed understanding and agreed to proceed.  CC: rash  History and Present Illness:  HPI Attempted to connect via video, however patient was unable to do so. Visit was conducted via telephone audio only today. Costantino was treated with keflex for folliculitis on 01/20/21. He completed the course of antibiotics. He reports that his rash has improved some but has not cleared up. He continues to have the rash on his trunk. He denies fever, chills, warmth, or drainage.     ROS As per HPI.   Observations/Objective: Alert and oriented x 3. Able to speak in full sentences without difficulty.   Assessment and Plan: [name] was seen today for rash.  Diagnoses and all orders for this visit:  Folliculitis Doxycyline as below. Return to office for new or worsening symptoms, or if symptoms persist.  -     doxycycline (VIBRA-TABS) 100 MG tablet; Take 1 tablet  (100 mg total) by mouth 2 (two) times daily for 7 days. 1 po bid    Follow Up Instructions: As needed. Needs to schedule chronic follow up appointment.     I discussed the assessment and treatment plan with the patient. The patient was provided an opportunity to ask questions and all were answered. The patient agreed with the plan and demonstrated an understanding of the instructions.   The patient was advised to call back or seek an in-person evaluation if the symptoms worsen or if the condition fails to improve as anticipated.  The above assessment and management plan was discussed with the patient. The patient verbalized understanding of and has agreed to the management plan. Patient is aware to call the clinic if symptoms persist or worsen. Patient is aware when to return to the clinic for a follow-up visit. Patient educated on when it is appropriate to go to the emergency department.   Time call ended:  1215  I provided 14 minutes of  non face-to-face time during this encounter.    Gabriel Earing, FNP

## 2021-02-03 DIAGNOSIS — H4051X4 Glaucoma secondary to other eye disorders, right eye, indeterminate stage: Secondary | ICD-10-CM | POA: Diagnosis not present

## 2021-02-03 DIAGNOSIS — Z01818 Encounter for other preprocedural examination: Secondary | ICD-10-CM | POA: Diagnosis not present

## 2021-02-06 ENCOUNTER — Telehealth: Payer: Self-pay

## 2021-02-06 NOTE — Telephone Encounter (Signed)
   [facility] Pre-operative Risk Assessment    Patient Name: [name]  DOB: [birth year] MRN: [id number]  HEARTCARE STAFF:  - IMPORTANT!!!!!! Under Visit Info/Reason for Call, type in Other and utilize the format Clearance MM/DD/YY or Clearance TBD. Do not use dashes or single digits. - Please review there is not already an duplicate clearance open for this procedure. - If request is for dental extraction, please clarify the # of teeth to be extracted. - If the patient is currently at the dentist's office, call Pre-Op Callback Staff (MA/nurse) to input urgent request.  - If the patient is not currently in the dentist office, please route to the Pre-Op pool.  Request for surgical clearance:  What type of surgery is being performed? Tube Shunt-Right   When is this surgery scheduled? TBD  What type of clearance is required (medical clearance vs. Pharmacy clearance to hold med vs. Both)? Both  Are there any medications that need to be held prior to surgery and how long? ASA & Plavix for 5 days prior to  Practice name and name of physician performing surgery? [facility], Dr. Hart Carwin B. Grissom  What is the office phone number? [phone] ext [phone]   7.   What is the office fax number? [phone]  8.   Anesthesia type (None, local, MAC, general) ? IV Sedation   Jacqulynn Cadet 02/06/2021, 11:47 AM  _________________________________________________________________   (provider comments below)

## 2021-02-06 NOTE — Telephone Encounter (Signed)
   Patient Name: [name]  DOB: 01/05/1971 MRN: [id number]  Primary Cardiologist: Rollene Rotunda, MD  Chart reviewed as part of pre-operative protocol coverage. The patient has an upcoming appointment scheduled in 2 days (02/08/21) at which time this clearance can be addressed in case there are any issues that would impact surgical clearance. I added preop FYI to appointment notes so that provider is aware to address at time of OV.   Per office protocol, the cardiology provider should forward their finalized clearance decision to requesting party below. I will route this message as FYI to requesting party and remove this message from the pre-op box as separate preop APP input not needed at this time.  Please call with questions.  Laurann Montana, PA-C 02/06/2021, 12:38 PM

## 2021-02-06 NOTE — Telephone Encounter (Signed)
Patient not on anticoagulation 

## 2021-02-06 NOTE — Telephone Encounter (Signed)
Clearance was routed to pharm team upon entry, no anticoag noted. MD to address ASA/Plavix at OV.

## 2021-02-07 NOTE — Progress Notes (Signed)
Cardiology Office Note   Date:  02/08/2021   ID:  [name], DOB 1971/01/27, MRN [id number]  PCP:  [name], FNP  Cardiologist:   [name], MD   Chief Complaint  Patient presents with   Coronary Artery Disease       History of Present Illness: [name] is a 50 y.o. male who presents with a hx of coronary disease.  He had an LAD BMS in 2010.  He had in-stent restenosis in 2018 treated with PCI.  Catheterization in January 2021 showed a patent LAD stent with an 80% distal LAD.  He had a 70% proximal RCA and an 85% mid RCA, the RCA was treated with PCI and DES.  The patient also has several other medical issues.  He has insulin-dependent diabetes.  He has treated dyslipidemia.  He has chronic renal insufficiency 3 with a GFR 44.  There is a history of seizures.   His last ejection fraction was 45% by echocardiogram May 2021.  In the past he has had problems with hyperkalemia.  He was on Entresto at one point and this was stopped and he was restarted on Losartan.    He needs an eye procedure and the question was whether he is able to come off of his Plavix.  When he was last seen earlier this year there was some question of chest discomfort and it was suggested that he might need a stress test but he does not know what happened to that.  Now he says he feels great.  He does a lot of physical activity and a lot of walking for exercise and he says he does not have chest pain.  He has not had any of the symptoms that he had at the time of his prior angioplasties.  He has no new shortness of breath, PND or orthopnea.  He has no palpitations, presyncope or syncope.  Unfortunately did not take his medicines this morning and is not clear what he is taking and when.   Past Medical History:  Diagnosis Date   Alcohol abuse    CAD (coronary artery disease), native coronary artery    a. 01/2009 s/p Anterior MI and thrombectomy with BMS to mid LAD;  b. 06/2016 Cath/PCI: LM nl,  LAD 43md, LCX nl, OM1/2/3 nl, RDA 63mInitial attempt @ PCI failed followed by successful CTO LAD PCI and DES (2.5x38 [employer]nergy)-->rec for indefinte DAPT.   CKD (chronic kidney disease), stage III ([facility]   Essential hypertension    History of nonadherence to medical treatment    Hyperlipidemia    Ischemic cardiomyopathy    a. EF 40% by nuc in 2012;  b. 06/2016 Echo: EF 40-45%, apicalanteroseptal and apical AK, Gr2 DD, mod Ca2+ Ao annulus, mild MR.   Slurred speech - Chronic    Type 2 diabetes mellitus ([facility]    Past Surgical History:  Procedure Laterality Date   CORONARY CTO INTERVENTION N/A 07/11/2016   Procedure: Coronary CTO Intervention;  Surgeon: Peter M JoMartiniqueMD;  [facility];  Service: Cardiovascular;  Laterality: N/A;   CORONARY STENT INTERVENTION N/A 07/09/2016   Procedure: Coronary Stent Intervention;  Surgeon: JoLorretta HarpMD;  [facility];  Service: Cardiovascular;  Laterality: N/A;   CORONARY STENT INTERVENTION N/A 04/24/2019   Procedure: CORONARY STENT INTERVENTION;  Surgeon: HaLeonie ManMD;  [facility];  Service: Cardiovascular;  Laterality: N/A;   Knee arthroscopic  surgery Right    LEFT HEART CATH AND CORONARY ANGIOGRAPHY N/A 07/05/2016   Procedure: Left Heart Cath and Coronary Angiography;  Surgeon: Belva Crome, MD;  Location: [facility];  Service: Cardiovascular;  Laterality: N/A;   LEFT HEART CATH AND CORONARY ANGIOGRAPHY N/A 04/24/2019   Procedure: LEFT HEART CATH AND CORONARY ANGIOGRAPHY;  Surgeon: Leonie Man, MD;  Location: [facility];  Service: Cardiovascular;  Laterality: N/A;     Current Outpatient Medications  Medication Sig Dispense Refill   acetaZOLAMIDE ER (DIAMOX) 500 MG capsule Take 500 mg by mouth 2 (two) times daily.     aspirin 81 MG chewable tablet Chew 1 tablet (81 mg total) by mouth daily with breakfast. 120 tablet 2   atorvastatin (LIPITOR) 80 MG tablet Take 1 tablet  (80 mg total) by mouth at bedtime. Needs to schedule follow up appointment 30 tablet 0   carvedilol (COREG) 3.125 MG tablet Take 1 tablet (3.125 mg total) by mouth 2 (two) times daily with a meal. Needs to schedule appointment 180 tablet 0   cetirizine (ZYRTEC ALLERGY) 10 MG tablet Take 1 tablet (10 mg total) by mouth daily. 60 tablet 1   citalopram (CELEXA) 20 MG tablet Take 1 tablet (20 mg total) by mouth daily. 90 tablet 3   dapagliflozin propanediol (FARXIGA) 5 MG TABS tablet Take 1 tablet (5 mg total) by mouth daily before breakfast. 90 tablet 2   fluticasone (FLONASE) 50 MCG/ACT nasal spray Place 2 sprays into both nostrils daily. 16 g 6   gabapentin (NEURONTIN) 300 MG capsule Take 1 capsule (300 mg total) by mouth 3 (three) times daily. Needs to schedule appointment 30 capsule 0   insulin aspart (NOVOLOG) 100 UNIT/ML FlexPen Inject 0-10 Units into the skin See admin instructions. insulin aspart (novoLOG) injection 0-10 Units 0-10 Units Subcutaneous, 3 times daily with meals CBG < 70: Implement Hypoglycemia Standing Orders and refer to Hypoglycemia Standing Orders sidebar report  CBG 70 - 120: 0 unit CBG 121 - 150: 0 unit  CBG 151 - 200: 1 unit CBG 201 - 250: 2 units CBG 251 - 300: 4 units CBG 301 - 350: 6 units  CBG 351 - 400: 8 units  CBG > 400: 10 units 15 mL 11   insulin glargine (LANTUS) 100 UNIT/ML injection Inject 0.18 mLs (18 Units total) into the skin at bedtime. 10 mL 4   levETIRAcetam (KEPPRA) 500 MG tablet Take 500 mg by mouth 2 (two) times daily.     Accu-Chek Softclix Lancets lancets Test BS QID Dx E11.9 400 each 3   blood glucose meter kit and supplies Dispense based on patient and insurance preference. Use up to four times daily as directed. (FOR ICD-10 E10.9, E11.9). 1 each 0   Continuous Blood Gluc Receiver (FREESTYLE LIBRE 2 READER) DEVI 1 each by Does not apply route daily. 1 each 0   Continuous Blood Gluc Sensor (FREESTYLE LIBRE 2 SENSOR) MISC 1 each by Does not apply route  every 14 (fourteen) days. 2 each 2   doxycycline (VIBRA-TABS) 100 MG tablet Take 1 tablet (100 mg total) by mouth 2 (two) times daily for 7 days. 1 po bid (Patient not taking: Reported on 02/08/2021) 14 tablet 0   glucose blood (ACCU-CHEK GUIDE) test strip Test BS QID Dx E11.9 400 each 3   Insulin Pen Needle (PEN NEEDLES) 32G X 5 MM MISC Use with Novolog Flexpen as directed 100 each 3   Insulin Syringe-Needle U-100 (TRUEPLUS  INSULIN SYRINGE) 31G X 5/16" 1 ML MISC Use to inject insulin once daily Dx E11.8 100 each 3   isosorbide mononitrate (IMDUR) 60 MG 24 hr tablet Take 1 tablet (60 mg total) by mouth daily. 90 tablet 3   nitroGLYCERIN (NITROSTAT) 0.4 MG SL tablet Place 1 tablet (0.4 mg total) under the tongue every 5 (five) minutes as needed for chest pain. 25 tablet 4   olmesartan (BENICAR) 20 MG tablet Take 1 tablet (20 mg total) by mouth daily. (Patient not taking: Reported on 02/08/2021) 90 tablet 3   Oxcarbazepine (TRILEPTAL) 300 MG tablet Take 1/2 tablet twice a day for 1 week, then increase to 1 tablet twice a day for 1 week, then increase to 2 tablets twice a day and continue (Patient not taking: Reported on 02/08/2021) 120 tablet 5   pantoprazole (PROTONIX) 40 MG tablet Take 1 tablet (40 mg total) by mouth daily. Needs to schedule appointment (Patient not taking: Reported on 02/08/2021) 30 tablet 0   sodium bicarbonate 650 MG tablet Take 650 mg by mouth 2 (two) times daily. (Patient not taking: Reported on 02/08/2021)     No current facility-administered medications for this visit.    Allergies:   Penicillins    ROS:  Please see the history of present illness.   Otherwise, review of systems are positive for none.   All other systems are reviewed and negative.    PHYSICAL EXAM: VS:  BP (!) 164/92   Pulse 87   Ht 5' 7"  (1.702 m)   Wt 149 lb (67.6 kg)   BMI 23.34 kg/m  , BMI Body mass index is 23.34 kg/m. GENERAL:  Well appearing NECK:  No jugular venous distention, waveform  within normal limits, carotid upstroke brisk and symmetric, no bruits, no thyromegaly LUNGS:  Clear to auscultation bilaterally CHEST:  Unremarkable HEART:  PMI not displaced or sustained,S1 and S2 within normal limits, no S3, no S4, no clicks, no rubs, no murmurs ABD:  Flat, positive bowel sounds normal in frequency in pitch, no bruits, no rebound, no guarding, no midline pulsatile mass, no hepatomegaly, no splenomegaly EXT:  2 plus pulses throughout, no edema, no cyanosis no clubbing   EKG:  EKG is ordered today. The ekg ordered today demonstrates sinus rhythm, rate 87, axis within normal limits, intervals within normal limits, old inferior MI, old lateral myocardial infarction.  His EKG is not changed from previous.   Recent Labs: 05/26/2020: TSH 3.290 11/11/2020: ALT 11; BUN 13; Creatinine, Ser 1.84; Hemoglobin 13.4; Platelets 219; Potassium 4.5; Sodium 134    Lipid Panel    Component Value Date/Time   CHOL 143 11/11/2020 1330   TRIG 122 11/11/2020 1330   HDL 34 (L) 11/11/2020 1330   CHOLHDL 4.2 11/11/2020 1330   CHOLHDL 2.1 07/06/2016 0534   VLDL 19 07/06/2016 0534   LDLCALC 87 11/11/2020 1330      Wt Readings from Last 3 Encounters:  02/08/21 149 lb (67.6 kg)  01/20/21 145 lb (65.8 kg)  11/11/20 151 lb 2 oz (68.5 kg)      Other studies Reviewed: Additional studies/ records that were reviewed today include: labs. Review of the above records demonstrates:  Please see elsewhere in the note.     ASSESSMENT AND PLAN:    CAD S/P percutaneous coronary angioplasty:    He is not having any chest pain.  I think he can stop his Plavix and just continue aspirin.   At this point since he says he is  having absolutely no symptoms and very physically active I will not reorder the perfusion study that was suggested.  Chronic kidney disease (CKD):    His last creatinine that I can see was in August which was 1.84 and is stable.   Ischemic cardiomyopathy:   He has been back and forth  between Entresto and ARB.  For some reason the Delene Loll keeps falling off the list and I think he is taking the Benicar.  He is going to go home with a list in his hand and these are instructed to be his medications that he takes every day without fail.  That would be the Benicar if he goes home and notes actually what he is taking.  If it is different on his list he should call us back.  Type 2 diabetes mellitus: A1c was 8.3.  I will defer management to [name], FNP   Preop: He would be acceptable risk for the planned eye surgery  Current medicines are reviewed at length with the patient today.  The patient does not have concerns regarding medicines.  The following changes have been made:  no change  Labs/ tests ordered today include: None  Orders Placed This Encounter  Procedures   EKG 12-Lead      Disposition:   FU with 12 months   Signed, [name], MD  02/08/2021 2:31 PM    [place]

## 2021-02-08 ENCOUNTER — Encounter: Payer: Self-pay | Admitting: Cardiology

## 2021-02-08 ENCOUNTER — Ambulatory Visit (INDEPENDENT_AMBULATORY_CARE_PROVIDER_SITE_OTHER): Payer: Medicaid Other | Admitting: Cardiology

## 2021-02-08 ENCOUNTER — Other Ambulatory Visit: Payer: Self-pay

## 2021-02-08 VITALS — BP 164/92 | HR 87 | Ht 67.0 in | Wt 149.0 lb

## 2021-02-08 DIAGNOSIS — Z794 Long term (current) use of insulin: Secondary | ICD-10-CM

## 2021-02-08 DIAGNOSIS — Z9861 Coronary angioplasty status: Secondary | ICD-10-CM | POA: Diagnosis not present

## 2021-02-08 DIAGNOSIS — I152 Hypertension secondary to endocrine disorders: Secondary | ICD-10-CM | POA: Diagnosis not present

## 2021-02-08 DIAGNOSIS — E1159 Type 2 diabetes mellitus with other circulatory complications: Secondary | ICD-10-CM

## 2021-02-08 DIAGNOSIS — I255 Ischemic cardiomyopathy: Secondary | ICD-10-CM

## 2021-02-08 DIAGNOSIS — N183 Chronic kidney disease, stage 3 unspecified: Secondary | ICD-10-CM | POA: Diagnosis not present

## 2021-02-08 DIAGNOSIS — I2510 Atherosclerotic heart disease of native coronary artery without angina pectoris: Secondary | ICD-10-CM | POA: Diagnosis not present

## 2021-02-08 DIAGNOSIS — E118 Type 2 diabetes mellitus with unspecified complications: Secondary | ICD-10-CM

## 2021-02-08 MED ORDER — ATORVASTATIN CALCIUM 80 MG PO TABS: 80.0000 mg | ORAL_TABLET | Freq: Every day | ORAL | 3 refills | Status: DC

## 2021-02-08 MED ORDER — ISOSORBIDE MONONITRATE ER 60 MG PO TB24: 60.0000 mg | ORAL_TABLET | Freq: Every day | ORAL | 3 refills | Status: DC

## 2021-02-08 MED ORDER — NITROGLYCERIN 0.4 MG SL SUBL: 0.4000 mg | SUBLINGUAL_TABLET | SUBLINGUAL | 4 refills | Status: DC | PRN

## 2021-02-08 MED ORDER — CARVEDILOL 3.125 MG PO TABS: 3.1250 mg | ORAL_TABLET | Freq: Two times a day (BID) | ORAL | 3 refills | Status: DC

## 2021-02-08 NOTE — Patient Instructions (Signed)
Medication Instructions:  Please discontinue your Plavix. Continue all other medications as listed.  *If you need a refill on your cardiac medications before your next appointment, please call your pharmacy*  Follow-Up: At [facility], you and your health needs are our priority.  As part of our continuing mission to provide you with exceptional heart care, we have created designated Provider Care Teams.  These Care Teams include your primary Cardiologist (physician) and Advanced Practice Providers (APPs -  Physician Assistants and Nurse Practitioners) who all work together to provide you with the care you need, when you need it.  We recommend signing up for the patient portal called "MyChart".  Sign up information is provided on this After Visit Summary.  MyChart is used to connect with patients for Virtual Visits (Telemedicine).  Patients are able to view lab/test results, encounter notes, upcoming appointments, etc.  Non-urgent messages can be sent to your provider as well.   To learn more about what you can do with MyChart, go to [email or web address].    Your next appointment:   1 year(s)  The format for your next appointment:   In Person  Provider:   Rollene Rotunda, MD    Thank you for choosing [facility]!!

## 2021-02-23 NOTE — Telephone Encounter (Signed)
[employer] was calling to follow up on the status of the surgical clearance.  Please fax clearance to [phone]

## 2021-02-23 NOTE — Telephone Encounter (Signed)
    Patient Name: [name]  DOB: 27-May-1970 MRN: [id number]  Primary Cardiologist: Rollene Rotunda, MD  Chart reviewed as part of pre-operative protocol coverage. Patient was recently seen by Dr. Antoine Poche on 02/08/2021 at which time upcoming eye procedure was discussed. Per Dr. Antoine Poche, he is at acceptable risk for the planned eye surgery. Plavix was stopped at this visit and he was advised to continue Aspirin alone.  Regarding Aspirin therapy, we typically recommend continuation of Aspirin throughout the perioperative period.  However, if the surgeon feels that cessation of Aspirin is required in the perioperative period, it may be stopped 5-7 days prior to surgery with a plan to resume it as soon as felt to be feasible from a surgical standpoint in the post-operative period.  I will route this recommendation to the requesting party via Epic fax function and remove from pre-op pool.  Please call with questions.  Corrin Parker, PA-C 02/23/2021, 2:15 PM

## 2021-03-13 DIAGNOSIS — H4051X4 Glaucoma secondary to other eye disorders, right eye, indeterminate stage: Secondary | ICD-10-CM | POA: Diagnosis not present

## 2021-04-15 DIAGNOSIS — R4020 Unspecified coma: Secondary | ICD-10-CM | POA: Diagnosis not present

## 2021-04-15 DIAGNOSIS — R739 Hyperglycemia, unspecified: Secondary | ICD-10-CM | POA: Diagnosis not present

## 2021-04-15 DIAGNOSIS — R42 Dizziness and giddiness: Secondary | ICD-10-CM | POA: Diagnosis not present

## 2021-04-15 DIAGNOSIS — R531 Weakness: Secondary | ICD-10-CM | POA: Diagnosis not present

## 2021-04-15 DIAGNOSIS — I10 Essential (primary) hypertension: Secondary | ICD-10-CM | POA: Diagnosis not present

## 2021-05-02 ENCOUNTER — Telehealth: Payer: Self-pay | Admitting: *Deleted

## 2021-05-02 NOTE — Telephone Encounter (Signed)
Denied today PA Case: [id number], Status: Denied. Notification: Completed.

## 2021-05-02 NOTE — Telephone Encounter (Signed)
Key: [id number]  FreeStyle Libre 2 Sensor Sent to plan

## 2021-05-03 NOTE — Telephone Encounter (Signed)
Insurance denied because he has to show improvement of HgbA1c since using the [employer]. Patient has not followed up and they will not approve until he is seen. I have left message for them to call back to schedule a follow up

## 2021-05-03 NOTE — Telephone Encounter (Signed)
Can we check back on this? He is on insulin QID - this should qualify him for a freestyle libre.

## 2021-05-05 ENCOUNTER — Ambulatory Visit: Payer: Medicaid Other | Admitting: Family Medicine

## 2021-05-05 ENCOUNTER — Encounter: Payer: Self-pay | Admitting: Family Medicine

## 2021-05-05 VITALS — BP 138/90 | HR 88 | Temp 97.8°F | Ht 67.0 in | Wt 146.4 lb

## 2021-05-05 DIAGNOSIS — E1159 Type 2 diabetes mellitus with other circulatory complications: Secondary | ICD-10-CM | POA: Diagnosis not present

## 2021-05-05 DIAGNOSIS — N183 Chronic kidney disease, stage 3 unspecified: Secondary | ICD-10-CM | POA: Diagnosis not present

## 2021-05-05 DIAGNOSIS — E1165 Type 2 diabetes mellitus with hyperglycemia: Secondary | ICD-10-CM | POA: Diagnosis not present

## 2021-05-05 DIAGNOSIS — I25110 Atherosclerotic heart disease of native coronary artery with unstable angina pectoris: Secondary | ICD-10-CM | POA: Diagnosis not present

## 2021-05-05 DIAGNOSIS — F419 Anxiety disorder, unspecified: Secondary | ICD-10-CM

## 2021-05-05 DIAGNOSIS — K219 Gastro-esophageal reflux disease without esophagitis: Secondary | ICD-10-CM

## 2021-05-05 DIAGNOSIS — F3342 Major depressive disorder, recurrent, in full remission: Secondary | ICD-10-CM | POA: Diagnosis not present

## 2021-05-05 DIAGNOSIS — Z794 Long term (current) use of insulin: Secondary | ICD-10-CM | POA: Diagnosis not present

## 2021-05-05 DIAGNOSIS — I2510 Atherosclerotic heart disease of native coronary artery without angina pectoris: Secondary | ICD-10-CM | POA: Diagnosis not present

## 2021-05-05 DIAGNOSIS — Z9861 Coronary angioplasty status: Secondary | ICD-10-CM

## 2021-05-05 DIAGNOSIS — E785 Hyperlipidemia, unspecified: Secondary | ICD-10-CM

## 2021-05-05 DIAGNOSIS — I152 Hypertension secondary to endocrine disorders: Secondary | ICD-10-CM | POA: Diagnosis not present

## 2021-05-05 DIAGNOSIS — E118 Type 2 diabetes mellitus with unspecified complications: Secondary | ICD-10-CM | POA: Diagnosis not present

## 2021-05-05 DIAGNOSIS — L739 Follicular disorder, unspecified: Secondary | ICD-10-CM

## 2021-05-05 DIAGNOSIS — E1169 Type 2 diabetes mellitus with other specified complication: Secondary | ICD-10-CM | POA: Diagnosis not present

## 2021-05-05 LAB — LIPID PANEL

## 2021-05-05 LAB — BAYER DCA HB A1C WAIVED: HB A1C (BAYER DCA - WAIVED): 7.9 % — ABNORMAL HIGH (ref 4.8–5.6)

## 2021-05-05 MED ORDER — DAPAGLIFLOZIN PROPANEDIOL 10 MG PO TABS: 10.0000 mg | ORAL_TABLET | Freq: Every day | ORAL | 1 refills | Status: DC

## 2021-05-05 MED ORDER — GABAPENTIN 300 MG PO CAPS: 300.0000 mg | ORAL_CAPSULE | Freq: Three times a day (TID) | ORAL | 1 refills | Status: DC

## 2021-05-05 MED ORDER — SULFAMETHOXAZOLE-TRIMETHOPRIM 800-160 MG PO TABS: 1.0000 | ORAL_TABLET | Freq: Two times a day (BID) | ORAL | 0 refills | Status: AC

## 2021-05-05 MED ORDER — PANTOPRAZOLE SODIUM 40 MG PO TBEC: 40.0000 mg | DELAYED_RELEASE_TABLET | Freq: Every day | ORAL | 1 refills | Status: DC

## 2021-05-05 NOTE — Progress Notes (Signed)
Assessment & Plan:  1. Type 2 diabetes mellitus with hyperglycemia, with long-term current use of insulin (HCC) Lab Results  Component Value Date   HGBA1C 7.9 (H) 05/05/2021   HGBA1C 8.3 (H) 11/11/2020   HGBA1C 9.2 (H) 05/26/2020    - Diabetes is not at goal of A1c < 7. - Medications: continue current medications with an increase in [place] from 5 mg to 10 mg daily. - Home glucose monitoring: Encouraged to continue using the freestyle libre and to make sure he brings his reader with him to each appointment. - Patient is currently taking a statin. Patient is taking an ACE-inhibitor/ARB.  - Instruction/counseling given: reminded to get eye exam  Diabetes Health Maintenance Due  Topic Date Due   OPHTHALMOLOGY EXAM  03/07/2019   FOOT EXAM  05/26/2021   HEMOGLOBIN A1C  11/02/2021    Lab Results  Component Value Date   LABMICR 14.6 09/18/2016   - Lipid panel - CBC with Differential/Platelet - CMP14+EGFR - Bayer DCA Hb A1c Waived - Microalbumin / creatinine urine ratio - dapagliflozin propanediol (FARXIGA) 10 MG TABS tablet; Take 1 tablet (10 mg total) by mouth daily before breakfast.  Dispense: 90 tablet; Refill: 1  2. Hypertension associated with diabetes ([name]) Well controlled on current regimen.  - Lipid panel - CBC with Differential/Platelet - CMP14+EGFR  3. Chronic kidney disease (CKD), active medical management without dialysis, stage 3 (moderate) (HCC) Farxiga increased from 5 mg to 10 mg daily. - CMP14+EGFR - dapagliflozin propanediol (FARXIGA) 10 MG TABS tablet; Take 1 tablet (10 mg total) by mouth daily before breakfast.  Dispense: 90 tablet; Refill: 1  4-5. CAD S/P percutaneous coronary angioplasty/Atherosclerosis of native coronary artery of native heart with unstable angina pectoris (HCC) Continue atorvastatin and aspirin. - Lipid panel - CMP14+EGFR  6. Hyperlipidemia associated with type 2 diabetes mellitus ([name]) Well controlled on current regimen.  - Lipid  panel - CMP14+EGFR  7. Anxiety Well controlled on current regimen.  - CMP14+EGFR  8. Recurrent major depressive disorder, in full remission ([name]) Well controlled on current regimen.  - CMP14+EGFR  9. Folliculitis - sulfamethoxazole-trimethoprim (BACTRIM DS) 800-160 MG tablet; Take 1 tablet by mouth 2 (two) times daily for 10 days.  Dispense: 20 tablet; Refill: 0 - Ambulatory referral to Dermatology  10. Gastroesophageal reflux disease, unspecified whether esophagitis present Well controlled on current regimen.  - pantoprazole (PROTONIX) 40 MG tablet; Take 1 tablet (40 mg total) by mouth daily.  Dispense: 90 tablet; Refill: 1   Return in about 3 months (around 08/02/2021) for follow-up of chronic medication conditions.  Kevin Limes, MSN, APRN, [facility]  Subjective:    Patient ID: [name], male    DOB: Aug 13, 1970, 51 y.o.   MRN: [id number]  Patient Care Team: [name] as PCP - General (Family Medicine) Kevin Breeding, MD as PCP - Cardiology (Cardiology) Kevin Sprang, MD as Consulting Physician (Neurology)   Chief Complaint:  Chief Complaint  Patient presents with   Medical Management of Chronic Issues   folliculitis    Patient was seen 11/9 and states it is worse     HPI: [name] is a 51 y.o. male presenting on 05/05/2021 for Medical Management of Chronic Issues and folliculitis (Patient was seen 11/9 and states it is worse )  Patient is accompanied by his wife who he is okay with being present.   Hypertension: patient does not check his blood pressure at home as he has lost  his cuff.   CAD/Coronary atherosclerosis of native coronary artery/Hyperlipidemia: taking atorvastatin and aspirin daily.   CKD: taking Farxiga 5 mg daily.   GERD: taking protonix 30 mg daily.   Diabetes: Patient presents for follow up of diabetes. Current symptoms include: paresthesia of the feet. Known diabetic complications:  retinopathy, peripheral neuropathy, and cardiovascular disease. Medication compliance: yes. Current diet: in general, a "healthy" diet  . Current exercise: walking. Home blood sugar records:  has the freestyle [place], but did not bring it with him today . Is he  on ACE inhibitor or angiotensin II receptor blocker? Yes ([name]). Is he on a statin? Yes (Atorvastatin).   Depression/Anxiety: Doing well with Celexa.  Depression screen [facility] 2/9 05/05/2021 01/20/2021 11/11/2020  Decreased Interest 0 2 0  Down, Depressed, Hopeless 0 2 0  PHQ - 2 Score 0 4 0  Altered sleeping 0 2 0  Tired, decreased energy 0 2 1  Change in appetite 0 2 0  Feeling bad or failure about yourself  0 0 0  Trouble concentrating 0 0 0  Moving slowly or fidgety/restless 0 0 0  Suicidal thoughts 0 0 0  PHQ-9 Score 0 10 1  Difficult doing work/chores Not difficult at all Not difficult at all Somewhat difficult   GAD 7 : Generalized Anxiety Score 05/05/2021 01/20/2021 11/11/2020 08/29/2020  Nervous, Anxious, on Edge 0 2 0 1  Control/stop worrying 0 0 0 0  Worry too much - different things 0 0 0 0  Trouble relaxing 0 0 0 0  Restless 0 0 0 0  Easily annoyed or irritable 2 3 1 1   Afraid - awful might happen 0 0 0 0  Total GAD 7 Score 2 5 1 2   Anxiety Difficulty Somewhat difficult Not difficult at all Somewhat difficult Somewhat difficult    New complaints: Patient reports a rash on his back that started approximately 6 months ago. He has had a round of doxycycline and Keflex, which she reports improved his back but did not ever clear it completely.  He has never seen a dermatologist for this as he has been unable to find one that accepts Medicaid.   Social history:  Relevant past medical, surgical, family and social history reviewed and updated as indicated. Interim medical history since our last visit reviewed.  Allergies and medications reviewed and updated.  DATA REVIEWED: CHART IN EPIC  ROS: Negative unless  specifically indicated above in HPI.    Current Outpatient Medications:    Accu-Chek Softclix Lancets lancets, Test BS QID Dx E11.9, Disp: 400 each, Rfl: 3   acetaZOLAMIDE ER (DIAMOX) 500 MG capsule, Take 500 mg by mouth 2 (two) times daily., Disp: , Rfl:    aspirin 81 MG chewable tablet, Chew 1 tablet (81 mg total) by mouth daily with breakfast., Disp: 120 tablet, Rfl: 2   atorvastatin (LIPITOR) 80 MG tablet, Take 1 tablet (80 mg total) by mouth at bedtime., Disp: 90 tablet, Rfl: 3   blood glucose meter kit and supplies, Dispense based on patient and insurance preference. Use up to four times daily as directed. (FOR ICD-10 E10.9, E11.9)., Disp: 1 each, Rfl: 0   carvedilol (COREG) 3.125 MG tablet, Take 1 tablet (3.125 mg total) by mouth 2 (two) times daily with a meal., Disp: 180 tablet, Rfl: 3   cetirizine (ZYRTEC ALLERGY) 10 MG tablet, Take 1 tablet (10 mg total) by mouth daily., Disp: 60 tablet, Rfl: 1   citalopram (CELEXA) 20 MG tablet, Take 1 tablet (  20 mg total) by mouth daily., Disp: 90 tablet, Rfl: 3   Continuous Blood Gluc Receiver (FREESTYLE LIBRE 2 READER) DEVI, 1 each by Does not apply route daily., Disp: 1 each, Rfl: 0   Continuous Blood Gluc Sensor (FREESTYLE LIBRE 2 SENSOR) MISC, 1 each by Does not apply route every 14 (fourteen) days., Disp: 2 each, Rfl: 2   dapagliflozin propanediol (FARXIGA) 5 MG TABS tablet, Take 1 tablet (5 mg total) by mouth daily before breakfast., Disp: 90 tablet, Rfl: 2   fluticasone (FLONASE) 50 MCG/ACT nasal spray, Place 2 sprays into both nostrils daily., Disp: 16 g, Rfl: 6   gabapentin (NEURONTIN) 300 MG capsule, Take 1 capsule (300 mg total) by mouth 3 (three) times daily. Needs to schedule appointment, Disp: 30 capsule, Rfl: 0   glucose blood (ACCU-CHEK GUIDE) test strip, Test BS QID Dx E11.9, Disp: 400 each, Rfl: 3   insulin aspart (NOVOLOG) 100 UNIT/ML FlexPen, Inject 0-10 Units into the skin See admin instructions. insulin aspart (novoLOG) injection  0-10 Units 0-10 Units Subcutaneous, 3 times daily with meals CBG < 70: Implement Hypoglycemia Standing Orders and refer to Hypoglycemia Standing Orders sidebar report  CBG 70 - 120: 0 unit CBG 121 - 150: 0 unit  CBG 151 - 200: 1 unit CBG 201 - 250: 2 units CBG 251 - 300: 4 units CBG 301 - 350: 6 units  CBG 351 - 400: 8 units  CBG > 400: 10 units, Disp: 15 mL, Rfl: 11   insulin glargine (LANTUS) 100 UNIT/ML injection, Inject 0.18 mLs (18 Units total) into the skin at bedtime., Disp: 10 mL, Rfl: 4   Insulin Pen Needle (PEN NEEDLES) 32G X 5 MM MISC, Use with Novolog Flexpen as directed, Disp: 100 each, Rfl: 3   Insulin Syringe-Needle U-100 (TRUEPLUS INSULIN SYRINGE) 31G X 5/16" 1 ML MISC, Use to inject insulin once daily Dx E11.8, Disp: 100 each, Rfl: 3   isosorbide mononitrate (IMDUR) 60 MG 24 hr tablet, Take 1 tablet (60 mg total) by mouth daily., Disp: 90 tablet, Rfl: 3   levETIRAcetam (KEPPRA) 500 MG tablet, Take 500 mg by mouth 2 (two) times daily., Disp: , Rfl:    nitroGLYCERIN (NITROSTAT) 0.4 MG SL tablet, Place 1 tablet (0.4 mg total) under the tongue every 5 (five) minutes as needed for chest pain., Disp: 25 tablet, Rfl: 4   [name] (BENICAR) 20 MG tablet, Take 1 tablet (20 mg total) by mouth daily., Disp: 90 tablet, Rfl: 3   Oxcarbazepine (TRILEPTAL) 300 MG tablet, Take 1/2 tablet twice a day for 1 week, then increase to 1 tablet twice a day for 1 week, then increase to 2 tablets twice a day and continue, Disp: 120 tablet, Rfl: 5   pantoprazole (PROTONIX) 40 MG tablet, Take 1 tablet (40 mg total) by mouth daily. Needs to schedule appointment, Disp: 30 tablet, Rfl: 0   sodium bicarbonate 650 MG tablet, Take 650 mg by mouth 2 (two) times daily., Disp: , Rfl:    Allergies  Allergen Reactions   Penicillins Rash        Past Medical History:  Diagnosis Date   Alcohol abuse    CAD (coronary artery disease), native coronary artery    a. 01/2009 s/p Anterior MI and thrombectomy with BMS to mid  LAD;  b. 06/2016 Cath/PCI: LM nl, LAD 73md, LCX nl, OM1/2/3 nl, RDA 614mInitial attempt @ PCI failed followed by successful CTO LAD PCI and DES (2.5x38 Synergy)-->rec for indefinte DAPT.  CKD (chronic kidney disease), stage III ([place])    Essential hypertension    History of nonadherence to medical treatment    Hyperlipidemia    Ischemic cardiomyopathy    a. EF 40% by nuc in 2012;  b. 06/2016 Echo: EF 40-45%, apicalanteroseptal and apical AK, Gr2 DD, mod Ca2+ Ao annulus, mild MR.   Slurred speech - Chronic    Type 2 diabetes mellitus ([place])     Past Surgical History:  Procedure Laterality Date   CORONARY CTO INTERVENTION N/A 07/11/2016   Procedure: Coronary CTO Intervention;  Surgeon: Peter M Martinique, MD;  Location: [place] [facility];  Service: Cardiovascular;  Laterality: N/A;   CORONARY STENT INTERVENTION N/A 07/09/2016   Procedure: Coronary Stent Intervention;  Surgeon: Lorretta Harp, MD;  Location: [place] [facility];  Service: Cardiovascular;  Laterality: N/A;   CORONARY STENT INTERVENTION N/A 04/24/2019   Procedure: CORONARY STENT INTERVENTION;  Surgeon: Leonie Man, MD;  Location: [place] [facility];  Service: Cardiovascular;  Laterality: N/A;   Knee arthroscopic surgery Right    LEFT HEART CATH AND CORONARY ANGIOGRAPHY N/A 07/05/2016   Procedure: Left Heart Cath and Coronary Angiography;  Surgeon: Belva Crome, MD;  Location: [place] [facility];  Service: Cardiovascular;  Laterality: N/A;   LEFT HEART CATH AND CORONARY ANGIOGRAPHY N/A 04/24/2019   Procedure: LEFT HEART CATH AND CORONARY ANGIOGRAPHY;  Surgeon: Leonie Man, MD;  Location: [place] [facility];  Service: Cardiovascular;  Laterality: N/A;    Social History   Socioeconomic History   Marital status: Married    Spouse name: Not on file   Number of children: Not on file   Years of education: Not on file   Highest education level: Not on file  Occupational History   Not on file  Tobacco Use   Smoking status:  Never   Smokeless tobacco: Never  Vaping Use   Vaping Use: Never used  Substance and Sexual Activity   Alcohol use: No    Alcohol/week: 1.0 standard drink    Types: 1 Cans of beer per week   Drug use: No   Sexual activity: Yes  Other Topics Concern   Not on file  Social History Narrative   Married with one child   Lives in [place], [place]   Right Handed   One story home   Drinks some caffeine (Green Tea)   Social Determinants of Health   Financial Resource Strain: Not on file  Food Insecurity: Not on file  Transportation Needs: Not on file  Physical Activity: Not on file  Stress: Not on file  Social Connections: Not on file  Intimate Partner Violence: Not on file        Objective:    BP 138/90    Pulse 88    Temp 97.8 F (36.6 C) (Temporal)    Ht 5' 7"  (1.702 m)    Wt 146 lb 6.4 oz (66.4 kg)    BMI 22.93 kg/m   Wt Readings from Last 3 Encounters:  05/05/21 146 lb 6.4 oz (66.4 kg)  02/08/21 149 lb (67.6 kg)  01/20/21 145 lb (65.8 kg)    Physical Exam Vitals reviewed.  Constitutional:      General: He is not in acute distress.    Appearance: Normal appearance. He is normal weight. He is not ill-appearing, toxic-appearing or diaphoretic.  HENT:     Head: Normocephalic and atraumatic.  Eyes:     General: No scleral icterus.  Right eye: No discharge.        Left eye: No discharge.     Conjunctiva/sclera: Conjunctivae normal.  Cardiovascular:     Rate and Rhythm: Normal rate and regular rhythm.     Heart sounds: Normal heart sounds. No murmur heard.   No friction rub. No gallop.  Pulmonary:     Effort: Pulmonary effort is normal. No respiratory distress.     Breath sounds: Normal breath sounds. No stridor. No wheezing, rhonchi or rales.  Musculoskeletal:        General: Normal range of motion.     Cervical back: Normal range of motion.  Skin:    General: Skin is warm and dry.     Findings: Acne (back) and rash present. Rash is papular and pustular.   Neurological:     Mental Status: He is alert and oriented to person, place, and time. Mental status is at baseline.  Psychiatric:        Attention and Perception: Attention and perception normal.        Mood and Affect: Mood normal.        Behavior: Behavior normal.        Thought Content: Thought content normal.        Judgment: Judgment normal.    Lab Results  Component Value Date   TSH 3.290 05/26/2020   Lab Results  Component Value Date   WBC 7.5 11/11/2020   HGB 13.4 11/11/2020   HCT 39.9 11/11/2020   MCV 83 11/11/2020   PLT 219 11/11/2020   Lab Results  Component Value Date   NA 134 11/11/2020   K 4.5 11/11/2020   CO2 17 (L) 11/11/2020   GLUCOSE 532 (HH) 11/11/2020   BUN 13 11/11/2020   CREATININE 1.84 (H) 11/11/2020   BILITOT 0.4 11/11/2020   ALKPHOS 91 11/11/2020   AST 14 11/11/2020   ALT 11 11/11/2020   PROT 6.1 11/11/2020   ALBUMIN 3.9 (L) 11/11/2020   CALCIUM 7.9 (L) 11/11/2020   ANIONGAP 7 09/12/2019   EGFR 44 (L) 11/11/2020   Lab Results  Component Value Date   CHOL 143 11/11/2020   Lab Results  Component Value Date   HDL 34 (L) 11/11/2020   Lab Results  Component Value Date   LDLCALC 87 11/11/2020   Lab Results  Component Value Date   TRIG 122 11/11/2020   Lab Results  Component Value Date   CHOLHDL 4.2 11/11/2020   Lab Results  Component Value Date   HGBA1C 8.3 (H) 11/11/2020

## 2021-05-05 NOTE — Patient Instructions (Signed)
Please schedule your diabetic eye exam.

## 2021-05-06 LAB — CMP14+EGFR
ALT: 12 IU/L (ref 0–44)
AST: 14 IU/L (ref 0–40)
Albumin/Globulin Ratio: 2 (ref 1.2–2.2)
Albumin: 4.5 g/dL (ref 4.0–5.0)
Alkaline Phosphatase: 86 IU/L (ref 44–121)
BUN/Creatinine Ratio: 10 (ref 9–20)
BUN: 19 mg/dL (ref 6–24)
Bilirubin Total: 0.3 mg/dL (ref 0.0–1.2)
CO2: 16 mmol/L — ABNORMAL LOW (ref 20–29)
Calcium: 8.2 mg/dL — ABNORMAL LOW (ref 8.7–10.2)
Chloride: 105 mmol/L (ref 96–106)
Creatinine, Ser: 1.84 mg/dL — ABNORMAL HIGH (ref 0.76–1.27)
Globulin, Total: 2.3 g/dL (ref 1.5–4.5)
Glucose: 131 mg/dL — ABNORMAL HIGH (ref 70–99)
Potassium: 4.2 mmol/L (ref 3.5–5.2)
Sodium: 138 mmol/L (ref 134–144)
Total Protein: 6.8 g/dL (ref 6.0–8.5)
eGFR: 44 mL/min/{1.73_m2} — ABNORMAL LOW (ref >59)

## 2021-05-06 LAB — CBC WITH DIFFERENTIAL/PLATELET
Basophils Absolute: 0.1 10*3/uL (ref 0.0–0.2)
Basos: 1 %
EOS (ABSOLUTE): 0.2 10*3/uL (ref 0.0–0.4)
Eos: 1 %
Hematocrit: 42.2 % (ref 37.5–51.0)
Hemoglobin: 14.4 g/dL (ref 13.0–17.7)
Immature Grans (Abs): 0 10*3/uL (ref 0.0–0.1)
Immature Granulocytes: 0 %
Lymphocytes Absolute: 2.3 10*3/uL (ref 0.7–3.1)
Lymphs: 21 %
MCH: 27.6 pg (ref 26.6–33.0)
MCHC: 34.1 g/dL (ref 31.5–35.7)
MCV: 81 fL (ref 79–97)
Monocytes Absolute: 0.9 10*3/uL (ref 0.1–0.9)
Monocytes: 8 %
Neutrophils Absolute: 7.3 10*3/uL — ABNORMAL HIGH (ref 1.4–7.0)
Neutrophils: 69 %
Platelets: 252 10*3/uL (ref 150–450)
RBC: 5.22 x10E6/uL (ref 4.14–5.80)
RDW: 13.5 % (ref 11.6–15.4)
WBC: 10.7 10*3/uL (ref 3.4–10.8)

## 2021-05-06 LAB — LIPID PANEL
Chol/HDL Ratio: 3.6 ratio (ref 0.0–5.0)
Cholesterol, Total: 166 mg/dL (ref 100–199)
HDL: 46 mg/dL (ref >39)
LDL Chol Calc (NIH): 92 mg/dL (ref 0–99)
Triglycerides: 161 mg/dL — ABNORMAL HIGH (ref 0–149)
VLDL Cholesterol Cal: 28 mg/dL (ref 5–40)

## 2021-05-07 ENCOUNTER — Encounter: Payer: Self-pay | Admitting: Family Medicine

## 2021-05-21 IMAGING — DX DG KNEE 1-2V*L*
2 series · 2 of 2 positions shown · non-contrast
Comparison: None.

CLINICAL DATA: Chronic left knee pain

EXAM:
LEFT KNEE - 1-2 VIEW

[knee ap]
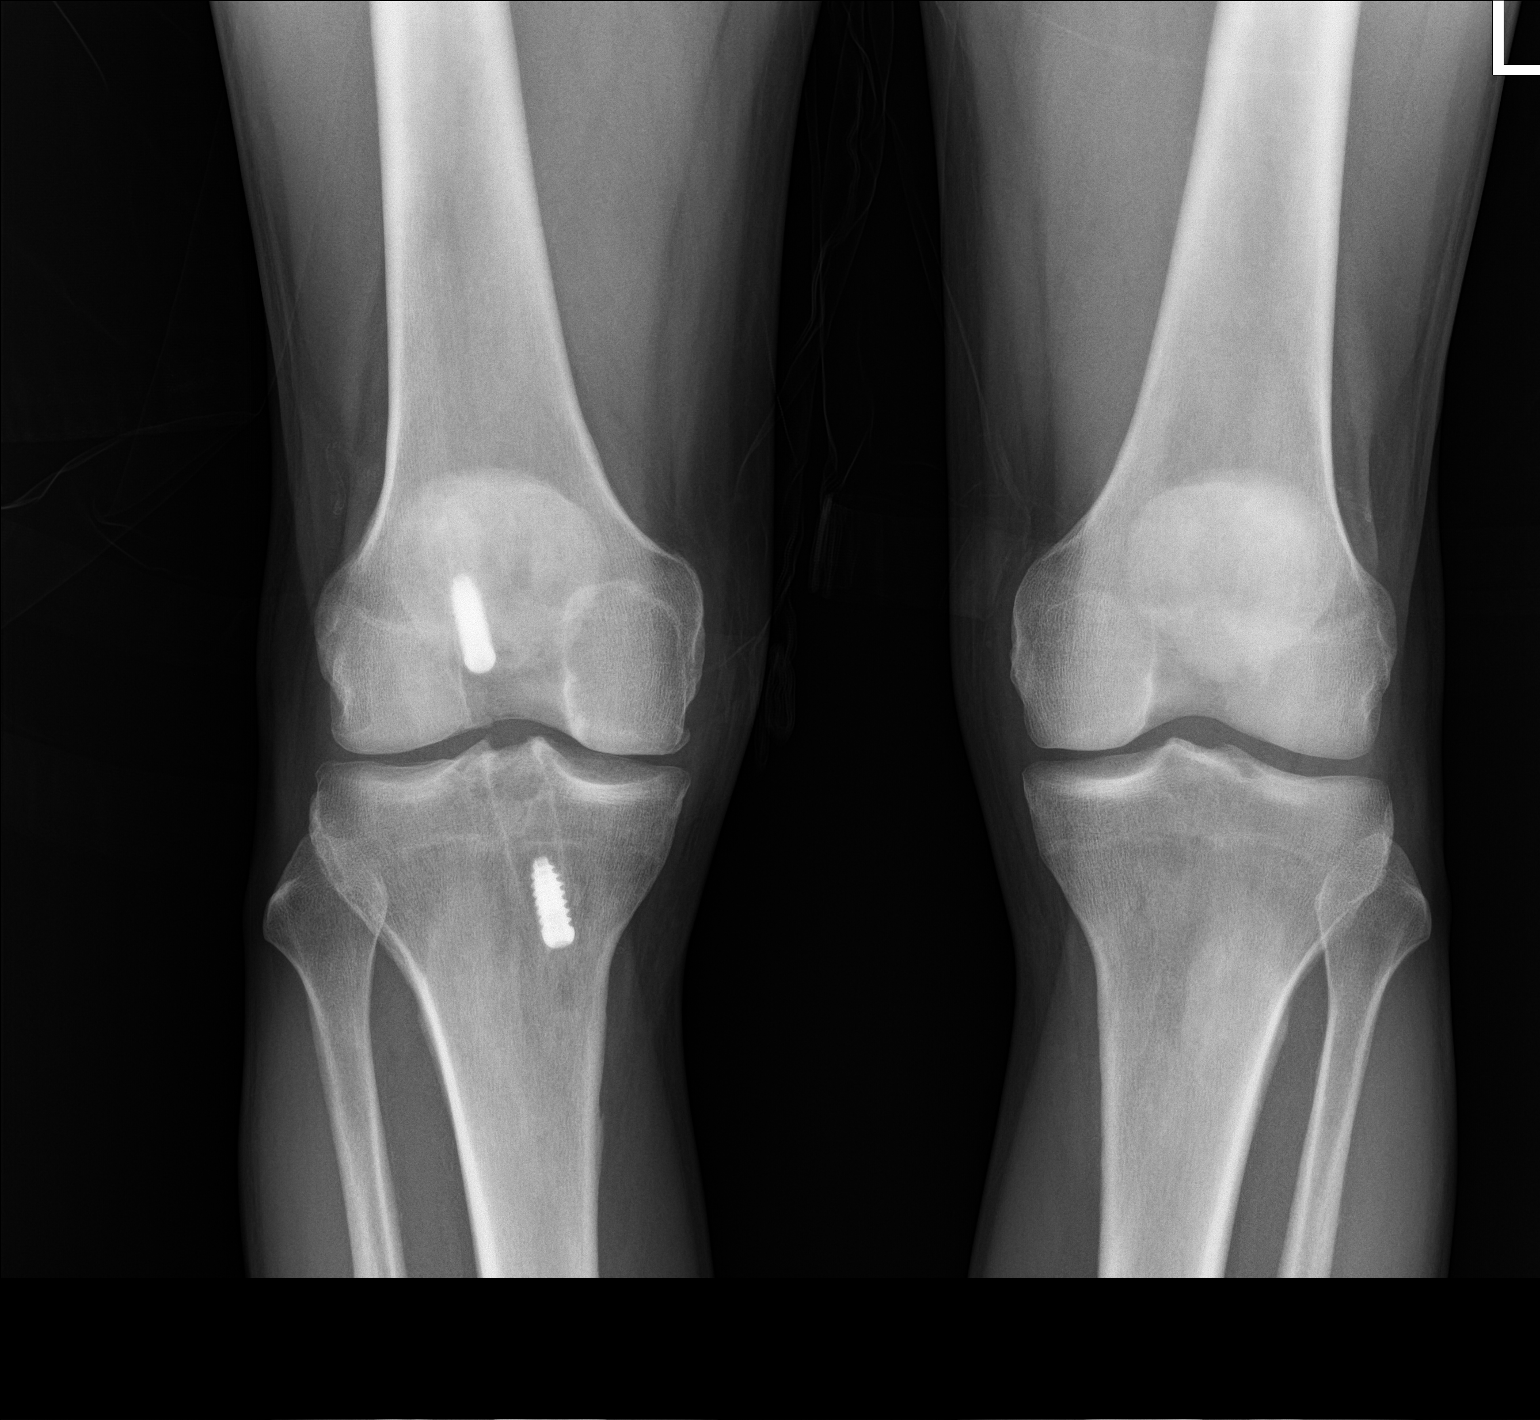

[knee lat]
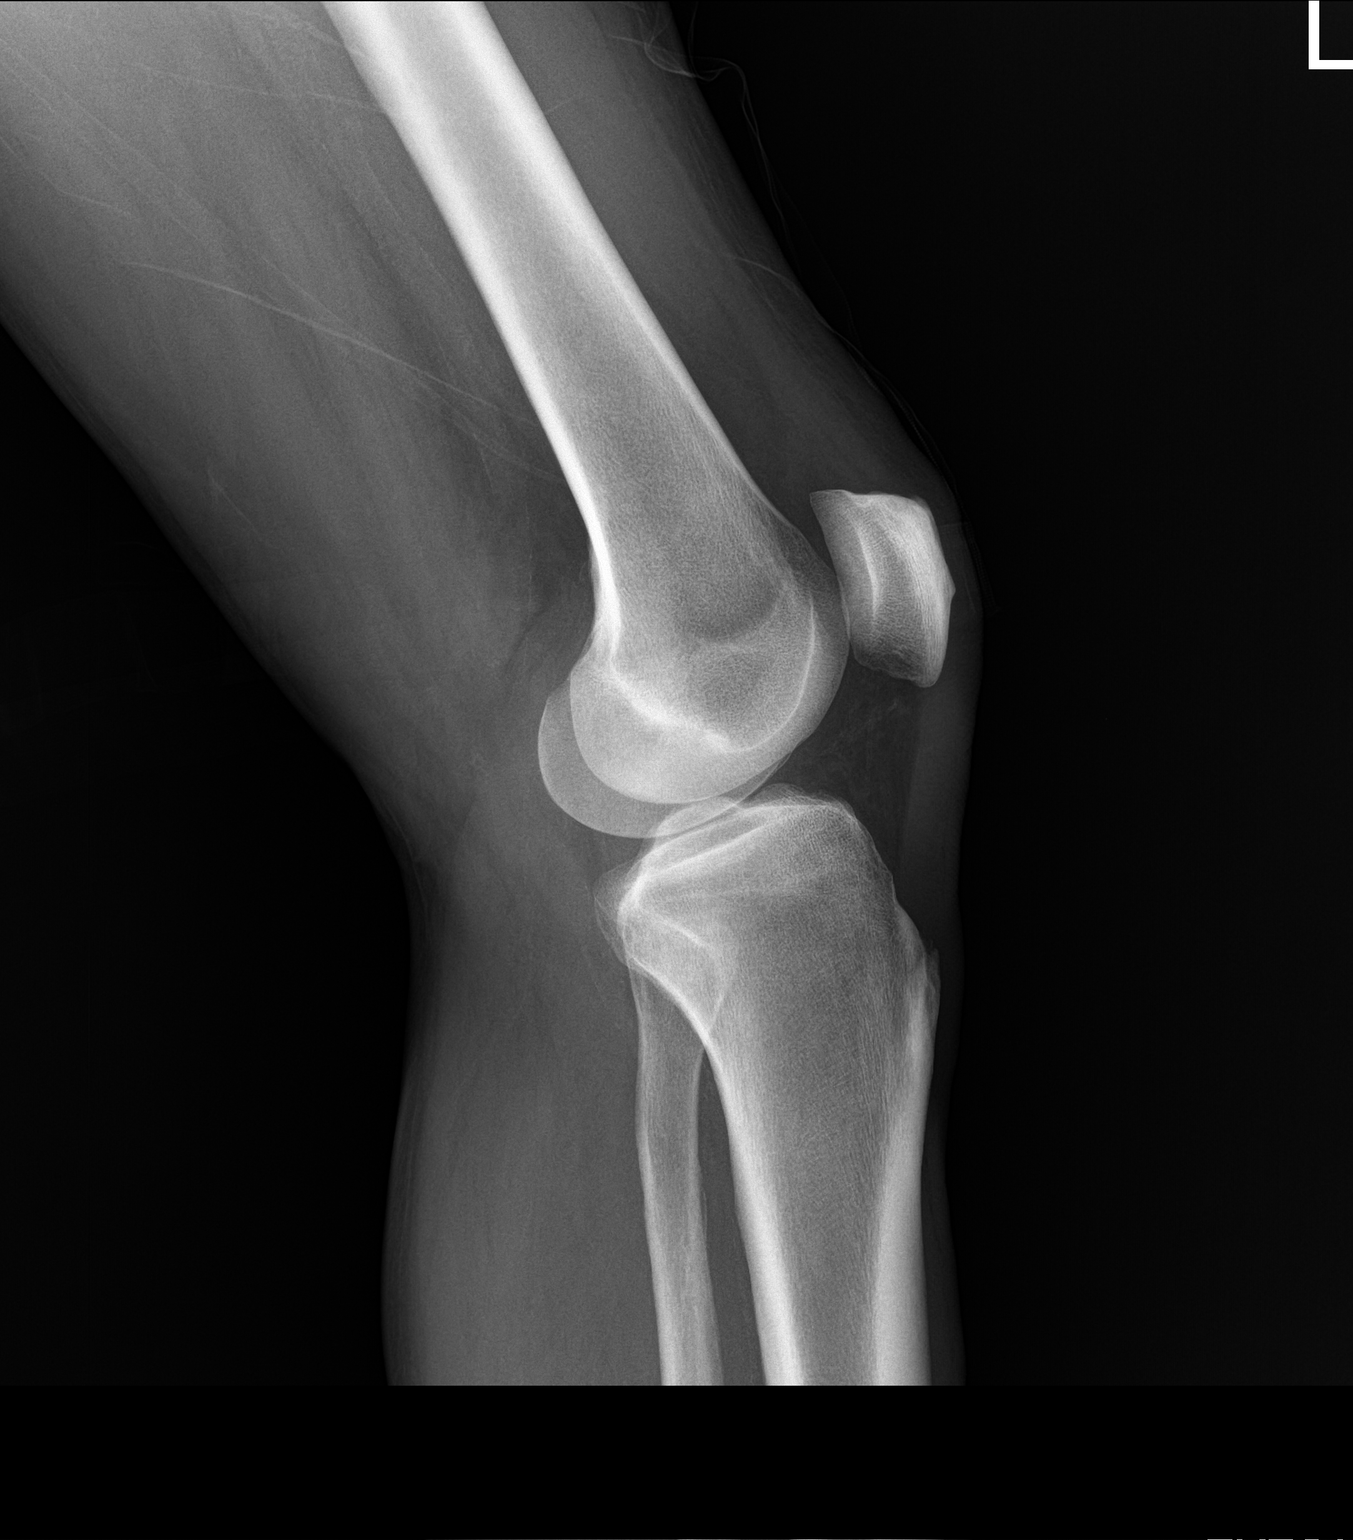

[2 of 2 positions shown; findings below may reference images not displayed]

FINDINGS: Previous ACL repair on the right. Left knee shows no fracture or
malalignment. Joint spaces are maintained. No significant effusion
IMPRESSION: Negative.

## 2021-05-31 ENCOUNTER — Telehealth: Payer: Self-pay | Admitting: Family Medicine

## 2021-05-31 NOTE — Telephone Encounter (Signed)
.. ?  Medicaid Managed Care  ? ?Unsuccessful Outreach Note ? ?05/31/2021 ?Name: [name] MRN: [id number] DOB: 30-May-1970 ? ?Referred by: Gabriel Earing, FNP ?Reason for referral : High Risk Managed Medicaid (I called the patient today to get him scheduled with the MM Team. I left my name and number on his VM.) ? ? ?An unsuccessful telephone outreach was attempted today. The patient was referred to the case management team for assistance with care management and care coordination.  ? ?Follow Up Plan: The care management team will reach out to the patient again over the next 7 days.  ? ?Weston Settle ?Care Guide, High Risk Medicaid Managed Care ?Embedded Care Coordination ?[place]  [facility]  ? ? ? ?

## 2021-06-07 ENCOUNTER — Other Ambulatory Visit: Payer: Self-pay

## 2021-06-07 NOTE — Patient Outreach (Signed)
Care Coordination ? ?06/07/2021 ? ?[name] ?December 07, 1970 ?[id number] ? ?06/07/2021 ?Name: [name] MRN: [id number] DOB: 14-Nov-1970 ? ?Referred by: Gabriel Earing, FNP ?Reason for referral : High Risk Managed Medicaid (Unsuccessful Telephone Outreach) ? ? ?An unsuccessful telephone outreach was attempted today. The patient was referred to the case management team for assistance with care management and care coordination.   ? ?Follow Up Plan: A HIPAA compliant phone message was left for the patient providing contact information and requesting a return call.  The Managed Medicaid care management team will reach out to the patient again over the next 7 - 14 days.  ? ? ?Virgina Norfolk RN, BSN ?Community Care Coordinator ?[place]  [facility] ?Mobile: [phone]  ?

## 2021-06-07 NOTE — Patient Instructions (Signed)
[name] ,  ? ?The [facility] Managed Care Team is available to provide assistance to you with your healthcare needs at no cost and as a benefit of your [facility] Health plan.  ? ?I'm sorry I was unable to reach you today for our scheduled appointment. Our care guide will call you to reschedule our telephone appointment. Please call me at the number below. I am available to be of assistance to you regarding your healthcare needs. .  ? ?Thank you,  ? ?Virgina Norfolk RN, BSN ?Community Care Coordinator ?[place]  [facility] ?Mobile: [phone]  ?

## 2021-06-08 ENCOUNTER — Telehealth: Payer: Self-pay | Admitting: Family Medicine

## 2021-06-08 NOTE — Telephone Encounter (Signed)
.. ?  Medicaid Managed Care  ? ?Unsuccessful Outreach Note ? ?06/08/2021 ?Name: [name] MRN: [id number] DOB: Aug 16, 1970 ? ?Referred by: Gabriel Earing, FNP ?Reason for referral : High Risk Managed Medicaid (I called the patient today to get him rescheduled with the [facility] RNCM. He missed his visit on 06/07/21. I left my name and number on his VM.) ? ? ?A second unsuccessful telephone outreach was attempted today. The patient was referred to the case management team for assistance with care management and care coordination.  ? ?Follow Up Plan: The care management team will reach out to the patient again over the next 7 days.  ? ? ? ?Weston Settle ?Care Guide, High Risk Medicaid Managed Care ?Embedded Care Coordination ?[place]  [facility]  ? ? ? ?

## 2021-06-13 ENCOUNTER — Telehealth: Payer: Self-pay | Admitting: Family Medicine

## 2021-06-13 NOTE — Telephone Encounter (Signed)
.. ?  Medicaid Managed Care  ? ?Unsuccessful Outreach Note ? ?06/13/2021 ?Name: [name] MRN: [id number] DOB: Feb 16, 1971 ? ?Referred by: [name], FNP ?Reason for referral : High Risk Managed Medicaid (I called the patient today to get him rescheduled with the MM RNCM. I left my name and number on his VM.) ? ? ?A second unsuccessful telephone outreach was attempted today. The patient was referred to the case management team for assistance with care management and care coordination.  ? ?Follow Up Plan: The care management team will reach out to the patient again over the next 7 days.  ? ?Reita Chard ?Care Guide, High Risk Medicaid Managed Care ?Embedded Care Coordination ?[place]  ? ? ? ?

## 2021-06-20 NOTE — Progress Notes (Signed)
?  ?Cardiology Office Note ? ? ?Date:  06/21/2021  ? ?ID:  [name], DOB Aug 13, 1970, MRN [id number] ? ?PCP:  [name], FNP  ?Cardiologist:   [name], MD ? ? ?Chief Complaint  ?Patient presents with  ? Chest Pain  ? ? ? ?  ?History of Present Illness: ?[name] is a 51 y.o. male who presents with a hx of coronary disease.  He had an LAD BMS in 2010.  He had in-stent restenosis in 2018 treated with PCI.  Catheterization in January 2021 showed a patent LAD stent with an 80% distal LAD.  He had a 70% proximal RCA and an 85% mid RCA, the RCA was treated with PCI and DES.  The patient also has several other medical issues.  He has insulin-dependent diabetes.  He has treated dyslipidemia.  He has chronic renal insufficiency 3 with a GFR 44.  There is a history of seizures.   His last ejection fraction was 45% by echocardiogram May 2021.  In the past he has had problems with hyperkalemia.  He was on Entresto at one point and this was stopped and he was restarted on Losartan.   ? ?He presents and is reporting increased chest pain.  This started since I last saw him.  He is getting chest discomfort with any emotional stress.  He apparently is quick to get upset.  He has had to take at least 4 nitroglycerin in the last several weeks.  He is somewhat limited in what he can do but he does walk to get the mail and get short of breath minutes of moderate distance.  He is not describing resting shortness of breath, PND or orthopnea.  He is not having any new palpitations, presyncope or syncope.  All of this is similar to what he had prior to his last intervention. ? ? ?Past Medical History:  ?Diagnosis Date  ? Alcohol abuse   ? CAD (coronary artery disease), native coronary artery   ? a. 01/2009 s/p Anterior MI and thrombectomy with BMS to mid LAD;  b. 06/2016 Cath/PCI: LM nl, LAD 54m/d, LCX nl, OM1/2/3 nl, RDA 71m. Initial attempt @ PCI failed followed by successful CTO LAD PCI and DES (2.5x38 Synergy)-->rec  for indefinte DAPT.  ? CKD (chronic kidney disease), stage III ([place])   ? Essential hypertension   ? History of nonadherence to medical treatment   ? Hyperlipidemia   ? Ischemic cardiomyopathy   ? a. EF 40% by nuc in 2012;  b. 06/2016 Echo: EF 40-45%, apicalanteroseptal and apical AK, Gr2 DD, mod Ca2+ Ao annulus, mild MR.  ? Slurred speech - Chronic   ? Type 2 diabetes mellitus ([place])   ? ? ?Past Surgical History:  ?Procedure Laterality Date  ? CORONARY CTO INTERVENTION N/A 07/11/2016  ? Procedure: Coronary CTO Intervention;  Surgeon: Peter M Martinique, MD;  Location: [facility];  Service: Cardiovascular;  Laterality: N/A;  ? CORONARY STENT INTERVENTION N/A 07/09/2016  ? Procedure: Coronary Stent Intervention;  Surgeon: Lorretta Harp, MD;  Location: [place] [facility];  Service: Cardiovascular;  Laterality: N/A;  ? CORONARY STENT INTERVENTION N/A 04/24/2019  ? Procedure: CORONARY STENT INTERVENTION;  Surgeon: Leonie Man, MD;  Location: [facility];  Service: Cardiovascular;  Laterality: N/A;  ? Knee arthroscopic surgery Right   ? LEFT HEART CATH AND CORONARY ANGIOGRAPHY N/A 07/05/2016  ? Procedure: Left Heart Cath and Coronary Angiography;  Surgeon: Belva Crome, MD;  Location:  [facility];  Service: Cardiovascular;  Laterality: N/A;  ? LEFT HEART CATH AND CORONARY ANGIOGRAPHY N/A 04/24/2019  ? Procedure: LEFT HEART CATH AND CORONARY ANGIOGRAPHY;  Surgeon: Leonie Man, MD;  Location: [facility];  Service: Cardiovascular;  Laterality: N/A;  ? ? ? ?Current Outpatient Medications  ?Medication Sig Dispense Refill  ? aspirin 81 MG chewable tablet Chew 1 tablet (81 mg total) by mouth daily with breakfast. 120 tablet 2  ? atorvastatin (LIPITOR) 80 MG tablet Take 1 tablet (80 mg total) by mouth at bedtime. 90 tablet 3  ? carvedilol (COREG) 6.25 MG tablet Take 1 tablet (6.25 mg total) by mouth 2 (two) times daily. 180 tablet 1  ? cetirizine (ZYRTEC ALLERGY) 10 MG tablet Take 1 tablet (10 mg  total) by mouth daily. 60 tablet 1  ? citalopram (CELEXA) 20 MG tablet Take 1 tablet (20 mg total) by mouth daily. 90 tablet 3  ? dapagliflozin propanediol (FARXIGA) 10 MG TABS tablet Take 1 tablet (10 mg total) by mouth daily before breakfast. 90 tablet 1  ? insulin aspart (NOVOLOG) 100 UNIT/ML FlexPen Inject 0-10 Units into the skin See admin instructions. insulin aspart (novoLOG) injection 0-10 Units 0-10 Units Subcutaneous, 3 times daily with meals CBG < 70: Implement Hypoglycemia Standing Orders and refer to Hypoglycemia Standing Orders sidebar report  CBG 70 - 120: 0 unit CBG 121 - 150: 0 unit  CBG 151 - 200: 1 unit CBG 201 - 250: 2 units CBG 251 - 300: 4 units CBG 301 - 350: 6 units  CBG 351 - 400: 8 units  CBG > 400: 10 units 15 mL 11  ? insulin glargine (LANTUS) 100 UNIT/ML injection Inject 0.18 mLs (18 Units total) into the skin at bedtime. 10 mL 4  ? nitroGLYCERIN (NITROSTAT) 0.4 MG SL tablet Place 1 tablet (0.4 mg total) under the tongue every 5 (five) minutes as needed for chest pain. 25 tablet 4  ? Accu-Chek Softclix Lancets lancets Test BS QID Dx E11.9 400 each 3  ? acetaZOLAMIDE ER (DIAMOX) 500 MG capsule Take 500 mg by mouth 2 (two) times daily. (Patient not taking: Reported on 06/21/2021)    ? Continuous Blood Gluc Receiver (FREESTYLE LIBRE 2 READER) DEVI 1 each by Does not apply route daily. 1 each 0  ? Continuous Blood Gluc Sensor (FREESTYLE LIBRE 2 SENSOR) MISC 1 each by Does not apply route every 14 (fourteen) days. 2 each 2  ? fluticasone (FLONASE) 50 MCG/ACT nasal spray Place 2 sprays into both nostrils daily. (Patient not taking: Reported on 06/21/2021) 16 g 6  ? gabapentin (NEURONTIN) 300 MG capsule Take 1 capsule (300 mg total) by mouth 3 (three) times daily. 270 capsule 1  ? glucose blood (ACCU-CHEK GUIDE) test strip Test BS QID Dx E11.9 400 each 3  ? Insulin Pen Needle (PEN NEEDLES) 32G X 5 MM MISC Use with Novolog Flexpen as directed 100 each 3  ? Insulin Syringe-Needle U-100 (TRUEPLUS  INSULIN SYRINGE) 31G X 5/16" 1 ML MISC Use to inject insulin once daily Dx E11.8 100 each 3  ? isosorbide mononitrate (IMDUR) 60 MG 24 hr tablet Take 1 tablet (60 mg total) by mouth daily. 30 tablet 3  ? levETIRAcetam (KEPPRA) 500 MG tablet Take 500 mg by mouth 2 (two) times daily. (Patient not taking: Reported on 06/21/2021)    ? olmesartan (BENICAR) 20 MG tablet Take 1 tablet (20 mg total) by mouth daily. 90 tablet 1  ? Oxcarbazepine (TRILEPTAL)  300 MG tablet Take 1/2 tablet twice a day for 1 week, then increase to 1 tablet twice a day for 1 week, then increase to 2 tablets twice a day and continue (Patient not taking: Reported on 06/21/2021) 120 tablet 5  ? pantoprazole (PROTONIX) 40 MG tablet Take 1 tablet (40 mg total) by mouth daily. (Patient not taking: Reported on 06/21/2021) 90 tablet 1  ? sodium bicarbonate 650 MG tablet Take 650 mg by mouth 2 (two) times daily. (Patient not taking: Reported on 06/21/2021)    ? ?No current facility-administered medications for this visit.  ? ? ?Allergies:   Penicillins  ? ? ?ROS:  Please see the history of present illness.   Otherwise, review of systems are positive for none.   All other systems are reviewed and negative.  ? ? ?PHYSICAL EXAM: ?VS:  BP (!) 162/100   Pulse 83   Ht 5\' 9"  (1.753 m)   Wt 148 lb (67.1 kg)   BMI 21.86 kg/m?  , BMI Body mass index is 21.86 kg/m?. ?GENERAL:  Well appearing ?NECK:  No jugular venous distention, waveform within normal limits, carotid upstroke brisk and symmetric, no bruits, no thyromegaly ?LUNGS:  Clear to auscultation bilaterally ?CHEST:  Unremarkable ?HEART:  PMI not displaced or sustained,S1 and S2 within normal limits, no S3, no S4, no clicks, no rubs, no murmurs ?ABD:  Flat, positive bowel sounds normal in frequency in pitch, no bruits, no rebound, no guarding, no midline pulsatile mass, no hepatomegaly, no splenomegaly ?EXT:  2 plus pulses throughout, no edema, no cyanosis no clubbing ? ? ?EKG:  EKG is  ordered today. ?The ekg  ordered today demonstrates sinus rhythm, rate 83, axis within normal limits, intervals within normal limits, old inferior MI, old lateral myocardial infarction.  His EKG is not changed from previous.  PVCs ?

## 2021-06-20 NOTE — H&P (View-Only) (Signed)
?  ?Cardiology Office Note ? ? ?Date:  06/21/2021  ? ?ID:  [name], DOB 08/16/1970, MRN [id number] ? ?PCP:  [name], FNP  ?Cardiologist:   [name], MD ? ? ?Chief Complaint  ?Patient presents with  ? Chest Pain  ? ? ? ?  ?History of Present Illness: ?[name] is a 51 y.o. male who presents with a hx of coronary disease.  He had an LAD BMS in 2010.  He had in-stent restenosis in 2018 treated with PCI.  Catheterization in January 2021 showed a patent LAD stent with an 80% distal LAD.  He had a 70% proximal RCA and an 85% mid RCA, the RCA was treated with PCI and DES.  The patient also has several other medical issues.  He has insulin-dependent diabetes.  He has treated dyslipidemia.  He has chronic renal insufficiency 3 with a GFR 44.  There is a history of seizures.   His last ejection fraction was 45% by echocardiogram May 2021.  In the past he has had problems with hyperkalemia.  He was on Entresto at one point and this was stopped and he was restarted on Losartan.   ? ?He presents and is reporting increased chest pain.  This started since I last saw him.  He is getting chest discomfort with any emotional stress.  He apparently is quick to get upset.  He has had to take at least 4 nitroglycerin in the last several weeks.  He is somewhat limited in what he can do but he does walk to get the mail and get short of breath minutes of moderate distance.  He is not describing resting shortness of breath, PND or orthopnea.  He is not having any new palpitations, presyncope or syncope.  All of this is similar to what he had prior to his last intervention. ? ? ?Past Medical History:  ?Diagnosis Date  ? Alcohol abuse   ? CAD (coronary artery disease), native coronary artery   ? a. 01/2009 s/p Anterior MI and thrombectomy with BMS to mid LAD;  b. 06/2016 Cath/PCI: LM nl, LAD 75m/d, LCX nl, OM1/2/3 nl, RDA 10m. Initial attempt @ PCI failed followed by successful CTO LAD PCI and DES (2.5x38 Synergy)-->rec  for indefinte DAPT.  ? CKD (chronic kidney disease), stage III ([place])   ? Essential hypertension   ? History of nonadherence to medical treatment   ? Hyperlipidemia   ? Ischemic cardiomyopathy   ? a. EF 40% by nuc in 2012;  b. 06/2016 Echo: EF 40-45%, apicalanteroseptal and apical AK, Gr2 DD, mod Ca2+ Ao annulus, mild MR.  ? Slurred speech - Chronic   ? Type 2 diabetes mellitus ([place])   ? ? ?Past Surgical History:  ?Procedure Laterality Date  ? CORONARY CTO INTERVENTION N/A 07/11/2016  ? Procedure: Coronary CTO Intervention;  Surgeon: Peter M Martinique, MD;  Location: [facility];  Service: Cardiovascular;  Laterality: N/A;  ? CORONARY STENT INTERVENTION N/A 07/09/2016  ? Procedure: Coronary Stent Intervention;  Surgeon: Lorretta Harp, MD;  Location: [facility];  Service: Cardiovascular;  Laterality: N/A;  ? CORONARY STENT INTERVENTION N/A 04/24/2019  ? Procedure: CORONARY STENT INTERVENTION;  Surgeon: Leonie Man, MD;  Location: [facility];  Service: Cardiovascular;  Laterality: N/A;  ? Knee arthroscopic surgery Right   ? LEFT HEART CATH AND CORONARY ANGIOGRAPHY N/A 07/05/2016  ? Procedure: Left Heart Cath and Coronary Angiography;  Surgeon: Belva Crome, MD;  Location:  [facility];  Service: Cardiovascular;  Laterality: N/A;  ? LEFT HEART CATH AND CORONARY ANGIOGRAPHY N/A 04/24/2019  ? Procedure: LEFT HEART CATH AND CORONARY ANGIOGRAPHY;  Surgeon: Leonie Man, MD;  Location: [facility];  Service: Cardiovascular;  Laterality: N/A;  ? ? ? ?Current Outpatient Medications  ?Medication Sig Dispense Refill  ? aspirin 81 MG chewable tablet Chew 1 tablet (81 mg total) by mouth daily with breakfast. 120 tablet 2  ? atorvastatin (LIPITOR) 80 MG tablet Take 1 tablet (80 mg total) by mouth at bedtime. 90 tablet 3  ? carvedilol (COREG) 6.25 MG tablet Take 1 tablet (6.25 mg total) by mouth 2 (two) times daily. 180 tablet 1  ? cetirizine (ZYRTEC ALLERGY) 10 MG tablet Take 1 tablet (10 mg  total) by mouth daily. 60 tablet 1  ? citalopram (CELEXA) 20 MG tablet Take 1 tablet (20 mg total) by mouth daily. 90 tablet 3  ? dapagliflozin propanediol (FARXIGA) 10 MG TABS tablet Take 1 tablet (10 mg total) by mouth daily before breakfast. 90 tablet 1  ? insulin aspart (NOVOLOG) 100 UNIT/ML FlexPen Inject 0-10 Units into the skin See admin instructions. insulin aspart (novoLOG) injection 0-10 Units 0-10 Units Subcutaneous, 3 times daily with meals CBG < 70: Implement Hypoglycemia Standing Orders and refer to Hypoglycemia Standing Orders sidebar report  CBG 70 - 120: 0 unit CBG 121 - 150: 0 unit  CBG 151 - 200: 1 unit CBG 201 - 250: 2 units CBG 251 - 300: 4 units CBG 301 - 350: 6 units  CBG 351 - 400: 8 units  CBG > 400: 10 units 15 mL 11  ? insulin glargine (LANTUS) 100 UNIT/ML injection Inject 0.18 mLs (18 Units total) into the skin at bedtime. 10 mL 4  ? nitroGLYCERIN (NITROSTAT) 0.4 MG SL tablet Place 1 tablet (0.4 mg total) under the tongue every 5 (five) minutes as needed for chest pain. 25 tablet 4  ? Accu-Chek Softclix Lancets lancets Test BS QID Dx E11.9 400 each 3  ? acetaZOLAMIDE ER (DIAMOX) 500 MG capsule Take 500 mg by mouth 2 (two) times daily. (Patient not taking: Reported on 06/21/2021)    ? Continuous Blood Gluc Receiver (FREESTYLE LIBRE 2 READER) DEVI 1 each by Does not apply route daily. 1 each 0  ? Continuous Blood Gluc Sensor (FREESTYLE LIBRE 2 SENSOR) MISC 1 each by Does not apply route every 14 (fourteen) days. 2 each 2  ? fluticasone (FLONASE) 50 MCG/ACT nasal spray Place 2 sprays into both nostrils daily. (Patient not taking: Reported on 06/21/2021) 16 g 6  ? gabapentin (NEURONTIN) 300 MG capsule Take 1 capsule (300 mg total) by mouth 3 (three) times daily. 270 capsule 1  ? glucose blood (ACCU-CHEK GUIDE) test strip Test BS QID Dx E11.9 400 each 3  ? Insulin Pen Needle (PEN NEEDLES) 32G X 5 MM MISC Use with Novolog Flexpen as directed 100 each 3  ? Insulin Syringe-Needle U-100 (TRUEPLUS  INSULIN SYRINGE) 31G X 5/16" 1 ML MISC Use to inject insulin once daily Dx E11.8 100 each 3  ? isosorbide mononitrate (IMDUR) 60 MG 24 hr tablet Take 1 tablet (60 mg total) by mouth daily. 30 tablet 3  ? levETIRAcetam (KEPPRA) 500 MG tablet Take 500 mg by mouth 2 (two) times daily. (Patient not taking: Reported on 06/21/2021)    ? olmesartan (BENICAR) 20 MG tablet Take 1 tablet (20 mg total) by mouth daily. 90 tablet 1  ? Oxcarbazepine (TRILEPTAL)  300 MG tablet Take 1/2 tablet twice a day for 1 week, then increase to 1 tablet twice a day for 1 week, then increase to 2 tablets twice a day and continue (Patient not taking: Reported on 06/21/2021) 120 tablet 5  ? pantoprazole (PROTONIX) 40 MG tablet Take 1 tablet (40 mg total) by mouth daily. (Patient not taking: Reported on 06/21/2021) 90 tablet 1  ? sodium bicarbonate 650 MG tablet Take 650 mg by mouth 2 (two) times daily. (Patient not taking: Reported on 06/21/2021)    ? ?No current facility-administered medications for this visit.  ? ? ?Allergies:   Penicillins  ? ? ?ROS:  Please see the history of present illness.   Otherwise, review of systems are positive for none.   All other systems are reviewed and negative.  ? ? ?PHYSICAL EXAM: ?VS:  BP (!) 162/100   Pulse 83   Ht 5\' 9"  (1.753 m)   Wt 148 lb (67.1 kg)   BMI 21.86 kg/m?  , BMI Body mass index is 21.86 kg/m?. ?GENERAL:  Well appearing ?NECK:  No jugular venous distention, waveform within normal limits, carotid upstroke brisk and symmetric, no bruits, no thyromegaly ?LUNGS:  Clear to auscultation bilaterally ?CHEST:  Unremarkable ?HEART:  PMI not displaced or sustained,S1 and S2 within normal limits, no S3, no S4, no clicks, no rubs, no murmurs ?ABD:  Flat, positive bowel sounds normal in frequency in pitch, no bruits, no rebound, no guarding, no midline pulsatile mass, no hepatomegaly, no splenomegaly ?EXT:  2 plus pulses throughout, no edema, no cyanosis no clubbing ? ? ?EKG:  EKG is  ordered today. ?The ekg  ordered today demonstrates sinus rhythm, rate 83, axis within normal limits, intervals within normal limits, old inferior MI, old lateral myocardial infarction.  His EKG is not changed from previous.  PVCs ?

## 2021-06-21 ENCOUNTER — Encounter: Payer: Self-pay | Admitting: Cardiology

## 2021-06-21 ENCOUNTER — Other Ambulatory Visit: Payer: Medicaid Other

## 2021-06-21 ENCOUNTER — Other Ambulatory Visit: Payer: Self-pay

## 2021-06-21 ENCOUNTER — Ambulatory Visit (INDEPENDENT_AMBULATORY_CARE_PROVIDER_SITE_OTHER): Payer: Medicaid Other | Admitting: Cardiology

## 2021-06-21 ENCOUNTER — Encounter: Payer: Self-pay | Admitting: *Deleted

## 2021-06-21 VITALS — BP 162/100 | HR 83 | Ht 69.0 in | Wt 148.0 lb

## 2021-06-21 DIAGNOSIS — E1159 Type 2 diabetes mellitus with other circulatory complications: Secondary | ICD-10-CM | POA: Diagnosis not present

## 2021-06-21 DIAGNOSIS — Z01812 Encounter for preprocedural laboratory examination: Secondary | ICD-10-CM

## 2021-06-21 DIAGNOSIS — E118 Type 2 diabetes mellitus with unspecified complications: Secondary | ICD-10-CM

## 2021-06-21 DIAGNOSIS — I152 Hypertension secondary to endocrine disorders: Secondary | ICD-10-CM | POA: Diagnosis not present

## 2021-06-21 DIAGNOSIS — I255 Ischemic cardiomyopathy: Secondary | ICD-10-CM | POA: Diagnosis not present

## 2021-06-21 DIAGNOSIS — I25110 Atherosclerotic heart disease of native coronary artery with unstable angina pectoris: Secondary | ICD-10-CM | POA: Diagnosis not present

## 2021-06-21 MED ORDER — OLMESARTAN MEDOXOMIL 20 MG PO TABS: 20.0000 mg | ORAL_TABLET | Freq: Every day | ORAL | 1 refills | Status: DC

## 2021-06-21 MED ORDER — CARVEDILOL 6.25 MG PO TABS: 6.2500 mg | ORAL_TABLET | Freq: Two times a day (BID) | ORAL | 1 refills | Status: DC

## 2021-06-21 MED ORDER — ISOSORBIDE MONONITRATE ER 60 MG PO TB24: 60.0000 mg | ORAL_TABLET | Freq: Every day | ORAL | 3 refills | Status: DC

## 2021-06-21 NOTE — Patient Instructions (Signed)
Medication Instructions:  ?Your physician has recommended you make the following change in your medication:  ?INCREASE Carvedilol to 6.25 mg twice daily ?RESTART Benicar 20 mg once daily ?RESTART Imdur 60 mg once daily ? ?*If you need a refill on your cardiac medications before your next appointment, please call your pharmacy* ? ? ?Lab Work: ?Today: BMET & CBC ?If you have labs (blood work) drawn today and your tests are completely normal, you will receive your results only by: ?MyChart Message (if you have MyChart) OR ?A paper copy in the mail ?If you have any lab test that is abnormal or we need to change your treatment, we will call you to review the results. ? ? ?Testing/Procedures: ?Your physician has requested that you have a cardiac catheterization. Cardiac catheterization is used to diagnose and/or treat various heart conditions. Doctors may recommend this procedure for a number of different reasons. The most common reason is to evaluate chest pain. Chest pain can be a symptom of coronary artery disease (CAD), and cardiac catheterization can show whether plaque is narrowing or blocking your heart?s arteries. This procedure is also used to evaluate the valves, as well as measure the blood flow and oxygen levels in different parts of your heart. For further information please visit [email or web address] Please follow instruction sheet, as given. ? ? ?Follow-Up: ?At [facility], you and your health needs are our priority.  As part of our continuing mission to provide you with exceptional heart care, we have created designated Provider Care Teams.  These Care Teams include your primary Cardiologist (physician) and Advanced Practice Providers (APPs -  Physician Assistants and Nurse Practitioners) who all work together to provide you with the care you need, when you need it. ? ?Your next appointment:   ?08/30/2021  @ 11:20 in [place] ? ?The format for your next appointment:   ?In Person ? ?Provider:   ?Rollene Rotunda, MD ? ? ? ?Thank you for choosing [facility]!! ? ? ?[phone] ? ?  ?

## 2021-06-22 ENCOUNTER — Encounter: Payer: Self-pay | Admitting: *Deleted

## 2021-06-22 LAB — CBC
Hematocrit: 46 % (ref 37.5–51.0)
Hemoglobin: 15.1 g/dL (ref 13.0–17.7)
MCH: 27.6 pg (ref 26.6–33.0)
MCHC: 32.8 g/dL (ref 31.5–35.7)
MCV: 84 fL (ref 79–97)
Platelets: 290 10*3/uL (ref 150–450)
RBC: 5.48 x10E6/uL (ref 4.14–5.80)
RDW: 13.7 % (ref 11.6–15.4)
WBC: 9.2 10*3/uL (ref 3.4–10.8)

## 2021-06-22 LAB — BASIC METABOLIC PANEL
BUN/Creatinine Ratio: 7 — ABNORMAL LOW (ref 9–20)
BUN: 13 mg/dL (ref 6–24)
CO2: 22 mmol/L (ref 20–29)
Calcium: 8.6 mg/dL — ABNORMAL LOW (ref 8.7–10.2)
Chloride: 105 mmol/L (ref 96–106)
Creatinine, Ser: 1.79 mg/dL — ABNORMAL HIGH (ref 0.76–1.27)
Glucose: 156 mg/dL — ABNORMAL HIGH (ref 70–99)
Potassium: 5.2 mmol/L (ref 3.5–5.2)
Sodium: 141 mmol/L (ref 134–144)
eGFR: 46 mL/min/{1.73_m2} — ABNORMAL LOW (ref >59)

## 2021-06-28 ENCOUNTER — Telehealth: Payer: Self-pay | Admitting: Cardiology

## 2021-06-28 ENCOUNTER — Telehealth: Payer: Medicaid Other | Admitting: Family Medicine

## 2021-06-28 ENCOUNTER — Encounter: Payer: Self-pay | Admitting: Family Medicine

## 2021-06-28 DIAGNOSIS — B9689 Other specified bacterial agents as the cause of diseases classified elsewhere: Secondary | ICD-10-CM

## 2021-06-28 DIAGNOSIS — H109 Unspecified conjunctivitis: Secondary | ICD-10-CM | POA: Diagnosis not present

## 2021-06-28 MED ORDER — POLYMYXIN B-TRIMETHOPRIM 10000-0.1 UNIT/ML-% OP SOLN: 1.0000 [drp] | Freq: Four times a day (QID) | OPHTHALMIC | 0 refills | Status: AC

## 2021-06-28 NOTE — Telephone Encounter (Signed)
Pt is needing to reschedule Cath Lab appt.. please advise  ?

## 2021-06-28 NOTE — Telephone Encounter (Signed)
Called pt's wife. She states pt's mother normally takes him to his appointments she has been sick and she thinks it will be a few weeks before they can get a ride. Will call cath lab to cancel.   ?

## 2021-06-28 NOTE — Telephone Encounter (Signed)
Called to reschedule cath. Pt's procedure moved to April 13th with Dr. [place]. Pt knows to be there at 5:30 for pre-hydration, procedure is at 10:30. Pt's wife verbalized understanding.  ?

## 2021-06-28 NOTE — Telephone Encounter (Signed)
LMTCB

## 2021-06-28 NOTE — Telephone Encounter (Signed)
Called cath lab to cancel the procedure.  ?

## 2021-06-28 NOTE — Telephone Encounter (Signed)
Patient's wife states they just want to cancel the appointment 4/13, because they cannot get a ride and will call back when they can reschedule. ?

## 2021-06-28 NOTE — Telephone Encounter (Signed)
Patient's wife is returning call. 

## 2021-06-28 NOTE — Patient Instructions (Signed)
Bacterial Conjunctivitis, Adult °Bacterial conjunctivitis is an infection of the clear membrane that covers the white part of the eye and the inner surface of the eyelid (conjunctiva). When the blood vessels in the conjunctiva become inflamed, the eye becomes red or pink. The eye often feels irritated or itchy. Bacterial conjunctivitis spreads easily from person to person (is contagious). It also spreads easily from one eye to the other eye. °What are the causes? °This condition is caused by bacteria. You may get the infection if you come into close contact with: °A person who is infected with the bacteria. °Items that are contaminated with the bacteria, such as a face towel, contact lens solution, or eye makeup. °What increases the risk? °You are more likely to develop this condition if: °You are exposed to other people who have the infection. °You wear contact lenses. °You have a sinus infection. °You have had a recent eye injury or surgery. °You have a weak body defense system (immune system). °You have a medical condition that causes dry eyes. °What are the signs or symptoms? °Symptoms of this condition include: °Thick, yellowish discharge from the eye. This may turn into a crust on the eyelid overnight and cause your eyelids to stick together. °Tearing or watery eyes. °Itchy eyes. °Burning feeling in your eyes. °Eye redness. °Swollen eyelids. °Blurred vision. °How is this diagnosed? °This condition is diagnosed based on your symptoms and medical history. Your health care provider may also take a sample of discharge from your eye to find the cause of your infection. °How is this treated? °This condition may be treated with: °Antibiotic eye drops or ointment to clear the infection more quickly and prevent the spread of infection to others. °Antibiotic medicines taken by mouth (orally) to treat infections that do not respond to drops or ointments or that last longer than 10 days. °Cool, wet cloths (cool  compresses) placed on the eyes. °Artificial tears applied 2-6 times a day. °Follow these instructions at home: °Medicines °Take or apply your antibiotic medicine as told by your health care provider. Do not stop using the antibiotic, even if your condition improves, unless directed by your health care provider. °Take or apply over-the-counter and prescription medicines only as told by your health care provider. °Be very careful to avoid touching the edge of your eyelid with the eye-drop bottle or the ointment tube when you apply medicines to the affected eye. This will keep you from spreading the infection to your other eye or to other people. °Managing discomfort °Gently wipe away any drainage from your eye with a warm, wet washcloth or a cotton ball. °Apply a clean, cool compress to your eye for 10-20 minutes, 3-4 times a day. °General instructions °Do not wear contact lenses until the inflammation is gone and your health care provider says it is safe to wear them again. Ask your health care provider how to sterilize or replace your contact lenses before you use them again. Wear glasses until you can resume wearing contact lenses. °Avoid wearing eye makeup until the inflammation is gone. Throw away any old eye cosmetics that may be contaminated. °Change or wash your pillowcase every day. °Do not share towels or washcloths. This may spread the infection. °Wash your hands often with soap and water for at least 20 seconds and especially before touching your face or eyes. Use paper towels to dry your hands. °Avoid touching or rubbing your eyes. °Do not drive or use heavy machinery if your vision is blurred. °Contact   a health care provider if: °You have a fever. °Your symptoms do not get better after 10 days. °Get help right away if: °You have a fever and your symptoms suddenly get worse. °You have severe pain when you move your eye. °You have facial pain, redness, or swelling. °You have a sudden loss of  vision. °Summary °Bacterial conjunctivitis is an infection of the clear membrane that covers the white part of the eye and the inner surface of the eyelid (conjunctiva). °Bacterial conjunctivitis spreads easily from eye to eye and from person to person (is contagious). °Wash your hands often with soap and water for at least 20 seconds and especially before touching your face or eyes. Use paper towels to dry your hands. °Take or apply your antibiotic medicine as told by your health care provider. Do not stop using the antibiotic even if your condition improves. °Contact a health care provider if you have a fever or if your symptoms do not get better after 10 days. Get help right away if you have a sudden loss of vision. °This information is not intended to replace advice given to you by your health care provider. Make sure you discuss any questions you have with your health care provider. °Document Revised: 06/22/2020 Document Reviewed: 06/22/2020 °Elsevier Patient Education © 2022 [employer] ° °

## 2021-06-28 NOTE — Telephone Encounter (Signed)
Called and spoke with pt's wife. She state he had a virtual visit with his PCP and they said he has pink eye. They sent in eye drops. Pt will be contagious 3-4 days after he starts the drops. He needs to reschedule his cath. Will call to move pt's procedure.  ?

## 2021-06-28 NOTE — Telephone Encounter (Signed)
Spouse stated that patient will have a virtual visit with PCP today to get a diagnosis for the eye issue. She will call clinic to let us know. Cath lab advised. ?

## 2021-06-28 NOTE — Progress Notes (Signed)
? ?  Virtual Visit  Note ?Due to COVID-19 pandemic this visit was conducted virtually. This visit type was conducted due to national recommendations for restrictions regarding the COVID-19 Pandemic (e.g. social distancing, sheltering in place) in an effort to limit this patient's exposure and mitigate transmission in our community. All issues noted in this document were discussed and addressed.  A physical exam was not performed with this format. ? ?I connected with [name] on 06/28/21 at 1310 by telephone and verified that I am speaking with the correct person using two identifiers. [name] is currently located at [facility] and his wife is currently with him during the visit. The provider, Gabriel Earing, FNP is located in their office at time of visit. ? ?I discussed the limitations, risks, security and privacy concerns of performing an evaluation and management service by telephone and the availability of in person appointments. I also discussed with the patient that there may be a patient responsible charge related to this service. The patient expressed understanding and agreed to proceed. ? ?CC: pink eye ? ?History and Present Illness: ? ?HPI ?Unable to connect via video today given location. History was provided by [name] and his wife. [name] reports pink eye symptoms for 3-4 days. It start started with his left eye and now is also in his right eye. Reports that eyes look pink. They also itch and burn. Reports yellow drainage for both. He denies changes in his vision. ? ? ? ?ROS ?As per HPI.  ? ? ?Observations/Objective: ?Alert and oriented x 3. Able to speak in full sentences without difficulty.  ? ?Assessment and Plan: ?[name] was seen today for conjunctivitis. ? ?Diagnoses and all orders for this visit: ? ?Bacterial conjunctivitis of both eyes ?Drops as below. Discussed is contagious until drainage resolved. Frequent hand washing and avoid touching eyes to prevent transmission to others. Return to  office for new or worsening symptoms, or if symptoms persist.  ?-     trimethoprim-polymyxin b (POLYTRIM) ophthalmic solution; Place 1 drop into both eyes 4 (four) times daily for 7 days. ? ? ?Follow Up Instructions: ?As needed.  ? ?  ?I discussed the assessment and treatment plan with the patient. The patient was provided an opportunity to ask questions and all were answered. The patient agreed with the plan and demonstrated an understanding of the instructions. ?  ?The patient was advised to call back or seek an in-person evaluation if the symptoms worsen or if the condition fails to improve as anticipated. ? ?The above assessment and management plan was discussed with the patient. The patient verbalized understanding of and has agreed to the management plan. Patient is aware to call the clinic if symptoms persist or worsen. Patient is aware when to return to the clinic for a follow-up visit. Patient educated on when it is appropriate to go to the emergency department.  ? ?Time call ended:  1321 ? ?I provided 11 minutes of  non face-to-face time during this encounter. ? ? ? ?Gabriel Earing, FNP ? ? ?

## 2021-06-28 NOTE — Telephone Encounter (Signed)
?  Pt's wife said, pt has really bad cold and pink eye, both eyes has yellow discharge. She wanted to ask if pt needs to r/s his heart cath ?

## 2021-06-29 ENCOUNTER — Telehealth: Payer: Self-pay | Admitting: *Deleted

## 2021-06-29 NOTE — Telephone Encounter (Signed)
Cardiac Catheterization scheduled at [facility] for: Thursday July 06, 2021 10:30 AM ?Arrival time and place: [facility] Main Entrance A at: 5:30 AM-pre-procedure hydration ? ? ?No solid food after midnight prior to cath, clear liquids until 5 AM day of procedure. ? ?Medication instructions: ?-Hold: ? Olmesartan-day before and day of procedure-per protocol GFR 46 ? Insulin-AM of procedure/1/2 usual Insulin HS prior to procedure ? Farxiga-AM of procedure ?-Except hold medications usual morning medications can be taken with sips of water including aspirin 81 mg. ? ?Confirmed patient has responsible adult to drive home post procedure and be with patient first 24 hours after arriving home. ? ?Patient reports no new symptoms concerning for COVID-19/no exposure to COVID-19 in the past 10 days. ? ?Reviewed procedure instructions with patient's wife (DPR), Angie. ? ?

## 2021-06-29 NOTE — Telephone Encounter (Signed)
Patient's wife states the patient has found transportation and wants to schedule his cath for 4/13 again.  ?

## 2021-06-29 NOTE — Telephone Encounter (Signed)
Spoke with pt wife, cath has been rescheduled for 07/06/21 @10 :30 am. Patient is to arrive at 5:30 am for hydration. ?

## 2021-07-03 ENCOUNTER — Encounter: Payer: Self-pay | Admitting: Family Medicine

## 2021-07-03 ENCOUNTER — Ambulatory Visit (INDEPENDENT_AMBULATORY_CARE_PROVIDER_SITE_OTHER): Payer: Medicaid Other | Admitting: Family Medicine

## 2021-07-03 DIAGNOSIS — J301 Allergic rhinitis due to pollen: Secondary | ICD-10-CM | POA: Diagnosis not present

## 2021-07-03 MED ORDER — PREDNISONE 20 MG PO TABS: 20.0000 mg | ORAL_TABLET | Freq: Every day | ORAL | 0 refills | Status: AC

## 2021-07-03 MED ORDER — LEVOCETIRIZINE DIHYDROCHLORIDE 5 MG PO TABS: 5.0000 mg | ORAL_TABLET | Freq: Every evening | ORAL | 2 refills | Status: DC

## 2021-07-03 NOTE — Progress Notes (Signed)
? ?  Virtual Visit  Note ?Due to COVID-19 pandemic this visit was conducted virtually. This visit type was conducted due to national recommendations for restrictions regarding the COVID-19 Pandemic (e.g. social distancing, sheltering in place) in an effort to limit this patient's exposure and mitigate transmission in our community. All issues noted in this document were discussed and addressed.  A physical exam was not performed with this format. ? ?I connected with [name] on 07/03/21 at 1151 by telephone and verified that I am speaking with the correct person using two identifiers. [name] is currently located at home and his wife is currently with him during the visit. The provider, [name], FNP is located in their office at time of visit. ? ?I discussed the limitations, risks, security and privacy concerns of performing an evaluation and management service by telephone and the availability of in person appointments. I also discussed with the patient that there may be a patient responsible charge related to this service. The patient expressed understanding and agreed to proceed. ? ?CC: congestion ? ?History and Present Illness: ? ?HPI ?[name] reports cough and head congestion for 3 days. His ear have also been popping. He denies fever, chills, HA, facial pressure, shortness of breath, or chest pain. He has a history of seasonal allergies. He has not been taking zyrtec or flonase. Reports that nasal sprays make his nose bleed. Reports that symptoms have improvement some.  ? ? ? ?ROS ?As per HPI. ? ? ?Observations/Objective: ?Alert and oriented x 3. Able to speak in full sentences without difficulty.  ? ?Assessment and Plan: ?[name] was seen today for nasal congestion. ? ?Diagnoses and all orders for this visit: ? ?Seasonal allergic rhinitis due to pollen ?Xyzal as below. Short prednisone burst given as well. Mucinex prn for cough and congestion as well. Nasal saline as needed.  ?-      levocetirizine (XYZAL) 5 MG tablet; Take 1 tablet (5 mg total) by mouth every evening. ?-     predniSONE (DELTASONE) 20 MG tablet; Take 1 tablet (20 mg total) by mouth daily with breakfast for 3 days. ? ? ? ? ?Follow Up Instructions: ?Return to office for new or worsening symptoms, or if symptoms persist.  ? ?  ?I discussed the assessment and treatment plan with the patient. The patient was provided an opportunity to ask questions and all were answered. The patient agreed with the plan and demonstrated an understanding of the instructions. ?  ?The patient was advised to call back or seek an in-person evaluation if the symptoms worsen or if the condition fails to improve as anticipated. ? ?The above assessment and management plan was discussed with the patient. The patient verbalized understanding of and has agreed to the management plan. Patient is aware to call the clinic if symptoms persist or worsen. Patient is aware when to return to the clinic for a follow-up visit. Patient educated on when it is appropriate to go to the emergency department.  ? ?Time call ended:  1202 ? ?I provided 11 minutes of  non face-to-face time during this encounter. ? ? ? ?[name], FNP ? ? ?

## 2021-07-06 ENCOUNTER — Ambulatory Visit (HOSPITAL_COMMUNITY)
Admission: RE | Admit: 2021-07-06 | Discharge: 2021-07-06 | Disposition: A | Discharge status: Home / Self Care | Payer: Medicaid Other | Attending: Cardiology | Admitting: Cardiology

## 2021-07-06 ENCOUNTER — Ambulatory Visit (HOSPITAL_COMMUNITY): Admission: RE | Admit: 2021-07-06 | Payer: Medicaid Other | Source: Home / Self Care | Admitting: Cardiology

## 2021-07-06 ENCOUNTER — Encounter (HOSPITAL_COMMUNITY): Admission: RE | Payer: Medicaid Other | Source: Home / Self Care

## 2021-07-06 ENCOUNTER — Encounter (HOSPITAL_COMMUNITY)
Admission: RE | Disposition: A | Discharge status: Home / Self Care | Payer: Self-pay | Source: Home / Self Care | Attending: Cardiology

## 2021-07-06 ENCOUNTER — Other Ambulatory Visit: Payer: Self-pay

## 2021-07-06 DIAGNOSIS — Z7984 Long term (current) use of oral hypoglycemic drugs: Secondary | ICD-10-CM | POA: Diagnosis not present

## 2021-07-06 DIAGNOSIS — I25119 Atherosclerotic heart disease of native coronary artery with unspecified angina pectoris: Secondary | ICD-10-CM

## 2021-07-06 DIAGNOSIS — E785 Hyperlipidemia, unspecified: Secondary | ICD-10-CM | POA: Diagnosis not present

## 2021-07-06 DIAGNOSIS — Z79899 Other long term (current) drug therapy: Secondary | ICD-10-CM | POA: Insufficient documentation

## 2021-07-06 DIAGNOSIS — E1122 Type 2 diabetes mellitus with diabetic chronic kidney disease: Secondary | ICD-10-CM | POA: Diagnosis not present

## 2021-07-06 DIAGNOSIS — E1169 Type 2 diabetes mellitus with other specified complication: Secondary | ICD-10-CM | POA: Diagnosis present

## 2021-07-06 DIAGNOSIS — Z955 Presence of coronary angioplasty implant and graft: Secondary | ICD-10-CM | POA: Diagnosis not present

## 2021-07-06 DIAGNOSIS — N183 Chronic kidney disease, stage 3 unspecified: Secondary | ICD-10-CM | POA: Insufficient documentation

## 2021-07-06 DIAGNOSIS — E1159 Type 2 diabetes mellitus with other circulatory complications: Secondary | ICD-10-CM | POA: Diagnosis present

## 2021-07-06 DIAGNOSIS — I129 Hypertensive chronic kidney disease with stage 1 through stage 4 chronic kidney disease, or unspecified chronic kidney disease: Secondary | ICD-10-CM | POA: Diagnosis not present

## 2021-07-06 DIAGNOSIS — I25110 Atherosclerotic heart disease of native coronary artery with unstable angina pectoris: Secondary | ICD-10-CM | POA: Diagnosis present

## 2021-07-06 DIAGNOSIS — I255 Ischemic cardiomyopathy: Secondary | ICD-10-CM | POA: Insufficient documentation

## 2021-07-06 DIAGNOSIS — Z794 Long term (current) use of insulin: Secondary | ICD-10-CM | POA: Diagnosis not present

## 2021-07-06 DIAGNOSIS — I2510 Atherosclerotic heart disease of native coronary artery without angina pectoris: Secondary | ICD-10-CM

## 2021-07-06 HISTORY — PX: LEFT HEART CATH AND CORONARY ANGIOGRAPHY: CATH118249

## 2021-07-06 LAB — GLUCOSE, CAPILLARY
Glucose-Capillary: 129 mg/dL — ABNORMAL HIGH (ref 70–99)
Glucose-Capillary: 103 mg/dL — ABNORMAL HIGH (ref 70–99)

## 2021-07-06 SURGERY — LEFT HEART CATH AND CORONARY ANGIOGRAPHY
Anesthesia: LOCAL

## 2021-07-06 MED ORDER — CARVEDILOL 3.125 MG PO TABS
6.2500 mg | ORAL_TABLET | Freq: Once | ORAL | Status: AC
  Administered 2021-07-06: 6.25 mg via ORAL
  Filled 2021-07-06: qty 2

## 2021-07-06 MED ORDER — SODIUM CHLORIDE 0.9 % IV SOLN
250.0000 mL | INTRAVENOUS | Status: DC | PRN
Start: 2021-07-06 — End: 2021-07-06

## 2021-07-06 MED ORDER — ACETAMINOPHEN 325 MG PO TABS: 650.0000 mg | ORAL_TABLET | ORAL | Status: DC | PRN

## 2021-07-06 MED ORDER — ISOSORBIDE MONONITRATE ER 60 MG PO TB24
60.0000 mg | ORAL_TABLET | ORAL | Status: AC
  Administered 2021-07-06: 60 mg via ORAL
  Filled 2021-07-06 (×2): qty 1

## 2021-07-06 MED ORDER — MIDAZOLAM HCL 2 MG/2ML IJ SOLN
INTRAMUSCULAR | Status: DC | PRN
  Administered 2021-07-06: 1 mg via INTRAVENOUS

## 2021-07-06 MED ORDER — VERAPAMIL HCL 2.5 MG/ML IV SOLN
INTRAVENOUS | Status: AC
Start: 2021-07-06 — End: ?
  Filled 2021-07-06: qty 2

## 2021-07-06 MED ORDER — SODIUM CHLORIDE 0.9% FLUSH: 3.0000 mL | Freq: Two times a day (BID) | INTRAVENOUS | Status: DC

## 2021-07-06 MED ORDER — SODIUM CHLORIDE 0.9 % WEIGHT BASED INFUSION
3.0000 mL/kg/h | INTRAVENOUS | Status: AC
  Administered 2021-07-06: 3 mL/kg/h via INTRAVENOUS

## 2021-07-06 MED ORDER — HEPARIN SODIUM (PORCINE) 1000 UNIT/ML IJ SOLN
INTRAMUSCULAR | Status: DC | PRN
  Administered 2021-07-06: 5000 [IU] via INTRAVENOUS

## 2021-07-06 MED ORDER — SODIUM CHLORIDE 0.9% FLUSH: 3.0000 mL | INTRAVENOUS | Status: DC | PRN

## 2021-07-06 MED ORDER — SODIUM CHLORIDE 0.9 % WEIGHT BASED INFUSION: 1.0000 mL/kg/h | INTRAVENOUS | Status: DC

## 2021-07-06 MED ORDER — MIDAZOLAM HCL 2 MG/2ML IJ SOLN
INTRAMUSCULAR | Status: AC
  Filled 2021-07-06: qty 2

## 2021-07-06 MED ORDER — ONDANSETRON HCL 4 MG/2ML IJ SOLN: 4.0000 mg | Freq: Four times a day (QID) | INTRAMUSCULAR | Status: DC | PRN

## 2021-07-06 MED ORDER — SODIUM CHLORIDE 0.9 % IV SOLN: 250.0000 mL | INTRAVENOUS | Status: DC | PRN

## 2021-07-06 MED ORDER — LIDOCAINE HCL (PF) 1 % IJ SOLN
INTRAMUSCULAR | Status: DC | PRN
  Administered 2021-07-06: 2 mL

## 2021-07-06 MED ORDER — FENTANYL CITRATE (PF) 100 MCG/2ML IJ SOLN
INTRAMUSCULAR | Status: DC | PRN
  Administered 2021-07-06: 25 ug via INTRAVENOUS

## 2021-07-06 MED ORDER — ASPIRIN 81 MG PO CHEW
81.0000 mg | CHEWABLE_TABLET | ORAL | Status: AC
  Administered 2021-07-06: 81 mg via ORAL
  Filled 2021-07-06: qty 1

## 2021-07-06 MED ORDER — HEPARIN (PORCINE) IN NACL 1000-0.9 UT/500ML-% IV SOLN
INTRAVENOUS | Status: AC
  Filled 2021-07-06: qty 1000

## 2021-07-06 MED ORDER — VERAPAMIL HCL 2.5 MG/ML IV SOLN
INTRAVENOUS | Status: DC | PRN
  Administered 2021-07-06: 10 mL via INTRA_ARTERIAL

## 2021-07-06 MED ORDER — IOHEXOL 350 MG/ML SOLN
INTRAVENOUS | Status: DC | PRN
  Administered 2021-07-06: 60 mL

## 2021-07-06 MED ORDER — FENTANYL CITRATE (PF) 100 MCG/2ML IJ SOLN
INTRAMUSCULAR | Status: AC
  Filled 2021-07-06: qty 2

## 2021-07-06 MED ORDER — LIDOCAINE HCL (PF) 1 % IJ SOLN
INTRAMUSCULAR | Status: AC
  Filled 2021-07-06: qty 30

## 2021-07-06 MED ORDER — HEPARIN SODIUM (PORCINE) 1000 UNIT/ML IJ SOLN
INTRAMUSCULAR | Status: AC
  Filled 2021-07-06: qty 10

## 2021-07-06 MED ORDER — HEPARIN (PORCINE) IN NACL 1000-0.9 UT/500ML-% IV SOLN
INTRAVENOUS | Status: DC | PRN
  Administered 2021-07-06 (×2): 500 mL

## 2021-07-06 SURGICAL SUPPLY — 13 items
BAND CMPR LRG ZPHR (HEMOSTASIS) ×1
BAND ZEPHYR COMPRESS 30 LONG (HEMOSTASIS) ×1 IMPLANT
CATH 5FR JL3.5 JR4 ANG PIG MP (CATHETERS) ×1 IMPLANT
CATH LAUNCHER 5F JL3 (CATHETERS) IMPLANT
CATHETER LAUNCHER 5F JL3 (CATHETERS) ×2
GLIDESHEATH SLEND SS 6F .021 (SHEATH) ×1 IMPLANT
GUIDEWIRE INQWIRE 1.5J.035X260 (WIRE) IMPLANT
INQWIRE 1.5J .035X260CM (WIRE) ×2
KIT HEART LEFT (KITS) ×2 IMPLANT
PACK CARDIAC CATHETERIZATION (CUSTOM PROCEDURE TRAY) ×2 IMPLANT
TRANSDUCER W/STOPCOCK (MISCELLANEOUS) ×2 IMPLANT
TUBING CIL FLEX 10 FLL-RA (TUBING) ×2 IMPLANT
WIRE HI TORQ VERSACORE-J 145CM (WIRE) ×1 IMPLANT

## 2021-07-06 NOTE — Interval H&P Note (Signed)
History and Physical Interval Note: ? ?07/06/2021 ?9:50 AM ? ?[name]  has presented today for surgery, with the diagnosis of unstable angina.  The various methods of treatment have been discussed with the patient and family. After consideration of risks, benefits and other options for treatment, the patient has consented to  Procedure(s): ?LEFT HEART CATH AND CORONARY ANGIOGRAPHY (N/A) as a surgical intervention.  The patient's history has been reviewed, patient examined, no change in status, stable for surgery.  I have reviewed the patient's chart and labs.  Questions were answered to the patient's satisfaction.   ? ?Cath Lab Visit (complete for each Cath Lab visit) ? ?Clinical Evaluation Leading to the Procedure:  ? ?ACS: No. ? ?Non-ACS:   ? ?Anginal Classification: CCS III ? ?Anti-ischemic medical therapy: Maximal Therapy (2 or more classes of medications) ? ?Non-Invasive Test Results: No non-invasive testing performed ? ?Prior CABG: No previous CABG ? ? ? ? ? ? ?Theron Arista [facility] ?07/06/2021 ?9:50 AM ? ? ? ?

## 2021-07-07 ENCOUNTER — Encounter (HOSPITAL_COMMUNITY): Payer: Self-pay | Admitting: Cardiology

## 2021-07-27 NOTE — Progress Notes (Signed)
?  ?Cardiology Office Note ? ? ?Date:  07/28/2021  ? ?ID:  [name], DOB [birth year], MRN [id number] ? ?PCP:  [name], FNP  ?Cardiologist:   [name], MD ? ? ?Chief Complaint  ?Patient presents with  ? Cardiomyopathy  ? ? ? ?  ?History of Present Illness: ?[name] is a 51 y.o. male who presents with a hx of coronary disease.  He had an LAD BMS in 2010.  He had in-stent restenosis in 2018 treated with PCI.  Catheterization in January 2021 showed a patent LAD stent with an 80% distal LAD.  He had a 70% proximal RCA and an 85% mid RCA, the RCA was treated with PCI and DES.  The patient also has several other medical issues.  He has insulin-dependent diabetes.  He has treated dyslipidemia.  He has chronic renal insufficiency 3 with a GFR 44.  There is a history of seizures.   His last ejection fraction was 45% by echocardiogram May 2021.  In the past he has had problems with hyperkalemia.  He was on Entresto at one point and this was stopped and he was restarted on Losartan.  He was having increased chest pain.  I sent him for a cath and he had disease as below. He was managed medically.   Of note his ejection fraction was said to be 25%.  His end-diastolic pressure was low. ? ?Since he was seen his breathing still gets somewhat short.  Not describing PND or orthopnea.  He has had no new weight gain or swelling.  He thinks his chest discomfort is less problematic.  He is not having any presyncope or syncope. ? ?Past Medical History:  ?Diagnosis Date  ? Alcohol abuse   ? CAD (coronary artery disease), native coronary artery   ? a. 01/2009 s/p Anterior MI and thrombectomy with BMS to mid LAD;  b. 06/2016 Cath/PCI: LM nl, LAD 33m/d, LCX nl, OM1/2/3 nl, RDA 21m. Initial attempt @ PCI failed followed by successful CTO LAD PCI and DES (2.5x38 Synergy)-->rec for indefinte DAPT.  ? CKD (chronic kidney disease), stage III ([place])   ? Essential hypertension   ? History of nonadherence to medical treatment   ?  Hyperlipidemia   ? Ischemic cardiomyopathy   ? a. EF 40% by nuc in 2012;  b. 06/2016 Echo: EF 40-45%, apicalanteroseptal and apical AK, Gr2 DD, mod Ca2+ Ao annulus, mild MR.  ? Slurred speech - Chronic   ? Type 2 diabetes mellitus ([place])   ? ? ?Past Surgical History:  ?Procedure Laterality Date  ? CORONARY CTO INTERVENTION N/A 07/11/2016  ? Procedure: Coronary CTO Intervention;  Surgeon: Peter M Martinique, MD;  Location: [place] [facility];  Service: Cardiovascular;  Laterality: N/A;  ? CORONARY STENT INTERVENTION N/A 07/09/2016  ? Procedure: Coronary Stent Intervention;  Surgeon: Lorretta Harp, MD;  Location: [place] [facility];  Service: Cardiovascular;  Laterality: N/A;  ? CORONARY STENT INTERVENTION N/A 04/24/2019  ? Procedure: CORONARY STENT INTERVENTION;  Surgeon: Leonie Man, MD;  Location: [place] [facility];  Service: Cardiovascular;  Laterality: N/A;  ? Knee arthroscopic surgery Right   ? LEFT HEART CATH AND CORONARY ANGIOGRAPHY N/A 07/05/2016  ? Procedure: Left Heart Cath and Coronary Angiography;  Surgeon: Belva Crome, MD;  Location: [place] [facility];  Service: Cardiovascular;  Laterality: N/A;  ? LEFT HEART CATH AND CORONARY ANGIOGRAPHY N/A 04/24/2019  ? Procedure: LEFT HEART CATH AND CORONARY ANGIOGRAPHY;  Surgeon: Ellyn Hack,  Leonie Green, MD;  Location: [place] [facility];  Service: Cardiovascular;  Laterality: N/A;  ? LEFT HEART CATH AND CORONARY ANGIOGRAPHY N/A 07/06/2021  ? Procedure: LEFT HEART CATH AND CORONARY ANGIOGRAPHY;  Surgeon: Martinique, Peter M, MD;  Location: [place] [facility];  Service: Cardiovascular;  Laterality: N/A;  ? ? ? ?Current Outpatient Medications  ?Medication Sig Dispense Refill  ? Accu-Chek Softclix Lancets lancets Test BS QID Dx E11.9 400 each 3  ? aspirin 81 MG chewable tablet Chew 1 tablet (81 mg total) by mouth daily with breakfast. 120 tablet 2  ? atorvastatin (LIPITOR) 80 MG tablet Take 1 tablet (80 mg total) by mouth at bedtime. 90 tablet 3  ? carvedilol (COREG) 12.5 MG  tablet Take 1 tablet (12.5 mg total) by mouth 2 (two) times daily. 180 tablet 3  ? citalopram (CELEXA) 20 MG tablet Take 1 tablet (20 mg total) by mouth daily. 90 tablet 3  ? Continuous Blood Gluc Receiver (FREESTYLE LIBRE 2 READER) DEVI 1 each by Does not apply route daily. 1 each 0  ? Continuous Blood Gluc Sensor (FREESTYLE LIBRE 2 SENSOR) MISC 1 each by Does not apply route every 14 (fourteen) days. 2 each 2  ? dapagliflozin propanediol (FARXIGA) 10 MG TABS tablet Take 1 tablet (10 mg total) by mouth daily before breakfast. 90 tablet 1  ? gabapentin (NEURONTIN) 300 MG capsule Take 1 capsule (300 mg total) by mouth 3 (three) times daily. 270 capsule 1  ? glucose blood (ACCU-CHEK GUIDE) test strip Test BS QID Dx E11.9 400 each 3  ? insulin aspart (NOVOLOG) 100 UNIT/ML FlexPen Inject 0-10 Units into the skin See admin instructions. insulin aspart (novoLOG) injection 0-10 Units 0-10 Units Subcutaneous, 3 times daily with meals CBG < 70: Implement Hypoglycemia Standing Orders and refer to Hypoglycemia Standing Orders sidebar report  CBG 70 - 120: 0 unit CBG 121 - 150: 0 unit  CBG 151 - 200: 1 unit CBG 201 - 250: 2 units CBG 251 - 300: 4 units CBG 301 - 350: 6 units  CBG 351 - 400: 8 units  CBG > 400: 10 units 15 mL 11  ? insulin glargine (LANTUS) 100 UNIT/ML injection Inject 0.18 mLs (18 Units total) into the skin at bedtime. (Patient taking differently: Inject 50 Units into the skin 2 (two) times daily.) 10 mL 4  ? Insulin Pen Needle (PEN NEEDLES) 32G X 5 MM MISC Use with Novolog Flexpen as directed 100 each 3  ? Insulin Syringe-Needle U-100 (TRUEPLUS INSULIN SYRINGE) 31G X 5/16" 1 ML MISC Use to inject insulin once daily Dx E11.8 100 each 3  ? isosorbide mononitrate (IMDUR) 60 MG 24 hr tablet Take 1 tablet (60 mg total) by mouth daily. 30 tablet 3  ? levocetirizine (XYZAL) 5 MG tablet Take 1 tablet (5 mg total) by mouth every evening. 30 tablet 2  ? nitroGLYCERIN (NITROSTAT) 0.4 MG SL tablet Place 1 tablet (0.4 mg  total) under the tongue every 5 (five) minutes as needed for chest pain. 25 tablet 4  ? olmesartan (BENICAR) 20 MG tablet Take 1 tablet (20 mg total) by mouth daily. 90 tablet 1  ? Oxcarbazepine (TRILEPTAL) 300 MG tablet Take 1/2 tablet twice a day for 1 week, then increase to 1 tablet twice a day for 1 week, then increase to 2 tablets twice a day and continue 120 tablet 5  ? pantoprazole (PROTONIX) 40 MG tablet Take 1 tablet (40 mg total) by mouth daily. 90 tablet 1  ?  sodium bicarbonate 650 MG tablet Take 650 mg by mouth 2 (two) times daily.    ? ?No current facility-administered medications for this visit.  ? ? ?Allergies:   Penicillins  ? ? ?ROS:  Please see the history of present illness.   Otherwise, review of systems are positive for none.   All other systems are reviewed and negative.  ? ? ?PHYSICAL EXAM: ?VS:  BP (!) 148/100 (BP Location: Right Arm, Patient Position: Sitting, Cuff Size: Normal)   Pulse 90   Ht 5\' 6"  (1.676 m)   Wt 151 lb (68.5 kg)   BMI 24.37 kg/m?  , BMI Body mass index is 24.37 kg/m?. ?GENERAL:  Well appearing ?NECK:  No jugular venous distention, waveform within normal limits, carotid upstroke brisk and symmetric, no bruits, no thyromegaly ?LUNGS:  Clear to auscultation bilaterally ?CHEST:  Unremarkable ?HEART:  PMI not displaced or sustained,S1 and S2 within normal limits, no S3, no S4, no clicks, no rubs, no murmurs ?ABD:  Flat, positive bowel sounds normal in frequency in pitch, no bruits, no rebound, no guarding, no midline pulsatile mass, no hepatomegaly, no splenomegaly ?EXT:  2 plus pulses throughout, no edema, no cyanosis no clubbing ? ? ?EKG:  EKG is  not ordered today. ? ? ? ?Recent Labs: ?05/05/2021: ALT 12 ?06/21/2021: BUN 13; Creatinine, Ser 1.79; Hemoglobin 15.1; Platelets 290; Potassium 5.2; Sodium 141  ? ? ?Lipid Panel ?   ?Component Value Date/Time  ? CHOL 166 05/05/2021 1607  ? TRIG 161 (H) 05/05/2021 1607  ? HDL 46 05/05/2021 1607  ? CHOLHDL 3.6 05/05/2021 1607  ?  CHOLHDL 2.1 07/06/2016 0534  ? VLDL 19 07/06/2016 0534  ? [place] 92 05/05/2021 1607  ? ?  ? ?Wt Readings from Last 3 Encounters:  ?07/28/21 151 lb (68.5 kg)  ?07/06/21 146 lb (66.2 kg)  ?06/21/21 148 lb (67.1 kg)

## 2021-07-28 ENCOUNTER — Ambulatory Visit: Payer: Medicaid Other | Admitting: Cardiology

## 2021-07-28 ENCOUNTER — Encounter: Payer: Self-pay | Admitting: Cardiology

## 2021-07-28 VITALS — BP 148/100 | HR 90 | Ht 66.0 in | Wt 151.0 lb

## 2021-07-28 DIAGNOSIS — I255 Ischemic cardiomyopathy: Secondary | ICD-10-CM

## 2021-07-28 DIAGNOSIS — I10 Essential (primary) hypertension: Secondary | ICD-10-CM | POA: Diagnosis not present

## 2021-07-28 DIAGNOSIS — Z794 Long term (current) use of insulin: Secondary | ICD-10-CM

## 2021-07-28 DIAGNOSIS — Z9861 Coronary angioplasty status: Secondary | ICD-10-CM | POA: Diagnosis not present

## 2021-07-28 DIAGNOSIS — I2510 Atherosclerotic heart disease of native coronary artery without angina pectoris: Secondary | ICD-10-CM | POA: Diagnosis not present

## 2021-07-28 DIAGNOSIS — E118 Type 2 diabetes mellitus with unspecified complications: Secondary | ICD-10-CM | POA: Diagnosis not present

## 2021-07-28 MED ORDER — CARVEDILOL 12.5 MG PO TABS: 12.5000 mg | ORAL_TABLET | Freq: Two times a day (BID) | ORAL | 3 refills | Status: DC

## 2021-07-28 NOTE — Patient Instructions (Signed)
Medication Instructions:  ?Increase Carvedilol to 12.5 mg twice a day ?Continue all other medications ?*If you need a refill on your cardiac medications before your next appointment, please call your pharmacy* ? ? ?Lab Work: ?None ordered ? ? ?Testing/Procedures: ?Echo ? ? ?Follow-Up: ?At [facility], you and your health needs are our priority.  As part of our continuing mission to provide you with exceptional heart care, we have created designated Provider Care Teams.  These Care Teams include your primary Cardiologist (physician) and Advanced Practice Providers (APPs -  Physician Assistants and Nurse Practitioners) who all work together to provide you with the care you need, when you need it. ? ?We recommend signing up for the patient portal called "MyChart".  Sign up information is provided on this After Visit Summary.  MyChart is used to connect with patients for Virtual Visits (Telemedicine).  Patients are able to view lab/test results, encounter notes, upcoming appointments, etc.  Non-urgent messages can be sent to your provider as well.   ?To learn more about what you can do with MyChart, go to [email or web address].   ? ?Your next appointment:  After test ?  ? ?The format for your next appointment: Office  ? ? ?Provider:  Dr.Hochrein ? ? ?Important Information About Sugar ? ? ? ? ? ? ?

## 2021-08-02 DIAGNOSIS — L02212 Cutaneous abscess of back [any part, except buttock]: Secondary | ICD-10-CM | POA: Diagnosis not present

## 2021-08-02 DIAGNOSIS — L219 Seborrheic dermatitis, unspecified: Secondary | ICD-10-CM | POA: Diagnosis not present

## 2021-08-02 DIAGNOSIS — D485 Neoplasm of uncertain behavior of skin: Secondary | ICD-10-CM | POA: Diagnosis not present

## 2021-08-08 ENCOUNTER — Ambulatory Visit (HOSPITAL_COMMUNITY)
Admission: RE | Admit: 2021-08-08 | Discharge: 2021-08-08 | Disposition: A | Discharge status: Home / Self Care | Payer: Medicaid Other | Source: Ambulatory Visit | Attending: Cardiology | Admitting: Cardiology

## 2021-08-08 DIAGNOSIS — E118 Type 2 diabetes mellitus with unspecified complications: Secondary | ICD-10-CM | POA: Diagnosis not present

## 2021-08-08 DIAGNOSIS — I10 Essential (primary) hypertension: Secondary | ICD-10-CM | POA: Diagnosis not present

## 2021-08-08 DIAGNOSIS — Z794 Long term (current) use of insulin: Secondary | ICD-10-CM | POA: Diagnosis not present

## 2021-08-08 DIAGNOSIS — Z9861 Coronary angioplasty status: Secondary | ICD-10-CM | POA: Diagnosis not present

## 2021-08-08 DIAGNOSIS — I255 Ischemic cardiomyopathy: Secondary | ICD-10-CM | POA: Insufficient documentation

## 2021-08-08 DIAGNOSIS — I2510 Atherosclerotic heart disease of native coronary artery without angina pectoris: Secondary | ICD-10-CM | POA: Insufficient documentation

## 2021-08-08 LAB — ECHOCARDIOGRAM COMPLETE
AR max vel: 2.48 cm2
AV Area VTI: 2.91 cm2
AV Area mean vel: 2.5 cm2
AV Mean grad: 1.7 mmHg
AV Peak grad: 3.2 mmHg
Ao pk vel: 0.9 m/s
Area-P 1/2: 3.12 cm2
S' Lateral: 3.4 cm

## 2021-08-08 NOTE — Progress Notes (Signed)
*  PRELIMINARY RESULTS* ?Echocardiogram ?2D Echocardiogram has been performed. ? ?[name] ?08/08/2021, 2:28 PM ?

## 2021-08-14 ENCOUNTER — Encounter: Payer: Self-pay | Admitting: *Deleted

## 2021-08-16 ENCOUNTER — Ambulatory Visit: Payer: Medicaid Other | Admitting: Cardiology

## 2021-08-23 ENCOUNTER — Ambulatory Visit: Payer: Medicaid Other | Admitting: Family Medicine

## 2021-08-29 ENCOUNTER — Telehealth: Payer: Self-pay | Admitting: Family Medicine

## 2021-08-29 NOTE — Telephone Encounter (Signed)
Pt is requesting that we send his medical history and health conditions to email address  [email or web address]

## 2021-08-29 NOTE — Telephone Encounter (Signed)
Please advise If this is okay to do?

## 2021-08-30 ENCOUNTER — Ambulatory Visit: Payer: Medicaid Other | Admitting: Cardiology

## 2021-08-30 NOTE — Telephone Encounter (Signed)
Pt aware he must come in to sign release of records first.

## 2021-08-30 NOTE — Telephone Encounter (Signed)
If patient is ok with this, that is fine.

## 2021-10-30 NOTE — Progress Notes (Deleted)
Cardiology Office Note   Date:  10/30/2021   ID:  Kevin HazelKevin Lampron, DOB 03/11/1971, MRN [id number]  PCP:  Gabriel EaringMorgan, Tiffany M, FNP  Cardiologist:   Rollene RotundaJames Akeia Perot, MD   No chief complaint on file.      History of Present Illness: [name] is a 51 y.o. male who presents with a hx of coronary disease.  He had an LAD BMS in 2010.  He had in-stent restenosis in 2018 treated with PCI.  Catheterization in January 2021 showed a patent LAD stent with an 80% distal LAD.  He had a 70% proximal RCA and an 85% mid RCA, the RCA was treated with PCI and DES.  The patient also has several other medical issues.  He has insulin-dependent diabetes.  He has treated dyslipidemia.  He has chronic renal insufficiency 3 with a GFR 44.  There is a history of seizures.   His last ejection fraction was 45% by echocardiogram May 2021.  In the past he has had problems with hyperkalemia.  He was on Entresto at one point and this was stopped and he was restarted on Losartan.  He was having increased chest pain.  I sent him for a cath and he had disease as below. He was managed medically.   Of note his ejection fraction was said to be 25%.  His end-diastolic pressure was low.  ***    ***  Since he was seen his breathing still gets somewhat short.  Not describing PND or orthopnea.  He has had no new weight gain or swelling.  He thinks his chest discomfort is less problematic.  He is not having any presyncope or syncope.  Past Medical History:  Diagnosis Date   Alcohol abuse    CAD (coronary artery disease), native coronary artery    a. 01/2009 s/p Anterior MI and thrombectomy with BMS to mid LAD;  b. 06/2016 Cath/PCI: LM nl, LAD 3248m/d, LCX nl, OM1/2/3 nl, RDA 1166m. Initial attempt @ PCI failed followed by successful CTO LAD PCI and DES (2.5x38 Synergy)-->rec for indefinte DAPT.   CKD (chronic kidney disease), stage III (HCC)    Essential hypertension    History of nonadherence to medical treatment    Hyperlipidemia     Ischemic cardiomyopathy    a. EF 40% by nuc in 2012;  b. 06/2016 Echo: EF 40-45%, apicalanteroseptal and apical AK, Gr2 DD, mod Ca2+ Ao annulus, mild MR.   Slurred speech - Chronic    Type 2 diabetes mellitus (HCC)     Past Surgical History:  Procedure Laterality Date   CORONARY CTO INTERVENTION N/A 07/11/2016   Procedure: Coronary CTO Intervention;  Surgeon: Peter M SwazilandJordan, MD;  [facility];  Service: Cardiovascular;  Laterality: N/A;   CORONARY STENT INTERVENTION N/A 07/09/2016   Procedure: Coronary Stent Intervention;  Surgeon: Runell GessJonathan J Berry, MD;  Location: [facility] INVASIVE CV LAB;  Service: Cardiovascular;  Laterality: N/A;   CORONARY STENT INTERVENTION N/A 04/24/2019   Procedure: CORONARY STENT INTERVENTION;  Surgeon: Marykay LexHarding, David W, MD;  [facility];  Service: Cardiovascular;  Laterality: N/A;   Knee arthroscopic surgery Right    LEFT HEART CATH AND CORONARY ANGIOGRAPHY N/A 07/05/2016   Procedure: Left Heart Cath and Coronary Angiography;  Surgeon: Lyn RecordsHenry W Smith, MD;  [facility];  Service: Cardiovascular;  Laterality: N/A;   LEFT HEART CATH AND CORONARY ANGIOGRAPHY N/A 04/24/2019   Procedure: LEFT HEART CATH AND CORONARY ANGIOGRAPHY;  Surgeon:  Marykay Lex, MD;  Location: [facility] INVASIVE CV LAB;  Service: Cardiovascular;  Laterality: N/A;   LEFT HEART CATH AND CORONARY ANGIOGRAPHY N/A 07/06/2021   Procedure: LEFT HEART CATH AND CORONARY ANGIOGRAPHY;  Surgeon: Swaziland, Peter M, MD;  Location: [facility] INVASIVE CV LAB;  Service: Cardiovascular;  Laterality: N/A;     Current Outpatient Medications  Medication Sig Dispense Refill   Accu-Chek Softclix Lancets lancets Test BS QID Dx E11.9 400 each 3   aspirin 81 MG chewable tablet Chew 1 tablet (81 mg total) by mouth daily with breakfast. 120 tablet 2   atorvastatin (LIPITOR) 80 MG tablet Take 1 tablet (80 mg total) by mouth at bedtime. 90 tablet 3   carvedilol (COREG) 12.5 MG tablet Take 1 tablet  (12.5 mg total) by mouth 2 (two) times daily. 180 tablet 3   citalopram (CELEXA) 20 MG tablet Take 1 tablet (20 mg total) by mouth daily. 90 tablet 3   Continuous Blood Gluc Receiver (FREESTYLE LIBRE 2 READER) DEVI 1 each by Does not apply route daily. 1 each 0   Continuous Blood Gluc Sensor (FREESTYLE LIBRE 2 SENSOR) MISC 1 each by Does not apply route every 14 (fourteen) days. 2 each 2   dapagliflozin propanediol (FARXIGA) 10 MG TABS tablet Take 1 tablet (10 mg total) by mouth daily before breakfast. 90 tablet 1   gabapentin (NEURONTIN) 300 MG capsule Take 1 capsule (300 mg total) by mouth 3 (three) times daily. 270 capsule 1   glucose blood (ACCU-CHEK GUIDE) test strip Test BS QID Dx E11.9 400 each 3   insulin aspart (NOVOLOG) 100 UNIT/ML FlexPen Inject 0-10 Units into the skin See admin instructions. insulin aspart (novoLOG) injection 0-10 Units 0-10 Units Subcutaneous, 3 times daily with meals CBG < 70: Implement Hypoglycemia Standing Orders and refer to Hypoglycemia Standing Orders sidebar report  CBG 70 - 120: 0 unit CBG 121 - 150: 0 unit  CBG 151 - 200: 1 unit CBG 201 - 250: 2 units CBG 251 - 300: 4 units CBG 301 - 350: 6 units  CBG 351 - 400: 8 units  CBG > 400: 10 units 15 mL 11   insulin glargine (LANTUS) 100 UNIT/ML injection Inject 0.18 mLs (18 Units total) into the skin at bedtime. (Patient taking differently: Inject 50 Units into the skin 2 (two) times daily.) 10 mL 4   Insulin Pen Needle (PEN NEEDLES) 32G X 5 MM MISC Use with Novolog Flexpen as directed 100 each 3   Insulin Syringe-Needle U-100 (TRUEPLUS INSULIN SYRINGE) 31G X 5/16" 1 ML MISC Use to inject insulin once daily Dx E11.8 100 each 3   isosorbide mononitrate (IMDUR) 60 MG 24 hr tablet Take 1 tablet (60 mg total) by mouth daily. 30 tablet 3   levocetirizine (XYZAL) 5 MG tablet Take 1 tablet (5 mg total) by mouth every evening. 30 tablet 2   nitroGLYCERIN (NITROSTAT) 0.4 MG SL tablet Place 1 tablet (0.4 mg total) under the  tongue every 5 (five) minutes as needed for chest pain. 25 tablet 4   olmesartan (BENICAR) 20 MG tablet Take 1 tablet (20 mg total) by mouth daily. 90 tablet 1   Oxcarbazepine (TRILEPTAL) 300 MG tablet Take 1/2 tablet twice a day for 1 week, then increase to 1 tablet twice a day for 1 week, then increase to 2 tablets twice a day and continue 120 tablet 5   pantoprazole (PROTONIX) 40 MG tablet Take 1 tablet (40 mg total) by mouth daily. 90 tablet  1   sodium bicarbonate 650 MG tablet Take 650 mg by mouth 2 (two) times daily.     No current facility-administered medications for this visit.    Allergies:   Penicillins    ROS:  Please see the history of present illness.   Otherwise, review of systems are positive for ***.   All other systems are reviewed and negative.    PHYSICAL EXAM: VS:  There were no vitals taken for this visit. , BMI There is no height or weight on file to calculate BMI. GENERAL:  Well appearing NECK:  No jugular venous distention, waveform within normal limits, carotid upstroke brisk and symmetric, no bruits, no thyromegaly LUNGS:  Clear to auscultation bilaterally CHEST:  Unremarkable HEART:  PMI not displaced or sustained,S1 and S2 within normal limits, no S3, no S4, no clicks, no rubs, *** murmurs ABD:  Flat, positive bowel sounds normal in frequency in pitch, no bruits, no rebound, no guarding, no midline pulsatile mass, no hepatomegaly, no splenomegaly EXT:  2 plus pulses throughout, no edema, no cyanosis no clubbing     ***GENERAL:  Well appearing NECK:  No jugular venous distention, waveform within normal limits, carotid upstroke brisk and symmetric, no bruits, no thyromegaly LUNGS:  Clear to auscultation bilaterally CHEST:  Unremarkable HEART:  PMI not displaced or sustained,S1 and S2 within normal limits, no S3, no S4, no clicks, no rubs, no murmurs ABD:  Flat, positive bowel sounds normal in frequency in pitch, no bruits, no rebound, no guarding, no  midline pulsatile mass, no hepatomegaly, no splenomegaly EXT:  2 plus pulses throughout, no edema, no cyanosis no clubbing   EKG:  EKG is *** ordered today.    Recent Labs: 05/05/2021: ALT 12 06/21/2021: BUN 13; Creatinine, Ser 1.79; Hemoglobin 15.1; Platelets 290; Potassium 5.2; Sodium 141    Lipid Panel    Component Value Date/Time   CHOL 166 05/05/2021 1607   TRIG 161 (H) 05/05/2021 1607   HDL 46 05/05/2021 1607   CHOLHDL 3.6 05/05/2021 1607   CHOLHDL 2.1 07/06/2016 0534   VLDL 19 07/06/2016 0534   LDLCALC 92 05/05/2021 1607      Wt Readings from Last 3 Encounters:  07/28/21 151 lb (68.5 kg)  07/06/21 146 lb (66.2 kg)  06/21/21 148 lb (67.1 kg)     Cardiac cath:     07/06/2021  Diagnostic Dominance: Right    Other studies Reviewed: Additional studies/ records that were reviewed today include: *** Review of the above records demonstrates:  Please see elsewhere in the note.     ASSESSMENT AND PLAN:    CAD :    ***  He has disease as described and we are going to manage this medically.  He is going to continue with aggressive risk reduction.  Ischemic cardiomyopathy:   ***  We had a long discussion about this.  Going to repeat an echocardiogram.  He does not like taking medicines but he is going to allow me to uptitrate carvedilol to 12.5 mg twice daily.  Eventually I will increase the ARB.  He did not tolerate Entresto previously.  He had hyperkalemia.  Likely would not tolerate spironolactone.    Type 2 diabetes mellitus: A1c was down to *** 7.9 from 8.3.   Take is managed by  [name], FNP   HTN:   This is *** being managed in the context of treating his CHF   Dyslipidemia: HDL was ***  46 with an LDL of 92.  I will plan on switching him to Crestor 40 mg daily when I see him next.   Current medicines are reviewed at length with the patient today.  The patient does not have concerns regarding medicines.  The following changes have been made:   ***    Labs/ tests ordered today include:  ***  No orders of the defined types were placed in this encounter.     Disposition:   FU with me ***   Signed, Rollene Rotunda, MD  10/30/2021 9:38 PM    [facility]

## 2021-11-01 ENCOUNTER — Ambulatory Visit: Payer: Medicaid Other | Admitting: Cardiology

## 2021-11-01 DIAGNOSIS — E118 Type 2 diabetes mellitus with unspecified complications: Secondary | ICD-10-CM

## 2021-11-01 DIAGNOSIS — I25119 Atherosclerotic heart disease of native coronary artery with unspecified angina pectoris: Secondary | ICD-10-CM

## 2021-11-01 DIAGNOSIS — E785 Hyperlipidemia, unspecified: Secondary | ICD-10-CM

## 2021-11-01 DIAGNOSIS — I255 Ischemic cardiomyopathy: Secondary | ICD-10-CM

## 2021-11-01 DIAGNOSIS — I10 Essential (primary) hypertension: Secondary | ICD-10-CM

## 2021-11-13 ENCOUNTER — Encounter: Payer: Self-pay | Admitting: Family Medicine

## 2021-11-13 ENCOUNTER — Ambulatory Visit (INDEPENDENT_AMBULATORY_CARE_PROVIDER_SITE_OTHER): Payer: Medicaid Other | Admitting: Family Medicine

## 2021-11-13 DIAGNOSIS — R062 Wheezing: Secondary | ICD-10-CM

## 2021-11-13 DIAGNOSIS — J019 Acute sinusitis, unspecified: Secondary | ICD-10-CM

## 2021-11-13 MED ORDER — DOXYCYCLINE HYCLATE 100 MG PO TABS: 100.0000 mg | ORAL_TABLET | Freq: Two times a day (BID) | ORAL | 0 refills | Status: AC

## 2021-11-13 MED ORDER — ALBUTEROL SULFATE HFA 108 (90 BASE) MCG/ACT IN AERS: 1.0000 | INHALATION_SPRAY | Freq: Four times a day (QID) | RESPIRATORY_TRACT | 0 refills | Status: DC | PRN

## 2021-11-13 NOTE — Progress Notes (Signed)
Virtual Visit  Note Due to COVID-19 pandemic this visit was conducted virtually. This visit type was conducted due to national recommendations for restrictions regarding the COVID-19 Pandemic (e.g. social distancing, sheltering in place) in an effort to limit this patient's exposure and mitigate transmission in our community. All issues noted in this document were discussed and addressed.  A physical exam was not performed with this format.  I connected with [name] on 11/13/21 at 0928 by telephone and verified that I am speaking with the correct person using two identifiers. [name] is currently located at home and his wife is currently with him during the visit. The provider, Gabriel Earing, FNP is located in their office at time of visit.  I discussed the limitations, risks, security and privacy concerns of performing an evaluation and management service by telephone and the availability of in person appointments. I also discussed with the patient that there may be a patient responsible charge related to this service. The patient expressed understanding and agreed to proceed.  CC: URI  History and Present Illness:  URI  This is a new problem. The current episode started 1 to 4 weeks ago (10 days). The problem has been gradually worsening. Associated symptoms include congestion, coughing, headaches, a plugged ear sensation, rhinorrhea, sinus pain, sneezing, a sore throat and wheezing (with cough). Pertinent negatives include no abdominal pain, chest pain, diarrhea, dysuria, nausea, neck pain, rash or vomiting. He has tried antihistamine, acetaminophen, decongestant, sleep, increased fluids and steam for the symptoms. The treatment provided no relief.    Review of Systems  Constitutional:  Negative for chills and fever.  HENT:  Positive for congestion, rhinorrhea, sinus pain, sneezing and sore throat.   Respiratory:  Positive for cough and wheezing (with cough). Negative for  shortness of breath.   Cardiovascular:  Negative for chest pain.  Gastrointestinal:  Negative for abdominal pain, diarrhea, nausea and vomiting.  Genitourinary:  Negative for dysuria.  Musculoskeletal:  Negative for neck pain.  Skin:  Negative for rash.  Neurological:  Positive for headaches.     Observations/Objective: Alert and oriented x 3. Able to speak in full sentences without difficulty.   Assessment and Plan: [name] was seen today for uri.  Diagnoses and all orders for this visit:  Acute non-recurrent sinusitis, unspecified location URI symptoms x 10 days without improvement with OTC measures. Doxycyline as below given PCN allergy. Discussed symptomatic care and return precautions.  -     doxycycline (VIBRA-TABS) 100 MG tablet; Take 1 tablet (100 mg total) by mouth 2 (two) times daily for 7 days. 1 po bid  Wheezing -     albuterol (VENTOLIN HFA) 108 (90 Base) MCG/ACT inhaler; Inhale 1-2 puffs into the lungs every 6 (six) hours as needed for wheezing or shortness of breath.     Follow Up Instructions: Schedule chronic follow up    I discussed the assessment and treatment plan with the patient. The patient was provided an opportunity to ask questions and all were answered. The patient agreed with the plan and demonstrated an understanding of the instructions.   The patient was advised to call back or seek an in-person evaluation if the symptoms worsen or if the condition fails to improve as anticipated.  The above assessment and management plan was discussed with the patient. The patient verbalized understanding of and has agreed to the management plan. Patient is aware to call the clinic if symptoms persist or worsen. Patient is aware when to  return to the clinic for a follow-up visit. Patient educated on when it is appropriate to go to the emergency department.   Time call ended:  9:39  I provided 11 minutes of  non face-to-face time during this  encounter.    Gabriel Earing, FNP

## 2021-11-20 NOTE — Progress Notes (Unsigned)
Cardiology Office Note   Date:  11/20/2021   ID:  [name], DOB 27-Jul-1970, MRN [id number]  PCP:  Gabriel Earing, FNP  Cardiologist:   Rollene Rotunda, MD   No chief complaint on file.      History of Present Illness: [name] is a 51 y.o. male who presents with a hx of coronary disease.  He had an LAD BMS in 2010.  He had in-stent restenosis in 2018 treated with PCI.  Catheterization in January 2021 showed a patent LAD stent with an 80% distal LAD.  He had a 70% proximal RCA and an 85% mid RCA, the RCA was treated with PCI and DES.  The patient also has several other medical issues.  He has insulin-dependent diabetes.  He has treated dyslipidemia.  He has chronic renal insufficiency 3 with a GFR 44.  There is a history of seizures.   His last ejection fraction was 45% by echocardiogram May 2021.  In the past he has had problems with hyperkalemia.  He was on Entresto at one point and this was stopped and he was restarted on Losartan.  He was having increased chest pain.  I sent him for a cath and he had disease as below. He was managed medically.   Of note his ejection fraction was said to be 25%.  His end-diastolic pressure was low.  ***    ***  Since he was seen his breathing still gets somewhat short.  Not describing PND or orthopnea.  He has had no new weight gain or swelling.  He thinks his chest discomfort is less problematic.  He is not having any presyncope or syncope.  Past Medical History:  Diagnosis Date   Alcohol abuse    CAD (coronary artery disease), native coronary artery    a. 01/2009 s/p Anterior MI and thrombectomy with BMS to mid LAD;  b. 06/2016 Cath/PCI: LM nl, LAD 51m/d, LCX nl, OM1/2/3 nl, RDA 52m. Initial attempt @ PCI failed followed by successful CTO LAD PCI and DES (2.5x38 Synergy)-->rec for indefinte DAPT.   CKD (chronic kidney disease), stage III (HCC)    Essential hypertension    History of nonadherence to medical treatment    Hyperlipidemia     Ischemic cardiomyopathy    a. EF 40% by nuc in 2012;  b. 06/2016 Echo: EF 40-45%, apicalanteroseptal and apical AK, Gr2 DD, mod Ca2+ Ao annulus, mild MR.   Slurred speech - Chronic    Type 2 diabetes mellitus (HCC)     Past Surgical History:  Procedure Laterality Date   CORONARY CTO INTERVENTION N/A 07/11/2016   Procedure: Coronary CTO Intervention;  Surgeon: Peter M Swaziland, MD;  Location: [facility] INVASIVE CV LAB;  Service: Cardiovascular;  Laterality: N/A;   CORONARY STENT INTERVENTION N/A 07/09/2016   Procedure: Coronary Stent Intervention;  Surgeon: Runell Gess, MD;  Location: [facility] INVASIVE CV LAB;  Service: Cardiovascular;  Laterality: N/A;   CORONARY STENT INTERVENTION N/A 04/24/2019   Procedure: CORONARY STENT INTERVENTION;  Surgeon: Marykay Lex, MD;  Location: [facility] INVASIVE CV LAB;  Service: Cardiovascular;  Laterality: N/A;   Knee arthroscopic surgery Right    LEFT HEART CATH AND CORONARY ANGIOGRAPHY N/A 07/05/2016   Procedure: Left Heart Cath and Coronary Angiography;  Surgeon: Lyn Records, MD;  Location: [facility] INVASIVE CV LAB;  Service: Cardiovascular;  Laterality: N/A;   LEFT HEART CATH AND CORONARY ANGIOGRAPHY N/A 04/24/2019   Procedure: LEFT HEART CATH AND CORONARY ANGIOGRAPHY;  Surgeon:  Marykay Lex, MD;  Location: [facility] INVASIVE CV LAB;  Service: Cardiovascular;  Laterality: N/A;   LEFT HEART CATH AND CORONARY ANGIOGRAPHY N/A 07/06/2021   Procedure: LEFT HEART CATH AND CORONARY ANGIOGRAPHY;  Surgeon: Swaziland, Peter M, MD;  Location: [facility] INVASIVE CV LAB;  Service: Cardiovascular;  Laterality: N/A;     Current Outpatient Medications  Medication Sig Dispense Refill   Accu-Chek Softclix Lancets lancets Test BS QID Dx E11.9 400 each 3   albuterol (VENTOLIN HFA) 108 (90 Base) MCG/ACT inhaler Inhale 1-2 puffs into the lungs every 6 (six) hours as needed for wheezing or shortness of breath. 18 g 0   aspirin 81 MG chewable tablet Chew 1 tablet (81 mg total) by mouth daily with breakfast.  120 tablet 2   atorvastatin (LIPITOR) 80 MG tablet Take 1 tablet (80 mg total) by mouth at bedtime. 90 tablet 3   carvedilol (COREG) 12.5 MG tablet Take 1 tablet (12.5 mg total) by mouth 2 (two) times daily. 180 tablet 3   citalopram (CELEXA) 20 MG tablet Take 1 tablet (20 mg total) by mouth daily. 90 tablet 3   Continuous Blood Gluc Receiver (FREESTYLE LIBRE 2 READER) DEVI 1 each by Does not apply route daily. 1 each 0   Continuous Blood Gluc Sensor (FREESTYLE LIBRE 2 SENSOR) MISC 1 each by Does not apply route every 14 (fourteen) days. 2 each 2   dapagliflozin propanediol (FARXIGA) 10 MG TABS tablet Take 1 tablet (10 mg total) by mouth daily before breakfast. 90 tablet 1   doxycycline (VIBRA-TABS) 100 MG tablet Take 1 tablet (100 mg total) by mouth 2 (two) times daily for 7 days. 1 po bid 14 tablet 0   gabapentin (NEURONTIN) 300 MG capsule Take 1 capsule (300 mg total) by mouth 3 (three) times daily. 270 capsule 1   glucose blood (ACCU-CHEK GUIDE) test strip Test BS QID Dx E11.9 400 each 3   insulin aspart (NOVOLOG) 100 UNIT/ML FlexPen Inject 0-10 Units into the skin See admin instructions. insulin aspart (novoLOG) injection 0-10 Units 0-10 Units Subcutaneous, 3 times daily with meals CBG < 70: Implement Hypoglycemia Standing Orders and refer to Hypoglycemia Standing Orders sidebar report  CBG 70 - 120: 0 unit CBG 121 - 150: 0 unit  CBG 151 - 200: 1 unit CBG 201 - 250: 2 units CBG 251 - 300: 4 units CBG 301 - 350: 6 units  CBG 351 - 400: 8 units  CBG > 400: 10 units 15 mL 11   insulin glargine (LANTUS) 100 UNIT/ML injection Inject 0.18 mLs (18 Units total) into the skin at bedtime. (Patient taking differently: Inject 50 Units into the skin 2 (two) times daily.) 10 mL 4   Insulin Pen Needle (PEN NEEDLES) 32G X 5 MM MISC Use with Novolog Flexpen as directed 100 each 3   Insulin Syringe-Needle U-100 (TRUEPLUS INSULIN SYRINGE) 31G X 5/16" 1 ML MISC Use to inject insulin once daily Dx E11.8 100 each 3    isosorbide mononitrate (IMDUR) 60 MG 24 hr tablet Take 1 tablet (60 mg total) by mouth daily. 30 tablet 3   levocetirizine (XYZAL) 5 MG tablet Take 1 tablet (5 mg total) by mouth every evening. 30 tablet 2   nitroGLYCERIN (NITROSTAT) 0.4 MG SL tablet Place 1 tablet (0.4 mg total) under the tongue every 5 (five) minutes as needed for chest pain. 25 tablet 4   olmesartan (BENICAR) 20 MG tablet Take 1 tablet (20 mg total) by mouth daily. 90 tablet  1   Oxcarbazepine (TRILEPTAL) 300 MG tablet Take 1/2 tablet twice a day for 1 week, then increase to 1 tablet twice a day for 1 week, then increase to 2 tablets twice a day and continue 120 tablet 5   pantoprazole (PROTONIX) 40 MG tablet Take 1 tablet (40 mg total) by mouth daily. 90 tablet 1   sodium bicarbonate 650 MG tablet Take 650 mg by mouth 2 (two) times daily.     No current facility-administered medications for this visit.    Allergies:   Penicillins    ROS:  Please see the history of present illness.   Otherwise, review of systems are positive for ***.   All other systems are reviewed and negative.    PHYSICAL EXAM: VS:  There were no vitals taken for this visit. , BMI There is no height or weight on file to calculate BMI. GENERAL:  Well appearing NECK:  No jugular venous distention, waveform within normal limits, carotid upstroke brisk and symmetric, no bruits, no thyromegaly LUNGS:  Clear to auscultation bilaterally CHEST:  Unremarkable HEART:  PMI not displaced or sustained,S1 and S2 within normal limits, no S3, no S4, no clicks, no rubs, *** murmurs ABD:  Flat, positive bowel sounds normal in frequency in pitch, no bruits, no rebound, no guarding, no midline pulsatile mass, no hepatomegaly, no splenomegaly EXT:  2 plus pulses throughout, no edema, no cyanosis no clubbing     ***GENERAL:  Well appearing NECK:  No jugular venous distention, waveform within normal limits, carotid upstroke brisk and symmetric, no bruits, no  thyromegaly LUNGS:  Clear to auscultation bilaterally CHEST:  Unremarkable HEART:  PMI not displaced or sustained,S1 and S2 within normal limits, no S3, no S4, no clicks, no rubs, no murmurs ABD:  Flat, positive bowel sounds normal in frequency in pitch, no bruits, no rebound, no guarding, no midline pulsatile mass, no hepatomegaly, no splenomegaly EXT:  2 plus pulses throughout, no edema, no cyanosis no clubbing   EKG:  EKG is *** ordered today.    Recent Labs: 05/05/2021: ALT 12 06/21/2021: BUN 13; Creatinine, Ser 1.79; Hemoglobin 15.1; Platelets 290; Potassium 5.2; Sodium 141    Lipid Panel    Component Value Date/Time   CHOL 166 05/05/2021 1607   TRIG 161 (H) 05/05/2021 1607   HDL 46 05/05/2021 1607   CHOLHDL 3.6 05/05/2021 1607   CHOLHDL 2.1 07/06/2016 0534   VLDL 19 07/06/2016 0534   LDLCALC 92 05/05/2021 1607      Wt Readings from Last 3 Encounters:  07/28/21 151 lb (68.5 kg)  07/06/21 146 lb (66.2 kg)  06/21/21 148 lb (67.1 kg)     Cardiac cath:     07/06/2021  Diagnostic Dominance: Right    Other studies Reviewed: Additional studies/ records that were reviewed today include: *** Review of the above records demonstrates:  Please see elsewhere in the note.     ASSESSMENT AND PLAN:    CAD :    ***  He has disease as described and we are going to manage this medically.  He is going to continue with aggressive risk reduction.  Ischemic cardiomyopathy:   ***  We had a long discussion about this.  Going to repeat an echocardiogram.  He does not like taking medicines but he is going to allow me to uptitrate carvedilol to 12.5 mg twice daily.  Eventually I will increase the ARB.  He did not tolerate Entresto previously.  He had hyperkalemia.  Likely would  not tolerate spironolactone.    Type 2 diabetes mellitus: A1c was down to *** 7.9 from 8.3.   Take is managed by  Gabriel Earing, FNP   HTN:   This is *** being managed in the context of treating his CHF    Dyslipidemia: HDL was ***  46 with an LDL of 92.  I will plan on switching him to Crestor 40 mg daily when I see him next.   Current medicines are reviewed at length with the patient today.  The patient does not have concerns regarding medicines.  The following changes have been made:   ***   Labs/ tests ordered today include:  ***  No orders of the defined types were placed in this encounter.     Disposition:   FU with me ***   Signed, Rollene Rotunda, MD  11/20/2021 7:48 AM    [facility]

## 2021-11-22 ENCOUNTER — Ambulatory Visit: Payer: Medicaid Other | Admitting: Cardiology

## 2021-11-22 ENCOUNTER — Encounter: Payer: Self-pay | Admitting: Cardiology

## 2021-11-22 ENCOUNTER — Other Ambulatory Visit: Payer: Self-pay | Admitting: *Deleted

## 2021-11-22 VITALS — BP 132/88 | HR 84 | Ht 66.0 in | Wt 146.0 lb

## 2021-11-22 DIAGNOSIS — I10 Essential (primary) hypertension: Secondary | ICD-10-CM

## 2021-11-22 DIAGNOSIS — I255 Ischemic cardiomyopathy: Secondary | ICD-10-CM | POA: Diagnosis not present

## 2021-11-22 DIAGNOSIS — Z79899 Other long term (current) drug therapy: Secondary | ICD-10-CM

## 2021-11-22 DIAGNOSIS — E118 Type 2 diabetes mellitus with unspecified complications: Secondary | ICD-10-CM | POA: Diagnosis not present

## 2021-11-22 DIAGNOSIS — I25119 Atherosclerotic heart disease of native coronary artery with unspecified angina pectoris: Secondary | ICD-10-CM | POA: Diagnosis not present

## 2021-11-22 MED ORDER — ROSUVASTATIN CALCIUM 40 MG PO TABS: 40.0000 mg | ORAL_TABLET | Freq: Every day | ORAL | 3 refills | Status: DC

## 2021-11-22 MED ORDER — OLMESARTAN MEDOXOMIL 40 MG PO TABS: 40.0000 mg | ORAL_TABLET | Freq: Every day | ORAL | 3 refills | Status: DC

## 2021-11-22 NOTE — Patient Instructions (Signed)
Medication Instructions:  Please discontinue your atorvastatin and start Crestor 40 mg a day. Increase Olmesartan to 40 mg a day. Continue all other medications as listed.  *If you need a refill on your cardiac medications before your next appointment, please call your pharmacy*  Lab Work: Please have blood work in 2 weeks at [facility] (BMP, HA1c)  And lipid panel in 3 months.  If you have labs (blood work) drawn today and your tests are completely normal, you will receive your results only by: MyChart Message (if you have MyChart) OR A paper copy in the mail If you have any lab test that is abnormal or we need to change your treatment, we will call you to review the results.  Follow-Up: At [facility], you and your health needs are our priority.  As part of our continuing mission to provide you with exceptional heart care, we have created designated Provider Care Teams.  These Care Teams include your primary Cardiologist (physician) and Advanced Practice Providers (APPs -  Physician Assistants and Nurse Practitioners) who all work together to provide you with the care you need, when you need it.  We recommend signing up for the patient portal called "MyChart".  Sign up information is provided on this After Visit Summary.  MyChart is used to connect with patients for Virtual Visits (Telemedicine).  Patients are able to view lab/test results, encounter notes, upcoming appointments, etc.  Non-urgent messages can be sent to your provider as well.   To learn more about what you can do with MyChart, go to [email or web address].    Your next appointment:   3 month(s)  The format for your next appointment:   In Person  Provider:   Rollene Rotunda, MD     Important Information About Sugar

## 2021-12-12 ENCOUNTER — Other Ambulatory Visit: Payer: Medicaid Other

## 2021-12-12 DIAGNOSIS — Z79899 Other long term (current) drug therapy: Secondary | ICD-10-CM | POA: Diagnosis not present

## 2021-12-12 DIAGNOSIS — I10 Essential (primary) hypertension: Secondary | ICD-10-CM | POA: Diagnosis not present

## 2021-12-12 DIAGNOSIS — I255 Ischemic cardiomyopathy: Secondary | ICD-10-CM | POA: Diagnosis not present

## 2021-12-12 DIAGNOSIS — E118 Type 2 diabetes mellitus with unspecified complications: Secondary | ICD-10-CM | POA: Diagnosis not present

## 2021-12-12 DIAGNOSIS — I25119 Atherosclerotic heart disease of native coronary artery with unspecified angina pectoris: Secondary | ICD-10-CM | POA: Diagnosis not present

## 2021-12-13 LAB — BASIC METABOLIC PANEL
BUN/Creatinine Ratio: 8 — ABNORMAL LOW (ref 9–20)
BUN: 16 mg/dL (ref 6–24)
CO2: 18 mmol/L — ABNORMAL LOW (ref 20–29)
Calcium: 9 mg/dL (ref 8.7–10.2)
Chloride: 101 mmol/L (ref 96–106)
Creatinine, Ser: 2.12 mg/dL — ABNORMAL HIGH (ref 0.76–1.27)
Glucose: 286 mg/dL — ABNORMAL HIGH (ref 70–99)
Potassium: 4.6 mmol/L (ref 3.5–5.2)
Sodium: 134 mmol/L (ref 134–144)
eGFR: 37 mL/min/{1.73_m2} — ABNORMAL LOW (ref >59)

## 2021-12-13 LAB — LIPID PANEL
Chol/HDL Ratio: 3.1 ratio (ref 0.0–5.0)
Cholesterol, Total: 127 mg/dL (ref 100–199)
HDL: 41 mg/dL (ref >39)
LDL Chol Calc (NIH): 70 mg/dL (ref 0–99)
Triglycerides: 82 mg/dL (ref 0–149)
VLDL Cholesterol Cal: 16 mg/dL (ref 5–40)

## 2021-12-13 LAB — HEMOGLOBIN A1C
Est. average glucose Bld gHb Est-mCnc: 206 mg/dL
Hgb A1c MFr Bld: 8.8 % — ABNORMAL HIGH (ref 4.8–5.6)

## 2021-12-14 ENCOUNTER — Other Ambulatory Visit: Payer: Self-pay | Admitting: *Deleted

## 2021-12-14 DIAGNOSIS — N183 Chronic kidney disease, stage 3 unspecified: Secondary | ICD-10-CM

## 2021-12-29 ENCOUNTER — Encounter: Payer: Self-pay | Admitting: *Deleted

## 2022-02-13 NOTE — Progress Notes (Deleted)
Cardiology Office Note   Date:  02/13/2022   ID:  [name], DOB 04/07/1970, MRN [id number]  PCP:  Gabriel EaringMorgan, Tiffany M, FNP  Cardiologist:   Rollene RotundaJames Kazi Montoro, MD    No chief complaint on file.     History of Present Illness: [name] is a 51 y.o. male who presents with a hx of coronary disease.  He had an LAD BMS in 2010.  He had in-stent restenosis in 2018 treated with PCI.  Catheterization in January 2021 showed a patent LAD stent with an 80% distal LAD.  He had a 70% proximal RCA and an 85% mid RCA, the RCA was treated with PCI and DES.  The patient also has several other medical issues.  He has insulin-dependent diabetes.  He has treated dyslipidemia.  He has chronic renal insufficiency 3 with a GFR 44.  There is a history of seizures.   His last ejection fraction was 45% by echocardiogram May 2021.  In the past he has had problems with hyperkalemia.  He was on Entresto at one point and this was stopped and he was restarted on Losartan.  He was having increased chest pain.  I sent him for a cath and he had disease as below. He was managed medically.   Of note his ejection fraction was said to be 25%.  His end-diastolic pressure was low.  Since I last saw him ***  ***  he has had no new cardiovascular complaints.  He denies any chest pressure, neck or arm discomfort.  He had no new palpitations, presyncope or syncope.  He has had no weight gain or edema.  He is staying active and doing walking.   Past Medical History:  Diagnosis Date   Alcohol abuse    CAD (coronary artery disease), native coronary artery    a. 01/2009 s/p Anterior MI and thrombectomy with BMS to mid LAD;  b. 06/2016 Cath/PCI: LM nl, LAD 6221m/d, LCX nl, OM1/2/3 nl, RDA 1170m. Initial attempt @ PCI failed followed by successful CTO LAD PCI and DES (2.5x38 Synergy)-->rec for indefinte DAPT.   CKD (chronic kidney disease), stage III (HCC)    Essential hypertension    History of nonadherence to medical treatment     Hyperlipidemia    Ischemic cardiomyopathy    a. EF 40% by nuc in 2012;  b. 06/2016 Echo: EF 40-45%, apicalanteroseptal and apical AK, Gr2 DD, mod Ca2+ Ao annulus, mild MR.   Slurred speech - Chronic    Type 2 diabetes mellitus (HCC)     Past Surgical History:  Procedure Laterality Date   CORONARY CTO INTERVENTION N/A 07/11/2016   Procedure: Coronary CTO Intervention;  Surgeon: Peter M SwazilandJordan, MD;  [facility];  Service: Cardiovascular;  Laterality: N/A;   CORONARY STENT INTERVENTION N/A 07/09/2016   Procedure: Coronary Stent Intervention;  Surgeon: Runell GessJonathan J Berry, MD;  Location: [facility] INVASIVE CV LAB;  Service: Cardiovascular;  Laterality: N/A;   CORONARY STENT INTERVENTION N/A 04/24/2019   Procedure: CORONARY STENT INTERVENTION;  Surgeon: Marykay LexHarding, David W, MD;  [facility];  Service: Cardiovascular;  Laterality: N/A;   Knee arthroscopic surgery Right    LEFT HEART CATH AND CORONARY ANGIOGRAPHY N/A 07/05/2016   Procedure: Left Heart Cath and Coronary Angiography;  Surgeon: Lyn RecordsHenry W Smith, MD;  [facility];  Service: Cardiovascular;  Laterality: N/A;   LEFT HEART CATH AND CORONARY ANGIOGRAPHY N/A 04/24/2019   Procedure: LEFT HEART CATH  AND CORONARY ANGIOGRAPHY;  Surgeon: Marykay Lex, MD;  Location: [facility] INVASIVE CV LAB;  Service: Cardiovascular;  Laterality: N/A;   LEFT HEART CATH AND CORONARY ANGIOGRAPHY N/A 07/06/2021   Procedure: LEFT HEART CATH AND CORONARY ANGIOGRAPHY;  Surgeon: Swaziland, Peter M, MD;  Location: [facility] INVASIVE CV LAB;  Service: Cardiovascular;  Laterality: N/A;     Current Outpatient Medications  Medication Sig Dispense Refill   Accu-Chek Softclix Lancets lancets Test BS QID Dx E11.9 400 each 3   albuterol (VENTOLIN HFA) 108 (90 Base) MCG/ACT inhaler Inhale 1-2 puffs into the lungs every 6 (six) hours as needed for wheezing or shortness of breath. 18 g 0   aspirin 81 MG chewable tablet Chew 1 tablet (81 mg total) by mouth daily  with breakfast. 120 tablet 2   carvedilol (COREG) 12.5 MG tablet Take 1 tablet (12.5 mg total) by mouth 2 (two) times daily. 180 tablet 3   citalopram (CELEXA) 20 MG tablet Take 1 tablet (20 mg total) by mouth daily. 90 tablet 3   Continuous Blood Gluc Receiver (FREESTYLE LIBRE 2 READER) DEVI 1 each by Does not apply route daily. 1 each 0   Continuous Blood Gluc Sensor (FREESTYLE LIBRE 2 SENSOR) MISC 1 each by Does not apply route every 14 (fourteen) days. 2 each 2   dapagliflozin propanediol (FARXIGA) 10 MG TABS tablet Take 1 tablet (10 mg total) by mouth daily before breakfast. 90 tablet 1   gabapentin (NEURONTIN) 300 MG capsule Take 1 capsule (300 mg total) by mouth 3 (three) times daily. 270 capsule 1   glucose blood (ACCU-CHEK GUIDE) test strip Test BS QID Dx E11.9 400 each 3   insulin aspart (NOVOLOG) 100 UNIT/ML FlexPen Inject 0-10 Units into the skin See admin instructions. insulin aspart (novoLOG) injection 0-10 Units 0-10 Units Subcutaneous, 3 times daily with meals CBG < 70: Implement Hypoglycemia Standing Orders and refer to Hypoglycemia Standing Orders sidebar report  CBG 70 - 120: 0 unit CBG 121 - 150: 0 unit  CBG 151 - 200: 1 unit CBG 201 - 250: 2 units CBG 251 - 300: 4 units CBG 301 - 350: 6 units  CBG 351 - 400: 8 units  CBG > 400: 10 units 15 mL 11   insulin glargine (LANTUS) 100 UNIT/ML injection Inject 0.18 mLs (18 Units total) into the skin at bedtime. (Patient taking differently: Inject 50 Units into the skin 2 (two) times daily.) 10 mL 4   Insulin Pen Needle (PEN NEEDLES) 32G X 5 MM MISC Use with Novolog Flexpen as directed 100 each 3   Insulin Syringe-Needle U-100 (TRUEPLUS INSULIN SYRINGE) 31G X 5/16" 1 ML MISC Use to inject insulin once daily Dx E11.8 100 each 3   isosorbide mononitrate (IMDUR) 60 MG 24 hr tablet Take 1 tablet (60 mg total) by mouth daily. 30 tablet 3   levocetirizine (XYZAL) 5 MG tablet Take 1 tablet (5 mg total) by mouth every evening. 30 tablet 2    nitroGLYCERIN (NITROSTAT) 0.4 MG SL tablet Place 1 tablet (0.4 mg total) under the tongue every 5 (five) minutes as needed for chest pain. 25 tablet 4   olmesartan (BENICAR) 40 MG tablet Take 1 tablet (40 mg total) by mouth daily. 90 tablet 3   Oxcarbazepine (TRILEPTAL) 300 MG tablet Take 1/2 tablet twice a day for 1 week, then increase to 1 tablet twice a day for 1 week, then increase to 2 tablets twice a day and continue 120 tablet 5  pantoprazole (PROTONIX) 40 MG tablet Take 1 tablet (40 mg total) by mouth daily. 90 tablet 1   rosuvastatin (CRESTOR) 40 MG tablet Take 1 tablet (40 mg total) by mouth daily. 90 tablet 3   sodium bicarbonate 650 MG tablet Take 650 mg by mouth 2 (two) times daily.     No current facility-administered medications for this visit.    Allergies:   Penicillins    ROS:  Please see the history of present illness.   Otherwise, review of systems are positive for ***.   All other systems are reviewed and negative.    PHYSICAL EXAM: VS:  There were no vitals taken for this visit. , BMI There is no height or weight on file to calculate BMI. GENERAL:  Well appearing NECK:  No jugular venous distention, waveform within normal limits, carotid upstroke brisk and symmetric, no bruits, no thyromegaly LUNGS:  Clear to auscultation bilaterally CHEST:  Unremarkable HEART:  PMI not displaced or sustained,S1 and S2 within normal limits, no S3, no S4, no clicks, no rubs, *** murmurs ABD:  Flat, positive bowel sounds normal in frequency in pitch, no bruits, no rebound, no guarding, no midline pulsatile mass, no hepatomegaly, no splenomegaly EXT:  2 plus pulses throughout, no edema, no cyanosis no clubbing     ***GENERAL:  Well appearing NECK:  No jugular venous distention, waveform within normal limits, carotid upstroke brisk and symmetric, no bruits, no thyromegaly LUNGS:  Clear to auscultation bilaterally CHEST:  Unremarkable HEART:  PMI not displaced or sustained,S1 and S2  within normal limits, no S3, no S4, no clicks, no rubs, no murmurs ABD:  Flat, positive bowel sounds normal in frequency in pitch, no bruits, no rebound, no guarding, no midline pulsatile mass, no hepatomegaly, no splenomegaly EXT:  2 plus pulses throughout, no edema, no cyanosis no clubbing   EKG:  EKG is *** ordered today. ***   Recent Labs: 05/05/2021: ALT 12 06/21/2021: Hemoglobin 15.1; Platelets 290 12/12/2021: BUN 16; Creatinine, Ser 2.12; Potassium 4.6; Sodium 134    Lipid Panel    Component Value Date/Time   CHOL 127 12/12/2021 0928   TRIG 82 12/12/2021 0928   HDL 41 12/12/2021 0928   CHOLHDL 3.1 12/12/2021 0928   CHOLHDL 2.1 07/06/2016 0534   VLDL 19 07/06/2016 0534   LDLCALC 70 12/12/2021 0928      Wt Readings from Last 3 Encounters:  11/22/21 146 lb (66.2 kg)  07/28/21 151 lb (68.5 kg)  07/06/21 146 lb (66.2 kg)     Cardiac cath:     07/06/2021  Diagnostic Dominance: Right    Other studies Reviewed: Additional studies/ records that were reviewed today include: *** Review of the above records demonstrates:  Please see elsewhere in the note.     ASSESSMENT AND PLAN:    CAD :  ***   He has disease as above.  We are continuing with risk reduction.  Ischemic cardiomyopathy:    ***  I am going to increase his Benicar to 40 mg daily and check a basic metabolic profile in 2 weeks.  He has not tolerated Entresto.  I do not want to start spironolactone because of his renal insufficiency and previous hyperkalemia.  I will consider follow-up echo and discussion of an ICD in the months to come.  Type 2 diabetes mellitus: A1c was ***  7.9.  I am going to repeat an A1c.    HTN:   ***  This is being managed in the context  of managing his cardiomyopathy.   Dyslipidemia:     HDL ***  was 92 previously and I am going to stop Lipitor and start Crestor 40 mg daily.  I will check a lipid profile again in 3 months.    Current medicines are reviewed at length with the  patient today.  The patient does not have concerns regarding medicines.  The following changes have been made:   ***  Labs/ tests ordered today include:  ***  No orders of the defined types were placed in this encounter.    Disposition:   FU with me *** months.   Signed, Rollene Rotunda, MD  02/13/2022 8:15 PM    [facility]

## 2022-02-14 ENCOUNTER — Ambulatory Visit: Payer: Medicaid Other | Admitting: Cardiology

## 2022-02-14 DIAGNOSIS — I10 Essential (primary) hypertension: Secondary | ICD-10-CM

## 2022-02-14 DIAGNOSIS — I25119 Atherosclerotic heart disease of native coronary artery with unspecified angina pectoris: Secondary | ICD-10-CM

## 2022-02-14 DIAGNOSIS — E785 Hyperlipidemia, unspecified: Secondary | ICD-10-CM

## 2022-02-14 DIAGNOSIS — E118 Type 2 diabetes mellitus with unspecified complications: Secondary | ICD-10-CM

## 2022-04-22 DIAGNOSIS — L02225 Furuncle of perineum: Secondary | ICD-10-CM | POA: Diagnosis not present

## 2022-05-06 NOTE — Progress Notes (Unsigned)
Cardiology Office Note   Date:  05/09/2022   ID:  [name], DOB 1970/06/08, MRN [id number]  PCP:  [name], FNP  Cardiologist:   [name], MD    Chief Complaint  Patient presents with   Chest Pain      History of Present Illness: [name] is a 52 y.o. male who presents with a hx of coronary disease.  He had an LAD BMS in 2010.  He had in-stent restenosis in 2018 treated with PCI.  Catheterization in January 2021 showed a patent LAD stent with an 80% distal LAD.  He had a 70% proximal RCA and an 85% mid RCA, the RCA was treated with PCI and DES.  The patient also has several other medical issues.  He has insulin-dependent diabetes.  He has treated dyslipidemia.  He has chronic renal insufficiency 3 with a GFR 44.  There is a history of seizures.   His last ejection fraction was 45% by echocardiogram May 2021.  In the past he has had problems with hyperkalemia.  He was on Entresto at one point and this was stopped and he was restarted on Losartan.  He was having increased chest pain.  I sent him for a cath and he had disease as below. He was managed medically.   Of note his ejection fraction was said to be 25%.  His end-diastolic pressure was low.  At the last visit I titrated his ARB.  I think he is compliant with his medications.  His blood pressure is still elevated.  He says he is taking nitroglycerin a couple times a day but then he says he has not had to replace his nitroglycerin bottle.  He says his pain is the same as when we cathed him last year.  However, at the same time he says he is taking more nitroglycerin which he cannot rectify with the fact that he has not had to have renewals and has not gone through many bottles.  He says he walks for exercise routinely and he does well with this.  He is not having any palpitations, presyncope or syncope.  He had no weight gain or edema.  Past Medical History:  Diagnosis Date   Alcohol abuse    CAD (coronary  artery disease), native coronary artery    a. 01/2009 s/p Anterior MI and thrombectomy with BMS to mid LAD;  b. 06/2016 Cath/PCI: LM nl, LAD 43md, LCX nl, OM1/2/3 nl, RDA 650mInitial attempt @ PCI failed followed by successful CTO LAD PCI and DES (2.5x38 Synergy)-->rec for indefinte DAPT.   CKD (chronic kidney disease), stage III [place]   Essential hypertension    History of nonadherence to medical treatment    Hyperlipidemia    Ischemic cardiomyopathy    a. EF 40% by nuc in 2012;  b. 06/2016 Echo: EF 40-45%, apicalanteroseptal and apical AK, Gr2 DD, mod Ca2+ Ao annulus, mild MR.   Slurred speech - Chronic    Type 2 diabetes mellitus ([facility]    Past Surgical History:  Procedure Laterality Date   CORONARY CTO INTERVENTION N/A 07/11/2016   Procedure: Coronary CTO Intervention;  Surgeon: Peter M JoMartiniqueMD;  [facility];  Service: Cardiovascular;  Laterality: N/A;   CORONARY STENT INTERVENTION N/A 07/09/2016   Procedure: Coronary Stent Intervention;  Surgeon: JoLorretta HarpMD;  [facility];  Service: Cardiovascular;  Laterality: N/A;   CORONARY STENT INTERVENTION N/A 04/24/2019  Procedure: CORONARY STENT INTERVENTION;  Surgeon: Leonie Man, MD;  Location: [facility];  Service: Cardiovascular;  Laterality: N/A;   Knee arthroscopic surgery Right    LEFT HEART CATH AND CORONARY ANGIOGRAPHY N/A 07/05/2016   Procedure: Left Heart Cath and Coronary Angiography;  Surgeon: Belva Crome, MD;  Location: [facility];  Service: Cardiovascular;  Laterality: N/A;   LEFT HEART CATH AND CORONARY ANGIOGRAPHY N/A 04/24/2019   Procedure: LEFT HEART CATH AND CORONARY ANGIOGRAPHY;  Surgeon: Leonie Man, MD;  Location: [facility];  Service: Cardiovascular;  Laterality: N/A;   LEFT HEART CATH AND CORONARY ANGIOGRAPHY N/A 07/06/2021   Procedure: LEFT HEART CATH AND CORONARY ANGIOGRAPHY;  Surgeon: Martinique, Peter M, MD;  Location: [facility];   Service: Cardiovascular;  Laterality: N/A;     Current Outpatient Medications  Medication Sig Dispense Refill   albuterol (VENTOLIN HFA) 108 (90 Base) MCG/ACT inhaler Inhale 1-2 puffs into the lungs every 6 (six) hours as needed for wheezing or shortness of breath. 18 g 0   aspirin 81 MG chewable tablet Chew 1 tablet (81 mg total) by mouth daily with breakfast. 120 tablet 2   carvedilol (COREG) 12.5 MG tablet Take 1 tablet (12.5 mg total) by mouth 2 (two) times daily. 180 tablet 3   citalopram (CELEXA) 20 MG tablet Take 1 tablet (20 mg total) by mouth daily. 90 tablet 3   dapagliflozin propanediol (FARXIGA) 10 MG TABS tablet Take 1 tablet (10 mg total) by mouth daily before breakfast. 90 tablet 1   gabapentin (NEURONTIN) 300 MG capsule Take 1 capsule (300 mg total) by mouth 3 (three) times daily. 270 capsule 1   insulin aspart (NOVOLOG) 100 UNIT/ML FlexPen Inject 0-10 Units into the skin See admin instructions. insulin aspart (novoLOG) injection 0-10 Units 0-10 Units Subcutaneous, 3 times daily with meals CBG < 70: Implement Hypoglycemia Standing Orders and refer to Hypoglycemia Standing Orders sidebar report  CBG 70 - 120: 0 unit CBG 121 - 150: 0 unit  CBG 151 - 200: 1 unit CBG 201 - 250: 2 units CBG 251 - 300: 4 units CBG 301 - 350: 6 units  CBG 351 - 400: 8 units  CBG > 400: 10 units 15 mL 11   insulin glargine (LANTUS) 100 UNIT/ML injection Inject 0.18 mLs (18 Units total) into the skin at bedtime. (Patient taking differently: Inject 50 Units into the skin 2 (two) times daily.) 10 mL 4   isosorbide mononitrate (IMDUR) 120 MG 24 hr tablet Take 1 tablet (120 mg total) by mouth daily. 90 tablet 3   levocetirizine (XYZAL) 5 MG tablet Take 1 tablet (5 mg total) by mouth every evening. 30 tablet 2   nitroGLYCERIN (NITROSTAT) 0.4 MG SL tablet Place 1 tablet (0.4 mg total) under the tongue every 5 (five) minutes as needed for chest pain. 25 tablet 4   olmesartan (BENICAR) 40 MG tablet Take 1 tablet (40  mg total) by mouth daily. 90 tablet 3   Oxcarbazepine (TRILEPTAL) 300 MG tablet Take 1/2 tablet twice a day for 1 week, then increase to 1 tablet twice a day for 1 week, then increase to 2 tablets twice a day and continue 120 tablet 5   pantoprazole (PROTONIX) 40 MG tablet Take 1 tablet (40 mg total) by mouth daily. 90 tablet 1   rosuvastatin (CRESTOR) 40 MG tablet Take 1 tablet (40 mg total) by mouth daily. 90 tablet 3   sodium bicarbonate 650  MG tablet Take 650 mg by mouth 2 (two) times daily.     Accu-Chek Softclix Lancets lancets Test BS QID Dx E11.9 400 each 3   Continuous Blood Gluc Receiver (FREESTYLE LIBRE 2 READER) DEVI 1 each by Does not apply route daily. 1 each 0   Continuous Blood Gluc Sensor (FREESTYLE LIBRE 2 SENSOR) MISC 1 each by Does not apply route every 14 (fourteen) days. 2 each 2   glucose blood (ACCU-CHEK GUIDE) test strip Test BS QID Dx E11.9 400 each 3   Insulin Pen Needle (PEN NEEDLES) 32G X 5 MM MISC Use with Novolog Flexpen as directed 100 each 3   Insulin Syringe-Needle U-100 (TRUEPLUS INSULIN SYRINGE) 31G X 5/16" 1 ML MISC Use to inject insulin once daily Dx E11.8 100 each 3   No current facility-administered medications for this visit.    Allergies:   Penicillins    ROS:  Please see the history of present illness.   Otherwise, review of systems are positive for noneno.   All other systems are reviewed and negative.    PHYSICAL EXAM: VS:  BP (!) 152/98   Pulse (!) 105   Ht 5' 6"$  (1.676 m)   Wt 149 lb (67.6 kg)   BMI 24.05 kg/m  , BMI Body mass index is 24.05 kg/m. GENERAL:  Well appearing NECK:  No jugular venous distention, waveform within normal limits, carotid upstroke brisk and symmetric, no bruits, no thyromegaly LUNGS:  Clear to auscultation bilaterally CHEST:  Unremarkable HEART:  PMI not displaced or sustained,S1 and S2 within normal limits, no S3, no S4, no clicks, no rubs, no murmurs ABD:  Flat, positive bowel sounds normal in frequency in  pitch, no bruits, no rebound, no guarding, no midline pulsatile mass, no hepatomegaly, no splenomegaly EXT:  2 plus pulses throughout, no edema, no cyanosis no clubbing   EKG:  EKG is  ordered today. Sinus tachycardia, rate 105, old inferior infarct, anterolateral infarct, no acute ST-T wave changes.   Recent Labs: 06/21/2021: Hemoglobin 15.1; Platelets 290 12/12/2021: BUN 16; Creatinine, Ser 2.12; Potassium 4.6; Sodium 134    Lipid Panel    Component Value Date/Time   CHOL 127 12/12/2021 0928   TRIG 82 12/12/2021 0928   HDL 41 12/12/2021 0928   CHOLHDL 3.1 12/12/2021 0928   CHOLHDL 2.1 07/06/2016 0534   VLDL 19 07/06/2016 0534   LDLCALC 70 12/12/2021 0928      Wt Readings from Last 3 Encounters:  05/09/22 149 lb (67.6 kg)  11/22/21 146 lb (66.2 kg)  07/28/21 151 lb (68.5 kg)     Cardiac cath:     07/06/2021  Diagnostic Dominance: Right    Other studies Reviewed: Additional studies/ records that were reviewed today include: Labs, called pharmacy to verify NTG use.  Review of the above records demonstrates:  Please see elsewhere in the note.     ASSESSMENT AND PLAN:    CAD : He says he is having increased chest pain and using lots of nitroglycerin but then he says it is the same as his symptoms will be Him last year and we checked with the pharmacy that he picked up his pills in November and the only had 25 at that time and he is still using the same bottle.I am going to go ahead and increase his Imdur to 120 mg daily.    Ischemic cardiomyopathy:   He has not wanted to take Entresto.  He agreed to the Benicar.  Type 2 diabetes  mellitus: A1c was up to 8.8.  I am sending a note to his primary to see if she would like to refer him to endocrinology.   HTN:   This is elevated and I am going to be sending him a blood pressure cuff to keep an eye on this.  Again med titration is difficult with his renal insufficiency and his intolerance of medications in the past.  I am a  little bit leery of his compliance.   Dyslipidemia: HDL was improved after switching his statin.  His last LDL was 70.  No change in therapy.    Current medicines are reviewed at length with the patient today.  The patient does not have concerns regarding medicines.  The following changes have been made:   As above  Labs/ tests ordered today include:  None  Orders Placed This Encounter  Procedures   EKG 12-Lead     Disposition:   FU with me 3 months.   Signed, [name], MD  05/09/2022 2:54 PM    [facility]

## 2022-05-09 ENCOUNTER — Ambulatory Visit: Payer: Medicaid Other | Admitting: Cardiology

## 2022-05-09 ENCOUNTER — Encounter: Payer: Self-pay | Admitting: Cardiology

## 2022-05-09 VITALS — BP 152/98 | HR 105 | Ht 66.0 in | Wt 149.0 lb

## 2022-05-09 DIAGNOSIS — E118 Type 2 diabetes mellitus with unspecified complications: Secondary | ICD-10-CM

## 2022-05-09 DIAGNOSIS — I10 Essential (primary) hypertension: Secondary | ICD-10-CM | POA: Diagnosis not present

## 2022-05-09 DIAGNOSIS — E785 Hyperlipidemia, unspecified: Secondary | ICD-10-CM

## 2022-05-09 DIAGNOSIS — I25119 Atherosclerotic heart disease of native coronary artery with unspecified angina pectoris: Secondary | ICD-10-CM | POA: Diagnosis not present

## 2022-05-09 MED ORDER — ISOSORBIDE MONONITRATE ER 120 MG PO TB24: 120.0000 mg | ORAL_TABLET | Freq: Every day | ORAL | 3 refills | Status: DC

## 2022-05-09 NOTE — Patient Instructions (Signed)
Medication Instructions:  Please increase Isosorbide to 120 mg a day. Continue all other medications as listed.  *If you need a refill on your cardiac medications before your next appointment, please call your pharmacy*  Follow-Up: At [facility], you and your health needs are our priority.  As part of our continuing mission to provide you with exceptional heart care, we have created designated Provider Care Teams.  These Care Teams include your primary Cardiologist (physician) and Advanced Practice Providers (APPs -  Physician Assistants and Nurse Practitioners) who all work together to provide you with the care you need, when you need it.  We recommend signing up for the patient portal called "MyChart".  Sign up information is provided on this After Visit Summary.  MyChart is used to connect with patients for Virtual Visits (Telemedicine).  Patients are able to view lab/test results, encounter notes, upcoming appointments, etc.  Non-urgent messages can be sent to your provider as well.   To learn more about what you can do with MyChart, go to [email or web address].    Your next appointment:   3 month(s)  Provider:   [name], MD

## 2022-05-16 DIAGNOSIS — H6123 Impacted cerumen, bilateral: Secondary | ICD-10-CM | POA: Diagnosis not present

## 2022-05-21 ENCOUNTER — Ambulatory Visit: Payer: Medicaid Other | Admitting: Family Medicine

## 2022-05-21 ENCOUNTER — Telehealth: Payer: Self-pay | Admitting: Neurology

## 2022-05-21 NOTE — Telephone Encounter (Signed)
Pt's son called in and left a message with the access nurse on 05/20/22 at 8:09 PM. The pt's son states the pt had a seizure at dinner and at night he has muscle shudders after falling asleep. Son states father has absent blank stare seizures, had one tonight. At night before he goes to sleep he will have body shuttering lasting for a few minutes. He states they were told the involuntary muscle jerking are spasms. Son is not with father at time of call but provided contact number to call his mother. Wife contacted and states the muscle jerking only occurs when he goes to sleep. States has been nightly for past few weeks.  Full access nurse report is in Dr. Amparo Bristol box

## 2022-05-21 NOTE — Telephone Encounter (Signed)
Patient has not been seen in over 2 years. Who has been prescribing his seizure medication? We will put him on waitlist, but pls ask seizure questions (not sure if he is even on seizure med, unless he has been seeing a different neurologist since 2021). Thanks

## 2022-05-22 ENCOUNTER — Ambulatory Visit (INDEPENDENT_AMBULATORY_CARE_PROVIDER_SITE_OTHER): Payer: Medicaid Other | Admitting: Family

## 2022-05-22 ENCOUNTER — Encounter: Payer: Self-pay | Admitting: Family Medicine

## 2022-05-22 ENCOUNTER — Encounter: Payer: Self-pay | Admitting: Family

## 2022-05-22 VITALS — BP 112/74 | HR 91 | Temp 97.6°F | Ht 66.0 in | Wt 147.0 lb

## 2022-05-22 DIAGNOSIS — H6123 Impacted cerumen, bilateral: Secondary | ICD-10-CM

## 2022-05-22 DIAGNOSIS — H938X3 Other specified disorders of ear, bilateral: Secondary | ICD-10-CM

## 2022-05-22 DIAGNOSIS — H9203 Otalgia, bilateral: Secondary | ICD-10-CM | POA: Diagnosis not present

## 2022-05-22 NOTE — Progress Notes (Signed)
Subjective:    Patient ID: [name], male    DOB: 09-07-1970, 52 y.o.   MRN: [id number]  Chief Complaint  Patient presents with   Ear Pain   HEAD STOPPED UP     OVER A WEEK    PT presents to the office today with bilateral ear fullness that started over a week.  Ear Fullness  There is pain in both ears. This is a new problem. The current episode started 1 to 4 weeks ago. The problem occurs constantly. The problem has been unchanged. There has been no fever. The pain is at a severity of 6/10. The pain is moderate. Associated symptoms include coughing, hearing loss and rhinorrhea. Pertinent negatives include no headaches or sore throat. He has tried acetaminophen for the symptoms.      Review of Systems  HENT:  Positive for hearing loss and rhinorrhea. Negative for sore throat.   Respiratory:  Positive for cough.   Neurological:  Negative for headaches.  All other systems reviewed and are negative.      Objective:   Physical Exam Vitals reviewed.  Constitutional:      General: He is not in acute distress.    Appearance: He is well-developed.  HENT:     Head: Normocephalic.     Right Ear: There is impacted cerumen.     Left Ear: There is impacted cerumen.  Eyes:     General:        Right eye: No discharge.        Left eye: No discharge.     Pupils: Pupils are equal, round, and reactive to light.  Neck:     Thyroid: No thyromegaly.  Cardiovascular:     Rate and Rhythm: Normal rate and regular rhythm.     Heart sounds: Normal heart sounds. No murmur heard. Pulmonary:     Effort: Pulmonary effort is normal. No respiratory distress.     Breath sounds: Normal breath sounds. No wheezing.  Abdominal:     General: Bowel sounds are normal. There is no distension.     Palpations: Abdomen is soft.     Tenderness: There is no abdominal tenderness.  Musculoskeletal:        General: No tenderness. Normal range of motion.     Cervical back: Normal range of motion and neck  supple.  Skin:    General: Skin is warm and dry.     Findings: No erythema or rash.  Neurological:     Mental Status: He is alert and oriented to person, place, and time.     Cranial Nerves: No cranial nerve deficit.     Deep Tendon Reflexes: Reflexes are normal and symmetric.  Psychiatric:        Behavior: Behavior normal.        Thought Content: Thought content normal.        Judgment: Judgment normal.    Wash bilateral ears with warm water. Pt not able to tolerate.    BP 112/74   Pulse 91   Temp 97.6 F (36.4 C) (Temporal)   Ht '5\' 6"'$  (1.676 m)   Wt 147 lb (66.7 kg)   BMI 23.73 kg/m      Assessment & Plan:  [name] comes in today with chief complaint of Ear Pain and HEAD STOPPED UP  (OVER A WEEK )   Diagnosis and orders addressed:  1. Sensation of fullness in both ears  - Ambulatory referral to ENT  2. Impacted cerumen  of both ears  - Ambulatory referral to ENT 3. Otalgia of both ears   Debrox drops as needed  Referral to ENT pending  Follow up if symptoms worsen or do not improve     Evelina Dun, FNP

## 2022-05-22 NOTE — Patient Instructions (Signed)

## 2022-05-22 NOTE — Telephone Encounter (Signed)
[place] for [name] this week, I will staff with her, thanks

## 2022-05-22 NOTE — Telephone Encounter (Signed)
Pt called no answer unable to leave a voice mail

## 2022-05-22 NOTE — Telephone Encounter (Signed)
Spoke with pt wife she stated that he has not been taken any medication and has not seen a neurologist in the past 2 years

## 2022-05-25 ENCOUNTER — Encounter: Payer: Medicaid Other | Admitting: Physician Assistant

## 2022-05-25 ENCOUNTER — Encounter: Payer: Self-pay | Admitting: Physician Assistant

## 2022-05-25 NOTE — Progress Notes (Signed)
NO SHOW

## 2022-05-26 DIAGNOSIS — K219 Gastro-esophageal reflux disease without esophagitis: Secondary | ICD-10-CM | POA: Diagnosis not present

## 2022-05-26 DIAGNOSIS — Z7982 Long term (current) use of aspirin: Secondary | ICD-10-CM | POA: Diagnosis not present

## 2022-05-26 DIAGNOSIS — Z7902 Long term (current) use of antithrombotics/antiplatelets: Secondary | ICD-10-CM | POA: Diagnosis not present

## 2022-05-26 DIAGNOSIS — E785 Hyperlipidemia, unspecified: Secondary | ICD-10-CM | POA: Diagnosis not present

## 2022-05-26 DIAGNOSIS — H6123 Impacted cerumen, bilateral: Secondary | ICD-10-CM | POA: Diagnosis not present

## 2022-05-26 DIAGNOSIS — E119 Type 2 diabetes mellitus without complications: Secondary | ICD-10-CM | POA: Diagnosis not present

## 2022-05-26 DIAGNOSIS — I10 Essential (primary) hypertension: Secondary | ICD-10-CM | POA: Diagnosis not present

## 2022-05-26 DIAGNOSIS — Z5329 Procedure and treatment not carried out because of patient's decision for other reasons: Secondary | ICD-10-CM | POA: Diagnosis not present

## 2022-05-31 DIAGNOSIS — H6123 Impacted cerumen, bilateral: Secondary | ICD-10-CM | POA: Diagnosis not present

## 2022-05-31 DIAGNOSIS — H903 Sensorineural hearing loss, bilateral: Secondary | ICD-10-CM | POA: Diagnosis not present

## 2022-06-27 ENCOUNTER — Ambulatory Visit: Payer: Medicaid Other | Admitting: Family Medicine

## 2022-06-27 ENCOUNTER — Encounter: Payer: Self-pay | Admitting: Family Medicine

## 2022-06-27 VITALS — BP 132/86 | HR 91 | Temp 98.5°F | Ht 66.0 in | Wt 143.0 lb

## 2022-06-27 DIAGNOSIS — J0110 Acute frontal sinusitis, unspecified: Secondary | ICD-10-CM | POA: Diagnosis not present

## 2022-06-27 DIAGNOSIS — H6993 Unspecified Eustachian tube disorder, bilateral: Secondary | ICD-10-CM

## 2022-06-27 DIAGNOSIS — R030 Elevated blood-pressure reading, without diagnosis of hypertension: Secondary | ICD-10-CM

## 2022-06-27 DIAGNOSIS — J301 Allergic rhinitis due to pollen: Secondary | ICD-10-CM | POA: Diagnosis not present

## 2022-06-27 MED ORDER — LEVOCETIRIZINE DIHYDROCHLORIDE 5 MG PO TABS: 5.0000 mg | ORAL_TABLET | Freq: Every evening | ORAL | 2 refills | Status: AC

## 2022-06-27 MED ORDER — CEFDINIR 300 MG PO CAPS: 300.0000 mg | ORAL_CAPSULE | Freq: Two times a day (BID) | ORAL | 0 refills | Status: AC

## 2022-06-27 MED ORDER — FLUTICASONE PROPIONATE 50 MCG/ACT NA SUSP: 2.0000 | Freq: Every day | NASAL | 6 refills | Status: AC

## 2022-06-27 NOTE — Progress Notes (Signed)
Acute Office Visit  Subjective:  Patient ID: [name], male    DOB: September 21, 1970, 52 y.o.   MRN: [id number]  Chief Complaint  Patient presents with   head cold    X1 week Cough Congestion Headache Post nasal drip   Patient is in today for URI symptoms  Patient complains of coughing, congestion, headache, and PND.  Started one week ago  Patient tried 300 mg of Advil and that "kind of helped" his symptoms.  Productive cough with white sputum.  Denies sick contacts  Denies fever, ab pain, NVD, sore throat Endorses seasonal allergies. Has been out of his flonase and zyzal   ROS As per HPI Objective:  BP 132/86   Pulse 91   Temp 98.5 F (36.9 C)   Ht 5\' 6"  (1.676 m)   Wt 143 lb (64.9 kg)   SpO2 99%   BMI 23.08 kg/m   Physical Exam Constitutional:      General: He is not in acute distress.    Appearance: Normal appearance. He is not ill-appearing, toxic-appearing or diaphoretic.  HENT:     Right Ear: Ear canal normal. A middle ear effusion is present. Tympanic membrane is injected and erythematous.     Left Ear: Ear canal normal. A middle ear effusion is present. Tympanic membrane is injected and erythematous.     Nose: Rhinorrhea present. Rhinorrhea is clear.     Right Sinus: Frontal sinus tenderness present.     Left Sinus: Frontal sinus tenderness present.     Mouth/Throat:     Mouth: Mucous membranes are moist.     Pharynx: Posterior oropharyngeal erythema present. No pharyngeal swelling or oropharyngeal exudate.     Tonsils: No tonsillar exudate or tonsillar abscesses.  Cardiovascular:     Rate and Rhythm: Normal rate.     Pulses: Normal pulses.     Heart sounds: Normal heart sounds. No murmur heard.    No gallop.  Pulmonary:     Effort: Pulmonary effort is normal. No respiratory distress.     Breath sounds: Normal breath sounds. No stridor. No wheezing, rhonchi or rales.  Skin:    General: Skin is warm.     Capillary Refill: Capillary refill takes less  than 2 seconds.  Neurological:     General: No focal deficit present.     Mental Status: He is alert and oriented to person, place, and time. Mental status is at baseline.     Motor: No weakness.  Psychiatric:        Mood and Affect: Mood normal.        Speech: Speech is delayed.        Behavior: Behavior normal.        Thought Content: Thought content normal.        Judgment: Judgment normal.    Assessment & Plan:  1. Acute non-recurrent frontal sinusitis Antibiotic as below. Discussed with patient allergic reaction to penicillin and recent use of doxycycline led to choice of cefdinir. Discussed with patient to call if he has a reaction to cefdinir. Given that patient has other co-morbid conditions wanted to start treatment for sinusitis rather than continue to watchfully wait.  - cefdinir (OMNICEF) 300 MG capsule; Take 1 capsule (300 mg total) by mouth 2 (two) times daily for 7 days.  Dispense: 14 capsule; Refill: 0  2. Seasonal allergic rhinitis due to pollen 3. Eustachian tube dysfunction, bilateral Medications refilled as below.  - levocetirizine (XYZAL) 5 MG tablet; Take 1  tablet (5 mg total) by mouth every evening.  Dispense: 30 tablet; Refill: 2 - fluticasone (FLONASE) 50 MCG/ACT nasal spray; Place 2 sprays into both nostrils daily.  Dispense: 16 g; Refill: 6  4. Elevated blood pressure reading Discussed with patient monitoring BP at home. Has Cardiology Follow up in May. Discussed with patient to follow up closely with PCP for Chronic condition follow up.   The above assessment and management plan was discussed with the patient. The patient verbalized understanding of and has agreed to the management plan using shared-decision making. Patient is aware to call the clinic if they develop any new symptoms or if symptoms fail to improve or worsen. Patient is aware when to return to the clinic for a follow-up visit. Patient educated on when it is appropriate to go to the emergency  department.   Instructed patient to follow up in 1 month with PCP for Chronic Condition Follow UP  Donzetta Kohut, DNP-FNP [place] [address] [place], [place] [address] [phone]

## 2022-07-07 ENCOUNTER — Other Ambulatory Visit: Payer: Self-pay | Admitting: Family Medicine

## 2022-08-12 NOTE — Progress Notes (Deleted)
Cardiology Office Note:   Date:  08/12/2022  ID:  [name], DOB [birth year], MRN [id number]  History of Present Illness:   [name] is a 52 y.o. male  who presents with a hx of coronary disease.  He had an LAD BMS in 2010.  He had in-stent restenosis in 2018 treated with PCI.  Catheterization in January 2021 showed a patent LAD stent with an 80% distal LAD.  He had a 70% proximal RCA and an 85% mid RCA, the RCA was treated with PCI and DES.  The patient also has several other medical issues.  He has insulin-dependent diabetes.  He has treated dyslipidemia.  He has chronic renal insufficiency 3 with a GFR 44.  There is a history of seizures.   His last ejection fraction was 45% by echocardiogram May 2021.  In the past he has had problems with hyperkalemia.  He was on Entresto at one point and this was stopped and he was restarted on Losartan.  He was having increased chest pain.  I sent him for a cath and he had disease as below. He was managed medically.   Of note his ejection fraction was said to be 25%.  His end-diastolic pressure was low.  ***   ***  At the last visit I titrated his ARB.  I think he is compliant with his medications.  His blood pressure is still elevated.  He says he is taking nitroglycerin a couple times a day but then he says he has not had to replace his nitroglycerin bottle.  He says his pain is the same as when we cathed him last year.  However, at the same time he says he is taking more nitroglycerin which he cannot rectify with the fact that he has not had to have renewals and has not gone through many bottles.   He says he walks for exercise routinely and he does well with this.  He is not having any palpitations, presyncope or syncope.  He had no weight gain or edema.  ROS: ***  Studies Reviewed:    EKG:  ***  ***  Risk Assessment/Calculations:   {Does this patient have ATRIAL FIBRILLATION?:[phone]} No BP recorded.  {Refresh Note OR Click here to enter BP   :1}***        Physical Exam:   VS:  There were no vitals taken for this visit.   Wt Readings from Last 3 Encounters:  06/27/22 143 lb (64.9 kg)  05/22/22 147 lb (66.7 kg)  05/09/22 149 lb (67.6 kg)     GEN: Well nourished, well developed in no acute distress NECK: No JVD; No carotid bruits CARDIAC: ***RRR, no murmurs, rubs, gallops RESPIRATORY:  Clear to auscultation without rales, wheezing or rhonchi  ABDOMEN: Soft, non-tender, non-distended EXTREMITIES:  No edema; No deformity   ASSESSMENT AND PLAN:   CAD :   ***  He says he is having increased chest pain and using lots of nitroglycerin but then he says it is the same as his symptoms will be Him last year and we checked with the pharmacy that he picked up his pills in November and the only had 25 at that time and he is still using the same bottle.I am going to go ahead and increase his Imdur to 120 mg daily.     Ischemic cardiomyopathy:  ***   He has not wanted to take Entresto.  He agreed to the Benicar.   Type 2 diabetes mellitus: A1c was ***  up to 8.8.  I am sending a note to his primary to see if she would like to refer him to endocrinology.    HTN:   ***  This is elevated and I am going to be sending him a blood pressure cuff to keep an eye on this.  Again med titration is difficult with his renal insufficiency and his intolerance of medications in the past.  I am a little bit leery of his compliance.    Dyslipidemia:    ***  HDL was improved after switching his statin.  His last LDL was 70.  No change in therapy.       {Are you ordering a CV Procedure (e.g. stress test, cath, DCCV, TEE, etc)?   Press F2        :[id number]}   Signed, Rollene Rotunda, MD

## 2022-08-15 ENCOUNTER — Ambulatory Visit: Payer: Medicaid Other | Admitting: Cardiology

## 2022-08-15 DIAGNOSIS — I2510 Atherosclerotic heart disease of native coronary artery without angina pectoris: Secondary | ICD-10-CM

## 2022-08-15 DIAGNOSIS — I10 Essential (primary) hypertension: Secondary | ICD-10-CM

## 2022-08-15 DIAGNOSIS — E785 Hyperlipidemia, unspecified: Secondary | ICD-10-CM

## 2022-08-15 DIAGNOSIS — I255 Ischemic cardiomyopathy: Secondary | ICD-10-CM

## 2022-10-31 ENCOUNTER — Encounter: Payer: Medicaid Other | Admitting: Family Medicine

## 2022-11-06 NOTE — Progress Notes (Unsigned)
Cardiology Office Note:   Date:  11/07/2022  ID:  [name], DOB 26-Aug-1970, MRN [id number] PCP: Gabriel Earing, FNP  [facility] Providers Cardiologist:  Rollene Rotunda, MD {  History of Present Illness:   [name] is a 52 y.o. male  who presents with a hx of coronary disease.  He had an LAD BMS in 2010.  He had in-stent restenosis in 2018 treated with PCI.  Catheterization in January 2021 showed a patent LAD stent with an 80% distal LAD.  He had a 70% proximal RCA and an 85% mid RCA, the RCA was treated with PCI and DES.  The patient also has several other medical issues.  He has insulin-dependent diabetes.  He has treated dyslipidemia.  He has chronic renal insufficiency 3 with a GFR 44.  There is a history of seizures.   His last ejection fraction was 45% by echocardiogram May 2021.  In the past he has had problems with hyperkalemia.  He was on Entresto at one point and this was stopped and he was restarted on Losartan.  He was having increased chest pain.  I sent him for a cath and he had disease as below. He was managed medically.   Of note his ejection fraction was said to be 25%.  His end-diastolic pressure was low.  At the last visit I had titrated his Imdur because he was taking a lot of nitroglycerin.  However, he says since I last saw him he stopped taking that.  Made him feel bad.  He says he has not required any nitroglycerin in several months.  He says he is walking daily.  He denies any chest pressure, neck or arm discomfort.  He is not having any new shortness of breath, PND or orthopnea.  He had no weight gain or edema.  ROS: As stated in the HPI and negative for all other systems.  Studies Reviewed:    EKG:     Risk Assessment/Calculations:       Physical Exam:   VS:  BP (!) 160/100   Pulse 96   Ht 5\' 8"  (1.727 m)   Wt 146 lb (66.2 kg)   BMI 22.20 kg/m    Wt Readings from Last 3 Encounters:  11/07/22 146 lb (66.2 kg)  06/27/22 143 lb (64.9 kg)   05/22/22 147 lb (66.7 kg)     GEN: Well nourished, well developed in no acute distress NECK: No JVD; No carotid bruits CARDIAC: RRR, no murmurs, rubs, gallops RESPIRATORY:  Clear to auscultation without rales, wheezing or rhonchi  ABDOMEN: Soft, non-tender, non-distended EXTREMITIES:  No edema; No deformity   ASSESSMENT AND PLAN:   CAD : The patient has no new sypmtoms.  No further cardiovascular testing is indicated.  We will continue with aggressive risk reduction and meds as listed.     Ischemic cardiomyopathy:   He has not wanted to take Entresto.  He called the pharmacy today to verify whether he was taking his medications because his blood pressure is elevated.  Turns out one of his pharmacy is closed.  We could not figure out whether he was actually getting his medications or taking them.  He called his wife.  She says he occasionally takes his medications but not consistently.  He is not even clear that he has had them all filled.  We rewrote the prescriptions and I therefore have not changed any of the doses because I think his issue is really medical nonadherence rather  than needing further med titration.   Type 2 diabetes mellitus: A1c was 8.8 the last time I see it checked.  I am going to send him tomorrow for blood work.  HTN:   We are going to be managing his heart failure in the context of treating his cardiomyopathy  Dyslipidemia:    LDL was 70 in September last year and send him for a lipid profile.        (Greater than 40 minutes reviewing all data with greater than 50% face to face with the patient).  Follow up with me in six months.    Signed, Rollene Rotunda, MD

## 2022-11-07 ENCOUNTER — Encounter: Payer: Self-pay | Admitting: Cardiology

## 2022-11-07 ENCOUNTER — Other Ambulatory Visit: Payer: Self-pay | Admitting: *Deleted

## 2022-11-07 ENCOUNTER — Ambulatory Visit (INDEPENDENT_AMBULATORY_CARE_PROVIDER_SITE_OTHER): Payer: Medicaid Other | Admitting: Cardiology

## 2022-11-07 VITALS — BP 160/100 | HR 96 | Ht 68.0 in | Wt 146.0 lb

## 2022-11-07 DIAGNOSIS — Z9861 Coronary angioplasty status: Secondary | ICD-10-CM

## 2022-11-07 DIAGNOSIS — Z79899 Other long term (current) drug therapy: Secondary | ICD-10-CM | POA: Diagnosis not present

## 2022-11-07 DIAGNOSIS — I2510 Atherosclerotic heart disease of native coronary artery without angina pectoris: Secondary | ICD-10-CM

## 2022-11-07 DIAGNOSIS — E785 Hyperlipidemia, unspecified: Secondary | ICD-10-CM | POA: Diagnosis not present

## 2022-11-07 DIAGNOSIS — E118 Type 2 diabetes mellitus with unspecified complications: Secondary | ICD-10-CM

## 2022-11-07 DIAGNOSIS — I10 Essential (primary) hypertension: Secondary | ICD-10-CM

## 2022-11-07 DIAGNOSIS — I255 Ischemic cardiomyopathy: Secondary | ICD-10-CM | POA: Diagnosis not present

## 2022-11-07 DIAGNOSIS — Z794 Long term (current) use of insulin: Secondary | ICD-10-CM | POA: Diagnosis not present

## 2022-11-07 MED ORDER — OLMESARTAN MEDOXOMIL 40 MG PO TABS: 40.0000 mg | ORAL_TABLET | Freq: Every day | ORAL | 3 refills | Status: AC

## 2022-11-07 MED ORDER — CARVEDILOL 12.5 MG PO TABS: 12.5000 mg | ORAL_TABLET | Freq: Two times a day (BID) | ORAL | 3 refills | Status: DC

## 2022-11-07 MED ORDER — ROSUVASTATIN CALCIUM 40 MG PO TABS: 40.0000 mg | ORAL_TABLET | Freq: Every day | ORAL | 3 refills | Status: DC

## 2022-11-07 NOTE — Patient Instructions (Addendum)
Medication Instructions:  Please make sure your are taking Carvedilol 12.5 mg twice a day. Continue all other medications as listed.  *If you need a refill on your cardiac medications before your next appointment, please call your pharmacy*   Lab Work: Please have blood work at your closest [employer] (Lipid, CMP, CBC and Ha1c)  If you have labs (blood work) drawn today and your tests are completely normal, you will receive your results only by: MyChart Message (if you have MyChart) OR A paper copy in the mail If you have any lab test that is abnormal or we need to change your treatment, we will call you to review the results.  Follow-Up: At [facility], you and your health needs are our priority.  As part of our continuing mission to provide you with exceptional heart care, we have created designated Provider Care Teams.  These Care Teams include your primary Cardiologist (physician) and Advanced Practice Providers (APPs -  Physician Assistants and Nurse Practitioners) who all work together to provide you with the care you need, when you need it.  We recommend signing up for the patient portal called "MyChart".  Sign up information is provided on this After Visit Summary.  MyChart is used to connect with patients for Virtual Visits (Telemedicine).  Patients are able to view lab/test results, encounter notes, upcoming appointments, etc.  Non-urgent messages can be sent to your provider as well.   To learn more about what you can do with MyChart, go to [email or web address].    Your next appointment:   6 month(s)  Provider:   Rollene Rotunda, MD     Please bring your medications, in their bottles, to each appointment!!

## 2022-11-08 ENCOUNTER — Other Ambulatory Visit: Payer: Medicaid Other

## 2022-11-08 DIAGNOSIS — Z79899 Other long term (current) drug therapy: Secondary | ICD-10-CM | POA: Diagnosis not present

## 2022-11-08 DIAGNOSIS — E785 Hyperlipidemia, unspecified: Secondary | ICD-10-CM | POA: Diagnosis not present

## 2022-11-08 DIAGNOSIS — I10 Essential (primary) hypertension: Secondary | ICD-10-CM | POA: Diagnosis not present

## 2022-11-08 DIAGNOSIS — I255 Ischemic cardiomyopathy: Secondary | ICD-10-CM | POA: Diagnosis not present

## 2022-11-09 LAB — CBC
Hematocrit: 45.5 % (ref 37.5–51.0)
Hemoglobin: 15.1 g/dL (ref 13.0–17.7)
MCH: 28.1 pg (ref 26.6–33.0)
MCHC: 33.2 g/dL (ref 31.5–35.7)
MCV: 85 fL (ref 79–97)
Platelets: 250 10*3/uL (ref 150–450)
RBC: 5.38 x10E6/uL (ref 4.14–5.80)
RDW: 13.2 % (ref 11.6–15.4)
WBC: 8.6 10*3/uL (ref 3.4–10.8)

## 2022-11-09 LAB — LIPID PANEL
Chol/HDL Ratio: 4.3 ratio (ref 0.0–5.0)
Cholesterol, Total: 192 mg/dL (ref 100–199)
HDL: 45 mg/dL (ref >39)
LDL Chol Calc (NIH): 124 mg/dL — ABNORMAL HIGH (ref 0–99)
Triglycerides: 127 mg/dL (ref 0–149)
VLDL Cholesterol Cal: 23 mg/dL (ref 5–40)

## 2022-11-09 LAB — COMPREHENSIVE METABOLIC PANEL
ALT: 15 IU/L (ref 0–44)
AST: 14 IU/L (ref 0–40)
Albumin: 4.5 g/dL (ref 3.8–4.9)
Alkaline Phosphatase: 93 IU/L (ref 44–121)
BUN/Creatinine Ratio: 9 (ref 9–20)
BUN: 17 mg/dL (ref 6–24)
Bilirubin Total: 0.5 mg/dL (ref 0.0–1.2)
CO2: 23 mmol/L (ref 20–29)
Calcium: 9.7 mg/dL (ref 8.7–10.2)
Chloride: 102 mmol/L (ref 96–106)
Creatinine, Ser: 1.91 mg/dL — ABNORMAL HIGH (ref 0.76–1.27)
Globulin, Total: 2.7 g/dL (ref 1.5–4.5)
Glucose: 146 mg/dL — ABNORMAL HIGH (ref 70–99)
Potassium: 4.8 mmol/L (ref 3.5–5.2)
Sodium: 138 mmol/L (ref 134–144)
Total Protein: 7.2 g/dL (ref 6.0–8.5)
eGFR: 42 mL/min/{1.73_m2} — ABNORMAL LOW (ref >59)

## 2022-11-09 LAB — HEMOGLOBIN A1C
Est. average glucose Bld gHb Est-mCnc: 180 mg/dL
Hgb A1c MFr Bld: 7.9 % — ABNORMAL HIGH (ref 4.8–5.6)

## 2022-11-12 DIAGNOSIS — Z79899 Other long term (current) drug therapy: Secondary | ICD-10-CM | POA: Diagnosis not present

## 2022-11-12 DIAGNOSIS — E119 Type 2 diabetes mellitus without complications: Secondary | ICD-10-CM | POA: Diagnosis not present

## 2022-11-12 DIAGNOSIS — R079 Chest pain, unspecified: Secondary | ICD-10-CM | POA: Diagnosis not present

## 2022-11-12 DIAGNOSIS — Z794 Long term (current) use of insulin: Secondary | ICD-10-CM | POA: Diagnosis not present

## 2022-11-12 DIAGNOSIS — Z7982 Long term (current) use of aspirin: Secondary | ICD-10-CM | POA: Diagnosis not present

## 2022-11-12 DIAGNOSIS — R55 Syncope and collapse: Secondary | ICD-10-CM | POA: Diagnosis not present

## 2022-11-12 DIAGNOSIS — I959 Hypotension, unspecified: Secondary | ICD-10-CM | POA: Diagnosis not present

## 2022-11-12 DIAGNOSIS — I10 Essential (primary) hypertension: Secondary | ICD-10-CM | POA: Diagnosis not present

## 2022-11-12 DIAGNOSIS — R569 Unspecified convulsions: Secondary | ICD-10-CM | POA: Diagnosis not present

## 2022-11-12 DIAGNOSIS — Z7902 Long term (current) use of antithrombotics/antiplatelets: Secondary | ICD-10-CM | POA: Diagnosis not present

## 2022-11-12 DIAGNOSIS — R0989 Other specified symptoms and signs involving the circulatory and respiratory systems: Secondary | ICD-10-CM | POA: Diagnosis not present

## 2022-11-12 DIAGNOSIS — G40909 Epilepsy, unspecified, not intractable, without status epilepticus: Secondary | ICD-10-CM | POA: Diagnosis not present

## 2022-11-12 DIAGNOSIS — R9431 Abnormal electrocardiogram [ECG] [EKG]: Secondary | ICD-10-CM | POA: Diagnosis not present

## 2022-11-12 DIAGNOSIS — I2510 Atherosclerotic heart disease of native coronary artery without angina pectoris: Secondary | ICD-10-CM | POA: Diagnosis not present

## 2022-11-12 DIAGNOSIS — E785 Hyperlipidemia, unspecified: Secondary | ICD-10-CM | POA: Diagnosis not present

## 2022-11-13 DIAGNOSIS — R569 Unspecified convulsions: Secondary | ICD-10-CM | POA: Diagnosis not present

## 2022-11-13 DIAGNOSIS — R918 Other nonspecific abnormal finding of lung field: Secondary | ICD-10-CM | POA: Diagnosis not present

## 2022-11-13 DIAGNOSIS — Z79899 Other long term (current) drug therapy: Secondary | ICD-10-CM | POA: Diagnosis not present

## 2022-11-13 DIAGNOSIS — G9340 Encephalopathy, unspecified: Secondary | ICD-10-CM | POA: Diagnosis not present

## 2022-11-13 DIAGNOSIS — G40909 Epilepsy, unspecified, not intractable, without status epilepticus: Secondary | ICD-10-CM | POA: Diagnosis not present

## 2022-11-13 DIAGNOSIS — I509 Heart failure, unspecified: Secondary | ICD-10-CM | POA: Diagnosis not present

## 2022-11-13 DIAGNOSIS — I10 Essential (primary) hypertension: Secondary | ICD-10-CM | POA: Diagnosis not present

## 2022-11-13 DIAGNOSIS — E785 Hyperlipidemia, unspecified: Secondary | ICD-10-CM | POA: Diagnosis not present

## 2022-11-13 DIAGNOSIS — I130 Hypertensive heart and chronic kidney disease with heart failure and stage 1 through stage 4 chronic kidney disease, or unspecified chronic kidney disease: Secondary | ICD-10-CM | POA: Diagnosis not present

## 2022-11-13 DIAGNOSIS — I340 Nonrheumatic mitral (valve) insufficiency: Secondary | ICD-10-CM | POA: Diagnosis not present

## 2022-11-13 DIAGNOSIS — R9431 Abnormal electrocardiogram [ECG] [EKG]: Secondary | ICD-10-CM | POA: Diagnosis not present

## 2022-11-13 DIAGNOSIS — Z955 Presence of coronary angioplasty implant and graft: Secondary | ICD-10-CM | POA: Diagnosis not present

## 2022-11-13 DIAGNOSIS — Z7902 Long term (current) use of antithrombotics/antiplatelets: Secondary | ICD-10-CM | POA: Diagnosis not present

## 2022-11-13 DIAGNOSIS — I5023 Acute on chronic systolic (congestive) heart failure: Secondary | ICD-10-CM | POA: Diagnosis not present

## 2022-11-13 DIAGNOSIS — Z7982 Long term (current) use of aspirin: Secondary | ICD-10-CM | POA: Diagnosis not present

## 2022-11-13 DIAGNOSIS — I2510 Atherosclerotic heart disease of native coronary artery without angina pectoris: Secondary | ICD-10-CM | POA: Diagnosis not present

## 2022-11-13 DIAGNOSIS — E1122 Type 2 diabetes mellitus with diabetic chronic kidney disease: Secondary | ICD-10-CM | POA: Diagnosis not present

## 2022-11-13 DIAGNOSIS — Z794 Long term (current) use of insulin: Secondary | ICD-10-CM | POA: Diagnosis not present

## 2022-11-13 DIAGNOSIS — N183 Chronic kidney disease, stage 3 unspecified: Secondary | ICD-10-CM | POA: Diagnosis not present

## 2022-11-13 DIAGNOSIS — R579 Shock, unspecified: Secondary | ICD-10-CM | POA: Diagnosis not present

## 2022-11-13 DIAGNOSIS — R55 Syncope and collapse: Secondary | ICD-10-CM | POA: Diagnosis not present

## 2022-11-13 DIAGNOSIS — I959 Hypotension, unspecified: Secondary | ICD-10-CM | POA: Diagnosis not present

## 2022-11-13 DIAGNOSIS — E119 Type 2 diabetes mellitus without complications: Secondary | ICD-10-CM | POA: Diagnosis not present

## 2022-11-14 DIAGNOSIS — R579 Shock, unspecified: Secondary | ICD-10-CM | POA: Diagnosis not present

## 2022-11-14 DIAGNOSIS — I5020 Unspecified systolic (congestive) heart failure: Secondary | ICD-10-CM | POA: Diagnosis not present

## 2022-11-14 DIAGNOSIS — I10 Essential (primary) hypertension: Secondary | ICD-10-CM | POA: Diagnosis not present

## 2022-11-14 DIAGNOSIS — R569 Unspecified convulsions: Secondary | ICD-10-CM | POA: Diagnosis not present

## 2022-11-14 DIAGNOSIS — N189 Chronic kidney disease, unspecified: Secondary | ICD-10-CM | POA: Diagnosis not present

## 2022-11-15 ENCOUNTER — Telehealth: Payer: Self-pay | Admitting: Cardiology

## 2022-11-15 DIAGNOSIS — N189 Chronic kidney disease, unspecified: Secondary | ICD-10-CM | POA: Diagnosis not present

## 2022-11-15 DIAGNOSIS — R579 Shock, unspecified: Secondary | ICD-10-CM | POA: Diagnosis not present

## 2022-11-15 DIAGNOSIS — I10 Essential (primary) hypertension: Secondary | ICD-10-CM | POA: Diagnosis not present

## 2022-11-15 DIAGNOSIS — I5020 Unspecified systolic (congestive) heart failure: Secondary | ICD-10-CM | POA: Diagnosis not present

## 2022-11-15 DIAGNOSIS — R569 Unspecified convulsions: Secondary | ICD-10-CM | POA: Diagnosis not present

## 2022-11-15 NOTE — Telephone Encounter (Signed)
Pt's spouse is requesting a callback regarding pt currently being in Hospital at [place].

## 2022-11-15 NOTE — Telephone Encounter (Signed)
States call cannot be completed at this time. Called back and goes to VM.  VM is full and unable to LM

## 2022-11-16 ENCOUNTER — Telehealth: Payer: Self-pay

## 2022-11-16 NOTE — Transitions of Care (Post Inpatient/ED Visit) (Signed)
   11/16/2022  Name: [name] MRN: [id number] DOB: Jun 14, 1970  Today's TOC FU Call Status: Today's TOC FU Call Status:: Unsuccessful Call (1st Attempt) Unsuccessful Call (1st Attempt) Date: 11/16/22  Attempted to reach the patient regarding the most recent Inpatient/ED visit.  Follow Up Plan: Additional outreach attempts will be made to reach the patient to complete the Transitions of Care (Post Inpatient/ED visit) call.    Alyse Low, RN, BA, [facility], CRRN [facility] Coordinator, Transition of Care Ph # [phone]

## 2022-11-16 NOTE — Telephone Encounter (Signed)
Pt's spouse is requesting a callback regarding pt being in hospital yesterday. Please advise

## 2022-11-16 NOTE — Telephone Encounter (Signed)
Called pt. No answer and no VM.

## 2022-11-16 NOTE — Telephone Encounter (Signed)
Returned call to pt's wife. Pt was in the hospital on Monday night through Thursday. He had a seizure and he quit breathing. He was told by the hospital doctor that he is taking too much of his BP medication and to ask Dr. Antoine Poche will change his meds. Please advise

## 2022-11-16 NOTE — Telephone Encounter (Signed)
Returned call to pt.

## 2022-11-16 NOTE — Telephone Encounter (Signed)
Unable to reach pt or leave a message to confirm medications currently taking.

## 2022-11-17 ENCOUNTER — Telehealth: Payer: Self-pay | Admitting: Physician Assistant

## 2022-11-17 NOTE — Telephone Encounter (Signed)
Patient's son and patient wife called because he had been in the hospital recently and they stopped his heart medication.  They were concerned about him being off his heart medication and wondered what to do.  He was admitted for syncope and seizure in the setting of hypotension.  He had been discharged on the olmesartan 40 mg, decrease that to 20 mg daily.  He had been on Keppra years ago for seizures and that has been restarted.  No seizure activity since discharge.  They are going to check his blood pressure daily and keep a blood pressure log.  They will call back if SBP is greater than 150.  I will send a message to the office to get him in early follow-up appointment either in the [place] office or in the [place] office.  Any additional symptoms, they are to call back.  Theodore Demark, PA-C 11/17/2022 12:39 PM

## 2022-11-19 NOTE — Telephone Encounter (Signed)
Called patient left message on personal voice mail to call back. 

## 2022-11-20 ENCOUNTER — Telehealth: Payer: Self-pay

## 2022-11-20 NOTE — Transitions of Care (Post Inpatient/ED Visit) (Signed)
   11/20/2022  Name: [name] MRN: [id number] DOB: 1970-08-17  Today's TOC FU Call Status: Today's TOC FU Call Status:: Successful TOC FU Call Completed TOC FU Call Complete Date: 11/20/22 Patient's Name and Date of Birth confirmed.  Transition Care Management Follow-up Telephone Call Date of Discharge: 11/15/22 Discharge Facility: Other [employer]) Name of Other (Non[facility]) Discharge Facility: [facility], [place] Type of Discharge: Inpatient Admission Primary Inpatient Discharge Diagnosis:: syncope, witnessed seizure-like activity How have you been since you were released from the hospital?: Better Any questions or concerns?: No  Items Reviewed: Did you receive and understand the discharge instructions provided?: Yes  Medications Reviewed Today: Medications Reviewed Today   Medications were not reviewed in this encounter     Home Care and Equipment/Supplies:    Functional Questionnaire: Do you need assistance with bathing/showering or dressing?: No Do you need assistance with meal preparation?: No Do you need assistance with eating?: No Do you have difficulty maintaining continence: No Do you need assistance with getting out of bed/getting out of a chair/moving?: No Do you have difficulty managing or taking your medications?: No  Follow up appointments reviewed: PCP Follow-up appointment confirmed?: Yes Date of PCP follow-up appointment?:  (Pt states he has made all necessary follow up appointments including one with his PCP, FNP Harlow Mares) Follow-up Provider: FNP T[facility] Follow-up appointment confirmed?: Yes Follow-Up Specialty Provider:: Cardiology - went home with holter monitor    Alyse Low, RN, BA, [facility], CRRN [facility] Care Management Coordinator, Transition of Care Ph # [phone]

## 2022-11-23 NOTE — Telephone Encounter (Signed)
Called patient left message on personal voice mail Dr.Hochrein reviewed hospital notes.He advised ok to stay off coreg and imdur.Advised to keep appointment with Micah Flesher PA 9/30 at 3:35 pm.

## 2022-11-23 NOTE — Addendum Note (Signed)
Addended by: Neoma Laming on: 11/23/2022 01:42 PM   Modules accepted: Orders

## 2022-11-24 ENCOUNTER — Other Ambulatory Visit: Payer: Self-pay | Admitting: Family Medicine

## 2022-11-30 ENCOUNTER — Inpatient Hospital Stay: Payer: Medicaid Other | Admitting: Family Medicine

## 2022-12-03 ENCOUNTER — Other Ambulatory Visit: Payer: Self-pay | Admitting: Family Medicine

## 2022-12-03 NOTE — Telephone Encounter (Signed)
Follow up scheduled

## 2022-12-06 ENCOUNTER — Inpatient Hospital Stay: Payer: Medicaid Other

## 2022-12-07 ENCOUNTER — Telehealth: Payer: Self-pay | Admitting: Cardiology

## 2022-12-07 ENCOUNTER — Ambulatory Visit: Payer: Medicaid Other | Admitting: Family Medicine

## 2022-12-07 ENCOUNTER — Ambulatory Visit: Payer: Medicaid Other

## 2022-12-07 ENCOUNTER — Encounter: Payer: Self-pay | Admitting: Family Medicine

## 2022-12-07 VITALS — BP 160/100 | HR 85 | Temp 98.5°F | Ht 68.0 in | Wt 144.2 lb

## 2022-12-07 DIAGNOSIS — R569 Unspecified convulsions: Secondary | ICD-10-CM | POA: Diagnosis not present

## 2022-12-07 DIAGNOSIS — E785 Hyperlipidemia, unspecified: Secondary | ICD-10-CM | POA: Diagnosis not present

## 2022-12-07 DIAGNOSIS — I2510 Atherosclerotic heart disease of native coronary artery without angina pectoris: Secondary | ICD-10-CM | POA: Diagnosis not present

## 2022-12-07 DIAGNOSIS — Z794 Long term (current) use of insulin: Secondary | ICD-10-CM | POA: Diagnosis not present

## 2022-12-07 DIAGNOSIS — I509 Heart failure, unspecified: Secondary | ICD-10-CM | POA: Diagnosis not present

## 2022-12-07 DIAGNOSIS — I152 Hypertension secondary to endocrine disorders: Secondary | ICD-10-CM | POA: Diagnosis not present

## 2022-12-07 DIAGNOSIS — F419 Anxiety disorder, unspecified: Secondary | ICD-10-CM

## 2022-12-07 DIAGNOSIS — F331 Major depressive disorder, recurrent, moderate: Secondary | ICD-10-CM | POA: Diagnosis not present

## 2022-12-07 DIAGNOSIS — E1169 Type 2 diabetes mellitus with other specified complication: Secondary | ICD-10-CM | POA: Diagnosis not present

## 2022-12-07 DIAGNOSIS — Z09 Encounter for follow-up examination after completed treatment for conditions other than malignant neoplasm: Secondary | ICD-10-CM

## 2022-12-07 DIAGNOSIS — E1165 Type 2 diabetes mellitus with hyperglycemia: Secondary | ICD-10-CM | POA: Diagnosis not present

## 2022-12-07 DIAGNOSIS — E1159 Type 2 diabetes mellitus with other circulatory complications: Secondary | ICD-10-CM

## 2022-12-07 DIAGNOSIS — N183 Chronic kidney disease, stage 3 unspecified: Secondary | ICD-10-CM

## 2022-12-07 DIAGNOSIS — Z9861 Coronary angioplasty status: Secondary | ICD-10-CM | POA: Diagnosis not present

## 2022-12-07 DIAGNOSIS — Z91199 Patient's noncompliance with other medical treatment and regimen due to unspecified reason: Secondary | ICD-10-CM | POA: Insufficient documentation

## 2022-12-07 HISTORY — DX: Heart failure, unspecified: I50.9

## 2022-12-07 MED ORDER — CITALOPRAM HYDROBROMIDE 10 MG PO TABS
10.0000 mg | ORAL_TABLET | Freq: Every day | ORAL | 1 refills | Status: AC
Start: 2022-12-07 — End: ?

## 2022-12-07 NOTE — Patient Instructions (Signed)
[facility] [address] [place],  [place]  [address] Get Driving Directions Main: [phone]  Fax: [phone]

## 2022-12-07 NOTE — Telephone Encounter (Signed)
Verbal permission to speak with son.  Son states the patient PCP saw him and wants him back on the Rosuvastatin and and Imdur. Son states he was told not to go back on these until sees Dr Antoine Poche.  States had seizure spells when on medication.[name]  He goes into a lot of details about issues.  Advised to continue to hold as he was told .  Appt moved up by one week.  He will discuss all issues with provider

## 2022-12-07 NOTE — Telephone Encounter (Signed)
Pt c/o medication issue:  1. Name of Medication:   rosuvastatin (CRESTOR) 40 MG tablet  2. How are you currently taking this medication (dosage and times per day)?   3. Are you having a reaction (difficulty breathing--STAT)?   4. What is your medication issue?   Patient's son states PCP would like to put patient back on Imdur and rosuvastatin. See 8/24 encounter.

## 2022-12-07 NOTE — Progress Notes (Signed)
Established Patient Office Visit  Subjective   Patient ID: [name], male    DOB: 08/14/1970  Age: 52 y.o. MRN: [id number]  Chief Complaint  Patient presents with   Transitions Of Care    HPI Here with spouse today for hospital follow up.  The patient was discharged from [facility] on 11/15/22 with a primary diagnosis of witnessed seizure like activity.   Per discharge summary:  "Kenyatta Chant is a 52 y.o. male with hx DM, HLD, HTN, CAD s/p stents, CHF who presented to [facility] with seizure like activity x2 witnessed by wife. Events lasted about 30s and associated with LOC. Post-ictal confusion was also noted. He was given 1g of keppra. Pt was noted to be hypotensive and started on levo and given a fluid bolus. Pt was then transferred to [facility] for further care." "Patient with hypotensive episodes requiring brief pressors. Wife stated that patient had not been taking his GDMT for awhile and started taking all of them at once. This was likely the etiology of his hypotension and possible syncope. UDS negative. Cards were consulted. Echo with EF 30-35%, same as prior. No arrhythmias on tele. GDMT held d/t hypotension. Will have him slowly re-introduce his GDMT on discharge. Allow him to start with Losartan and have Coreg & Imdur brought back on by PCP or Cardiology"  He hasn't been taking keppra because this makes him drowsy and dizzy. He has concerns about riding the lawn mower and feeling drowsy and dizzy. He has not called to schedule a follow up with neurology.    He has stopped taking all of his medications since discharge except for aspirin.  He did have chest pain the other night. It was 20-30 seconds long. Denies shortness of breath, edema, orthopnea, visual disturbances, focal weakness. Has appt for cardiology on 12/24/22.  He has been checking his blood sugar once a day or so. Blood sugars have been 130-200 at random checks. He has not been taking insulin or any of his diabetic  medications.  He has been off of celexa for a long time. He has been anxious. Not so much depressed. Would like to restart this.   Past Medical History:  Diagnosis Date   Alcohol abuse    CAD (coronary artery disease), native coronary artery    a. 01/2009 s/p Anterior MI and thrombectomy with BMS to mid LAD;  b. 06/2016 Cath/PCI: LM nl, LAD 79m/d, LCX nl, OM1/2/3 nl, RDA 52m. Initial attempt @ PCI failed followed by successful CTO LAD PCI and DES (2.5x38 Synergy)-->rec for indefinte DAPT.   CKD (chronic kidney disease), stage III (HCC)    Essential hypertension    History of nonadherence to medical treatment    Hyperlipidemia    Ischemic cardiomyopathy    a. EF 40% by nuc in 2012;  b. 06/2016 Echo: EF 40-45%, apicalanteroseptal and apical AK, Gr2 DD, mod Ca2+ Ao annulus, mild MR.   Slurred speech - Chronic    Type 2 diabetes mellitus (HCC)       ROS Negative unless specially indicated above in HPI.    Objective:     BP (!) 160/100   Pulse 85   Temp 98.5 F (36.9 C) (Temporal)   Ht 5\' 8"  (1.727 m)   Wt 144 lb 4 oz (65.4 kg)   SpO2 98%   BMI 21.93 kg/m    Physical Exam Vitals and nursing note reviewed.  Constitutional:      General: He is not in acute distress.  Appearance: Normal appearance. He is not ill-appearing, toxic-appearing or diaphoretic.  HENT:     Head: Normocephalic and atraumatic.     Mouth/Throat:     Mouth: Mucous membranes are moist.     Pharynx: Oropharynx is clear.  Eyes:     Pupils: Pupils are equal, round, and reactive to light.  Cardiovascular:     Rate and Rhythm: Normal rate and regular rhythm.     Heart sounds: Normal heart sounds. No murmur heard. Pulmonary:     Effort: Pulmonary effort is normal. No respiratory distress.     Breath sounds: Normal breath sounds. No wheezing.  Musculoskeletal:     Cervical back: Neck supple. No rigidity.     Right lower leg: No edema.     Left lower leg: No edema.  Skin:    General: Skin is warm and  dry.  Neurological:     Mental Status: He is alert and oriented to person, place, and time. Mental status is at baseline.     Motor: No weakness.  Psychiatric:        Mood and Affect: Mood normal.        Behavior: Behavior normal.      No results found for any visits on 12/07/22.    The ASCVD Risk score (Arnett DK, et al., 2019) failed to calculate for the following reasons:   The valid total cholesterol range is 130 to 320 mg/dL    Assessment & Plan:   Claudius was seen today for transitions of care.  Diagnoses and all orders for this visit:  Witnessed seizure-like activity (HCC) Discussed compliance with keppra. 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 Discussed to call and schedule follow up with neurology for further evaluation and treatment. Discussed that neurology may be able to provide a different treatment option given side effects of keppra, however he needs to continue this for now, as he is high risk for seizure without treatment. Provided contact information for his neurology office.  -     CBC with Differential/Platelet -     CMP14+EGFR  Hypertension associated with diabetes (HCC) Uncontrolled BP. Corge and Imdur were held at discharge, however patient has also stopped taking olmestartan. Discussed need to restart this as uncontrolled HTN increase risk of MI/CVA. -     CBC with Differential/Platelet -     CMP14+EGFR -     TSH  Type 2 diabetes mellitus with hyperglycemia, with long-term current use of insulin (HCC) Uncontrolled. Recent A1c was 7.5. Not compliant with medications. Discussed compliance with insulin regimen. Will discussed oral medications pending labs as he would benefit form oral medication given concurrent CKD and CAD. I did confirm that he has insulin pens at home.  -     CBC with Differential/Platelet -     CMP14+EGFR  Hyperlipidemia associated with type 2 diabetes mellitus (HCC) Noncompliant with statin.  Discussed to restart crestor.  -     CBC with Differential/Platelet -     CMP14+EGFR  Chronic kidney disease (CKD), active medical management without dialysis, stage 3 (moderate) (HCC) Labs pending. Previously on farxiga but stopped. Will discussed again pending labs. Discussed to restarted olmesartan.  -     CBC with Differential/Platelet -     CMP14+EGFR  CAD S/P percutaneous coronary angioplasty Chronic congestive heart failure, unspecified heart failure type (HCC) Echo on admission was stable for patient. Follow up with cardiology as schedule on 12/24/22. Restart olmesartan. Continue to hold coreg  and imdur for now. Restart statin. Continue aspirin.  -     CBC with Differential/Platelet -     CMP14+EGFR  Anxiety Moderate episode of recurrent major depressive disorder (HCC) Will restart celexa at lower dosage.  -     citalopram (CELEXA) 10 MG tablet; Take 1 tablet (10 mg total) by mouth daily.  Noncompliance Noncompliant with medications, follow ups.   Hospital discharge follow-up Reviewed hospital discharge summary, labs.   Total time spent caring for the patient today was 47 minutes. This includes time spent before the visit reviewing the chart, time spent during the visit, and time spent after the visit on documentation.    Return in about 3 months (around 02/23/2023) for chronic follow up. Sooner for new or worsening symptoms.   The patient indicates understanding of these issues and agrees with the plan.   Gabriel Earing, FNP

## 2022-12-07 NOTE — Telephone Encounter (Signed)
*  STAT* If patient is at the pharmacy, call can be transferred to refill team.   1. Which medications need to be refilled? (please list name of each medication and dose if known) nitroGLYCERIN (NITROSTAT) 0.4 MG SL tablet  2. Which pharmacy/location (including street and city if local pharmacy) is medication to be sent to? [facility] - [place], [place] - [address]   3. Do they need a 30 day or 90 day supply?   Standard supply  Patient's son states patient is completely out of medication.

## 2022-12-08 LAB — CBC WITH DIFFERENTIAL/PLATELET
Basophils Absolute: 0 10*3/uL (ref 0.0–0.2)
Basos: 0 %
EOS (ABSOLUTE): 0.1 10*3/uL (ref 0.0–0.4)
Eos: 1 %
Hematocrit: 45.3 % (ref 37.5–51.0)
Hemoglobin: 14.8 g/dL (ref 13.0–17.7)
Immature Grans (Abs): 0 10*3/uL (ref 0.0–0.1)
Immature Granulocytes: 0 %
Lymphocytes Absolute: 2.1 10*3/uL (ref 0.7–3.1)
Lymphs: 24 %
MCH: 27.8 pg (ref 26.6–33.0)
MCHC: 32.7 g/dL (ref 31.5–35.7)
MCV: 85 fL (ref 79–97)
Monocytes Absolute: 0.7 10*3/uL (ref 0.1–0.9)
Monocytes: 8 %
Neutrophils Absolute: 5.9 10*3/uL (ref 1.4–7.0)
Neutrophils: 67 %
Platelets: 227 10*3/uL (ref 150–450)
RBC: 5.32 x10E6/uL (ref 4.14–5.80)
RDW: 13.5 % (ref 11.6–15.4)
WBC: 8.9 10*3/uL (ref 3.4–10.8)

## 2022-12-08 LAB — CMP14+EGFR
ALT: 14 IU/L (ref 0–44)
AST: 18 IU/L (ref 0–40)
Albumin: 4.6 g/dL (ref 3.8–4.9)
Alkaline Phosphatase: 94 IU/L (ref 44–121)
BUN/Creatinine Ratio: 11 (ref 9–20)
BUN: 21 mg/dL (ref 6–24)
Bilirubin Total: 0.5 mg/dL (ref 0.0–1.2)
CO2: 18 mmol/L — ABNORMAL LOW (ref 20–29)
Calcium: 9.1 mg/dL (ref 8.7–10.2)
Chloride: 106 mmol/L (ref 96–106)
Creatinine, Ser: 2 mg/dL — ABNORMAL HIGH (ref 0.76–1.27)
Globulin, Total: 2.5 g/dL (ref 1.5–4.5)
Glucose: 151 mg/dL — ABNORMAL HIGH (ref 70–99)
Potassium: 4.7 mmol/L (ref 3.5–5.2)
Sodium: 141 mmol/L (ref 134–144)
Total Protein: 7.1 g/dL (ref 6.0–8.5)
eGFR: 40 mL/min/{1.73_m2} — ABNORMAL LOW (ref >59)

## 2022-12-08 LAB — TSH: TSH: 3.34 u[IU]/mL (ref 0.450–4.500)

## 2022-12-10 ENCOUNTER — Other Ambulatory Visit: Payer: Self-pay | Admitting: Family Medicine

## 2022-12-10 DIAGNOSIS — N183 Chronic kidney disease, stage 3 unspecified: Secondary | ICD-10-CM

## 2022-12-10 DIAGNOSIS — E1165 Type 2 diabetes mellitus with hyperglycemia: Secondary | ICD-10-CM

## 2022-12-10 DIAGNOSIS — I509 Heart failure, unspecified: Secondary | ICD-10-CM

## 2022-12-10 MED ORDER — NITROGLYCERIN 0.4 MG SL SUBL: 0.4000 mg | SUBLINGUAL_TABLET | SUBLINGUAL | 4 refills | Status: DC | PRN

## 2022-12-10 MED ORDER — EMPAGLIFLOZIN 10 MG PO TABS
10.0000 mg | ORAL_TABLET | Freq: Every day | ORAL | 3 refills | Status: DC
Start: 2022-12-10 — End: 2022-12-17

## 2022-12-10 NOTE — Telephone Encounter (Signed)
Refill for Nitroglycerin has been sent to [facility].

## 2022-12-17 ENCOUNTER — Ambulatory Visit: Payer: Medicaid Other | Attending: Physician Assistant | Admitting: Physician Assistant

## 2022-12-17 ENCOUNTER — Encounter: Payer: Self-pay | Admitting: Physician Assistant

## 2022-12-17 VITALS — BP 142/100 | HR 76 | Ht 69.0 in | Wt 143.0 lb

## 2022-12-17 DIAGNOSIS — Z794 Long term (current) use of insulin: Secondary | ICD-10-CM

## 2022-12-17 DIAGNOSIS — I509 Heart failure, unspecified: Secondary | ICD-10-CM

## 2022-12-17 DIAGNOSIS — I25119 Atherosclerotic heart disease of native coronary artery with unspecified angina pectoris: Secondary | ICD-10-CM | POA: Diagnosis not present

## 2022-12-17 DIAGNOSIS — E1165 Type 2 diabetes mellitus with hyperglycemia: Secondary | ICD-10-CM | POA: Diagnosis not present

## 2022-12-17 DIAGNOSIS — E785 Hyperlipidemia, unspecified: Secondary | ICD-10-CM

## 2022-12-17 DIAGNOSIS — I10 Essential (primary) hypertension: Secondary | ICD-10-CM | POA: Diagnosis not present

## 2022-12-17 DIAGNOSIS — I5022 Chronic systolic (congestive) heart failure: Secondary | ICD-10-CM | POA: Diagnosis not present

## 2022-12-17 DIAGNOSIS — N183 Chronic kidney disease, stage 3 unspecified: Secondary | ICD-10-CM

## 2022-12-17 MED ORDER — EMPAGLIFLOZIN 10 MG PO TABS: 10.0000 mg | ORAL_TABLET | Freq: Every day | ORAL | Status: DC

## 2022-12-17 MED ORDER — ROSUVASTATIN CALCIUM 40 MG PO TABS: 40.0000 mg | ORAL_TABLET | Freq: Every day | ORAL | 3 refills | Status: AC

## 2022-12-17 MED ORDER — CARVEDILOL 3.125 MG PO TABS: 3.1250 mg | ORAL_TABLET | Freq: Two times a day (BID) | ORAL | 3 refills | Status: AC

## 2022-12-17 MED ORDER — EMPAGLIFLOZIN 10 MG PO TABS: 10.0000 mg | ORAL_TABLET | Freq: Every day | ORAL | 3 refills | Status: DC

## 2022-12-17 NOTE — Patient Instructions (Signed)
Medication Instructions:  START JARDIANCE 10 MG DAILY  START ROSUVASTATIN 40 MG DAILY  START CARVEDILOL 3.125 MG TWICE DAILY.  *If you need a refill on your cardiac medications before your next appointment, please call your pharmacy*   Lab Work: NO LABS If you have labs (blood work) drawn today and your tests are completely normal, you will receive your results only by: MyChart Message (if you have MyChart) OR A paper copy in the mail If you have any lab test that is abnormal or we need to change your treatment, we will call you to review the results.   Testing/Procedures: NO TESTING   Follow-Up: At [facility], you and your health needs are our priority.  As part of our continuing mission to provide you with exceptional heart care, we have created designated Provider Care Teams.  These Care Teams include your primary Cardiologist (physician) and Advanced Practice Providers (APPs -  Physician Assistants and Nurse Practitioners) who all work together to provide you with the care you need, when you need it.    Your next appointment:   3 week(s)  Provider:   Azalee Course, PA

## 2022-12-17 NOTE — Progress Notes (Unsigned)
Cardiology Office Note:  .   Date:  12/18/2022  ID:  Washington Washington, DOB 1970/07/19, MRN [id number] PCP: Gabriel Earing, FNP  [facility] Providers Cardiologist:  Rollene Rotunda, MD     History of Present Illness: .   Washington Washington is a 52 y.o. male with PMH of CAD, ischemic cardiomyopathy, CKD stage III, hypertension, hyperlipidemia and DM2.  Patient had BMS to LAD in 2010.  He developed in-stent restenosis in 2018 treated with PCI.  Cardiac catheterization in 2021 showed patent LAD stent with 80% distal LAD lesion, 70% proximal RCA lesion, 85% mid RCA lesion.  RCA lesions treated with DES.  Echocardiogram in May 2021 showed EF 45%.  In the past, he also had problem with hyperkalemia, for this reason, Sherryll Burger was discontinued and he was restarted on losartan.  Last cardiac catheterization performed on 07/06/2021 demonstrated a 30% mid to distal LAD, 80% distal LAD lesion, 75% D1, 90% first septal perforator, patent stent in mid to proximal RCA, EF 25 to 35%.  Medical therapy was recommended.  Although diagonal lesion has worsened, however treatment would require going through the LAD stent struts.  Echocardiogram obtained on 08/08/2021 showed EF 30%, akinesis of the distal anterior wall and the entire apex, akinesis of anteroseptal wall, grade 1 DD, trivial MR.  His Imdur was titrated due to increased nitroglycerin usage.  He was last seen by Dr. Antoine Poche in August 2023, at which time, he has not used Imdur as it made him feel bad.  Since the last visit, patient was seeing at [facility] ED due to syncope and seizure-like activity x 2 witnessed by wife.  Event lasted roughly 30 minutes each.  Postictal confusion was noted.  He was given 1 g of Keppra.  He was subsequently transferred to [facility].  EEG was negative for seizure.  Patient was seen by neurology service who recommended Keppra 500 mg twice daily.  He was hypotensive on arrival.  Family states he has not been taking his GDMT for  a while then started taking all of them at once prior to hospitalization.  GDMT held due to hypotension.  It was recommended he slowly restart GDMT on discharge.  He was restarted on Imdur, carvedilol and Imdur held.  Echocardiogram obtained on 11/13/2022 demonstrated EF 30 to 35%, akinesis of the apical anterior and apical septal and apex location.  Since discharge, patient has been seen by family medicine service on 12/07/2022, he was not taking Keppra at the time as they made him feel drowsy and dizzy.  He also has stopped taking all of his medications since discharge except for aspirin.  Patient presents today accompanied by son.  He says he was very dizzy after taking off his medication once.  Currently the only medication he takes is the aspirin.  Most recent blood work obtained after his recent discharge showed creatinine hovering around 2.0, normal TSH and CBC.  I plan to restart Jardiance and rosuvastatin.  I decided to restart carvedilol at a low dose of 3.125 mg twice a day.  On the next follow-up, depend on renal function, I may be able to restart olmesartan at the low-dose as well.  He can follow-up in 3 weeks for titration of heart failure therapy.  ROS:   He denies chest pain, palpitations, dyspnea, pnd, orthopnea, n, v, dizziness, syncope, edema, weight gain, or early satiety. All other systems reviewed and are otherwise negative except as noted above.    Studies Reviewed: .  EKG Interpretation Date/Time:  Monday December 17 2022 14:52:24 EDT Ventricular Rate:  76 PR Interval:  164 QRS Duration:  94 QT Interval:  366 QTC Calculation: 411 R Axis:   4  Text Interpretation: Normal sinus rhythm Inferior infarct , age undetermined Anterolateral infarct , age undetermined When compared with ECG of 12-Sep-2019 15:10, PREVIOUS ECG IS PRESENT Confirmed by Azalee Course [phone]) on 12/18/2022 6:36:05 PM    Cardiac Studies & Procedures   CARDIAC CATHETERIZATION  CARDIAC CATHETERIZATION  07/06/2021  Narrative   Mid LAD to Dist LAD lesion is 30% stenosed.   Dist LAD lesion is 80% stenosed.   1st Diag lesion is 75% stenosed.   1st Sept lesion is 90% stenosed.   Non-stenotic Mid RCA lesion was previously treated.   Non-stenotic Prox RCA lesion was previously treated.   There is severe left ventricular systolic dysfunction.   LV end diastolic pressure is normal.   The left ventricular ejection fraction is 25-35% by visual estimate.  Continued patency of stents in the proximal to mid LAD and proximal and mid RCA. There is moderate disease in the first diagonal that has progressed from prior. Distal LAD stenosis is stable and the vessel is quite small at this area Severe LV dysfunction. EF is marked reduced 25-30% with apical dyskinesis. Anterior akinesis. Normal LV EDP  Plan: recommend continued medical therapy. The diagonal could be a source of angina but treatment would require going through LAD stent struts. Since this is not critical I would treat medically. His LV function does appear much worse than before.  Findings Coronary Findings Diagnostic  Dominance: Right  Left Main Vessel is large. Vessel is angiographically normal.  Left Anterior Descending There is moderate diffuse disease throughout the vessel. Mid LAD to Dist LAD lesion is 30% stenosed. The lesion was previously treated using a drug eluting stent over 2 years ago. Stent with in-stent April 2018 Previously placed stent displays restenosis. Dist LAD lesion is 80% stenosed. The lesion is focal. Not PCI target At this point, the LAD is less than 2 mm in diameter.  Not PCI target  First Diagonal Branch Vessel is moderate in size. 1st Diag lesion is 75% stenosed.  Lateral First Diagonal Branch Vessel is moderate in size.  First Septal Branch 1st Sept lesion is 90% stenosed.  Right Coronary Artery Vessel is large. Non-stenotic Prox RCA lesion was previously treated. Guide catheter related dissection  noted after initial stent placement Non-stenotic Mid RCA lesion was previously treated. Vessel is the culprit lesion. The lesion is focal. With tapering from either direction making the lesion longer  Acute Marginal Branch Vessel is small in size.  Right Ventricular Branch Vessel is small in size.  First Right Posterolateral Branch Vessel is large in size.  Second Right Posterolateral Branch Vessel is moderate in size.  Intervention  No interventions have been documented.   CARDIAC CATHETERIZATION  CARDIAC CATHETERIZATION 04/24/2019  Narrative  CULPRIT LESION: Mid RCA lesion is 85% stenosed.  A drug-eluting stent was successfully placed using a SYNERGY XD 3.0X20. Postdilated to 3.35 mm  Post intervention, there is a 0% residual stenosis.  Prox RCA lesion is 70% stenosed-focal dissection from guide catheter.  A drug-eluting stent was successfully placed using a SYNERGY XD 3.0X12. Postdilated to 3.35 mm  Post intervention, there is a 0% residual stenosis.  -------------  Mid LAD to Dist LAD mid having distal stent is 30% in-stent stenosed.  Dist LAD lesion is 80% stenosed - <1.5 mm vessel  -------------  LV end diastolic pressure is low.  SUMMARY  Severe two-vessel disease: Culprit lesion is mid RCA 85%, patent proximal-mid LAD stent with mild to moderate in-stent restenosis and a relatively small caliber (1.5 mm) LAD distally with a focal 80% stenosis (not PCI target)  Successful DES PCI of mid RCA using Synergy DES (3.0 mm x 20 mm -3.4 mm) with 2nd DES stent placed upstream for catheter related dissection using Synergy DES (3.0 mm x 12 mm-3.4 mm)  Low LVEDP-LV gram not performed to conserve contrast.  RECOMMENDATIONS  With successful Mynx closure device, the patient should be okay for same-day discharge today  Home medication list indicated that he was on clopidogrel, but not listed on MAR, I reordered  Continue aggressive antianginal management of distal  LAD disease  Agree with 2D echocardiogram as scheduled for early February   Follow-up with Dr. Antoine Poche or Judy Pimple, [place]    Bryan Lemma, MD  Findings Coronary Findings Diagnostic  Dominance: Right  Left Main Vessel was injected. Vessel is large. Vessel is angiographically normal.  Left Anterior Descending There is moderate diffuse disease throughout the vessel. Mid LAD to Dist LAD lesion is 30% stenosed. The lesion was previously treated using a drug eluting stent over 2 years ago. Stent with in-stent April 2018 Previously placed stent displays restenosis. Dist LAD lesion is 80% stenosed. The lesion is focal. Not PCI target At this point, the LAD is less than 2 mm in diameter.  Not PCI target  First Diagonal Branch Vessel is moderate in size.  Lateral First Diagonal Branch Vessel is moderate in size.  Right Coronary Artery Vessel is large. Prox RCA lesion is 70% stenosed. Guide catheter related dissection noted after initial stent placement Mid RCA lesion is 85% stenosed. Vessel is the culprit lesion. The lesion is focal. With tapering from either direction making the lesion longer  Acute Marginal Branch Vessel is small in size.  Right Ventricular Branch Vessel is small in size.  First Right Posterolateral Branch Vessel is large in size.  Second Right Posterolateral Branch Vessel is moderate in size.  Intervention  Prox RCA lesion Stent Lesion length:  10 mm. Lesion crossed with guidewire using a WIRE ASAHI PROWATER 180CM. Pre-stent angioplasty was not performed. A drug-eluting stent was successfully placed using a SYNERGY XD 3.0X12. Maximum pressure: 20 atm. Inflation time: 30 sec. Minimum lumen area:  3.3 mm. Stent strut is well apposed. Stent does not overlap previously placed stentPost-stent angioplasty was performed. Maximum pressure:  20 atm. Inflation time:  20 sec. Stent balloon, high ATM Post-Intervention Lesion Assessment The intervention was  successful. Pre-interventional TIMI flow is 3. Post-intervention TIMI flow is 3. Treated lesion length:  12 mm. No complications occurred at this lesion. There is a 0% residual stenosis post intervention.  Mid RCA lesion Stent Lesion length:  15 mm. CATH VISTA GUIDE 6FR JR4 guide catheter was inserted. Lesion crossed with guidewire using a WIRE ASAHI PROWATER 180CM. Pre-stent angioplasty was performed using a BALLOON SAPPHIRE 2.5X15. Maximum pressure:  12 atm. Inflation time: 20 sec. A drug-eluting stent was successfully placed using a SYNERGY XD 3.0X20. Maximum pressure: 14 atm. Inflation time: 30 sec. Minimum lumen area:  3.3 mm. Stent strut is well apposed. Post-stent angioplasty was performed. Maximum pressure:  20 atm. Inflation time:  20 sec. Stent balloon, high ATM Post-Intervention Lesion Assessment The intervention was successful. Pre-interventional TIMI flow is 3. Post-intervention TIMI flow is 3. Treated lesion length:  20 mm. There was a more proximal  RCA focal lesion caused by the guide catheter -&gt; preparations were made for stenting of this lesion There is a 0% residual stenosis post intervention.     ECHOCARDIOGRAM  ECHOCARDIOGRAM COMPLETE 08/08/2021  Narrative ECHOCARDIOGRAM REPORT    Patient Name:   Washington Washington Date of Exam: 08/08/2021 Medical Rec #:  [id number]    Height:       66.0 in Accession #:    [id number]   Weight:       151.0 lb Date of Birth:  [birth year]     BSA:          1.775 m Patient Age:    51 years     BP:           100/62 mmHg Patient Gender: M            HR:           71 bpm. [facility]  Procedure: 2D Echo, Cardiac Doppler and Color Doppler  Indications:    I25.5 (ICD-10-CM) - Ischemic cardiomyopathy  History:        Patient has prior history of Echocardiogram examinations, most recent 08/10/2019. Risk Factors:Hypertension, Diabetes and Dyslipidemia. ETOH, Seizure , Major depression, S/P percutaneous coronary  angioplasty.  Sonographer:    Celesta Gentile RCS Referring Phys: [id number] JAMES HOCHREIN  IMPRESSIONS   1. Wall motion and LVEF evaluation limited by poor visualization. The proximal to mid anteroseptal wall is hypokinetic and the distal anteroseptal is akinetic. [name] Distal anterior wall and the enitre apex are akinetic. LVEF appears roughly around 30%. Consider limited contrast study for more accurate assessment. . Left ventricular ejection fraction, by estimation, is 30%. The left ventricle has moderately decreased function. The left ventricle demonstrates regional wall motion abnormalities (see scoring diagram/findings for description). Left ventricular diastolic parameters are consistent with Grade I diastolic dysfunction (impaired relaxation). 2. Right ventricular systolic function is normal. The right ventricular size is normal. Tricuspid regurgitation signal is inadequate for assessing PA pressure. 3. The mitral valve is normal in structure. Trivial mitral valve regurgitation. No evidence of mitral stenosis. 4. The aortic valve is tricuspid. Aortic valve regurgitation is not visualized. No aortic stenosis is present. 5. The inferior vena cava is dilated in size with <50% respiratory variability, suggesting right atrial pressure of 15 mmHg.  FINDINGS Left Ventricle: Wall motion and LVEF evaluation limited by poor visualization. The proximal to mid anteroseptal wall is hypokinetic and the distal anteroseptal is akinetic. [name] Distal anterior wall and the enitre apex are akinetic. LVEF appears roughly around 30%. Consider limited contrast study for more accurate assessment. Left ventricular ejection fraction, by estimation, is 30%. The left ventricle has moderately decreased function. The left ventricle demonstrates regional wall motion abnormalities. The left ventricular internal cavity size was normal in size. There is no left ventricular hypertrophy. Left ventricular diastolic parameters are  consistent with Grade I diastolic dysfunction (impaired relaxation). Normal left ventricular filling pressure.  Right Ventricle: The right ventricular size is normal. Right vetricular wall thickness was not well visualized. Right ventricular systolic function is normal. Tricuspid regurgitation signal is inadequate for assessing PA pressure.  Left Atrium: Left atrial size was normal in size.  Right Atrium: Right atrial size was normal in size.  Pericardium: There is no evidence of pericardial effusion.  Mitral Valve: The mitral valve is normal in structure. Trivial mitral valve regurgitation. No evidence of mitral valve stenosis.  Tricuspid Valve: The tricuspid valve is normal in structure. Tricuspid valve regurgitation is  not demonstrated. No evidence of tricuspid stenosis.  Aortic Valve: The aortic valve is tricuspid. Aortic valve regurgitation is not visualized. No aortic stenosis is present. Aortic valve mean gradient measures 1.7 mmHg. Aortic valve peak gradient measures 3.2 mmHg. Aortic valve area, by VTI measures 2.91 cm.  Pulmonic Valve: The pulmonic valve was not well visualized. Pulmonic valve regurgitation is trivial. No evidence of pulmonic stenosis.  Aorta: The aortic root is normal in size and structure.  Venous: The inferior vena cava is dilated in size with less than 50% respiratory variability, suggesting right atrial pressure of 15 mmHg.  IAS/Shunts: No atrial level shunt detected by color flow Doppler.   LEFT VENTRICLE PLAX 2D LVIDd:         4.80 cm   Diastology LVIDs:         3.40 cm   LV e' medial:    3.59 cm/s LV PW:         1.00 cm   LV E/e' medial:  12.8 LV IVS:        1.00 cm   LV e' lateral:   6.96 cm/s LVOT diam:     2.00 cm   LV E/e' lateral: 6.6 LV SV:         39 LV SV Index:   22 LVOT Area:     3.14 cm   RIGHT VENTRICLE RV S prime:     11.10 cm/s TAPSE (M-mode): 1.4 cm  LEFT ATRIUM             Index        RIGHT ATRIUM           Index LA  diam:        3.70 cm 2.08 cm/m   RA Area:     11.10 cm LA Vol (A2C):   35.8 ml 20.17 ml/m  RA Volume:   23.20 ml  13.07 ml/m LA Vol (A4C):   34.1 ml 19.21 ml/m LA Biplane Vol: 37.3 ml 21.02 ml/m AORTIC VALVE AV Area (Vmax):    2.48 cm AV Area (Vmean):   2.50 cm AV Area (VTI):     2.91 cm AV Vmax:           89.59 cm/s AV Vmean:          62.939 cm/s AV VTI:            0.133 m AV Peak Grad:      3.2 mmHg AV Mean Grad:      1.7 mmHg LVOT Vmax:         70.70 cm/s LVOT Vmean:        50.100 cm/s LVOT VTI:          0.123 m LVOT/AV VTI ratio: 0.93  AORTA Ao Root diam: 3.30 cm  MITRAL VALVE MV Area (PHT): 3.12 cm    SHUNTS MV Decel Time: 243 msec    Systemic VTI:  0.12 m MV E velocity: 46.00 cm/s  Systemic Diam: 2.00 cm MV A velocity: 88.60 cm/s MV E/A ratio:  0.52  Dina Rich MD Electronically signed by Dina Rich MD Signature Date/Time: 08/08/2021/3:37:10 PM    Final    MONITORS  LONG TERM MONITOR (3-14 DAYS) 10/01/2019  Narrative NSR Occasional ventricular ectopy NSVT with the longest run being 8 beats. No sustained arrhythmias.           Risk Assessment/Calculations:            Physical Exam:   VS:  BP [name])  142/100 (BP Location: Left Arm, Patient Position: Sitting, Cuff Size: Normal)   Pulse 76   Ht 5\' 9"  (1.753 m)   Wt 143 lb (64.9 kg)   BMI 21.12 kg/m    Wt Readings from Last 3 Encounters:  12/17/22 143 lb (64.9 kg)  12/07/22 144 lb 4 oz (65.4 kg)  11/07/22 146 lb (66.2 kg)    GEN: Well nourished, well developed in no acute distress NECK: No JVD; No carotid bruits CARDIAC: RRR, no murmurs, rubs, gallops RESPIRATORY:  Clear to auscultation without rales, wheezing or rhonchi  ABDOMEN: Soft, non-tender, non-distended EXTREMITIES:  No edema; No deformity   ASSESSMENT AND PLAN: .    Chronic systolic heart failure: He has stopped all GDMT.  I will restart carvedilol at a lower dose of 3.125 mg twice a day and add Jardiance.  I plan to  bring the patient back in a few weeks and likely restart olmesartan if blood pressure allows.  Emphasized on compliance of heart failure medication  CAD: Denies any recent chest pain  Hypertension: Blood pressure elevated, however patient has stopped all GDMT.  Hyperlipidemia: On rosuvastatin  DM2: Managed by primary care provider  CKD stage III: Creatinine around 2.0.       Dispo: Follow-up in 3 weeks for titration of heart failure therapy.  Signed, Azalee Course, PA

## 2022-12-19 ENCOUNTER — Ambulatory Visit (INDEPENDENT_AMBULATORY_CARE_PROVIDER_SITE_OTHER): Payer: Medicaid Other | Admitting: Neurology

## 2022-12-19 ENCOUNTER — Encounter: Payer: Self-pay | Admitting: Neurology

## 2022-12-19 VITALS — BP 112/65 | HR 85 | Ht 68.0 in | Wt 144.0 lb

## 2022-12-19 DIAGNOSIS — E785 Hyperlipidemia, unspecified: Secondary | ICD-10-CM

## 2022-12-19 DIAGNOSIS — I2510 Atherosclerotic heart disease of native coronary artery without angina pectoris: Secondary | ICD-10-CM | POA: Diagnosis not present

## 2022-12-19 DIAGNOSIS — N183 Chronic kidney disease, stage 3 unspecified: Secondary | ICD-10-CM

## 2022-12-19 DIAGNOSIS — R569 Unspecified convulsions: Secondary | ICD-10-CM | POA: Diagnosis not present

## 2022-12-19 DIAGNOSIS — E1165 Type 2 diabetes mellitus with hyperglycemia: Secondary | ICD-10-CM | POA: Diagnosis not present

## 2022-12-19 DIAGNOSIS — I509 Heart failure, unspecified: Secondary | ICD-10-CM

## 2022-12-19 DIAGNOSIS — E1169 Type 2 diabetes mellitus with other specified complication: Secondary | ICD-10-CM

## 2022-12-19 DIAGNOSIS — E1159 Type 2 diabetes mellitus with other circulatory complications: Secondary | ICD-10-CM

## 2022-12-19 DIAGNOSIS — Z794 Long term (current) use of insulin: Secondary | ICD-10-CM | POA: Diagnosis not present

## 2022-12-19 DIAGNOSIS — Z9861 Coronary angioplasty status: Secondary | ICD-10-CM

## 2022-12-19 DIAGNOSIS — I152 Hypertension secondary to endocrine disorders: Secondary | ICD-10-CM

## 2022-12-19 MED ORDER — LEVETIRACETAM 750 MG PO TABS: 750.0000 mg | ORAL_TABLET | Freq: Two times a day (BID) | ORAL | 11 refills | Status: AC

## 2022-12-19 NOTE — Progress Notes (Signed)
[facility]  PATIENT: [name] DOB: [birth year]  REFERRING DOCTOR OR PCP:  Harlow Mares, FNP SOURCE: Patient, son, notes from recent hospitalization, imaging and lab reports, MRI and CT scan images reviewed.  _________________________________   HISTORICAL  CHIEF COMPLAINT:  Chief Complaint  Patient presents with   New Patient (Initial Visit)    Pt in room 10 son in room. New patient here for seizure activity. Son reports for 1 year seizure like activity. Son said before pt goes to sleep has seizure activity.  Pt doesn't remember any of these episodes.     HISTORY OF PRESENT ILLNESS:  I had the pleasure of seeing your patient, [name], at [facility] for neurologic consultation regarding his seizures.  He is a 52 yo man who had his first convulsive seizure in 2021.  At the time he was taken to the emergency room.  CT and MRI showed no acute findings.  By my review, he did have more than typical white matter changes consistent with chronic microvascular ischemic change on the MRI.  Since then he has had frequent spells of staring spell during the day.  Additionally, he has had frequent convulsive episode right before he goes to sleep while laying in bed.  These last about one minute.    Convulsive episodes are very rare at any other time.  He usually groggy for a minute or 2 after the spell and then becomes alert again.  More recently, he had an episode of LOC, followed by gasping and respiratory arrest 11/14/2022.   CPR was initiated and he was taken [facility] and then transferred to [facility].  The 3-4 hour EEG/video was normal 11/14/2022 showing wakefulness and drowsiness but no seizures.     According to his notes, he was reported to be taking his outpatient medications irregularly at the time.    He was discharged on levetiracetam 500 mg po bid. He has not had as many of the night time seizures but has had a few.  He  has no recent staring spell  He has CHF and is on Imdur, Losartan and Coreg.  EF% is 30-35%.  He also has CAD, NIDDM and Stge 3 CRF  REVIEW OF SYSTEMS: Constitutional: No fevers, chills, sweats, or change in appetite Eyes: No visual changes, double vision, eye pain Ear, nose and throat: No hearing loss, ear pain, nasal congestion, sore throat Cardiovascular: He has CHF.  He has angina. Respiratory:  No shortness of breath at rest or with exertion.   No wheezes GastrointestinaI: No nausea, vomiting, diarrhea, abdominal pain, fecal incontinence Genitourinary:  No dysuria, urinary retention or frequency.  No nocturia.  He has stage III chronic renal failure. Musculoskeletal:  No neck pain, back pain Integumentary: No rash, pruritus, skin lesions Neurological: as above Psychiatric: No depression at this time.  No anxiety Endocrine: No palpitations, diaphoresis, change in appetite, change in weigh or increased thirst Hematologic/Lymphatic:  No anemia, purpura, petechiae. Allergic/Immunologic: No itchy/runny eyes, nasal congestion, recent allergic reactions, rashes  ALLERGIES: Allergies  Allergen Reactions   Penicillins Rash         HOME MEDICATIONS:  Current Outpatient Medications:    Accu-Chek Softclix Lancets lancets, Test BS QID Dx E11.9, Disp: 400 each, Rfl: 3   aspirin 81 MG chewable tablet, Chew 1 tablet (81 mg total) by mouth daily with breakfast., Disp: 120 tablet, Rfl: 2   carvedilol (COREG) 3.125 MG tablet, Take 1 tablet (3.125 mg total) by  mouth 2 (two) times daily., Disp: 180 tablet, Rfl: 3   empagliflozin (JARDIANCE) 10 MG TABS tablet, Take 1 tablet (10 mg total) by mouth daily before breakfast., Disp: , Rfl:    fluticasone (FLONASE) 50 MCG/ACT nasal spray, Place 2 sprays into both nostrils daily., Disp: 16 g, Rfl: 6   levETIRAcetam (KEPPRA) 750 MG tablet, Take 1 tablet (750 mg total) by mouth 2 (two) times daily., Disp: 60 tablet, Rfl: 11   rosuvastatin (CRESTOR) 40 MG  tablet, Take 1 tablet (40 mg total) by mouth daily., Disp: 90 tablet, Rfl: 3   citalopram (CELEXA) 10 MG tablet, Take 1 tablet (10 mg total) by mouth daily. (Patient not taking: Reported on 12/19/2022), Disp: 90 tablet, Rfl: 1   Continuous Blood Gluc Receiver (FREESTYLE LIBRE 2 READER) DEVI, 1 each by Does not apply route daily. (Patient not taking: Reported on 12/07/2022), Disp: 1 each, Rfl: 0   Continuous Blood Gluc Sensor (FREESTYLE LIBRE 2 SENSOR) MISC, 1 each by Does not apply route every 14 (fourteen) days. (Patient not taking: Reported on 12/07/2022), Disp: 2 each, Rfl: 2   gabapentin (NEURONTIN) 300 MG capsule, TAKE ONE CAPSULE BY MOUTH THREE TIMES DAILY (Patient not taking: Reported on 12/07/2022), Disp: 180 capsule, Rfl: 0   glucose blood (ACCU-CHEK GUIDE) test strip, Test BS QID Dx E11.9 (Patient not taking: Reported on 12/07/2022), Disp: 400 each, Rfl: 3   insulin aspart (NOVOLOG) 100 UNIT/ML FlexPen, Inject 0-10 Units into the skin See admin instructions. insulin aspart (novoLOG) injection 0-10 Units 0-10 Units Subcutaneous, 3 times daily with meals CBG < 70: Implement Hypoglycemia Standing Orders and refer to Hypoglycemia Standing Orders sidebar report  CBG 70 - 120: 0 unit CBG 121 - 150: 0 unit  CBG 151 - 200: 1 unit CBG 201 - 250: 2 units CBG 251 - 300: 4 units CBG 301 - 350: 6 units  CBG 351 - 400: 8 units  CBG > 400: 10 units (Patient not taking: Reported on 12/07/2022), Disp: 15 mL, Rfl: 11   insulin glargine (LANTUS) 100 UNIT/ML injection, Inject 0.18 mLs (18 Units total) into the skin at bedtime. (Patient not taking: Reported on 12/07/2022), Disp: 10 mL, Rfl: 4   Insulin Pen Needle (PEN NEEDLES) 32G X 5 MM MISC, Use with Novolog Flexpen as directed (Patient not taking: Reported on 12/07/2022), Disp: 100 each, Rfl: 3   Insulin Syringe-Needle U-100 (TRUEPLUS INSULIN SYRINGE) 31G X 5/16" 1 ML MISC, Use to inject insulin once daily Dx E11.8 (Patient not taking: Reported on 12/07/2022), Disp: 100  each, Rfl: 3   levocetirizine (XYZAL) 5 MG tablet, Take 1 tablet (5 mg total) by mouth every evening. (Patient not taking: Reported on 12/07/2022), Disp: 30 tablet, Rfl: 2   nitroGLYCERIN (NITROSTAT) 0.4 MG SL tablet, Place 1 tablet (0.4 mg total) under the tongue every 5 (five) minutes as needed for chest pain. (Patient not taking: Reported on 12/19/2022), Disp: 25 tablet, Rfl: 4   olmesartan (BENICAR) 40 MG tablet, Take 1 tablet (40 mg total) by mouth daily. (Patient not taking: Reported on 12/07/2022), Disp: 90 tablet, Rfl: 3  PAST MEDICAL HISTORY: Past Medical History:  Diagnosis Date   Alcohol abuse    CAD (coronary artery disease), native coronary artery    a. 01/2009 s/p Anterior MI and thrombectomy with BMS to mid LAD;  b. 06/2016 Cath/PCI: LM nl, LAD 62m/d, LCX nl, OM1/2/3 nl, RDA 43m. Initial attempt @ PCI failed followed by successful CTO LAD PCI and DES (2.5x38 Synergy)-->rec  for indefinte DAPT.   Chronic congestive heart failure (HCC) 12/07/2022   CKD (chronic kidney disease), stage III (HCC)    Essential hypertension    History of nonadherence to medical treatment    Hyperlipidemia    Ischemic cardiomyopathy    a. EF 40% by nuc in 2012;  b. 06/2016 Echo: EF 40-45%, apicalanteroseptal and apical AK, Gr2 DD, mod Ca2+ Ao annulus, mild MR.   Slurred speech - Chronic    Type 2 diabetes mellitus (HCC)     PAST SURGICAL HISTORY: Past Surgical History:  Procedure Laterality Date   CORONARY CTO INTERVENTION N/A 07/11/2016   Procedure: Coronary CTO Intervention;  Surgeon: Peter M Swaziland, MD;  Location: [facility] INVASIVE CV LAB;  Service: Cardiovascular;  Laterality: N/A;   CORONARY STENT INTERVENTION N/A 07/09/2016   Procedure: Coronary Stent Intervention;  Surgeon: Runell Gess, MD;  Location: [facility] INVASIVE CV LAB;  Service: Cardiovascular;  Laterality: N/A;   CORONARY STENT INTERVENTION N/A 04/24/2019   Procedure: CORONARY STENT INTERVENTION;  Surgeon: Marykay Lex, MD;  Location: [facility]  INVASIVE CV LAB;  Service: Cardiovascular;  Laterality: N/A;   Knee arthroscopic surgery Right    LEFT HEART CATH AND CORONARY ANGIOGRAPHY N/A 07/05/2016   Procedure: Left Heart Cath and Coronary Angiography;  Surgeon: Lyn Records, MD;  Location: [facility] INVASIVE CV LAB;  Service: Cardiovascular;  Laterality: N/A;   LEFT HEART CATH AND CORONARY ANGIOGRAPHY N/A 04/24/2019   Procedure: LEFT HEART CATH AND CORONARY ANGIOGRAPHY;  Surgeon: Marykay Lex, MD;  Location: [facility] INVASIVE CV LAB;  Service: Cardiovascular;  Laterality: N/A;   LEFT HEART CATH AND CORONARY ANGIOGRAPHY N/A 07/06/2021   Procedure: LEFT HEART CATH AND CORONARY ANGIOGRAPHY;  Surgeon: Swaziland, Peter M, MD;  Location: [facility] INVASIVE CV LAB;  Service: Cardiovascular;  Laterality: N/A;    FAMILY HISTORY: Family History  Problem Relation Age of Onset   Diabetes Mellitus II Other    Heart disease Mother     SOCIAL HISTORY: Social History   Socioeconomic History   Marital status: Married    Spouse name: Not on file   Number of children: Not on file   Years of education: Not on file   Highest education level: Not on file  Occupational History   Not on file  Tobacco Use   Smoking status: Never   Smokeless tobacco: Never  Vaping Use   Vaping status: Never Used  Substance and Sexual Activity   Alcohol use: No    Alcohol/week: 1.0 standard drink of alcohol    Types: 1 Cans of beer per week   Drug use: No   Sexual activity: Yes  Other Topics Concern   Not on file  Social History Narrative   Married with one child   Lives in [place], [place]   Right Handed   One story home   Drinks some caffeine (Green Tea)         Right handed    Drinks coffee sometimes    Drinks sodas one per day   Social Determinants of Health   Financial Resource Strain: Not on file  Food Insecurity: Not on file  Transportation Needs: No Transportation Needs (11/14/2022)   Received from [facility] - Transportation    Lack of  Transportation (Medical): No    Lack of Transportation (Non-Medical): No  Physical Activity: Not on file  Stress: No Stress Concern Present (11/13/2022)   Received from [facility] -  Occupational Stress Questionnaire    Feeling of Stress : Not at all  Social Connections: Unknown (11/12/2022)   Received from [facility]   Social Network    Social Network: Not on file  Intimate Partner Violence: Not At Risk (11/13/2022)   Received from [facility]   HITS    Over the last 12 months how often did your partner physically hurt you?: 1    Over the last 12 months how often did your partner insult you or talk down to you?: 1    Over the last 12 months how often did your partner threaten you with physical harm?: 1    Over the last 12 months how often did your partner scream or curse at you?: 1       PHYSICAL EXAM  Vitals:   12/19/22 1447  BP: 112/65  Pulse: 85  Weight: 144 lb (65.3 kg)  Height: 5\' 8"  (1.727 m)    Body mass index is 21.9 kg/m.   General: The patient is well-developed and well-nourished man in no acute distress  HEENT:  Head is [place]/AT.  Sclera are anicteric.   Neck: No carotid bruits are noted.  The neck is nontender.  Cardiovascular: The heart has a regular rate and rhythm with a normal S1 and S2. There were no murmurs, gallops or rubs.    Skin: Extremities are without rash or  edema.  Musculoskeletal:  Back is nontender  Neurologic Exam  Mental status: The patient is alert and oriented x 3 at the time of the examination. The patient has apparent normal recent and remote memory, with an apparently normal attention span and concentration ability.   Speech is normal.  Cranial nerves: Extraocular movements are full.   Facial symmetry is present. There is good facial sensation to soft touch bilaterally.Facial strength is normal.  Trapezius and sternocleidomastoid strength is normal. No dysarthria is noted.  The tongue  is midline, and the patient has symmetric elevation of the soft palate. No obvious hearing deficits are noted.  Motor:  Muscle bulk is normal.   Tone is normal. Strength is  5 / 5 in all 4 extremities.   Sensory: Sensory testing is intact to pinprick, soft touch and vibration sensation in all 4 extremities.  Coordination: Cerebellar testing reveals good finger-nose-finger and heel-to-shin bilaterally.  Gait and station: Station is normal.   Gait is normal. Tandem gait is mildly wide. Romberg is negative.   Reflexes: Deep tendon reflexes are symmetric and normal bilaterally.   Plantar responses are flexor.    DIAGNOSTIC DATA (LABS, IMAGING, TESTING) - I reviewed patient records, labs, notes, testing and imaging myself where available.  Lab Results  Component Value Date   WBC 8.9 12/07/2022   HGB 14.8 12/07/2022   HCT 45.3 12/07/2022   MCV 85 12/07/2022   PLT 227 12/07/2022      Component Value Date/Time   NA 141 12/07/2022 0958   K 4.7 12/07/2022 0958   CL 106 12/07/2022 0958   CO2 18 (L) 12/07/2022 0958   GLUCOSE 151 (H) 12/07/2022 0958   GLUCOSE 158 (H) 09/12/2019 1636   BUN 21 12/07/2022 0958   CREATININE 2.00 (H) 12/07/2022 0958   CREATININE 1.79 (H) 05/19/2019 1357   CALCIUM 9.1 12/07/2022 0958   PROT 7.1 12/07/2022 0958   ALBUMIN 4.6 12/07/2022 0958   AST 18 12/07/2022 0958   ALT 14 12/07/2022 0958   ALKPHOS 94 12/07/2022 0958   BILITOT 0.5 12/07/2022 0958   GFRNONAA  44 (L) 05/10/2020 0927   GFRNONAA 44 (L) 05/19/2019 1357   GFRAA 51 (L) 05/10/2020 0927   GFRAA 51 (L) 05/19/2019 1357   Lab Results  Component Value Date   CHOL 192 11/08/2022   HDL 45 11/08/2022   LDLCALC 124 (H) 11/08/2022   TRIG 127 11/08/2022   CHOLHDL 4.3 11/08/2022   Lab Results  Component Value Date   HGBA1C 7.9 (H) 11/08/2022   No results found for: "VITAMINB12" Lab Results  Component Value Date   TSH 3.340 12/07/2022       ASSESSMENT AND PLAN  Seizures (HCC) - Plan: MR  BRAIN WO CONTRAST  CAD S/P percutaneous coronary angioplasty  Chronic congestive heart failure, unspecified heart failure type (HCC)  Type 2 diabetes mellitus with hyperglycemia, with long-term current use of insulin (HCC)  Hyperlipidemia associated with type 2 diabetes mellitus (HCC)  Hypertension associated with diabetes (HCC)  Chronic kidney disease (CKD), active medical management without dialysis, stage 3 (moderate) (HCC)   In summary, Mr. Greulich is a 52 year old man who has had different types of spells of altered awareness over the last 4 years.  He frequently has an episode into the drowsiness or light sleep early in the night.  He also has had staring spells without convulsion.   4-hour EEG recently did not show any epileptiform activity.  At the time of his recent admission, he was not on any antiepileptic medication and was discharged on Keppra 500 mg twice daily with incomplete benefit  Increase levetiracetam to 750 mg po bid MRI brain If continues to have spells will check 48 hour ambulatory EEG Rtc 6 months or sooner if there are new or worsening neurologic symptoms.  Thank you for asked me to see Mr. Lantier.  Please let me know if I can be of further assistance with him or other patients in the future. Nyrie Sigal A. Epimenio Foot, MD, [employer] 12/19/2022, 3:38 PM Certified in Neurology, Clinical Neurophysiology, Sleep Medicine and Neuroimaging  [facility] [address], [address] [place], [place] [address] [phone]

## 2022-12-20 ENCOUNTER — Telehealth: Payer: Self-pay | Admitting: Neurology

## 2022-12-20 NOTE — Telephone Encounter (Signed)
[facility] [place]: [phone] exp. 12/20/22-02/17/23 sent to GI [phone]

## 2022-12-24 ENCOUNTER — Ambulatory Visit: Payer: Medicaid Other | Admitting: Physician Assistant

## 2022-12-25 ENCOUNTER — Telehealth: Payer: Self-pay | Admitting: Family Medicine

## 2022-12-25 DIAGNOSIS — E118 Type 2 diabetes mellitus with unspecified complications: Secondary | ICD-10-CM

## 2022-12-25 MED ORDER — INSULIN ASPART 100 UNIT/ML FLEXPEN: 0.0000 [IU] | PEN_INJECTOR | SUBCUTANEOUS | 0 refills | Status: AC

## 2022-12-25 MED ORDER — INSULIN GLARGINE 100 UNIT/ML ~~LOC~~ SOLN: 18.0000 [IU] | Freq: Every day | SUBCUTANEOUS | 0 refills | Status: AC

## 2022-12-25 NOTE — Telephone Encounter (Signed)
Per [facility], they can't see all directions of the insulin aspart (NOVOLOG) 100 UNIT/ML FlexPen because directions are too long. Can they be summed up and Rx be resent or can someone call to give verbal?

## 2022-12-25 NOTE — Telephone Encounter (Signed)
Refer to previous telephone call.

## 2022-12-25 NOTE — Telephone Encounter (Signed)
  Prescription Request  12/25/2022  Is this a "Controlled Substance" medicine? NO  Have you seen your PCP in the last 2 weeks? NO  If YES, route message to pool  -  If NO, patient needs to be scheduled for appointment.  What is the name of the medication or equipment? Insulin aspart 100 unit/mc Flexpen, Insulin Glargine 100 unt/ml  Have you contacted your pharmacy to request a refill? YES   Which pharmacy would you like this sent to? [facility]   Patient notified that their request is being sent to the clinical staff for review and that they should receive a response within 2 business days.

## 2022-12-25 NOTE — Telephone Encounter (Signed)
Aware refills sent to pharmacy 

## 2022-12-26 ENCOUNTER — Telehealth: Payer: Self-pay | Admitting: Family Medicine

## 2022-12-26 NOTE — Telephone Encounter (Signed)
  Prescription Request  12/26/2022  Is this a "Controlled Substance" medicine? no  Have you seen your PCP in the last 2 weeks? Last seen on 09/13  If YES, route message to pool  -  If NO, patient needs to be scheduled for appointment.  What is the name of the medication or equipment? Syringes for insulin  Have you contacted your pharmacy to request a refill? yes   Which pharmacy would you like this sent to? [facility]    Patient notified that their request is being sent to the clinical staff for review and that they should receive a response within 2 business days.

## 2022-12-27 MED ORDER — "INSULIN SYRINGE-NEEDLE U-100 31G X 5/16"" 1 ML MISC": 3 refills | Status: AC

## 2022-12-27 NOTE — Telephone Encounter (Signed)
Ok to send Rx 

## 2022-12-27 NOTE — Telephone Encounter (Signed)
Pt calling to check on status of this refill request. He says he is completely out and needs refills sent in ASAP.

## 2022-12-27 NOTE — Telephone Encounter (Signed)
Aware refill sent to pharmacy ?

## 2022-12-27 NOTE — Addendum Note (Signed)
Addended by: Julious Payer D on: 12/27/2022 03:09 PM   Modules accepted: Orders

## 2022-12-28 ENCOUNTER — Ambulatory Visit
Admission: RE | Admit: 2022-12-28 | Discharge: 2022-12-28 | Disposition: A | Discharge status: Home / Self Care | Payer: Medicaid Other | Source: Ambulatory Visit | Attending: Neurology | Admitting: Neurology

## 2022-12-28 DIAGNOSIS — R569 Unspecified convulsions: Secondary | ICD-10-CM | POA: Diagnosis not present

## 2023-01-03 ENCOUNTER — Telehealth: Payer: Self-pay | Admitting: Neurology

## 2023-01-03 NOTE — Telephone Encounter (Signed)
Left message with after hour service on 01-03-23   She states that she was trying to resch appt. I called her back and had to leave a message for her.

## 2023-01-10 ENCOUNTER — Telehealth: Payer: Self-pay | Admitting: Neurology

## 2023-01-10 NOTE — Telephone Encounter (Signed)
Pt is wanting to know when he will get the results of his MRI. Please advise.

## 2023-01-10 NOTE — Telephone Encounter (Signed)
I called and spoke with patient son Gerilyn Pilgrim ( has DPR access) informed son that per result note on 12/28/22.   " Please let the patient know that the MRI did not show anything that looked brand-new but there was a small stroke that has happened since the MRI in 2021.  Because is fairly small and probably did not cause any symptoms "   Gerilyn Pilgrim said he will relay to patient.

## 2023-01-11 ENCOUNTER — Ambulatory Visit: Payer: Medicaid Other | Attending: Physician Assistant | Admitting: Physician Assistant

## 2023-01-11 ENCOUNTER — Encounter: Payer: Self-pay | Admitting: Physician Assistant

## 2023-01-11 VITALS — BP 140/96 | HR 85 | Ht <= 58 in | Wt 143.6 lb

## 2023-01-11 DIAGNOSIS — I255 Ischemic cardiomyopathy: Secondary | ICD-10-CM

## 2023-01-11 DIAGNOSIS — I10 Essential (primary) hypertension: Secondary | ICD-10-CM | POA: Diagnosis not present

## 2023-01-11 DIAGNOSIS — I2510 Atherosclerotic heart disease of native coronary artery without angina pectoris: Secondary | ICD-10-CM

## 2023-01-11 DIAGNOSIS — E785 Hyperlipidemia, unspecified: Secondary | ICD-10-CM

## 2023-01-11 DIAGNOSIS — E119 Type 2 diabetes mellitus without complications: Secondary | ICD-10-CM

## 2023-01-11 DIAGNOSIS — R569 Unspecified convulsions: Secondary | ICD-10-CM

## 2023-01-11 DIAGNOSIS — N1832 Chronic kidney disease, stage 3b: Secondary | ICD-10-CM

## 2023-01-11 MED ORDER — NITROGLYCERIN 0.4 MG SL SUBL: 0.4000 mg | SUBLINGUAL_TABLET | SUBLINGUAL | 4 refills | Status: AC | PRN

## 2023-01-11 NOTE — Patient Instructions (Signed)
Medication Instructions:  NO CHANGES *If you need a refill on your cardiac medications before your next appointment, please call your pharmacy*   Lab Work: BMP TODAY If you have labs (blood work) drawn today and your tests are completely normal, you will receive your results only by: MyChart Message (if you have MyChart) OR A paper copy in the mail If you have any lab test that is abnormal or we need to change your treatment, we will call you to review the results.   Testing/Procedures: NO TESTING   Follow-Up: At [facility], you and your health needs are our priority.  As part of our continuing mission to provide you with exceptional heart care, we have created designated Provider Care Teams.  These Care Teams include your primary Cardiologist (physician) and Advanced Practice Providers (APPs -  Physician Assistants and Nurse Practitioners) who all work together to provide you with the care you need, when you need it.   Your next appointment:   2-3 month(s)  Provider:   Rollene Rotunda, MD

## 2023-01-11 NOTE — Progress Notes (Signed)
using a BALLOON SAPPHIRE 2.5X15. Maximum pressure:  12 atm. Inflation time: 20 sec. A drug-eluting stent was successfully placed using a SYNERGY XD 3.0X20. Maximum pressure: 14 atm. Inflation time: 30 sec. Minimum lumen area:  3.3 mm. Stent strut is well apposed. Post-stent angioplasty was performed. Maximum pressure:  20 atm. Inflation time:  20 sec. Stent balloon, high ATM Post-Intervention Lesion Assessment The intervention was successful. Pre-interventional TIMI flow is 3. Post-intervention TIMI flow is 3. Treated lesion length:  20 mm. There was a more proximal RCA focal lesion caused by the guide catheter -&gt; preparations were made for stenting of this lesion There is a 0% residual stenosis post intervention.     ECHOCARDIOGRAM  ECHOCARDIOGRAM COMPLETE 08/08/2021  Narrative ECHOCARDIOGRAM REPORT    Patient Name:   [name] Date of Exam: 08/08/2021 Medical Rec #:  [id number]    Height:       66.0 in Accession #:    [id number]   Weight:       151.0 lb Date of Birth:  [birth year]     BSA:          1.775 m Patient Age:    52 years     BP:           100/62  mmHg Patient Gender: M            HR:           71 bpm. [facility]  Procedure: 2D Echo, Cardiac Doppler and Color Doppler  Indications:    I25.5 (ICD-10-CM) - Ischemic cardiomyopathy  History:        Patient has prior history of Echocardiogram examinations, most recent 08/10/2019. Risk Factors:Hypertension, Diabetes and Dyslipidemia. ETOH, Seizure , Major depression, S/P percutaneous coronary angioplasty.  Sonographer:    Celesta Gentile RCS Referring Phys: [id number] JAMES HOCHREIN  IMPRESSIONS   1. Wall motion and LVEF evaluation limited by poor visualization. The proximal to mid anteroseptal wall is hypokinetic and the distal anteroseptal is akinetic. [name] Distal anterior wall and the enitre apex are akinetic. LVEF appears roughly around 30%. Consider limited contrast study for more accurate assessment. . Left ventricular ejection fraction, by estimation, is 30%. The left ventricle has moderately decreased function. The left ventricle demonstrates regional wall motion abnormalities (see scoring diagram/findings for description). Left ventricular diastolic parameters are consistent with Grade I diastolic dysfunction (impaired relaxation). 2. Right ventricular systolic function is normal. The right ventricular size is normal. Tricuspid regurgitation signal is inadequate for assessing PA pressure. 3. The mitral valve is normal in structure. Trivial mitral valve regurgitation. No evidence of mitral stenosis. 4. The aortic valve is tricuspid. Aortic valve regurgitation is not visualized. No aortic stenosis is present. 5. The inferior vena cava is dilated in size with <50% respiratory variability, suggesting right atrial pressure of 15 mmHg.  FINDINGS Left Ventricle: Wall motion and LVEF evaluation limited by poor visualization. The proximal to mid anteroseptal wall is hypokinetic and the distal anteroseptal is akinetic. [name] Distal anterior wall and the enitre apex are akinetic. LVEF  appears roughly around 30%. Consider limited contrast study for more accurate assessment. Left ventricular ejection fraction, by estimation, is 30%. The left ventricle has moderately decreased function. The left ventricle demonstrates regional wall motion abnormalities. The left ventricular internal cavity size was normal in size. There is no left ventricular hypertrophy. Left ventricular diastolic parameters are consistent with Grade I diastolic dysfunction (impaired relaxation). Normal left ventricular filling pressure.  Right Ventricle: The right  using a BALLOON SAPPHIRE 2.5X15. Maximum pressure:  12 atm. Inflation time: 20 sec. A drug-eluting stent was successfully placed using a SYNERGY XD 3.0X20. Maximum pressure: 14 atm. Inflation time: 30 sec. Minimum lumen area:  3.3 mm. Stent strut is well apposed. Post-stent angioplasty was performed. Maximum pressure:  20 atm. Inflation time:  20 sec. Stent balloon, high ATM Post-Intervention Lesion Assessment The intervention was successful. Pre-interventional TIMI flow is 3. Post-intervention TIMI flow is 3. Treated lesion length:  20 mm. There was a more proximal RCA focal lesion caused by the guide catheter -&gt; preparations were made for stenting of this lesion There is a 0% residual stenosis post intervention.     ECHOCARDIOGRAM  ECHOCARDIOGRAM COMPLETE 08/08/2021  Narrative ECHOCARDIOGRAM REPORT    Patient Name:   [name] Date of Exam: 08/08/2021 Medical Rec #:  [id number]    Height:       66.0 in Accession #:    [id number]   Weight:       151.0 lb Date of Birth:  [birth year]     BSA:          1.775 m Patient Age:    52 years     BP:           100/62  mmHg Patient Gender: M            HR:           71 bpm. [facility]  Procedure: 2D Echo, Cardiac Doppler and Color Doppler  Indications:    I25.5 (ICD-10-CM) - Ischemic cardiomyopathy  History:        Patient has prior history of Echocardiogram examinations, most recent 08/10/2019. Risk Factors:Hypertension, Diabetes and Dyslipidemia. ETOH, Seizure , Major depression, S/P percutaneous coronary angioplasty.  Sonographer:    Celesta Gentile RCS Referring Phys: [id number] JAMES HOCHREIN  IMPRESSIONS   1. Wall motion and LVEF evaluation limited by poor visualization. The proximal to mid anteroseptal wall is hypokinetic and the distal anteroseptal is akinetic. [name] Distal anterior wall and the enitre apex are akinetic. LVEF appears roughly around 30%. Consider limited contrast study for more accurate assessment. . Left ventricular ejection fraction, by estimation, is 30%. The left ventricle has moderately decreased function. The left ventricle demonstrates regional wall motion abnormalities (see scoring diagram/findings for description). Left ventricular diastolic parameters are consistent with Grade I diastolic dysfunction (impaired relaxation). 2. Right ventricular systolic function is normal. The right ventricular size is normal. Tricuspid regurgitation signal is inadequate for assessing PA pressure. 3. The mitral valve is normal in structure. Trivial mitral valve regurgitation. No evidence of mitral stenosis. 4. The aortic valve is tricuspid. Aortic valve regurgitation is not visualized. No aortic stenosis is present. 5. The inferior vena cava is dilated in size with <50% respiratory variability, suggesting right atrial pressure of 15 mmHg.  FINDINGS Left Ventricle: Wall motion and LVEF evaluation limited by poor visualization. The proximal to mid anteroseptal wall is hypokinetic and the distal anteroseptal is akinetic. [name] Distal anterior wall and the enitre apex are akinetic. LVEF  appears roughly around 30%. Consider limited contrast study for more accurate assessment. Left ventricular ejection fraction, by estimation, is 30%. The left ventricle has moderately decreased function. The left ventricle demonstrates regional wall motion abnormalities. The left ventricular internal cavity size was normal in size. There is no left ventricular hypertrophy. Left ventricular diastolic parameters are consistent with Grade I diastolic dysfunction (impaired relaxation). Normal left ventricular filling pressure.  Right Ventricle: The right  using a BALLOON SAPPHIRE 2.5X15. Maximum pressure:  12 atm. Inflation time: 20 sec. A drug-eluting stent was successfully placed using a SYNERGY XD 3.0X20. Maximum pressure: 14 atm. Inflation time: 30 sec. Minimum lumen area:  3.3 mm. Stent strut is well apposed. Post-stent angioplasty was performed. Maximum pressure:  20 atm. Inflation time:  20 sec. Stent balloon, high ATM Post-Intervention Lesion Assessment The intervention was successful. Pre-interventional TIMI flow is 3. Post-intervention TIMI flow is 3. Treated lesion length:  20 mm. There was a more proximal RCA focal lesion caused by the guide catheter -&gt; preparations were made for stenting of this lesion There is a 0% residual stenosis post intervention.     ECHOCARDIOGRAM  ECHOCARDIOGRAM COMPLETE 08/08/2021  Narrative ECHOCARDIOGRAM REPORT    Patient Name:   [name] Date of Exam: 08/08/2021 Medical Rec #:  [id number]    Height:       66.0 in Accession #:    [id number]   Weight:       151.0 lb Date of Birth:  [birth year]     BSA:          1.775 m Patient Age:    52 years     BP:           100/62  mmHg Patient Gender: M            HR:           71 bpm. [facility]  Procedure: 2D Echo, Cardiac Doppler and Color Doppler  Indications:    I25.5 (ICD-10-CM) - Ischemic cardiomyopathy  History:        Patient has prior history of Echocardiogram examinations, most recent 08/10/2019. Risk Factors:Hypertension, Diabetes and Dyslipidemia. ETOH, Seizure , Major depression, S/P percutaneous coronary angioplasty.  Sonographer:    Celesta Gentile RCS Referring Phys: [id number] JAMES HOCHREIN  IMPRESSIONS   1. Wall motion and LVEF evaluation limited by poor visualization. The proximal to mid anteroseptal wall is hypokinetic and the distal anteroseptal is akinetic. [name] Distal anterior wall and the enitre apex are akinetic. LVEF appears roughly around 30%. Consider limited contrast study for more accurate assessment. . Left ventricular ejection fraction, by estimation, is 30%. The left ventricle has moderately decreased function. The left ventricle demonstrates regional wall motion abnormalities (see scoring diagram/findings for description). Left ventricular diastolic parameters are consistent with Grade I diastolic dysfunction (impaired relaxation). 2. Right ventricular systolic function is normal. The right ventricular size is normal. Tricuspid regurgitation signal is inadequate for assessing PA pressure. 3. The mitral valve is normal in structure. Trivial mitral valve regurgitation. No evidence of mitral stenosis. 4. The aortic valve is tricuspid. Aortic valve regurgitation is not visualized. No aortic stenosis is present. 5. The inferior vena cava is dilated in size with <50% respiratory variability, suggesting right atrial pressure of 15 mmHg.  FINDINGS Left Ventricle: Wall motion and LVEF evaluation limited by poor visualization. The proximal to mid anteroseptal wall is hypokinetic and the distal anteroseptal is akinetic. [name] Distal anterior wall and the enitre apex are akinetic. LVEF  appears roughly around 30%. Consider limited contrast study for more accurate assessment. Left ventricular ejection fraction, by estimation, is 30%. The left ventricle has moderately decreased function. The left ventricle demonstrates regional wall motion abnormalities. The left ventricular internal cavity size was normal in size. There is no left ventricular hypertrophy. Left ventricular diastolic parameters are consistent with Grade I diastolic dysfunction (impaired relaxation). Normal left ventricular filling pressure.  Right Ventricle: The right  Cardiology Office Note:  .   Date:  01/11/2023  ID:  [name], DOB [birth year], MRN [id number] PCP: Gabriel Earing, FNP  [facility] Providers Cardiologist:  Rollene Rotunda, MD     History of Present Illness: .   Jayshon Ringenberg is a 52 y.o. male with PMH of CAD, ischemic cardiomyopathy, CKD stage III, hypertension, hyperlipidemia and DM2.  Patient had BMS to LAD in 2010.  He developed in-stent restenosis in 2018 treated with PCI.  Cardiac catheterization in 2021 showed patent LAD stent with 80% distal LAD lesion, 70% proximal RCA lesion, 85% mid RCA lesion.  RCA lesions treated with DES.  Echocardiogram in May 2021 showed EF 45%.  In the past, he also had problem with hyperkalemia, for this reason, Sherryll Burger was discontinued and he was restarted on losartan.  Last cardiac catheterization performed on 07/06/2021 demonstrated a 30% mid to distal LAD, 80% distal LAD lesion, 75% D1, 90% first septal perforator, patent stent in mid to proximal RCA, EF 25 to 35%.  Medical therapy was recommended.  Although diagonal lesion has worsened, however treatment would require going through the LAD stent struts.  Echocardiogram obtained on 08/08/2021 showed EF 30%, akinesis of the distal anterior wall and the entire apex, akinesis of anteroseptal wall, grade 1 DD, trivial MR.  His Imdur was titrated due to increased nitroglycerin usage.  He was last seen by Dr. Antoine Poche in August 2023, at which time, he has not used Imdur as it made him feel bad.   He was seen at [facility] ED due to syncope and seizure-like activity x 2 witnessed by wife. Event lasted roughly 30 minutes each.  Postictal confusion was noted.  He was given 1 g of Keppra.  He was subsequently transferred to [facility].  EEG was negative for seizure.  Patient was seen by neurology service who recommended Keppra 500 mg twice daily.  He was hypotensive on arrival.  Family states he has not been taking his GDMT for a while then started taking  all of them at once prior to hospitalization.  GDMT held due to hypotension.  It was recommended he slowly restart GDMT on discharge.  He was restarted on Imdur, carvedilol and Imdur held.  Echocardiogram obtained on 11/13/2022 demonstrated EF 30 to 35%, akinesis of the apical anterior and apical septal and apex location.  Since discharge, patient has been seen by family medicine service on 12/07/2022, he was not taking Keppra at the time as they made him feel drowsy and dizzy. He also has stopped taking all of his medications since discharge except for aspirin.   I last saw the patient on 12/17/2022, at which time the only medication he was taking of the aspirin.  The blood work obtained after his discharge showed that his creatinine was around 2.0.  I restarted on the Jardiance and the rosuvastatin.  I also restarted carvedilol at a lower dose of 3.125 mg twice a day.  Since the last visit, patient has came off of [place] and restarted on Farxiga.  Blood pressure continue to be mildly elevated.  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  His Keppra was further uptitrated to 750 mg twice a day.  According to his wife, he  using a BALLOON SAPPHIRE 2.5X15. Maximum pressure:  12 atm. Inflation time: 20 sec. A drug-eluting stent was successfully placed using a SYNERGY XD 3.0X20. Maximum pressure: 14 atm. Inflation time: 30 sec. Minimum lumen area:  3.3 mm. Stent strut is well apposed. Post-stent angioplasty was performed. Maximum pressure:  20 atm. Inflation time:  20 sec. Stent balloon, high ATM Post-Intervention Lesion Assessment The intervention was successful. Pre-interventional TIMI flow is 3. Post-intervention TIMI flow is 3. Treated lesion length:  20 mm. There was a more proximal RCA focal lesion caused by the guide catheter -&gt; preparations were made for stenting of this lesion There is a 0% residual stenosis post intervention.     ECHOCARDIOGRAM  ECHOCARDIOGRAM COMPLETE 08/08/2021  Narrative ECHOCARDIOGRAM REPORT    Patient Name:   [name] Date of Exam: 08/08/2021 Medical Rec #:  [id number]    Height:       66.0 in Accession #:    [id number]   Weight:       151.0 lb Date of Birth:  [birth year]     BSA:          1.775 m Patient Age:    52 years     BP:           100/62  mmHg Patient Gender: M            HR:           71 bpm. [facility]  Procedure: 2D Echo, Cardiac Doppler and Color Doppler  Indications:    I25.5 (ICD-10-CM) - Ischemic cardiomyopathy  History:        Patient has prior history of Echocardiogram examinations, most recent 08/10/2019. Risk Factors:Hypertension, Diabetes and Dyslipidemia. ETOH, Seizure , Major depression, S/P percutaneous coronary angioplasty.  Sonographer:    Celesta Gentile RCS Referring Phys: [id number] JAMES HOCHREIN  IMPRESSIONS   1. Wall motion and LVEF evaluation limited by poor visualization. The proximal to mid anteroseptal wall is hypokinetic and the distal anteroseptal is akinetic. [name] Distal anterior wall and the enitre apex are akinetic. LVEF appears roughly around 30%. Consider limited contrast study for more accurate assessment. . Left ventricular ejection fraction, by estimation, is 30%. The left ventricle has moderately decreased function. The left ventricle demonstrates regional wall motion abnormalities (see scoring diagram/findings for description). Left ventricular diastolic parameters are consistent with Grade I diastolic dysfunction (impaired relaxation). 2. Right ventricular systolic function is normal. The right ventricular size is normal. Tricuspid regurgitation signal is inadequate for assessing PA pressure. 3. The mitral valve is normal in structure. Trivial mitral valve regurgitation. No evidence of mitral stenosis. 4. The aortic valve is tricuspid. Aortic valve regurgitation is not visualized. No aortic stenosis is present. 5. The inferior vena cava is dilated in size with <50% respiratory variability, suggesting right atrial pressure of 15 mmHg.  FINDINGS Left Ventricle: Wall motion and LVEF evaluation limited by poor visualization. The proximal to mid anteroseptal wall is hypokinetic and the distal anteroseptal is akinetic. [name] Distal anterior wall and the enitre apex are akinetic. LVEF  appears roughly around 30%. Consider limited contrast study for more accurate assessment. Left ventricular ejection fraction, by estimation, is 30%. The left ventricle has moderately decreased function. The left ventricle demonstrates regional wall motion abnormalities. The left ventricular internal cavity size was normal in size. There is no left ventricular hypertrophy. Left ventricular diastolic parameters are consistent with Grade I diastolic dysfunction (impaired relaxation). Normal left ventricular filling pressure.  Right Ventricle: The right  using a BALLOON SAPPHIRE 2.5X15. Maximum pressure:  12 atm. Inflation time: 20 sec. A drug-eluting stent was successfully placed using a SYNERGY XD 3.0X20. Maximum pressure: 14 atm. Inflation time: 30 sec. Minimum lumen area:  3.3 mm. Stent strut is well apposed. Post-stent angioplasty was performed. Maximum pressure:  20 atm. Inflation time:  20 sec. Stent balloon, high ATM Post-Intervention Lesion Assessment The intervention was successful. Pre-interventional TIMI flow is 3. Post-intervention TIMI flow is 3. Treated lesion length:  20 mm. There was a more proximal RCA focal lesion caused by the guide catheter -&gt; preparations were made for stenting of this lesion There is a 0% residual stenosis post intervention.     ECHOCARDIOGRAM  ECHOCARDIOGRAM COMPLETE 08/08/2021  Narrative ECHOCARDIOGRAM REPORT    Patient Name:   [name] Date of Exam: 08/08/2021 Medical Rec #:  [id number]    Height:       66.0 in Accession #:    [id number]   Weight:       151.0 lb Date of Birth:  [birth year]     BSA:          1.775 m Patient Age:    52 years     BP:           100/62  mmHg Patient Gender: M            HR:           71 bpm. [facility]  Procedure: 2D Echo, Cardiac Doppler and Color Doppler  Indications:    I25.5 (ICD-10-CM) - Ischemic cardiomyopathy  History:        Patient has prior history of Echocardiogram examinations, most recent 08/10/2019. Risk Factors:Hypertension, Diabetes and Dyslipidemia. ETOH, Seizure , Major depression, S/P percutaneous coronary angioplasty.  Sonographer:    Celesta Gentile RCS Referring Phys: [id number] JAMES HOCHREIN  IMPRESSIONS   1. Wall motion and LVEF evaluation limited by poor visualization. The proximal to mid anteroseptal wall is hypokinetic and the distal anteroseptal is akinetic. [name] Distal anterior wall and the enitre apex are akinetic. LVEF appears roughly around 30%. Consider limited contrast study for more accurate assessment. . Left ventricular ejection fraction, by estimation, is 30%. The left ventricle has moderately decreased function. The left ventricle demonstrates regional wall motion abnormalities (see scoring diagram/findings for description). Left ventricular diastolic parameters are consistent with Grade I diastolic dysfunction (impaired relaxation). 2. Right ventricular systolic function is normal. The right ventricular size is normal. Tricuspid regurgitation signal is inadequate for assessing PA pressure. 3. The mitral valve is normal in structure. Trivial mitral valve regurgitation. No evidence of mitral stenosis. 4. The aortic valve is tricuspid. Aortic valve regurgitation is not visualized. No aortic stenosis is present. 5. The inferior vena cava is dilated in size with <50% respiratory variability, suggesting right atrial pressure of 15 mmHg.  FINDINGS Left Ventricle: Wall motion and LVEF evaluation limited by poor visualization. The proximal to mid anteroseptal wall is hypokinetic and the distal anteroseptal is akinetic. [name] Distal anterior wall and the enitre apex are akinetic. LVEF  appears roughly around 30%. Consider limited contrast study for more accurate assessment. Left ventricular ejection fraction, by estimation, is 30%. The left ventricle has moderately decreased function. The left ventricle demonstrates regional wall motion abnormalities. The left ventricular internal cavity size was normal in size. There is no left ventricular hypertrophy. Left ventricular diastolic parameters are consistent with Grade I diastolic dysfunction (impaired relaxation). Normal left ventricular filling pressure.  Right Ventricle: The right

## 2023-01-12 LAB — BASIC METABOLIC PANEL
BUN/Creatinine Ratio: 9 (ref 9–20)
BUN: 19 mg/dL (ref 6–24)
CO2: 17 mmol/L — ABNORMAL LOW (ref 20–29)
Calcium: 9.1 mg/dL (ref 8.7–10.2)
Chloride: 106 mmol/L (ref 96–106)
Creatinine, Ser: 2.16 mg/dL — ABNORMAL HIGH (ref 0.76–1.27)
Glucose: 144 mg/dL — ABNORMAL HIGH (ref 70–99)
Potassium: 5.4 mmol/L — ABNORMAL HIGH (ref 3.5–5.2)
Sodium: 138 mmol/L (ref 134–144)
eGFR: 36 mL/min/{1.73_m2} — ABNORMAL LOW (ref >59)

## 2023-01-14 ENCOUNTER — Telehealth: Payer: Self-pay

## 2023-01-14 DIAGNOSIS — Z79899 Other long term (current) drug therapy: Secondary | ICD-10-CM

## 2023-01-14 NOTE — Telephone Encounter (Addendum)
Called patient regarding results, patient had understanding of results, will mail patient copy of results and lab requisite for upcoming labs.  ----- Message from Azalee Course sent at 01/14/2023  1:09 PM EDT ----- Renal function remain stable, but not good enough for [place] to resume olmesartan. Let's increase carvedilol to 6.25mg  BID. Potassium level borderline, recommend repeat BMET in 1-2 weeks.

## 2023-01-18 ENCOUNTER — Encounter: Payer: Self-pay | Admitting: Cardiology

## 2023-01-18 NOTE — Telephone Encounter (Signed)
Error

## 2023-01-24 DIAGNOSIS — Z79899 Other long term (current) drug therapy: Secondary | ICD-10-CM | POA: Diagnosis not present

## 2023-01-28 ENCOUNTER — Telehealth: Payer: Self-pay | Admitting: Cardiology

## 2023-01-28 DIAGNOSIS — I129 Hypertensive chronic kidney disease with stage 1 through stage 4 chronic kidney disease, or unspecified chronic kidney disease: Secondary | ICD-10-CM | POA: Diagnosis not present

## 2023-01-28 DIAGNOSIS — E872 Acidosis, unspecified: Secondary | ICD-10-CM | POA: Diagnosis not present

## 2023-01-28 DIAGNOSIS — E1129 Type 2 diabetes mellitus with other diabetic kidney complication: Secondary | ICD-10-CM | POA: Diagnosis not present

## 2023-01-28 DIAGNOSIS — I255 Ischemic cardiomyopathy: Secondary | ICD-10-CM | POA: Diagnosis not present

## 2023-01-28 DIAGNOSIS — E875 Hyperkalemia: Secondary | ICD-10-CM | POA: Diagnosis not present

## 2023-01-28 DIAGNOSIS — N183 Chronic kidney disease, stage 3 unspecified: Secondary | ICD-10-CM | POA: Diagnosis not present

## 2023-01-28 NOTE — Telephone Encounter (Signed)
Patient's wife is calling stating the patient went in for the labs ordered by our office on 10/31 at [facility]. She is requesting a callback to discuss the results. Please advise.

## 2023-01-28 NOTE — Telephone Encounter (Signed)
Wife called to see if we have results to labs drawn in [place]. She is not on DPR . Patient will have to give [place] a call.  We do not have results

## 2023-01-29 ENCOUNTER — Other Ambulatory Visit: Payer: Self-pay | Admitting: Internal Medicine

## 2023-01-29 DIAGNOSIS — N183 Chronic kidney disease, stage 3 unspecified: Secondary | ICD-10-CM

## 2023-02-05 ENCOUNTER — Telehealth: Payer: Self-pay | Admitting: Cardiology

## 2023-02-05 DIAGNOSIS — E875 Hyperkalemia: Secondary | ICD-10-CM

## 2023-02-05 NOTE — Telephone Encounter (Signed)
Patient's spouse is requesting for [place] to send the lab orders to them by mail or send the lab order to [facility]. Please advise.

## 2023-02-05 NOTE — Telephone Encounter (Signed)
Patient identification verified by 2 forms. Kevin Rail, RN   Called and spoke to patients wife/Patient  Patient and wife states:   -received call last week that patients potassium was critically high   -received a prescription of Lokelma to take for 3 days, picked it up on 02/01/23  -unsure if need to repeat labs Informed patient/wife message sent for assistance

## 2023-02-07 NOTE — Telephone Encounter (Signed)
Received message from Rollene Rotunda, MD:  to [facility] Triage    02/07/23  7:51 AM Can we call and get a BMET ordered in [facility] with results sent to me.  Thank  __________________________________________________________________ Patient identification verified by 2 forms. Marilynn Rail, RN   Called and spoke to patients wife Gordan Payment provider message  Informed [name] lab orders placed, can present to [facility] facility to complete  [name] agrees, will have patient present today  [name] has no further questions at this time

## 2023-02-08 ENCOUNTER — Other Ambulatory Visit: Payer: Medicaid Other

## 2023-02-25 ENCOUNTER — Encounter: Payer: Self-pay | Admitting: Family Medicine

## 2023-02-25 ENCOUNTER — Ambulatory Visit: Payer: Medicaid Other | Admitting: Family Medicine

## 2023-02-25 VITALS — BP 156/91 | HR 90 | Temp 97.7°F | Ht 68.0 in | Wt 145.2 lb

## 2023-02-25 DIAGNOSIS — I25110 Atherosclerotic heart disease of native coronary artery with unstable angina pectoris: Secondary | ICD-10-CM | POA: Diagnosis not present

## 2023-02-25 DIAGNOSIS — Z9861 Coronary angioplasty status: Secondary | ICD-10-CM | POA: Diagnosis not present

## 2023-02-25 DIAGNOSIS — E1169 Type 2 diabetes mellitus with other specified complication: Secondary | ICD-10-CM | POA: Diagnosis not present

## 2023-02-25 DIAGNOSIS — E1159 Type 2 diabetes mellitus with other circulatory complications: Secondary | ICD-10-CM | POA: Diagnosis not present

## 2023-02-25 DIAGNOSIS — I2510 Atherosclerotic heart disease of native coronary artery without angina pectoris: Secondary | ICD-10-CM

## 2023-02-25 DIAGNOSIS — N183 Chronic kidney disease, stage 3 unspecified: Secondary | ICD-10-CM

## 2023-02-25 DIAGNOSIS — E1165 Type 2 diabetes mellitus with hyperglycemia: Secondary | ICD-10-CM | POA: Diagnosis not present

## 2023-02-25 DIAGNOSIS — I509 Heart failure, unspecified: Secondary | ICD-10-CM | POA: Diagnosis not present

## 2023-02-25 DIAGNOSIS — F419 Anxiety disorder, unspecified: Secondary | ICD-10-CM

## 2023-02-25 DIAGNOSIS — E785 Hyperlipidemia, unspecified: Secondary | ICD-10-CM | POA: Diagnosis not present

## 2023-02-25 DIAGNOSIS — F331 Major depressive disorder, recurrent, moderate: Secondary | ICD-10-CM

## 2023-02-25 DIAGNOSIS — I152 Hypertension secondary to endocrine disorders: Secondary | ICD-10-CM

## 2023-02-25 DIAGNOSIS — Z794 Long term (current) use of insulin: Secondary | ICD-10-CM | POA: Diagnosis not present

## 2023-02-25 LAB — BAYER DCA HB A1C WAIVED: HB A1C (BAYER DCA - WAIVED): 7.6 % — ABNORMAL HIGH (ref 4.8–5.6)

## 2023-02-25 MED ORDER — SITAGLIPTIN PHOSPHATE 100 MG PO TABS
100.0000 mg | ORAL_TABLET | Freq: Every day | ORAL | 3 refills | Status: DC
Start: 2023-02-25 — End: 2023-02-25

## 2023-02-25 MED ORDER — AMLODIPINE BESYLATE 5 MG PO TABS: 5.0000 mg | ORAL_TABLET | Freq: Every day | ORAL | 3 refills | Status: AC

## 2023-02-25 MED ORDER — SITAGLIPTIN PHOSPHATE 50 MG PO TABS
50.0000 mg | ORAL_TABLET | Freq: Every day | ORAL | 3 refills | Status: AC
Start: 2023-02-25 — End: ?

## 2023-02-25 NOTE — Progress Notes (Signed)
Established Patient Office Visit  Subjective   Patient ID: [name], male    DOB: [birth year]  Age: 52 y.o. MRN: [id number]  Chief Complaint  Patient presents with   Medical Management of Chronic Issues   Diabetes    HPI  T2DM Pt presents for follow up evaluation of Type 2 diabetes mellitus.  Current symptoms include paresthesia of the feet. Patient denies foot ulcerations, hypoglycemia , increased appetite, nausea, polydipsia, polyuria, visual disturbances, vomiting, and weight loss.  Current diabetic medications include: lantus 18 units at bedtime, lantus 10 units with meals on average, farxiga 10 mg Compliant with meds - Yes  Current monitoring regimen: home blood tests - daily 150 average Any episodes of hypoglycemia? no  2. HTN Complaint with meds - Yes Current Medications - coreg 3.125 BID, olmesartan- has been held by cardiology due to hyperkalemia Checking BP at home ranging: 140s/80s Pertinent ROS:  Headache - No Fatigue - No Visual Disturbances - No Chest pain - No Dyspnea - No Palpitations - No LE edema - No  3. HLD Last LDL was 124. Unsure if he was taking his satin at that point. He has been taking crestor daily since then.   4. CKD Established with nephrology. On farxiga. ARB held due to hyperkalemia currently.   5. Seizures Has 1 seizure since his last visit here. Now established with neurology. Neurology increased keppra- no seizures since higher dosage.   6. Depression/anxiety Reports well controlled with celexa.      02/25/2023   10:24 AM 05/22/2022   10:25 AM 05/05/2021    4:38 PM  Depression screen PHQ 2/9  Decreased Interest 0 0 0  Down, Depressed, Hopeless 0 0 0  PHQ - 2 Score 0 0 0  Altered sleeping 0 0 0  Tired, decreased energy 0 0 0  Change in appetite 0 0 0  Feeling bad or failure about yourself  0 0 0  Trouble concentrating 0 0 0  Moving slowly or fidgety/restless 0 0 0  Suicidal thoughts 0 0 0  PHQ-9 Score 0 0 0  Difficult  doing work/chores Not difficult at all Not difficult at all Not difficult at all      02/25/2023   10:23 AM 05/05/2021    4:38 PM 01/20/2021    1:11 PM 11/11/2020    1:05 PM  GAD 7 : Generalized Anxiety Score  Nervous, Anxious, on Edge 0 0 2 0  Control/stop worrying 0 0 0 0  Worry too much - different things 0 0 0 0  Trouble relaxing 0 0 0 0  Restless 0 0 0 0  Easily annoyed or irritable 2 2 3 1   Afraid - awful might happen 0 0 0 0  Total GAD 7 Score 2 2 5 1   Anxiety Difficulty Not difficult at all Somewhat difficult Not difficult at all Somewhat difficult     Past Medical History:  Diagnosis Date   Alcohol abuse    CAD (coronary artery disease), native coronary artery    a. 01/2009 s/p Anterior MI and thrombectomy with BMS to mid LAD;  b. 06/2016 Cath/PCI: LM nl, LAD 77m/d, LCX nl, OM1/2/3 nl, RDA 64m. Initial attempt @ PCI failed followed by successful CTO LAD PCI and DES (2.5x38 Synergy)-->rec for indefinte DAPT.   Chronic congestive heart failure (HCC) 12/07/2022   CKD (chronic kidney disease), stage III (HCC)    Essential hypertension    History of nonadherence to medical treatment    Hyperlipidemia  Ischemic cardiomyopathy    a. EF 40% by nuc in 2012;  b. 06/2016 Echo: EF 40-45%, apicalanteroseptal and apical AK, Gr2 DD, mod Ca2+ Ao annulus, mild MR.   Slurred speech - Chronic    Type 2 diabetes mellitus (HCC)       ROS As per HPI.    Objective:     BP (!) 156/91   Pulse 90   Temp 97.7 F (36.5 C) (Temporal)   Ht 5\' 8"  (1.727 m)   Wt 145 lb 4 oz (65.9 kg)   SpO2 99%   BMI 22.09 kg/m    Physical Exam Vitals and nursing note reviewed.  Constitutional:      General: He is not in acute distress.    Appearance: Normal appearance. He is not ill-appearing, toxic-appearing or diaphoretic.  HENT:     Head: Normocephalic and atraumatic.     Mouth/Throat:     Mouth: Mucous membranes are moist.     Pharynx: Oropharynx is clear.  Eyes:     Pupils: Pupils are  equal, round, and reactive to light.  Cardiovascular:     Rate and Rhythm: Normal rate and regular rhythm.     Heart sounds: Normal heart sounds. No murmur heard. Pulmonary:     Effort: Pulmonary effort is normal. No respiratory distress.     Breath sounds: Normal breath sounds. No wheezing.  Musculoskeletal:     Cervical back: Neck supple. No rigidity.     Right lower leg: No edema.     Left lower leg: No edema.  Skin:    General: Skin is warm and dry.  Neurological:     Mental Status: He is alert and oriented to person, place, and time. Mental status is at baseline.     Motor: No weakness.     Gait: Gait normal.  Psychiatric:        Mood and Affect: Mood normal.        Behavior: Behavior normal.      No results found for any visits on 02/25/23.    The ASCVD Risk score (Arnett DK, et al., 2019) failed to calculate for the following reasons:   The valid total cholesterol range is 130 to 320 mg/dL    Assessment & Plan:   [name] was seen today for medical management of chronic issues and diabetes.  Diagnoses and all orders for this visit:  Type 2 diabetes mellitus with hyperglycemia, with long-term current use of insulin (HCC) A1c 7.6 today, not goal of <7. Medication changes today: add januvia 50 mg due to CKD. Continue farxiga, lantus, novolog. ACE/ARB held currently due to hyperkalemia per cardiology. He has restarted on a statin. Eye exam: reminded to schedule. Foot exam: today. Urine micro: today. Diet and exercise.  -     Microalbumin / creatinine urine ratio -     Bayer DCA Hb A1c Waived -     sitaGLIPtin (JANUVIA) 50 MG tablet; Take 1 tablet (50 mg total) by mouth daily.  Hyperlipidemia associated with type 2 diabetes mellitus (HCC) Fasting panel pending. On statin.  -     Lipid panel  Hypertension associated with diabetes (HCC) Not at goal. Olmesartan has been held due to hyperkalemia. Start amlodipine. On coreg.  -     amLODipine (NORVASC) 5 MG tablet; Take 1  tablet (5 mg total) by mouth daily.  Chronic congestive heart failure, unspecified heart failure type [facility]) CAD S/P percutaneous coronary angioplasty Managed by cardiology. On coreg, imdur.  Atherosclerosis  of native coronary artery of native heart with unstable angina pectoris (HCC) On statin, aspirin.   Chronic kidney disease (CKD), active medical management without dialysis, stage 3 (moderate) (HCC) Established with nephrology. On farxiga. ARB held due to hyperkalemia.   Moderate episode of recurrent major depressive disorder (HCC) Anxiety Well controlled on current regimen.    Return in about 3 months (around 05/26/2023) for chronic follow up.   The patient indicates understanding of these issues and agrees with the plan.  Gabriel Earing, FNP

## 2023-02-26 ENCOUNTER — Telehealth: Payer: Self-pay

## 2023-02-26 LAB — LIPID PANEL
Chol/HDL Ratio: 4.5 {ratio} (ref 0.0–5.0)
Cholesterol, Total: 176 mg/dL (ref 100–199)
HDL: 39 mg/dL — ABNORMAL LOW (ref >39)
LDL Chol Calc (NIH): 110 mg/dL — ABNORMAL HIGH (ref 0–99)
Triglycerides: 150 mg/dL — ABNORMAL HIGH (ref 0–149)
VLDL Cholesterol Cal: 27 mg/dL (ref 5–40)

## 2023-02-26 NOTE — Telephone Encounter (Signed)
[name] ([name]) PA Case ID #: [id number] Rx #: [id number] Need Help? Call us at [phone] Status sent iconSent to Plan today Drug Januvia 50MG  tablets ePA cloud logo Form [facility] [place] [employer] [place] Form [phone] NCPDP) Original Claim Info [id number]

## 2023-02-27 ENCOUNTER — Other Ambulatory Visit (HOSPITAL_COMMUNITY): Payer: Self-pay | Admitting: Internal Medicine

## 2023-02-27 ENCOUNTER — Other Ambulatory Visit: Payer: Self-pay | Admitting: Family Medicine

## 2023-02-27 DIAGNOSIS — N183 Chronic kidney disease, stage 3 unspecified: Secondary | ICD-10-CM

## 2023-02-27 DIAGNOSIS — E785 Hyperlipidemia, unspecified: Secondary | ICD-10-CM

## 2023-02-27 LAB — MICROALBUMIN / CREATININE URINE RATIO
Creatinine, Urine: 101 mg/dL
Microalb/Creat Ratio: 34 mg/g{creat} — ABNORMAL HIGH (ref 0–29)
Microalbumin, Urine: 34.5 ug/mL

## 2023-02-27 MED ORDER — EZETIMIBE 10 MG PO TABS: 10.0000 mg | ORAL_TABLET | Freq: Every day | ORAL | 3 refills | Status: AC

## 2023-02-27 NOTE — Telephone Encounter (Signed)
Pharmacy Patient Advocate Encounter  Received notification from [facility] that Prior Authorization for Januvia 50MG  tablets has been APPROVED from 02/26/23 to 02/26/24   PA #/Case ID/Reference #: [id number]

## 2023-03-01 ENCOUNTER — Encounter (HOSPITAL_COMMUNITY): Payer: Self-pay

## 2023-03-01 ENCOUNTER — Ambulatory Visit (HOSPITAL_COMMUNITY): Admission: RE | Admit: 2023-03-01 | Payer: Medicaid Other | Source: Ambulatory Visit

## 2023-03-05 ENCOUNTER — Encounter: Payer: Self-pay | Admitting: *Deleted

## 2023-03-05 ENCOUNTER — Telehealth: Payer: Self-pay

## 2023-03-05 DIAGNOSIS — I255 Ischemic cardiomyopathy: Secondary | ICD-10-CM | POA: Diagnosis not present

## 2023-03-05 DIAGNOSIS — I129 Hypertensive chronic kidney disease with stage 1 through stage 4 chronic kidney disease, or unspecified chronic kidney disease: Secondary | ICD-10-CM | POA: Diagnosis not present

## 2023-03-05 DIAGNOSIS — E875 Hyperkalemia: Secondary | ICD-10-CM | POA: Diagnosis not present

## 2023-03-05 DIAGNOSIS — E1129 Type 2 diabetes mellitus with other diabetic kidney complication: Secondary | ICD-10-CM | POA: Diagnosis not present

## 2023-03-05 DIAGNOSIS — E872 Acidosis, unspecified: Secondary | ICD-10-CM | POA: Diagnosis not present

## 2023-03-05 DIAGNOSIS — N183 Chronic kidney disease, stage 3 unspecified: Secondary | ICD-10-CM | POA: Diagnosis not present

## 2023-03-05 NOTE — Telephone Encounter (Signed)
Just an FYI  Patient was scheduled for a sooner appointment tomorrow at   His son called in to advise that he had a seizure yesterday while they were driving to an appointment. EMS was called. Patients son stated that patient has stopped taking his Keppra due to it making him sleepy.

## 2023-03-06 ENCOUNTER — Ambulatory Visit: Payer: Medicaid Other | Admitting: Neurology

## 2023-03-06 ENCOUNTER — Encounter: Payer: Self-pay | Admitting: Neurology

## 2023-03-06 VITALS — BP 162/102 | HR 82 | Ht 66.0 in | Wt 149.0 lb

## 2023-03-06 DIAGNOSIS — N183 Chronic kidney disease, stage 3 unspecified: Secondary | ICD-10-CM

## 2023-03-06 DIAGNOSIS — Z794 Long term (current) use of insulin: Secondary | ICD-10-CM

## 2023-03-06 DIAGNOSIS — I152 Hypertension secondary to endocrine disorders: Secondary | ICD-10-CM

## 2023-03-06 DIAGNOSIS — E1159 Type 2 diabetes mellitus with other circulatory complications: Secondary | ICD-10-CM | POA: Diagnosis not present

## 2023-03-06 DIAGNOSIS — R569 Unspecified convulsions: Secondary | ICD-10-CM | POA: Diagnosis not present

## 2023-03-06 MED ORDER — LAMOTRIGINE 100 MG PO TABS: 100.0000 mg | ORAL_TABLET | Freq: Two times a day (BID) | ORAL | 5 refills | Status: DC

## 2023-03-06 NOTE — Progress Notes (Signed)
[facility]  PATIENT: [name] DOB: 05-12-1970  REFERRING DOCTOR OR PCP:  Harlow Mares, FNP SOURCE: Patient, son, notes from recent hospitalization, imaging and lab reports, MRI and CT scan images reviewed.  _________________________________   HISTORICAL  CHIEF COMPLAINT:  Chief Complaint  Patient presents with   Follow-up    Pt in room 11, son and granddaughter in room. Pt here for recent seizure yesterday, was breathing, but no responsive. EMS was called and pt was alert when they arrived. stopped Keppra because it makes him sleepy greater than 1 month. Patient still works.  Pt blood pressure elevated x 2 with manual recheck. Pt said he did not take blood pressure medication today.     HISTORY OF PRESENT ILLNESS:  Roxie Arb is a 52 y.o. man with seizures.  Update 03/06/2023  He continued to have some seizures on levetiracetam 500 mg p.o. twice daily at and I increased him to 750 p.o. twice daily.  However, the higher dose made him sleepy and more off balance.  He stopped the medication.  He has had one GTC spell witnessed by his son recently, since stopping.[name]  He told his son, he felt strange right before the spell.     He was unresponsive for a few minutes and then was groggy a few more minutes.     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  It was not present on MRI scan in 2021 or in 2022 on the CT scan.  He also has chronic microvascular ischemic change.  He has multiple medical issues including kidney disease, diabetes, hypertension.  Blood pressure was elevated today.  Of note, he skips his blood pressure medication at times.  He did not take his amlodipine this morning.   H/O seizures He had his first convulsive seizure in 2021.  At the time he was taken to the emergency room.  CT and MRI showed no  acute findings.  By my review, he did have more than typical white matter changes consistent with chronic microvascular ischemic change on the MRI.  Since then he has had frequent spells of staring spell during the day.  Additionally, he has had frequent convulsive episode right before he goes to sleep while laying in bed.  These last about one minute.    Convulsive episodes are very rare at any other time.  He usually groggy for a minute or 2 after the spell and then becomes alert again.  He had an episode of LOC, followed by gasping and respiratory arrest 11/14/2022.   CPR was initiated and he was taken to [facility] and then transferred to [facility].  The 3-4 hour EEG/video was normal 11/14/2022 showing wakefulness and drowsiness but no seizures.     According to his notes, he was reported to be taking his outpatient medications irregularly at the time.     Other: He has CHF and is on Imdur, Losartan and Coreg.  EF% is 30-35%.  He also has CAD, NIDDM and Stge 3 CRF  REVIEW OF SYSTEMS: Constitutional: No fevers, chills, sweats, or change in appetite Eyes: No visual changes, double vision, eye pain Ear, nose and throat: No hearing loss, ear pain, nasal congestion, sore throat Cardiovascular: He has CHF.  He has angina. Respiratory:  No shortness of breath at rest or with exertion.  No wheezes GastrointestinaI: No nausea, vomiting, diarrhea, abdominal pain, fecal incontinence Genitourinary:  No dysuria, urinary retention or frequency.  No nocturia.  He has stage III chronic renal failure. Musculoskeletal:  No neck pain, back pain Integumentary: No rash, pruritus, skin lesions Neurological: as above Psychiatric: No depression at this time.  No anxiety Endocrine: No palpitations, diaphoresis, change in appetite, change in weigh or increased thirst Hematologic/Lymphatic:  No anemia, purpura, petechiae. Allergic/Immunologic: No itchy/runny eyes, nasal congestion, recent allergic  reactions, rashes  ALLERGIES: Allergies  Allergen Reactions   Penicillins Rash         HOME MEDICATIONS:  Current Outpatient Medications:    Accu-Chek Softclix Lancets lancets, Test BS QID Dx E11.9, Disp: 400 each, Rfl: 3   albuterol (VENTOLIN HFA) 108 (90 Base) MCG/ACT inhaler, Inhale 1-2 puffs into the lungs every 6 (six) hours as needed., Disp: , Rfl:    amLODipine (NORVASC) 5 MG tablet, Take 1 tablet (5 mg total) by mouth daily., Disp: 90 tablet, Rfl: 3   aspirin 81 MG chewable tablet, Chew 1 tablet (81 mg total) by mouth daily with breakfast., Disp: 120 tablet, Rfl: 2   carvedilol (COREG) 3.125 MG tablet, Take 1 tablet (3.125 mg total) by mouth 2 (two) times daily., Disp: 180 tablet, Rfl: 3   citalopram (CELEXA) 10 MG tablet, Take 1 tablet (10 mg total) by mouth daily., Disp: 90 tablet, Rfl: 1   Continuous Blood Gluc Receiver (FREESTYLE LIBRE 2 READER) DEVI, 1 each by Does not apply route daily., Disp: 1 each, Rfl: 0   Continuous Blood Gluc Sensor (FREESTYLE LIBRE 2 SENSOR) MISC, 1 each by Does not apply route every 14 (fourteen) days., Disp: 2 each, Rfl: 2   dapagliflozin propanediol (FARXIGA) 10 MG TABS tablet, Take 10 mg by mouth daily., Disp: , Rfl:    ezetimibe (ZETIA) 10 MG tablet, Take 1 tablet (10 mg total) by mouth daily., Disp: 90 tablet, Rfl: 3   fluticasone (FLONASE) 50 MCG/ACT nasal spray, Place 2 sprays into both nostrils daily., Disp: 16 g, Rfl: 6   gabapentin (NEURONTIN) 300 MG capsule, TAKE ONE CAPSULE BY MOUTH THREE TIMES DAILY, Disp: 180 capsule, Rfl: 0   glucose blood (ACCU-CHEK GUIDE) test strip, Test BS QID Dx E11.9, Disp: 400 each, Rfl: 3   insulin aspart (NOVOLOG) 100 UNIT/ML FlexPen, Inject 0-10 Units into the skin See admin instructions. insulin aspart (novoLOG) injection 0-10 Units 0-10 Units Subcutaneous, 3 times daily with meals CBG < 70: Implement Hypoglycemia Standing Orders and refer to Hypoglycemia Standing Orders sidebar report  CBG 70 - 120: 0 unit  CBG 121 - 150: 0 unit  CBG 151 - 200: 1 unit CBG 201 - 250: 2 units CBG 251 - 300: 4 units CBG 301 - 350: 6 units  CBG 351 - 400: 8 units  CBG > 400: 10 units, Disp: 15 mL, Rfl: 0   insulin glargine (LANTUS) 100 UNIT/ML injection, Inject 0.18 mLs (18 Units total) into the skin at bedtime., Disp: 10 mL, Rfl: 0   Insulin Pen Needle (PEN NEEDLES) 32G X 5 MM MISC, Use with Novolog Flexpen as directed, Disp: 100 each, Rfl: 3   Insulin Syringe-Needle U-100 (TRUEPLUS INSULIN SYRINGE) 31G X 5/16" 1 ML MISC, Use to inject insulin once daily Dx E11.8, Disp: 100 each, Rfl: 3   isosorbide mononitrate (IMDUR) 120 MG 24 hr tablet, Take 120 mg by mouth daily., Disp: , Rfl:    lamoTRIgine (LAMICTAL) 100 MG tablet, Take 1 tablet (100 mg total)  by mouth 2 (two) times daily., Disp: 60 tablet, Rfl: 5   levocetirizine (XYZAL) 5 MG tablet, Take 1 tablet (5 mg total) by mouth every evening., Disp: 30 tablet, Rfl: 2   nitroGLYCERIN (NITROSTAT) 0.4 MG SL tablet, Place 1 tablet (0.4 mg total) under the tongue every 5 (five) minutes as needed for chest pain., Disp: 25 tablet, Rfl: 4   olmesartan (BENICAR) 40 MG tablet, Take 1 tablet (40 mg total) by mouth daily., Disp: 90 tablet, Rfl: 3   rosuvastatin (CRESTOR) 40 MG tablet, Take 1 tablet (40 mg total) by mouth daily., Disp: 90 tablet, Rfl: 3   sitaGLIPtin (JANUVIA) 50 MG tablet, Take 1 tablet (50 mg total) by mouth daily., Disp: 90 tablet, Rfl: 3   levETIRAcetam (KEPPRA) 750 MG tablet, Take 1 tablet (750 mg total) by mouth 2 (two) times daily. (Patient not taking: Reported on 03/06/2023), Disp: 60 tablet, Rfl: 11  PAST MEDICAL HISTORY: Past Medical History:  Diagnosis Date   Alcohol abuse    CAD (coronary artery disease), native coronary artery    a. 01/2009 s/p Anterior MI and thrombectomy with BMS to mid LAD;  b. 06/2016 Cath/PCI: LM nl, LAD 42m/d, LCX nl, OM1/2/3 nl, RDA 30m. Initial attempt @ PCI failed followed by successful CTO LAD PCI and DES (2.5x38 Synergy)-->rec  for indefinte DAPT.   Chronic congestive heart failure (HCC) 12/07/2022   CKD (chronic kidney disease), stage III (HCC)    Essential hypertension    History of nonadherence to medical treatment    Hyperlipidemia    Ischemic cardiomyopathy    a. EF 40% by nuc in 2012;  b. 06/2016 Echo: EF 40-45%, apicalanteroseptal and apical AK, Gr2 DD, mod Ca2+ Ao annulus, mild MR.   Slurred speech - Chronic    Type 2 diabetes mellitus (HCC)     PAST SURGICAL HISTORY: Past Surgical History:  Procedure Laterality Date   CORONARY CTO INTERVENTION N/A 07/11/2016   Procedure: Coronary CTO Intervention;  Surgeon: Peter M Swaziland, MD;  Location: [facility] INVASIVE CV LAB;  Service: Cardiovascular;  Laterality: N/A;   CORONARY STENT INTERVENTION N/A 07/09/2016   Procedure: Coronary Stent Intervention;  Surgeon: Runell Gess, MD;  Location: [facility] INVASIVE CV LAB;  Service: Cardiovascular;  Laterality: N/A;   CORONARY STENT INTERVENTION N/A 04/24/2019   Procedure: CORONARY STENT INTERVENTION;  Surgeon: Marykay Lex, MD;  Location: [facility] INVASIVE CV LAB;  Service: Cardiovascular;  Laterality: N/A;   Knee arthroscopic surgery Right    LEFT HEART CATH AND CORONARY ANGIOGRAPHY N/A 07/05/2016   Procedure: Left Heart Cath and Coronary Angiography;  Surgeon: Lyn Records, MD;  Location: [facility] INVASIVE CV LAB;  Service: Cardiovascular;  Laterality: N/A;   LEFT HEART CATH AND CORONARY ANGIOGRAPHY N/A 04/24/2019   Procedure: LEFT HEART CATH AND CORONARY ANGIOGRAPHY;  Surgeon: Marykay Lex, MD;  Location: [facility] INVASIVE CV LAB;  Service: Cardiovascular;  Laterality: N/A;   LEFT HEART CATH AND CORONARY ANGIOGRAPHY N/A 07/06/2021   Procedure: LEFT HEART CATH AND CORONARY ANGIOGRAPHY;  Surgeon: Swaziland, Peter M, MD;  Location: [facility] INVASIVE CV LAB;  Service: Cardiovascular;  Laterality: N/A;    FAMILY HISTORY: Family History  Problem Relation Age of Onset   Diabetes Mellitus II Other    Heart disease Mother     SOCIAL HISTORY: Social  History   Socioeconomic History   Marital status: Married    Spouse name: Not on file   Number of children: Not on file   Years of education:  Not on file   Highest education level: Not on file  Occupational History   Not on file  Tobacco Use   Smoking status: Never   Smokeless tobacco: Never  Vaping Use   Vaping status: Never Used  Substance and Sexual Activity   Alcohol use: No    Alcohol/week: 1.0 standard drink of alcohol    Types: 1 Cans of beer per week   Drug use: No   Sexual activity: Yes  Other Topics Concern   Not on file  Social History Narrative   Married with one child   Lives in [place], [place]   Right Handed   One story home   Drinks some caffeine (Green Tea)         Right handed    Drinks coffee sometimes    Drinks sodas one per day   Social Determinants of Health   Financial Resource Strain: Not on file  Food Insecurity: Not on file  Transportation Needs: No Transportation Needs (11/14/2022)   Received from [facility] - Transportation    Lack of Transportation (Medical): No    Lack of Transportation (Non-Medical): No  Physical Activity: Not on file  Stress: No Stress Concern Present (11/13/2022)   Received from [facility] - Occupational Stress Questionnaire    Feeling of Stress : Not at all  Social Connections: Unknown (11/12/2022)   Received from [facility]   Social Network    Social Network: Not on file  Intimate Partner Violence: Not At Risk (11/13/2022)   Received from [facility]   HITS    Over the last 12 months how often did your partner physically hurt you?: Never    Over the last 12 months how often did your partner insult you or talk down to you?: Never    Over the last 12 months how often did your partner threaten you with physical harm?: Never    Over the last 12 months how often did your partner scream or curse at you?: Never       PHYSICAL EXAM  Vitals:   03/06/23  1558 03/06/23 1606  BP: (!) 177/114 (!) 162/102  Pulse: 82   Weight: 149 lb (67.6 kg)   Height: 5\' 6"  (1.676 m)     Body mass index is 24.05 kg/m.  Repeat BP 178/112  Right arm B   General: The patient is well-developed and well-nourished man in no acute distress  HEENT:  Head is [place]/AT.  Sclera are anicteric.   Skin: Extremities are without rash or  edema.  Musculoskeletal:  Back is nontender  Neurologic Exam  Mental status: The patient is alert and oriented.  Stuttering slurred speech.    Cranial nerves: Extraocular movements are full.   Facial symmetry is present. There is good facial sensation to soft touch bilaterally.Facial strength is normal.  Trapezius and sternocleidomastoid strength is normal. No dysarthria is noted.   No obvious hearing deficits are noted.  Motor:  Muscle bulk is normal.   Tone is normal. Strength is  5 / 5 in all 4 extremities.   Sensory: Sensory testing is intact to pinprick, soft touch and vibration sensation in all 4 extremities.  Coordination: Cerebellar testing reveals good finger-nose-finger and heel-to-shin bilaterally.  Gait and station: Station is normal.   Gait is normal. Tandem gait is mildly wide. Romberg is negative.   Reflexes: Deep tendon reflexes are symmetric and normal bilaterally.  DIAGNOSTIC DATA (LABS, IMAGING, TESTING) - I reviewed patient records, labs, notes, testing and imaging myself where available.  Lab Results  Component Value Date   WBC 8.9 12/07/2022   HGB 14.8 12/07/2022   HCT 45.3 12/07/2022   MCV 85 12/07/2022   PLT 227 12/07/2022      Component Value Date/Time   NA 138 01/11/2023 1511   K 5.4 (H) 01/11/2023 1511   CL 106 01/11/2023 1511   CO2 17 (L) 01/11/2023 1511   GLUCOSE 144 (H) 01/11/2023 1511   GLUCOSE 158 (H) 09/12/2019 1636   BUN 19 01/11/2023 1511   CREATININE 2.16 (H) 01/11/2023 1511   CREATININE 1.79 (H) 05/19/2019 1357   CALCIUM 9.1 01/11/2023 1511   PROT 7.1 12/07/2022 0958    ALBUMIN 4.6 12/07/2022 0958   AST 18 12/07/2022 0958   ALT 14 12/07/2022 0958   ALKPHOS 94 12/07/2022 0958   BILITOT 0.5 12/07/2022 0958   GFRNONAA 44 (L) 05/10/2020 0927   GFRNONAA 44 (L) 05/19/2019 1357   GFRAA 51 (L) 05/10/2020 0927   GFRAA 51 (L) 05/19/2019 1357   Lab Results  Component Value Date   CHOL 176 02/25/2023   HDL 39 (L) 02/25/2023   LDLCALC 110 (H) 02/25/2023   TRIG 150 (H) 02/25/2023   CHOLHDL 4.5 02/25/2023   Lab Results  Component Value Date   HGBA1C 7.6 (H) 02/25/2023   No results found for: "VITAMINB12" Lab Results  Component Value Date   TSH 3.340 12/07/2022       ASSESSMENT AND PLAN  Seizures (HCC)  Hypertension associated with diabetes (HCC)  Chronic kidney disease (CKD), active medical management without dialysis, stage 3 (moderate) (HCC)     Initiate lamotrigine and titrate up to 100 mg p.o. twice daily.   Blood pressure was markedly elevated.  He did not take his amlodipine this morning.  He is advised to contact his kidney doctor or primary care.  Additionally he needs to take the amlodipine as soon as he gets home.  They do have a blood pressure cuff and he is advised to check the blood pressure regularly and discuss further with the doctor that is prescribing his BP meds If continues to have spells while on medication, will check 48 hour ambulatory EEG Rtc 6 months or sooner if there are new or worsening neurologic symptoms.  Thank you for asked me to see Mr. Milburn.  Please let me know if I can be of further assistance with him or other patients in the future. Jamine Wingate A. Epimenio Foot, MD, [facility] 03/06/2023, 4:48 PM Certified in Neurology, Clinical Neurophysiology, Sleep Medicine and Neuroimaging  [facility] [address] [place], [place] [address] [phone]

## 2023-03-06 NOTE — Patient Instructions (Signed)
The pharmacy has the prescription for lamotrigine 100 mg tablets. For 5 days, just take one half pill a day. For the next 5 days, take one half pill twice a day. For the next 5 days, take one half pill 3 times a day Then start taking one pill twice a day from this point on.      If you get a rash, need to stop the medication and not take it again. 

## 2023-03-14 NOTE — Progress Notes (Unsigned)
Cardiology Office Note:   Date:  03/16/2023  ID:  [name], DOB 1971-03-15, MRN [id number] PCP: Gabriel Earing, FNP  [facility] Providers Cardiologist:  Rollene Rotunda, MD {  History of Present Illness:   [name] is a 52 y.o. male who presents with a hx of coronary disease.  He had an LAD BMS in 2010.  He had in-stent restenosis in 2018 treated with PCI.  Catheterization in January 2021 showed a patent LAD stent with an 80% distal LAD.  He had a 70% proximal RCA and an 85% mid RCA, the RCA was treated with PCI and DES.  The patient also has several other medical issues.  He has insulin-dependent diabetes.  He has treated dyslipidemia.  He has chronic renal insufficiency 3 with a GFR 44.  There is a history of seizures.   His last ejection fraction was 45% by echocardiogram May 2021.  In the past he has had problems with hyperkalemia.  He was on Entresto at one point and this was stopped and he was restarted on Losartan.  He was having increased chest pain.  I sent him for a cath and he had disease as below. He was managed medically.   Of note his ejection fraction was said to be 25%.  His end-diastolic pressure was low.  He was seen at [facility] ED due to syncope and seizure-like activity x 2 witnessed by wife. Event lasted roughly 30 minutes each.  Postictal confusion was noted.  He was given 1 g of Keppra.  He was subsequently transferred to [facility].  EEG was negative for seizure.  Patient was seen by neurology service who recommended Keppra 500 mg twice daily.  He was hypotensive on arrival.  Family states he has not been taking his GDMT for a while then started taking all of them at once prior to hospitalization.  GDMT held due to hypotension.  It was recommended he slowly restart GDMT on discharge.  He was restarted on Imdur, carvedilol and Imdur held.  Echocardiogram obtained on 11/13/2022 demonstrated EF 30 to 35%, akinesis of the apical anterior and apical septal  and apex location.  Since discharge, patient has been seen by family medicine service on 12/07/2022, he was not taking Keppra at the time as they made him feel drowsy and dizzy.   He has been non compliant with meds in the past.  I think currently that he is taking the meds as listed but it is difficult to know because he does not bring the meds.  The patient denies any new symptoms such as chest discomfort, neck or arm discomfort. There has been no new shortness of breath, PND or orthopnea. There have been no reported palpitations, presyncope or syncope.  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  He is here with his son.     ROS: As stated in the HPI and negative for all other systems.  Studies Reviewed:    EKG:   NA  Risk Assessment/Calculations:              Physical Exam:   VS:  BP 120/86   Pulse 80   Ht 5\' 9"  (1.753 m)   Wt 148 lb (67.1 kg)   SpO2 99%   BMI 21.86 kg/m    Wt Readings from Last 3 Encounters:  03/15/23 148 lb (67.1 kg)  03/06/23 149 lb (67.6 kg)  02/25/23 145 lb 4 oz (65.9 kg)  GEN: Well nourished, well developed in no acute distress NECK: No JVD; No carotid bruits CARDIAC: RRR, no murmurs, rubs, gallops RESPIRATORY:  Clear to auscultation without rales, wheezing or rhonchi  ABDOMEN: Soft, non-tender, non-distended EXTREMITIES:  No edema; No deformity   ASSESSMENT AND PLAN:   CAD:   The patient has no new sypmtoms.  No further cardiovascular testing is indicated.  We will continue with aggressive risk reduction and meds as listed.  I reviewed the films with him in the office today and explained that he has branch and small vessel disease with all of the stents and he is at risk for events if he is non compliant.    Ischemic cardiomyopathy:   It is very hard to titrate his meds because he does not take them.    Baseline EF 30 to 35% based on recent echocardiogram.  He seems to be euvolemic.  I will not titrate meds since he is not taking them as  prescribed.    Seizure: He is going to see neurology.  He says that he will not take the Keppra because it makes him timred.   CKD stage IIIb: Creat is up slightly and renal ultrasound was ordered.  I will defer to PCP and nephrology.    Hypertension:  The blood pressure is at target. No change in medications is indicated. We will continue with therapeutic lifestyle changes (TLC).   Hyperlipidemia: LDL is 110.  I don't need to adjust the meds.  He needs to take the meds prescribed.    On rosuvastatin   DM2: PCP.  A1c is 7.5     Follow up follow up with me in six months.   Signed, Rollene Rotunda, MD

## 2023-03-15 ENCOUNTER — Ambulatory Visit: Payer: Medicaid Other | Attending: Cardiology | Admitting: Cardiology

## 2023-03-15 ENCOUNTER — Encounter: Payer: Self-pay | Admitting: Cardiology

## 2023-03-15 VITALS — BP 120/86 | HR 80 | Ht 69.0 in | Wt 148.0 lb

## 2023-03-15 DIAGNOSIS — N1832 Chronic kidney disease, stage 3b: Secondary | ICD-10-CM | POA: Diagnosis not present

## 2023-03-15 DIAGNOSIS — E785 Hyperlipidemia, unspecified: Secondary | ICD-10-CM | POA: Diagnosis not present

## 2023-03-15 DIAGNOSIS — I25119 Atherosclerotic heart disease of native coronary artery with unspecified angina pectoris: Secondary | ICD-10-CM

## 2023-03-15 DIAGNOSIS — I255 Ischemic cardiomyopathy: Secondary | ICD-10-CM

## 2023-03-15 DIAGNOSIS — I10 Essential (primary) hypertension: Secondary | ICD-10-CM | POA: Diagnosis not present

## 2023-03-15 DIAGNOSIS — E118 Type 2 diabetes mellitus with unspecified complications: Secondary | ICD-10-CM

## 2023-03-15 NOTE — Patient Instructions (Signed)
Medication Instructions:  No changes.  *If you need a refill on your cardiac medications before your next appointment, please call your pharmacy*  Follow-Up: At [facility], you and your health needs are our priority.  As part of our continuing mission to provide you with exceptional heart care, we have created designated Provider Care Teams.  These Care Teams include your primary Cardiologist (physician) and Advanced Practice Providers (APPs -  Physician Assistants and Nurse Practitioners) who all work together to provide you with the care you need, when you need it.  Your next appointment:   6 month(s) in [place] office.   Provider:   Rollene Rotunda, MD

## 2023-03-16 ENCOUNTER — Encounter: Payer: Self-pay | Admitting: Cardiology

## 2023-03-21 ENCOUNTER — Telehealth: Payer: Self-pay | Admitting: Family Medicine

## 2023-03-21 NOTE — Telephone Encounter (Signed)
Copied from CRM [id number]. Topic: General - Other >> Mar 21, 2023 12:13 PM Donita Brooks wrote: Reason for CRM: pt would like for a DNR to be sign

## 2023-03-25 NOTE — Telephone Encounter (Signed)
Pt aware of provider feedback and voiced understanding. 

## 2023-03-25 NOTE — Telephone Encounter (Signed)
He can come by to sign the form.

## 2023-04-30 ENCOUNTER — Ambulatory Visit (HOSPITAL_COMMUNITY)
Admission: RE | Admit: 2023-04-30 | Discharge: 2023-04-30 | Disposition: A | Discharge status: Home / Self Care | Payer: Medicaid Other | Source: Ambulatory Visit | Attending: Internal Medicine | Admitting: Internal Medicine

## 2023-04-30 DIAGNOSIS — N183 Chronic kidney disease, stage 3 unspecified: Secondary | ICD-10-CM | POA: Diagnosis not present

## 2023-04-30 DIAGNOSIS — N189 Chronic kidney disease, unspecified: Secondary | ICD-10-CM | POA: Diagnosis not present

## 2023-05-27 ENCOUNTER — Ambulatory Visit: Payer: Medicaid Other | Admitting: Family Medicine

## 2023-06-27 ENCOUNTER — Ambulatory Visit: Payer: Medicaid Other | Admitting: Neurology

## 2023-07-25 ENCOUNTER — Ambulatory Visit: Admitting: Family Medicine

## 2023-07-25 ENCOUNTER — Encounter: Payer: Self-pay | Admitting: Family Medicine

## 2023-08-06 DIAGNOSIS — S0181XA Laceration without foreign body of other part of head, initial encounter: Secondary | ICD-10-CM | POA: Diagnosis not present

## 2023-08-06 DIAGNOSIS — Z6822 Body mass index (BMI) 22.0-22.9, adult: Secondary | ICD-10-CM | POA: Diagnosis not present

## 2023-08-06 DIAGNOSIS — R030 Elevated blood-pressure reading, without diagnosis of hypertension: Secondary | ICD-10-CM | POA: Diagnosis not present

## 2023-08-26 NOTE — Progress Notes (Deleted)
 Cardiology Office Note:   Date:  08/26/2023  ID:  [name], DOB 03-26-1971, MRN [id number] PCP: Albertha Huger, FNP  [facility] Providers Cardiologist:  Eilleen Grates, MD {  History of Present Illness:   [name] is a 53 y.o. male who presents with a hx of coronary disease.  He had an LAD BMS in 2010.  He had in-stent restenosis in 2018 treated with PCI.  Catheterization in January 2021 showed a patent LAD stent with an 80% distal LAD.  He had a 70% proximal RCA and an 85% mid RCA, the RCA was treated with PCI and DES.  The patient also has several other medical issues.  He has insulin -dependent diabetes.  He has treated dyslipidemia.  He has chronic renal insufficiency 3 with a GFR 44.  There is a history of seizures.   His last ejection fraction was 45% by echocardiogram May 2021.  In the past he has had problems with hyperkalemia.  He was on Entresto  at one point and this was stopped and he was restarted on Losartan .  He was having increased chest pain.  I sent him for a cath and he had disease as below. He was managed medically.   Of note his ejection fraction was said to be 25%.  His end-diastolic pressure was low.   He was seen at [facility] ED due to syncope and seizure-like activity x 2 witnessed by wife. Event lasted roughly 30 minutes each.  Postictal confusion was noted.  He was given 1 g of Keppra .  He was subsequently transferred to [facility].  EEG was negative for seizure.  Patient was seen by neurology service who recommended Keppra  500 mg twice daily.    He presents for follow up ***  ***  He was hypotensive on arrival.  Family states he has not been taking his GDMT for a while then started taking all of them at once prior to hospitalization.  GDMT held due to hypotension.  It was recommended he slowly restart GDMT on discharge.  He was restarted on Imdur , carvedilol  and Imdur  held.  Echocardiogram obtained on 11/13/2022 demonstrated EF 30 to 35%, akinesis  of the apical anterior and apical septal and apex location.  Since discharge, patient has been seen by family medicine service on 12/07/2022, he was not taking Keppra  at the time as they made him feel drowsy and dizzy.    He has been non compliant with meds in the past.  I think currently that he is taking the meds as listed but it is difficult to know because he does not bring the meds.  The patient denies any new symptoms such as chest discomfort, neck or arm discomfort. There has been no new shortness of breath, PND or orthopnea. There have been no reported palpitations, presyncope or syncope.  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  He is here with his son.     ROS: ***  Studies Reviewed:    EKG:       ***  Risk Assessment/Calculations:   {Does this patient have ATRIAL FIBRILLATION?[phone]} No BP recorded.  {Refresh Note OR Click here to enter BP  :1}***        Physical Exam:   VS:  There were no vitals taken for this visit.   Wt Readings from Last 3 Encounters:  03/15/23 148 lb (67.1 kg)  03/06/23 149 lb (67.6 kg)  02/25/23 145 lb 4 oz (65.9 kg)  GEN: Well nourished, well developed in no acute distress NECK: No JVD; No carotid bruits CARDIAC: ***RR, *** murmurs, rubs, gallops RESPIRATORY:  Clear to auscultation without rales, wheezing or rhonchi  ABDOMEN: Soft, non-tender, non-distended EXTREMITIES:  No edema; No deformity   ASSESSMENT AND PLAN:   CAD:  ***   The patient has no new sypmtoms.  No further cardiovascular testing is indicated.  We will continue with aggressive risk reduction and meds as listed.  I reviewed the films with him in the office today and explained that he has branch and small vessel disease with all of the stents and he is at risk for events if he is non compliant.     Ischemic cardiomyopathy:   ***   It is very hard to titrate his meds because he does not take them.    Baseline EF 30 to 35% based on recent echocardiogram.  He seems to  be euvolemic.  I will not titrate meds since he is not taking them as prescribed.     Seizure:  ***  He is going to see neurology.  He says that he will not take the Keppra  because it makes him timred.    CKD stage IIIb: Creat is ***  up slightly and renal ultrasound was ordered.  I will defer to PCP and nephrology.    Hypertension:  The blood pressure is ***  at target. No change in medications is indicated. We will continue with therapeutic lifestyle changes (TLC).   Hyperlipidemia: LDL is *** 110.  I don't need to adjust the meds.  He needs to take the meds prescribed.    On rosuvastatin    DM2:  ***  PCP.  A1c is 7.5     Follow up ***  Signed, Eilleen Grates, MD

## 2023-08-28 ENCOUNTER — Ambulatory Visit: Payer: Medicaid Other | Admitting: Cardiology

## 2023-08-28 DIAGNOSIS — N1832 Chronic kidney disease, stage 3b: Secondary | ICD-10-CM

## 2023-08-28 DIAGNOSIS — I10 Essential (primary) hypertension: Secondary | ICD-10-CM

## 2023-08-28 DIAGNOSIS — I25119 Atherosclerotic heart disease of native coronary artery with unspecified angina pectoris: Secondary | ICD-10-CM

## 2023-08-28 DIAGNOSIS — E118 Type 2 diabetes mellitus with unspecified complications: Secondary | ICD-10-CM

## 2023-08-28 DIAGNOSIS — E785 Hyperlipidemia, unspecified: Secondary | ICD-10-CM

## 2023-09-05 ENCOUNTER — Ambulatory Visit: Admitting: Family Medicine

## 2023-09-05 ENCOUNTER — Telehealth: Payer: Self-pay | Admitting: Family Medicine

## 2023-09-05 NOTE — Telephone Encounter (Signed)
Wife aware and verbalizes understanding per dpr.  °

## 2023-09-05 NOTE — Telephone Encounter (Signed)
 Called patient to reschedule appointment with PCP, and patient states he wants to see someone else.  I explained we would need to get permission to put him with a different PCP.  He would like to see [employer].   Tiffany / Moira Andrews are you okay with this change?

## 2023-09-06 ENCOUNTER — Ambulatory Visit: Admitting: Family Medicine

## 2023-09-26 DIAGNOSIS — H93293 Other abnormal auditory perceptions, bilateral: Secondary | ICD-10-CM | POA: Diagnosis not present

## 2023-10-08 DIAGNOSIS — E785 Hyperlipidemia, unspecified: Secondary | ICD-10-CM | POA: Diagnosis not present

## 2023-10-08 DIAGNOSIS — I129 Hypertensive chronic kidney disease with stage 1 through stage 4 chronic kidney disease, or unspecified chronic kidney disease: Secondary | ICD-10-CM | POA: Diagnosis not present

## 2023-10-08 DIAGNOSIS — N1832 Chronic kidney disease, stage 3b: Secondary | ICD-10-CM | POA: Diagnosis not present

## 2023-10-08 DIAGNOSIS — E1165 Type 2 diabetes mellitus with hyperglycemia: Secondary | ICD-10-CM | POA: Diagnosis not present

## 2023-10-08 DIAGNOSIS — I10 Essential (primary) hypertension: Secondary | ICD-10-CM | POA: Diagnosis not present

## 2023-10-08 DIAGNOSIS — I255 Ischemic cardiomyopathy: Secondary | ICD-10-CM | POA: Diagnosis not present

## 2023-10-08 DIAGNOSIS — Z9861 Coronary angioplasty status: Secondary | ICD-10-CM | POA: Diagnosis not present

## 2023-10-08 DIAGNOSIS — Z125 Encounter for screening for malignant neoplasm of prostate: Secondary | ICD-10-CM | POA: Diagnosis not present

## 2023-10-08 DIAGNOSIS — I2510 Atherosclerotic heart disease of native coronary artery without angina pectoris: Secondary | ICD-10-CM | POA: Diagnosis not present

## 2023-10-08 DIAGNOSIS — R569 Unspecified convulsions: Secondary | ICD-10-CM | POA: Diagnosis not present

## 2023-10-08 DIAGNOSIS — I509 Heart failure, unspecified: Secondary | ICD-10-CM | POA: Diagnosis not present

## 2023-10-31 DIAGNOSIS — S0101XA Laceration without foreign body of scalp, initial encounter: Secondary | ICD-10-CM | POA: Diagnosis not present

## 2023-11-14 DIAGNOSIS — H903 Sensorineural hearing loss, bilateral: Secondary | ICD-10-CM | POA: Diagnosis not present

## 2023-11-19 DIAGNOSIS — E1165 Type 2 diabetes mellitus with hyperglycemia: Secondary | ICD-10-CM | POA: Diagnosis not present

## 2023-11-19 DIAGNOSIS — I255 Ischemic cardiomyopathy: Secondary | ICD-10-CM | POA: Diagnosis not present

## 2023-11-19 DIAGNOSIS — Z9861 Coronary angioplasty status: Secondary | ICD-10-CM | POA: Diagnosis not present

## 2023-11-19 DIAGNOSIS — I2510 Atherosclerotic heart disease of native coronary artery without angina pectoris: Secondary | ICD-10-CM | POA: Diagnosis not present

## 2023-11-19 DIAGNOSIS — R600 Localized edema: Secondary | ICD-10-CM | POA: Diagnosis not present

## 2023-11-19 DIAGNOSIS — I10 Essential (primary) hypertension: Secondary | ICD-10-CM | POA: Diagnosis not present

## 2023-11-19 DIAGNOSIS — I130 Hypertensive heart and chronic kidney disease with heart failure and stage 1 through stage 4 chronic kidney disease, or unspecified chronic kidney disease: Secondary | ICD-10-CM | POA: Diagnosis not present

## 2023-11-19 DIAGNOSIS — I509 Heart failure, unspecified: Secondary | ICD-10-CM | POA: Diagnosis not present

## 2023-11-19 DIAGNOSIS — G629 Polyneuropathy, unspecified: Secondary | ICD-10-CM | POA: Diagnosis not present

## 2023-11-19 DIAGNOSIS — N1832 Chronic kidney disease, stage 3b: Secondary | ICD-10-CM | POA: Diagnosis not present

## 2023-11-20 NOTE — Telephone Encounter (Signed)
 Called patient's wife Angie for follow-up on referral for hearing aid consult.  Originally was pending for patient to contact [place] [employer] and [employer] for the deaf and hard of hearing wife Mercy reports that she has called that number and confirmed that is within network with insurance.  Will place referral order today for hearing aid new at audiology at [facility] health in [place], [place].  Advised Angie that she should call ahead to schedule an appointment for her husband to ensure that she has a date and time for this visit.

## 2023-11-29 DIAGNOSIS — N183 Chronic kidney disease, stage 3 unspecified: Secondary | ICD-10-CM | POA: Diagnosis not present

## 2023-11-29 DIAGNOSIS — E875 Hyperkalemia: Secondary | ICD-10-CM | POA: Diagnosis not present

## 2023-11-29 DIAGNOSIS — I255 Ischemic cardiomyopathy: Secondary | ICD-10-CM | POA: Diagnosis not present

## 2023-11-29 DIAGNOSIS — I129 Hypertensive chronic kidney disease with stage 1 through stage 4 chronic kidney disease, or unspecified chronic kidney disease: Secondary | ICD-10-CM | POA: Diagnosis not present

## 2023-11-29 DIAGNOSIS — E1129 Type 2 diabetes mellitus with other diabetic kidney complication: Secondary | ICD-10-CM | POA: Diagnosis not present

## 2023-11-29 DIAGNOSIS — E872 Acidosis, unspecified: Secondary | ICD-10-CM | POA: Diagnosis not present

## 2024-01-01 ENCOUNTER — Telehealth: Payer: Self-pay | Admitting: *Deleted

## 2024-01-01 DIAGNOSIS — I509 Heart failure, unspecified: Secondary | ICD-10-CM

## 2024-01-01 DIAGNOSIS — Z794 Long term (current) use of insulin: Secondary | ICD-10-CM

## 2024-01-01 DIAGNOSIS — N183 Chronic kidney disease, stage 3 unspecified: Secondary | ICD-10-CM

## 2024-01-01 NOTE — Progress Notes (Signed)
 Complex Care Management Note Care Guide Note  01/01/2024 Name: [name] MRN: [id number] DOB: 1970-11-14   Complex Care Management Outreach Attempts: An unsuccessful telephone outreach was attempted today to offer the patient information about available complex care management services.  Follow Up Plan:  Additional outreach attempts will be made to offer the patient complex care management information and services.   Encounter Outcome:  No Answer  Harlene Satterfield  [facility]  [facility], [facility] Guide  Direct Dial: [phone]  Fax [phone]

## 2024-01-03 NOTE — Progress Notes (Signed)
 Complex Care Management Note Care Guide Note  01/03/2024 Name: [name] MRN: [id number] DOB: 1971/01/18   Complex Care Management Outreach Attempts: A second unsuccessful outreach was attempted today to offer the patient with information about available complex care management services.  Follow Up Plan:  Additional outreach attempts will be made to offer the patient complex care management information and services.   Encounter Outcome:  No Answer  Harlene Satterfield  [facility]  [facility], [facility] Guide  Direct Dial: [phone]  Fax [phone]

## 2024-01-07 NOTE — Progress Notes (Signed)
 Complex Care Management Note Care Guide Note  01/07/2024 Name: Myrtle Barnhard MRN: [id number] DOB: [birth year]   Complex Care Management Outreach Attempts: A third unsuccessful outreach was attempted today to offer the patient with information about available complex care management services.  Follow Up Plan:  No further outreach attempts will be made at this time. We have been unable to contact the patient to offer or enroll patient in complex care management services.  Encounter Outcome:  No Answer  Harlene Satterfield  [facility]  [facility], [facility] Guide  Direct Dial: [phone]  Fax [phone]

## 2024-01-08 ENCOUNTER — Telehealth: Payer: Self-pay | Admitting: *Deleted

## 2024-01-08 NOTE — Progress Notes (Signed)
 Complex Care Management Note  Care Guide Note 01/08/2024 Name: Hrishikesh Hoeg MRN: [id number] DOB: June 15, 1970  Camauri Craton is a 53 y.o. year old male who sees Joesph Annabella HERO, FNP for primary care. I reached out to Franky Sink by phone today to offer complex care management services.  Mr. Phillips was given information about Complex Care Management services today including:   The Complex Care Management services include support from the care team which includes your Nurse Care Manager, Clinical Social Worker, or Pharmacist.  The Complex Care Management team is here to help remove barriers to the health concerns and goals most important to you. Complex Care Management services are voluntary, and the patient may decline or stop services at any time by request to their care team member.   Complex Care Management Consent Status: Patient agreed to services and verbal consent obtained.   Follow up plan:  Telephone appointment with complex care management team member scheduled for:  01/21/24  Encounter Outcome:  Patient Scheduled  Harlene Satterfield  [facility]  [facility], [facility] Guide  Direct Dial: [phone]  Fax [phone]

## 2024-01-21 ENCOUNTER — Encounter: Payer: Self-pay | Admitting: *Deleted

## 2024-01-21 ENCOUNTER — Telehealth: Payer: Self-pay | Admitting: *Deleted

## 2024-01-21 NOTE — Patient Instructions (Signed)
 Franky Sink - I am sorry I was unable to reach you today for our scheduled appointment. I work with Joesph Annabella HERO, FNP and am calling to support your healthcare needs. Please contact me at [phone] at your earliest convenience. I look forward to speaking with you soon.   Thank you,  Rosina Forte, BSN RN [facility], [facility] RN Care Manager Direct Dial: [phone]  Fax: [phone]

## 2024-01-29 ENCOUNTER — Telehealth: Payer: Self-pay | Admitting: *Deleted

## 2024-01-29 NOTE — Progress Notes (Unsigned)
 Complex Care Management Care Guide Note  01/29/2024 Name: [name] MRN: [id number] DOB: 02/08/1971  [name] is a 53 y.o. year old male who is a primary care patient of No primary care provider on file. and is actively engaged with the care management team. I reached out to Franky Sink by phone today to assist with re-scheduling  with the RN Case Manager.  Follow up plan: Unsuccessful telephone outreach attempt made. A HIPAA compliant phone message was left for the patient providing contact information and requesting a return call.  Thedford Franks, CMA [place]  [facility], [facility] Guide Direct Dial: [phone]  Fax: [phone] Website: [email or web address]

## 2024-01-30 NOTE — Progress Notes (Signed)
 Complex Care Management Care Guide Note  01/30/2024 Name: Gedalia Mcmillon MRN: [id number] DOB: 1970/11/21  Crockett Rallo is a 53 y.o. year old male who is a primary care patient of No primary care provider on file. and is actively engaged with the care management team. I reached out to Franky Sink by phone today to assist with re-scheduling  with the RN Case Manager.  Follow up plan: Unsuccessful telephone outreach attempt made. A HIPAA compliant phone message was left for the patient providing contact information and requesting a return call. No further outreach attempts will be made due to inability to maintain patient contact.   Thedford Franks, CMA [place]  [facility], [facility] Guide Direct Dial: [phone]  Fax: [phone] Website: [email or web address]

## 2024-02-13 ENCOUNTER — Other Ambulatory Visit: Payer: Self-pay | Admitting: Neurology

## 2024-02-13 ENCOUNTER — Telehealth: Payer: Self-pay | Admitting: Neurology

## 2024-02-13 MED ORDER — LAMOTRIGINE 150 MG PO TABS: 150.0000 mg | ORAL_TABLET | Freq: Two times a day (BID) | ORAL | 11 refills | Status: AC

## 2024-02-13 NOTE — Telephone Encounter (Signed)
 Pt's son(on DPR) called to report that yesterday morning and last night pt had a starring seizure , both lasting 6 mins each.  Pt would come out of it as if nothing happened. Son said he called last night and spoke with On call Dr.  He said he was told to just call back and make an appointment in the morning, pt has not been to the ED

## 2024-02-13 NOTE — Telephone Encounter (Signed)
 Late entry: patient's son called on 02/12/24 to report 2 recent staring spells. I spoke to the son. He reported 2 recent staring spells lasting minutes, but per wife, patient has had shaking spells before falling asleep. Per son, who asked pt's wife in the background, pt has not been taking his Keppra . Patient previously had an EEG at [facility] which did not show and seizures. They were advised to make a FU appt and encourage pt to be compliant with current meds.  Son demonstrated understanding and agreement.  Chart reviewed: pt schedule for FU with AL in Dec.

## 2024-02-13 NOTE — Telephone Encounter (Signed)
 Dr. Vear- any other recommendations prior to appt?

## 2024-02-13 NOTE — Telephone Encounter (Signed)
 I called son,  pt has has 2 starring spells yesterday (once in car 1000) then again 2100 (lasting about 6 min.  Son learned that pts wife, relayed that he has been having jerking movements (sz?) prior to going to sleep.  He could not tell me how many he has been having or duration. No other triggers.  He is not been taking keppra  as ordered.  He said it makes him drowsy.  I told him that the need to start taking keppra  750mg  po bid and then the lamotrigine  100mg  po bid.  (I told him that Dr Vear had increased the lamotrigine  to 150mg  po bid), but if not compliant with keppra  then going back to original dosing (where he was doing well no seizures).  I made appt with Greig Forbes, NP at 03/24/2024 at 1000.  Son verbalized understanding of plan and if was changed then would call and let him know.

## 2024-03-23 NOTE — Patient Instructions (Incomplete)
 Below is our plan:  We will ***  Please make sure you are consistent with timing of seizure medication. I recommend annual visit with primary care provider (PCP) for complete physical and routine blood work. I recommend daily intake of vitamin D (400-800iu) and calcium (800-1000mg ) for bone health. Discuss Dexa screening with PCP.   According to [place] law, you can not drive unless you are seizure / syncope free for at least 6 months and under physician's care.  Please maintain precautions. Do not participate in activities where a loss of awareness could harm you or someone else. No swimming alone, no tub bathing, no hot tubs, no driving, no operating motorized vehicles (cars, ATVs, motocycles, etc), lawnmowers, power tools or firearms. No standing at heights, such as rooftops, ladders or stairs. Avoid hot objects such as stoves, heaters, open fires. Wear a helmet when riding a bicycle, scooter, skateboard, etc. and avoid areas of traffic. Set your water heater to 120 degrees or less.  SUDEP is the sudden, unexpected death of someone with epilepsy, who was otherwise healthy. In SUDEP cases, no other cause of death is found when an autopsy is done. Each year, more than 1 in 1,000 people with epilepsy die from SUDEP. This is the leading cause of death in people with uncontrolled seizures. Until further answers are available, the best way to prevent SUDEP is to lower your risk by controlling seizures. Research has found that people with all types of epilepsy that experience convulsive seizures can be at risk.  Please make sure you are staying well hydrated. I recommend 50-60 ounces daily. Well balanced diet and regular exercise encouraged. Consistent sleep schedule with 6-8 hours recommended.   Please continue follow up with care team as directed.   Follow up with *** in ***  You may receive a survey regarding today's visit. I encourage you to leave honest feed back as I do use this information to improve  patient care. Thank you for seeing me today!

## 2024-03-23 NOTE — Progress Notes (Deleted)
 "   No chief complaint on file.   HISTORY OF PRESENT ILLNESS:  03/23/2024 ALL:  [name] is a 53 y.o. male here today for follow up for seizures. He continues lev 750mg  and lamotrigine  150mg  BID.    HISTORY (copied from Dr Duncan previous note)  [name] is a 53 y.o. man with seizures.   Update 03/06/2023   He continued to have some seizures on levetiracetam  500 mg p.o. twice daily at and I increased him to 750 p.o. twice daily.  However, the higher dose made him sleepy and more off balance.  He stopped the medication.  He has had one GTC spell witnessed by his son recently, since stopping.SABRA  He told his son, he felt strange right before the spell.     He was unresponsive for a few minutes and then was groggy a few more minutes.      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  It was not present on MRI scan in 2021 or in 2022 on the CT scan.  He also has chronic microvascular ischemic change.   He has multiple medical issues including kidney disease, diabetes, hypertension.  Blood pressure was elevated today.  Of note, he skips his blood pressure medication at times.  He did not take his amlodipine  this morning.     H/O seizures He had his first convulsive seizure in 2021.  At the time he was taken to the emergency room.  CT and MRI showed no acute findings.  By my review, he did have more than typical white matter changes consistent with chronic microvascular ischemic change on the MRI.  Since then he has had frequent spells of staring spell during the day.  Additionally, he has had frequent convulsive episode right before he goes to sleep while laying in bed.  These last about one minute.    Convulsive episodes are very rare at any other time.  He usually groggy for a minute or 2 after the spell and then becomes alert again.   He had  an episode of LOC, followed by gasping and respiratory arrest 11/14/2022.   CPR was initiated and he was taken to [facility] and then transferred to [facility].  The 3-4 hour EEG/video was normal 11/14/2022 showing wakefulness and drowsiness but no seizures.     According to his notes, he was reported to be taking his outpatient medications irregularly at the time.       Other: He has CHF and is on Imdur , Losartan  and Coreg .  EF% is 30-35%.  He also has CAD, NIDDM and Stge 3 CRF   REVIEW OF SYSTEMS: Out of a complete 14 system review of symptoms, the patient complains only of the following symptoms, and all other reviewed systems are negative.   ALLERGIES: Allergies[1]   HOME MEDICATIONS: Outpatient Medications Prior to Visit  Medication Sig Dispense Refill   Accu-Chek Softclix Lancets lancets Test BS QID Dx E11.9 400 each 3   albuterol  (VENTOLIN  HFA) 108 (90 Base) MCG/ACT inhaler Inhale 1-2 puffs into the lungs every 6 (six) hours as needed.     amLODipine  (NORVASC ) 5 MG tablet Take 1 tablet (5 mg total) by mouth daily. 90 tablet 3   aspirin  81 MG chewable tablet Chew 1 tablet (81 mg total) by mouth daily with breakfast.  120 tablet 2   carvedilol  (COREG ) 3.125 MG tablet Take 1 tablet (3.125 mg total) by mouth 2 (two) times daily. 180 tablet 3   citalopram  (CELEXA ) 10 MG tablet Take 1 tablet (10 mg total) by mouth daily. 90 tablet 1   Continuous Blood Gluc Receiver (FREESTYLE LIBRE 2 READER) DEVI 1 each by Does not apply route daily. 1 each 0   Continuous Blood Gluc Sensor (FREESTYLE LIBRE 2 SENSOR) MISC 1 each by Does not apply route every 14 (fourteen) days. 2 each 2   dapagliflozin  propanediol (FARXIGA ) 10 MG TABS tablet Take 10 mg by mouth daily.     ezetimibe  (ZETIA ) 10 MG tablet Take 1 tablet (10 mg total) by mouth daily. 90 tablet 3   fluticasone  (FLONASE ) 50 MCG/ACT nasal spray Place 2 sprays into both nostrils daily. 16 g 6   gabapentin  (NEURONTIN ) 300 MG capsule  TAKE ONE CAPSULE BY MOUTH THREE TIMES DAILY 180 capsule 0   glucose blood (ACCU-CHEK GUIDE) test strip Test BS QID Dx E11.9 400 each 3   insulin  aspart (NOVOLOG ) 100 UNIT/ML FlexPen Inject 0-10 Units into the skin See admin instructions. insulin  aspart (novoLOG ) injection 0-10 Units 0-10 Units Subcutaneous, 3 times daily with meals CBG < 70: Implement Hypoglycemia Standing Orders and refer to Hypoglycemia Standing Orders sidebar report  CBG 70 - 120: 0 unit CBG 121 - 150: 0 unit  CBG 151 - 200: 1 unit CBG 201 - 250: 2 units CBG 251 - 300: 4 units CBG 301 - 350: 6 units  CBG 351 - 400: 8 units  CBG > 400: 10 units 15 mL 0   insulin  glargine (LANTUS ) 100 UNIT/ML injection Inject 0.18 mLs (18 Units total) into the skin at bedtime. 10 mL 0   Insulin  Pen Needle (PEN NEEDLES) 32G X 5 MM MISC Use with Novolog  Flexpen as directed 100 each 3   Insulin  Syringe-Needle U-100 (TRUEPLUS INSULIN  SYRINGE) 31G X 5/16 1 ML MISC Use to inject insulin  once daily Dx E11.8 100 each 3   isosorbide  mononitrate (IMDUR ) 120 MG 24 hr tablet Take 120 mg by mouth daily.     lamoTRIgine  (LAMICTAL ) 150 MG tablet Take 1 tablet (150 mg total) by mouth 2 (two) times daily. 60 tablet 11   levETIRAcetam  (KEPPRA ) 750 MG tablet Take 1 tablet (750 mg total) by mouth 2 (two) times daily. 60 tablet 11   levocetirizine (XYZAL ) 5 MG tablet Take 1 tablet (5 mg total) by mouth every evening. 30 tablet 2   nitroGLYCERIN  (NITROSTAT ) 0.4 MG SL tablet Place 1 tablet (0.4 mg total) under the tongue every 5 (five) minutes as needed for chest pain. 25 tablet 4   olmesartan  (BENICAR ) 40 MG tablet Take 1 tablet (40 mg total) by mouth daily. 90 tablet 3   rosuvastatin  (CRESTOR ) 40 MG tablet Take 1 tablet (40 mg total) by mouth daily. 90 tablet 3   sitaGLIPtin  (JANUVIA ) 50 MG tablet Take 1 tablet (50 mg total) by mouth daily. 90 tablet 3   No facility-administered medications prior to visit.     PAST MEDICAL HISTORY: Past Medical History:  Diagnosis  Date   Alcohol abuse    CAD (coronary artery disease), native coronary artery    a. 01/2009 s/p Anterior MI and thrombectomy with BMS to mid LAD;  b. 06/2016 Cath/PCI: LM nl, LAD 60m/d, LCX nl, OM1/2/3 nl, RDA 37m. Initial attempt @ PCI failed followed by successful CTO LAD PCI and DES (2.5x38 Synergy)-->rec for indefinte DAPT.  Chronic congestive heart failure (HCC) 12/07/2022   CKD (chronic kidney disease), stage III (HCC)    Essential hypertension    History of nonadherence to medical treatment    Hyperlipidemia    Ischemic cardiomyopathy    a. EF 40% by nuc in 2012;  b. 06/2016 Echo: EF 40-45%, apicalanteroseptal and apical AK, Gr2 DD, mod Ca2+ Ao annulus, mild MR.   Slurred speech - Chronic    Type 2 diabetes mellitus (HCC)      PAST SURGICAL HISTORY: Past Surgical History:  Procedure Laterality Date   CORONARY CTO INTERVENTION N/A 07/11/2016   Procedure: Coronary CTO Intervention;  Surgeon: Peter M Jordan, MD;  Location: [facility] INVASIVE CV LAB;  Service: Cardiovascular;  Laterality: N/A;   CORONARY STENT INTERVENTION N/A 07/09/2016   Procedure: Coronary Stent Intervention;  Surgeon: Dorn JINNY Lesches, MD;  Location: [facility] INVASIVE CV LAB;  Service: Cardiovascular;  Laterality: N/A;   CORONARY STENT INTERVENTION N/A 04/24/2019   Procedure: CORONARY STENT INTERVENTION;  Surgeon: Anner Alm ORN, MD;  Location: [facility] INVASIVE CV LAB;  Service: Cardiovascular;  Laterality: N/A;   Knee arthroscopic surgery Right    LEFT HEART CATH AND CORONARY ANGIOGRAPHY N/A 07/05/2016   Procedure: Left Heart Cath and Coronary Angiography;  Surgeon: Victory ORN Sharps, MD;  Location: [facility] INVASIVE CV LAB;  Service: Cardiovascular;  Laterality: N/A;   LEFT HEART CATH AND CORONARY ANGIOGRAPHY N/A 04/24/2019   Procedure: LEFT HEART CATH AND CORONARY ANGIOGRAPHY;  Surgeon: Anner Alm ORN, MD;  Location: [facility] INVASIVE CV LAB;  Service: Cardiovascular;  Laterality: N/A;   LEFT HEART CATH AND CORONARY ANGIOGRAPHY N/A 07/06/2021    Procedure: LEFT HEART CATH AND CORONARY ANGIOGRAPHY;  Surgeon: Jordan, Peter M, MD;  Location: [facility] INVASIVE CV LAB;  Service: Cardiovascular;  Laterality: N/A;     FAMILY HISTORY: Family History  Problem Relation Age of Onset   Diabetes Mellitus II Other    Heart disease Mother      SOCIAL HISTORY: Social History   Socioeconomic History   Marital status: Married    Spouse name: Not on file   Number of children: Not on file   Years of education: Not on file   Highest education level: Not on file  Occupational History   Not on file  Tobacco Use   Smoking status: Never   Smokeless tobacco: Never  Vaping Use   Vaping status: Never Used  Substance and Sexual Activity   Alcohol use: No    Alcohol/week: 1.0 standard drink of alcohol    Types: 1 Cans of beer per week   Drug use: No   Sexual activity: Yes  Other Topics Concern   Not on file  Social History Narrative   Married with one child   Lives in [place], [place]   Right Handed   One story home   Drinks some caffeine (Green Tea)         Right handed    Drinks coffee sometimes    Drinks sodas one per day   Social Drivers of Health   Tobacco Use: Low Risk (11/19/2023)   Received from [facility]   Patient History    Smoking Tobacco Use: Never    Smokeless Tobacco Use: Never    Passive Exposure: Not on file  Financial Resource Strain: Not on file  Food Insecurity: Low Risk (11/19/2023)   Received from [facility]   Epic    Within the past 12 months, you worried that your food would run out  before you got money to buy more: Never true    Within the past 12 months, the food you bought just didn't last and you didn't have money to get more. : Never true  Transportation Needs: No Transportation Needs (11/19/2023)   Received from [facility]    In the past 12 months, has lack of reliable transportation kept you from medical appointments, meetings, work or from getting things needed for daily living?  : No  Physical Activity: Not on file  Stress: No Stress Concern Present (11/13/2022)   Received from [facility] of Occupational Health - Occupational Stress Questionnaire    Feeling of Stress : Not at all  Social Connections: Not on file  Intimate Partner Violence: Not At Risk (11/13/2022)   Received from [facility]   HITS    Over the last 12 months how often did your partner physically hurt you?: Never    Over the last 12 months how often did your partner insult you or talk down to you?: Never    Over the last 12 months how often did your partner threaten you with physical harm?: Never    Over the last 12 months how often did your partner scream or curse at you?: Never  Depression (PHQ2-9): Low Risk (02/25/2023)   Depression (PHQ2-9)    PHQ-2 Score: 0  Alcohol Screen: Not on file  Housing: Low Risk (11/19/2023)   Received from [facility]   Epic    What is your living situation today?: I have a steady place to live    Think about the place you live. Do you have problems with any of the following? Choose all that apply:: None/None on this list  Utilities: Low Risk (11/19/2023)   Received from [facility]   Utilities    In the past 12 months has the electric, gas, oil, or water company threatened to shut off services in your home? : No  Health Literacy: Not on file     PHYSICAL EXAM  There were no vitals filed for this visit. There is no height or weight on file to calculate BMI.  Generalized: Well developed, in no acute distress  Cardiology: normal rate and rhythm, no murmur auscultated  Respiratory: clear to auscultation bilaterally    Neurological examination  Mentation: Alert oriented to time, place, history taking. Follows all commands speech and language fluent Cranial nerve II-XII: Pupils were equal round reactive to light. Extraocular movements were full, visual field were full on confrontational test. Facial sensation and strength were  normal. Uvula tongue midline. Head turning and shoulder shrug  were normal and symmetric. Motor: The motor testing reveals 5 over 5 strength of all 4 extremities. Good symmetric motor tone is noted throughout.  Sensory: Sensory testing is intact to soft touch on all 4 extremities. No evidence of extinction is noted.  Coordination: Cerebellar testing reveals good finger-nose-finger and heel-to-shin bilaterally.  Gait and station: Gait is normal. Tandem gait is normal. Romberg is negative. No drift is seen.  Reflexes: Deep tendon reflexes are symmetric and normal bilaterally.    DIAGNOSTIC DATA (LABS, IMAGING, TESTING) - I reviewed patient records, labs, notes, testing and imaging myself where available.  Lab Results  Component Value Date   WBC 8.9 12/07/2022   HGB 14.8 12/07/2022   HCT 45.3 12/07/2022   MCV 85 12/07/2022   PLT 227 12/07/2022      Component Value Date/Time   NA 138 01/11/2023 1511  K 5.4 (H) 01/11/2023 1511   CL 106 01/11/2023 1511   CO2 17 (L) 01/11/2023 1511   GLUCOSE 144 (H) 01/11/2023 1511   GLUCOSE 158 (H) 09/12/2019 1636   BUN 19 01/11/2023 1511   CREATININE 2.16 (H) 01/11/2023 1511   CREATININE 1.79 (H) 05/19/2019 1357   CALCIUM  9.1 01/11/2023 1511   PROT 7.1 12/07/2022 0958   ALBUMIN 4.6 12/07/2022 0958   AST 18 12/07/2022 0958   ALT 14 12/07/2022 0958   ALKPHOS 94 12/07/2022 0958   BILITOT 0.5 12/07/2022 0958   GFRNONAA 44 (L) 05/10/2020 0927   GFRNONAA 44 (L) 05/19/2019 1357   GFRAA 51 (L) 05/10/2020 0927   GFRAA 51 (L) 05/19/2019 1357   Lab Results  Component Value Date   CHOL 176 02/25/2023   HDL 39 (L) 02/25/2023   LDLCALC 110 (H) 02/25/2023   TRIG 150 (H) 02/25/2023   CHOLHDL 4.5 02/25/2023   Lab Results  Component Value Date   HGBA1C 7.6 (H) 02/25/2023   No results found for: VITAMINB12 Lab Results  Component Value Date   TSH 3.340 12/07/2022       05/25/2022   10:00 AM  MMSE - Mini Mental State Exam  Orientation to time 5   Orientation to Place 5  Registration 3  Attention/ Calculation 5  Recall 2  Language- name 2 objects 2  Language- repeat 1  Language- follow 3 step command 3  Language- read & follow direction 1  Write a sentence 1  Copy design 0  Total score 28         No data to display           ASSESSMENT AND PLAN  53 y.o. year old male  has a past medical history of Alcohol abuse, CAD (coronary artery disease), native coronary artery, Chronic congestive heart failure (HCC) (12/07/2022), CKD (chronic kidney disease), stage III (HCC), Essential hypertension, History of nonadherence to medical treatment, Hyperlipidemia, Ischemic cardiomyopathy, Slurred speech - Chronic, and Type 2 diabetes mellitus (HCC). here with    No diagnosis found.  Franky Sink ***.  Healthy lifestyle habits encouraged. *** will follow up with PCP as directed. *** will return to see me in ***, sooner if needed. *** verbalizes understanding and agreement with this plan.   No orders of the defined types were placed in this encounter.    No orders of the defined types were placed in this encounter.    Greig Forbes, MSN, FNP-C 03/23/2024, 4:27 PM  [facility] [address] [place], [place] [address] [phone]     [1]  Allergies Allergen Reactions   Penicillins Rash        "

## 2024-03-24 ENCOUNTER — Encounter: Payer: Self-pay | Admitting: Family Medicine

## 2024-03-24 ENCOUNTER — Ambulatory Visit: Admitting: Family Medicine

## 2024-03-24 DIAGNOSIS — R569 Unspecified convulsions: Secondary | ICD-10-CM

## 2024-04-08 NOTE — Telephone Encounter (Signed)
 Contacted son to schedule appointment on 11/18/24 at 3:00pm

## 2024-11-18 ENCOUNTER — Ambulatory Visit: Admitting: Neurology
